# Patient Record
Sex: Male | Born: 1935 | Race: White | Hispanic: No | Marital: Married | Smoking: Former smoker
Health system: Southern US, Community
[De-identification: ages and names within clinical notes are randomized; demographics above are authoritative.]

## PROBLEM LIST (undated history)

## (undated) DIAGNOSIS — R7303 Prediabetes: Secondary | ICD-10-CM

## (undated) DIAGNOSIS — I701 Atherosclerosis of renal artery: Secondary | ICD-10-CM

## (undated) DIAGNOSIS — Z8673 Personal history of transient ischemic attack (TIA), and cerebral infarction without residual deficits: Secondary | ICD-10-CM

## (undated) DIAGNOSIS — I2510 Atherosclerotic heart disease of native coronary artery without angina pectoris: Secondary | ICD-10-CM

## (undated) DIAGNOSIS — Z95810 Presence of automatic (implantable) cardiac defibrillator: Secondary | ICD-10-CM

## (undated) DIAGNOSIS — I428 Other cardiomyopathies: Secondary | ICD-10-CM

## (undated) DIAGNOSIS — J9690 Respiratory failure, unspecified, unspecified whether with hypoxia or hypercapnia: Secondary | ICD-10-CM

## (undated) DIAGNOSIS — N184 Chronic kidney disease, stage 4 (severe): Secondary | ICD-10-CM

## (undated) DIAGNOSIS — G459 Transient cerebral ischemic attack, unspecified: Secondary | ICD-10-CM

## (undated) DIAGNOSIS — G4733 Obstructive sleep apnea (adult) (pediatric): Secondary | ICD-10-CM

## (undated) DIAGNOSIS — I447 Left bundle-branch block, unspecified: Secondary | ICD-10-CM

## (undated) DIAGNOSIS — E785 Hyperlipidemia, unspecified: Secondary | ICD-10-CM

## (undated) DIAGNOSIS — I779 Disorder of arteries and arterioles, unspecified: Secondary | ICD-10-CM

## (undated) DIAGNOSIS — I739 Peripheral vascular disease, unspecified: Secondary | ICD-10-CM

## (undated) DIAGNOSIS — I5042 Chronic combined systolic (congestive) and diastolic (congestive) heart failure: Secondary | ICD-10-CM

## (undated) DIAGNOSIS — I110 Hypertensive heart disease with heart failure: Secondary | ICD-10-CM

## (undated) HISTORY — DX: Left bundle-branch block, unspecified: I44.7

## (undated) HISTORY — DX: Hyperlipidemia, unspecified: E78.5

## (undated) HISTORY — DX: Disorder of arteries and arterioles, unspecified: I77.9

## (undated) HISTORY — PX: HERNIA REPAIR: SHX51

## (undated) HISTORY — DX: Other cardiomyopathies: I42.8

## (undated) HISTORY — DX: Personal history of transient ischemic attack (TIA), and cerebral infarction without residual deficits: Z86.73

## (undated) HISTORY — DX: Atherosclerosis of renal artery: I70.1

## (undated) HISTORY — DX: Presence of automatic (implantable) cardiac defibrillator: Z95.810

## (undated) HISTORY — DX: Atherosclerotic heart disease of native coronary artery without angina pectoris: I25.10

## (undated) HISTORY — DX: Obstructive sleep apnea (adult) (pediatric): G47.33

## (undated) HISTORY — DX: Peripheral vascular disease, unspecified: I73.9

---

## 1995-01-09 HISTORY — PX: HAND SURGERY: SHX662

## 2001-05-10 HISTORY — PX: CARDIAC CATHETERIZATION: SHX172

## 2001-11-19 ENCOUNTER — Inpatient Hospital Stay (HOSPITAL_COMMUNITY): Admission: AD | Admit: 2001-11-19 | Discharge: 2001-11-21 | Payer: Self-pay | Admitting: Cardiovascular Disease

## 2001-12-21 ENCOUNTER — Encounter: Payer: Self-pay | Admitting: Cardiovascular Disease

## 2001-12-21 ENCOUNTER — Ambulatory Visit (HOSPITAL_COMMUNITY): Admission: RE | Admit: 2001-12-21 | Discharge: 2001-12-22 | Payer: Self-pay | Admitting: Cardiovascular Disease

## 2005-10-12 ENCOUNTER — Encounter: Admission: RE | Admit: 2005-10-12 | Discharge: 2005-10-12 | Payer: Self-pay | Admitting: Cardiovascular Disease

## 2005-10-18 ENCOUNTER — Inpatient Hospital Stay (HOSPITAL_COMMUNITY): Admission: RE | Admit: 2005-10-18 | Discharge: 2005-10-19 | Payer: Self-pay | Admitting: Cardiovascular Disease

## 2005-10-18 HISTORY — PX: CARDIAC CATHETERIZATION: SHX172

## 2010-06-10 DIAGNOSIS — G459 Transient cerebral ischemic attack, unspecified: Secondary | ICD-10-CM

## 2010-06-10 HISTORY — DX: Transient cerebral ischemic attack, unspecified: G45.9

## 2011-04-22 DIAGNOSIS — M109 Gout, unspecified: Secondary | ICD-10-CM | POA: Insufficient documentation

## 2012-09-12 DIAGNOSIS — G4700 Insomnia, unspecified: Secondary | ICD-10-CM | POA: Insufficient documentation

## 2012-09-25 DIAGNOSIS — I129 Hypertensive chronic kidney disease with stage 1 through stage 4 chronic kidney disease, or unspecified chronic kidney disease: Secondary | ICD-10-CM | POA: Insufficient documentation

## 2012-11-06 ENCOUNTER — Other Ambulatory Visit (HOSPITAL_COMMUNITY): Payer: Self-pay | Admitting: Cardiovascular Disease

## 2012-11-06 ENCOUNTER — Telehealth (HOSPITAL_COMMUNITY): Payer: Self-pay | Admitting: Cardiovascular Disease

## 2012-11-06 DIAGNOSIS — I701 Atherosclerosis of renal artery: Secondary | ICD-10-CM

## 2012-11-06 NOTE — Telephone Encounter (Signed)
LEFT MESSAGE WITH PATIENTS WIFE TO CALL BACK AND SCHEDULE Q 6 TESTING PER JB

## 2012-11-08 ENCOUNTER — Encounter: Payer: Self-pay | Admitting: Cardiovascular Disease

## 2012-11-27 ENCOUNTER — Encounter (HOSPITAL_COMMUNITY): Payer: Self-pay

## 2012-12-04 ENCOUNTER — Telehealth (HOSPITAL_COMMUNITY): Payer: Self-pay | Admitting: Cardiovascular Disease

## 2012-12-05 ENCOUNTER — Encounter: Payer: Self-pay | Admitting: Cardiovascular Disease

## 2012-12-14 ENCOUNTER — Ambulatory Visit: Payer: Self-pay | Admitting: Cardiovascular Disease

## 2012-12-15 ENCOUNTER — Ambulatory Visit (HOSPITAL_COMMUNITY)
Admission: RE | Admit: 2012-12-15 | Discharge: 2012-12-15 | Disposition: A | Discharge status: Home / Self Care | Payer: Medicare Other | Source: Ambulatory Visit | Attending: Cardiovascular Disease | Admitting: Cardiovascular Disease

## 2012-12-15 DIAGNOSIS — I701 Atherosclerosis of renal artery: Secondary | ICD-10-CM

## 2012-12-15 NOTE — Progress Notes (Signed)
Renal Artery Duplex Completed. °Scott Parsons ° °

## 2012-12-21 ENCOUNTER — Encounter: Payer: Self-pay | Admitting: *Deleted

## 2012-12-25 ENCOUNTER — Ambulatory Visit: Payer: Self-pay | Admitting: Cardiovascular Disease

## 2013-01-08 DIAGNOSIS — Z8673 Personal history of transient ischemic attack (TIA), and cerebral infarction without residual deficits: Secondary | ICD-10-CM | POA: Insufficient documentation

## 2013-01-08 DIAGNOSIS — E1159 Type 2 diabetes mellitus with other circulatory complications: Secondary | ICD-10-CM | POA: Insufficient documentation

## 2013-01-09 ENCOUNTER — Encounter: Payer: Self-pay | Admitting: Cardiovascular Disease

## 2013-01-10 ENCOUNTER — Ambulatory Visit (INDEPENDENT_AMBULATORY_CARE_PROVIDER_SITE_OTHER): Payer: Medicare Other | Admitting: Cardiovascular Disease

## 2013-01-10 ENCOUNTER — Encounter: Payer: Self-pay | Admitting: Cardiovascular Disease

## 2013-01-10 VITALS — BP 128/52 | HR 62 | Ht 74.0 in | Wt 200.9 lb

## 2013-01-10 DIAGNOSIS — I739 Peripheral vascular disease, unspecified: Secondary | ICD-10-CM

## 2013-01-10 DIAGNOSIS — I779 Disorder of arteries and arterioles, unspecified: Secondary | ICD-10-CM | POA: Insufficient documentation

## 2013-01-10 DIAGNOSIS — I5022 Chronic systolic (congestive) heart failure: Secondary | ICD-10-CM | POA: Insufficient documentation

## 2013-01-10 DIAGNOSIS — E785 Hyperlipidemia, unspecified: Secondary | ICD-10-CM | POA: Insufficient documentation

## 2013-01-10 DIAGNOSIS — I509 Heart failure, unspecified: Secondary | ICD-10-CM

## 2013-01-10 DIAGNOSIS — I701 Atherosclerosis of renal artery: Secondary | ICD-10-CM

## 2013-01-10 DIAGNOSIS — I10 Essential (primary) hypertension: Secondary | ICD-10-CM | POA: Insufficient documentation

## 2013-01-10 NOTE — Assessment & Plan Note (Signed)
Status post bilateral iliac artery stenting. He does have mild claudication. We checked his lower Shumate Dopplers angle basis which were last checked 11/08/11 revealing a right ABI of 0.89 the left of 0.79 with moderately high velocities in both iliac arteries. He does have moderate segmental mid SFA stenosis bilaterally. Will recheck largely Doppler studies

## 2013-01-10 NOTE — Assessment & Plan Note (Signed)
I stented his left renal artery 10/18/05. We've been following his renal Dopplers an annual basis. They were recently checked on 12/15/12 revealing a progression of disease on the left side. We will recheck in 6 months.

## 2013-01-10 NOTE — Progress Notes (Signed)
01/10/2013 Scott Parsons   1929/09/04  [id number]  Primary Physician Scott Qua, MD Primary Cardiologist: Scott Gess MD Scott Parsons   HPI:  The patient is a very pleasant 79 year old, mildly overweight, married Caucasian male, father of 3, grandfather to 7 grandchildren who I saw in the office 1 year ago. He has a history of noncritical CAD by cath which I performed in 2003 with mild LV dysfunction. At that time, he had apical and inferoapical wall motion abnormalities and EF of 45%. He has PVOD with bilateral carotid disease left greater than right which we follow by duplex ultrasound. He is neurologically asymptomatic. I stented his left renal artery October 18, 2005, as well as both iliac arteries. He does have moderate SFA disease bilaterally. He does complain of some hip pain when walking on a treadmill or walking up an incline, but rides a bike without limitation. His other problems include hypertension and hyperlipidemia. His last Myoview performed December 08, 2009, revealed inferior scar. Dr. Mikey Parsons follows his lipid profile. His most recent Dopplers reveal stable internal carotid artery stenosis left greater than right, as well as patent iliac stents with probably mild to moderate in-stent restenosis with ABIs of 0.89 on the right and 0.79 on the left.  Saw him last a/2/13 he has had admissions for congestive heart failure at [facility] in March of this year probably related to dietary indiscretion. He spent today's in the hospital I was direst. He had recent renal Doppler studies that showed progression of disease on left side suggesting "in-stent restenosis. He denies chest pain or shortness of breath now. Does have mild stable claudication.      Current Outpatient Prescriptions  Medication Sig Dispense Refill  . Aclidinium Bromide (TUDORZA PRESSAIR) 400 MCG/ACT AEPB Inhale 1 puff into the lungs daily. Sometimes takes BID      . allopurinol (ZYLOPRIM) 100 MG  tablet Take 100 mg by mouth daily.      [name] aspirin 81 MG tablet Take 81 mg by mouth daily.      . candesartan (ATACAND) 16 MG tablet Take 1 tablet by mouth daily.      . clopidogrel (PLAVIX) 75 MG tablet Take 75 mg by mouth daily.      . furosemide (LASIX) 40 MG tablet Take 60 mg by mouth daily.       . isosorbide dinitrate (ISORDIL) 10 MG tablet Take 20 mg by mouth daily.       [name] lisinopril (PRINIVIL,ZESTRIL) 40 MG tablet Take 40 mg by mouth daily.      . Magnesium 500 MG CAPS Take 1 capsule by mouth daily.      . metoprolol (TOPROL-XL) 200 MG 24 hr tablet Take 1 tablet by mouth daily.      . niacin 500 MG tablet Take 500 mg by mouth daily with breakfast.      . pravastatin (PRAVACHOL) 40 MG tablet Take 40 mg by mouth daily.      [name] zolpidem (AMBIEN) 10 MG tablet Take 5 mg by mouth daily.       No current facility-administered medications for this visit.    Allergies  Allergen Reactions  . Amoxicillin   . Biaxin [Clarithromycin]   . Erythromycin   . Penicillins   . Shellfish Allergy     History   Social History  . Marital Status: Married    Spouse Name: N/A    Number of Children: N/A  . Years of Education: N/A   Occupational  History  . Not on file.   Social History Main Topics  . Smoking status: Former Smoker -- 52 years    Types: Cigarettes    Quit date: 08/11/2004  . Smokeless tobacco: Never Used  . Alcohol Use: No  . Drug Use: No  . Sexual Activity: Not on file   Other Topics Concern  . Not on file   Social History Narrative  . No narrative on file     Review of Systems: General: negative for chills, fever, night sweats or weight changes.  Cardiovascular: negative for chest pain, dyspnea on exertion, edema, orthopnea, palpitations, paroxysmal nocturnal dyspnea or shortness of breath Dermatological: negative for rash Respiratory: negative for cough or wheezing Urologic: negative for hematuria Abdominal: negative for nausea, vomiting, diarrhea, bright red  blood per rectum, melena, or hematemesis Neurologic: negative for visual changes, syncope, or dizziness All other systems reviewed and are otherwise negative except as noted above.    Blood pressure 128/52, pulse 62, height 6\' 2"  (1.88 m), weight 200 lb 14.4 oz (91.128 kg).  General appearance: alert and no distress Neck: no adenopathy, no JVD, supple, symmetrical, trachea midline, thyroid not enlarged, symmetric, no tenderness/mass/nodules and ssoft right carotid bruit Lungs: clear to auscultation bilaterally Heart: regular rate and rhythm, S1, S2 normal, no murmur, click, rub or gallop Extremities: extremities normal, atraumatic, no cyanosis or edema  EKG sinus rhythm at 62 with a nonspecific IVCD and occasional PVCs with repolarization abnormalities unchanged from his prior EKG  ASSESSMENT AND PLAN:   Congestive heart failure The patient was in the [facility] in March with congestive heart failure and was diuresed. He does have a history of moderate left ventricular dysfunction by 2-D echo last checked 9/11/6 with an EF of 35-45%.he does admit to dietary indiscretion regarding salt.  Renal artery stenosis I stented his left renal artery 10/18/05. We've been following his renal Dopplers an annual basis. They were recently checked on 12/15/12 revealing a progression of disease on the left side. We will recheck in 6 months.  Peripheral arterial disease Status post bilateral iliac artery stenting. He does have mild claudication. We checked his lower Shumate Dopplers angle basis which were last checked 11/08/11 revealing a right ABI of 0.89 the left of 0.79 with moderately high velocities in both iliac arteries. He does have moderate segmental mid SFA stenosis bilaterally. Will recheck largely Doppler studies  Carotid artery disease Patient says that he had a TIA February of last year. His last Dopplers performed 11/08/11 revealed mild right and mild to moderate left ICA stenosis. He is on  aspirin and clopidogrel.      Scott Gess MD FACP,FACC,FAHA, [facility] 01/10/2013 5:23 PM

## 2013-01-10 NOTE — Assessment & Plan Note (Signed)
The patient was in the [facility] in March with congestive heart failure and was diuresed. He does have a history of moderate left ventricular dysfunction by 2-D echo last checked 9/11/6 with an EF of 35-45%.he does admit to dietary indiscretion regarding salt.

## 2013-01-10 NOTE — Assessment & Plan Note (Signed)
Patient says that he had a TIA February of last year. His last Dopplers performed 11/08/11 revealed mild right and mild to moderate left ICA stenosis. He is on aspirin and clopidogrel.

## 2013-01-10 NOTE — Patient Instructions (Addendum)
  We will see you back in follow up in 6 months  Dr Allyson Sabal has ordered echocardiogram, carotid doppler, lower extremity arterial doppler in the next 1-2 months  Please have renal dopplers in 6 months.

## 2013-01-11 ENCOUNTER — Telehealth (HOSPITAL_COMMUNITY): Payer: Self-pay | Admitting: Cardiovascular Disease

## 2013-01-15 ENCOUNTER — Encounter (HOSPITAL_COMMUNITY): Payer: Self-pay | Admitting: Cardiovascular Disease

## 2013-01-17 ENCOUNTER — Ambulatory Visit (HOSPITAL_BASED_OUTPATIENT_CLINIC_OR_DEPARTMENT_OTHER)
Admission: RE | Admit: 2013-01-17 | Discharge: 2013-01-17 | Disposition: A | Discharge status: Home / Self Care | Payer: Medicare Other | Source: Ambulatory Visit | Attending: Cardiovascular Disease | Admitting: Cardiovascular Disease

## 2013-01-17 ENCOUNTER — Ambulatory Visit (HOSPITAL_COMMUNITY)
Admission: RE | Admit: 2013-01-17 | Discharge: 2013-01-17 | Disposition: A | Discharge status: Home / Self Care | Payer: Medicare Other | Source: Ambulatory Visit | Attending: Cardiovascular Disease | Admitting: Cardiovascular Disease

## 2013-01-17 DIAGNOSIS — I779 Disorder of arteries and arterioles, unspecified: Secondary | ICD-10-CM

## 2013-01-17 DIAGNOSIS — I739 Peripheral vascular disease, unspecified: Secondary | ICD-10-CM

## 2013-01-17 DIAGNOSIS — I509 Heart failure, unspecified: Secondary | ICD-10-CM | POA: Insufficient documentation

## 2013-01-17 NOTE — Progress Notes (Signed)
Carotid Duplex Completed. °Brianna L Mazza,RVT °

## 2013-01-17 NOTE — Progress Notes (Signed)
2D Echo Performed 01/17/2013    Izzy Courville, RCS  

## 2013-01-25 ENCOUNTER — Ambulatory Visit (HOSPITAL_COMMUNITY)
Admission: RE | Admit: 2013-01-25 | Discharge: 2013-01-25 | Disposition: A | Discharge status: Home / Self Care | Payer: Medicare Other | Source: Ambulatory Visit | Attending: Cardiovascular Disease | Admitting: Cardiovascular Disease

## 2013-01-25 DIAGNOSIS — I70219 Atherosclerosis of native arteries of extremities with intermittent claudication, unspecified extremity: Secondary | ICD-10-CM

## 2013-01-25 DIAGNOSIS — I739 Peripheral vascular disease, unspecified: Secondary | ICD-10-CM

## 2013-01-25 NOTE — Progress Notes (Signed)
Arterial Duplex Lower Ext. Completed. Leathie Weich, BS, RDMS, RVT  

## 2013-02-02 ENCOUNTER — Telehealth: Payer: Self-pay | Admitting: Cardiovascular Disease

## 2013-02-02 DIAGNOSIS — I6529 Occlusion and stenosis of unspecified carotid artery: Secondary | ICD-10-CM

## 2013-02-02 NOTE — Telephone Encounter (Signed)
Order placed for repeat carotid doppler in 1 year 

## 2013-02-02 NOTE — Telephone Encounter (Signed)
Patient is returning your call from yesterday.  Please call new phone # that was given.

## 2013-02-02 NOTE — Telephone Encounter (Signed)
Message copied by Marella Bile on Fri Feb 02, 2013  3:51 PM ------      Message from: [name]      Created: Sun Jan 28, 2013 11:10 AM       No change from prior study. Repeat in 12 months. ------

## 2013-02-02 NOTE — Telephone Encounter (Signed)
Spoke with patient and gave test results

## 2013-02-05 ENCOUNTER — Encounter: Payer: Self-pay | Admitting: Cardiovascular Disease

## 2013-02-05 ENCOUNTER — Ambulatory Visit (INDEPENDENT_AMBULATORY_CARE_PROVIDER_SITE_OTHER): Payer: Medicare Other | Admitting: Cardiovascular Disease

## 2013-02-05 VITALS — BP 130/60 | HR 72 | Ht 74.0 in | Wt 204.0 lb

## 2013-02-05 DIAGNOSIS — I739 Peripheral vascular disease, unspecified: Secondary | ICD-10-CM

## 2013-02-05 DIAGNOSIS — I701 Atherosclerosis of renal artery: Secondary | ICD-10-CM

## 2013-02-05 DIAGNOSIS — I5022 Chronic systolic (congestive) heart failure: Secondary | ICD-10-CM

## 2013-02-05 DIAGNOSIS — I509 Heart failure, unspecified: Secondary | ICD-10-CM

## 2013-02-05 NOTE — Assessment & Plan Note (Signed)
A 2-D echocardiogram performed 01/17/13 revealed an EF of 25-30% with a moderately dilated left ventricle. 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 He may benefit from talking to Dr. Royann Shivers for discussion about ICD therapy for primary prevention versus by the ICD since his QRS complex is widened. I will see him back in 6 months.

## 2013-02-05 NOTE — Patient Instructions (Signed)
Your physician wants you to follow-up in: 6 months with Dr Allyson Sabal.  You will receive a reminder letter in the mail two months in advance. If you don't receive a letter, please call our office to schedule the follow-up appointment.  We would like to make an appointment with Dr Royann Shivers to discuss device placement (possible ICD).   Dr Allyson Sabal has ordered lower extremity dopplers to be done in the near future and renal dopplers to be done in 6 months.

## 2013-02-05 NOTE — Assessment & Plan Note (Signed)
I stented his left renal artery in 2007. He had renal Dopplers performed one year ago that showed a left renal artery ratio of 3.2. Renal Dopplers performed on 12/15/12 revealed a left renal artery ratio 4.72 with systolic velocity of 143 cm/s suggesting "in-stent restenosis. I'm going to repeat this in 6 months and should this show continued progression we'll consider renal intervention for renal renal salvage

## 2013-02-05 NOTE — Progress Notes (Signed)
02/05/2013 Scott Parsons   [birth year]  [id number]  Primary Physician Scott Qua, MD Primary Cardiologist: Scott Gess MD Scott Parsons   HPI:  The patient is a very pleasant 79 year old, mildly overweight, married Caucasian male, father of 3, grandfather to 7 grandchildren who I saw in the office 1 year ago. He has a history of noncritical CAD by cath which I performed in 2003 with mild LV dysfunction. At that time, he had apical and inferoapical wall motion abnormalities and EF of 45%. He has PVOD with bilateral carotid disease left greater than right which we follow by duplex ultrasound. He is neurologically asymptomatic. I stented his left renal artery October 18, 2005, as well as both iliac arteries. He does have moderate SFA disease bilaterally. He does complain of some hip pain when walking on a treadmill or walking up an incline, but rides a bike without limitation. His other problems include hypertension and hyperlipidemia. His last Myoview performed December 08, 2009, revealed inferior scar. Dr. Mikey Parsons follows his lipid profile. His most recent Dopplers reveal stable internal carotid artery stenosis left greater than right, as well as patent iliac stents with probably mild to moderate in-stent restenosis with ABIs of 0.89 on the right and 0.79 on the left.  Saw him last a/2/13 he has had admissions for congestive heart failure at [facility] in March of this year probably related to dietary indiscretion. He spent today's in the hospital I was direst. He had recent renal Doppler studies that showed progression of disease on left side suggesting "in-stent restenosis. He denies chest pain or shortness of breath now. Does have lifestyle limiting claudication. I formed a 2-D echocardiogram on him 09/16/12 revealing an EF of 25-30% with a dilated left ventricle.in addition, renal Doppler suggest progression of left renal artery stenosis suggesting "in-stent restenosis who left renal  aortic ratio of 4.72.    Current Outpatient Prescriptions  Medication Sig Dispense Refill  . Aclidinium Bromide (TUDORZA PRESSAIR) 400 MCG/ACT AEPB Inhale 1 puff into the lungs daily. Sometimes takes BID      . allopurinol (ZYLOPRIM) 100 MG tablet Take 100 mg by mouth daily.      [name] aspirin 81 MG tablet Take 81 mg by mouth daily.      . candesartan (ATACAND) 16 MG tablet Take 1 tablet by mouth daily.      . clopidogrel (PLAVIX) 75 MG tablet Take 75 mg by mouth daily.      . furosemide (LASIX) 40 MG tablet Take 60 mg by mouth daily.       . isosorbide dinitrate (ISORDIL) 10 MG tablet Take 20 mg by mouth daily.       [name] lisinopril (PRINIVIL,ZESTRIL) 40 MG tablet Take 40 mg by mouth daily.      . Magnesium 500 MG CAPS Take 1 capsule by mouth daily.      . metoprolol (TOPROL-XL) 200 MG 24 hr tablet Take 1 tablet by mouth daily.      . niacin 500 MG tablet Take 500 mg by mouth daily with breakfast.      . pravastatin (PRAVACHOL) 40 MG tablet Take 40 mg by mouth daily.      [name] zolpidem (AMBIEN) 10 MG tablet Take 5 mg by mouth daily.       No current facility-administered medications for this visit.    Allergies  Allergen Reactions  . Amoxicillin   . Biaxin [Clarithromycin]   . Erythromycin   . Penicillins   . Shellfish Allergy  History   Social History  . Marital Status: Married    Spouse Name: N/A    Number of Children: N/A  . Years of Education: N/A   Occupational History  . Not on file.   Social History Main Topics  . Smoking status: Former Smoker -- 52 years    Types: Cigarettes    Quit date: 08/11/2004  . Smokeless tobacco: Never Used  . Alcohol Use: No  . Drug Use: No  . Sexual Activity: Not on file   Other Topics Concern  . Not on file   Social History Narrative  . No narrative on file     Review of Systems: General: negative for chills, fever, night sweats or weight changes.  Cardiovascular: negative for chest pain, dyspnea on exertion, edema, orthopnea,  palpitations, paroxysmal nocturnal dyspnea or shortness of breath Dermatological: negative for rash Respiratory: negative for cough or wheezing Urologic: negative for hematuria Abdominal: negative for nausea, vomiting, diarrhea, bright red blood per rectum, melena, or hematemesis Neurologic: negative for visual changes, syncope, or dizziness All other systems reviewed and are otherwise negative except as noted above.    Blood pressure 130/60, pulse 72, height 6\' 2"  (1.88 m), weight 204 lb (92.534 kg).  General appearance: alert and no distress Neck: no adenopathy, no carotid bruit, no JVD, supple, symmetrical, trachea midline and thyroid not enlarged, symmetric, no tenderness/mass/nodules Lungs: clear to auscultation bilaterally Heart: regular rate and rhythm, S1, S2 normal, no murmur, click, rub or gallop Extremities: extremities normal, atraumatic, no cyanosis or edema  EKG not performed today  ASSESSMENT AND PLAN:   Congestive heart failure A 2-D echocardiogram performed 01/17/13 revealed an EF of 25-30% with a moderately dilated left ventricle. 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 He may benefit from talking to Dr. Royann Parsons for discussion about ICD therapy for primary prevention versus by the ICD since his QRS complex is widened. I will see him back in 6 months.  Peripheral arterial disease He has bilateral iliac artery stent for complaints of buttock and hip claudication which is lifestyle limiting. His left lower the Doppler study was over one year ago. Underwent a repeat lower extremity arterial Doppler studies.  Renal artery stenosis I stented his left renal artery in 2007. He had renal Dopplers performed one year ago that showed a left renal artery ratio of 3.2. Renal Dopplers performed on 12/15/12 revealed a left renal artery ratio 4.72 with systolic velocity of 143 cm/s suggesting "in-stent restenosis. I'm going to repeat this in 6 months and  should this show continued progression we'll consider renal intervention for renal renal salvage      Scott Gess MD [facility], [facility] 02/05/2013 3:38 PM

## 2013-02-05 NOTE — Assessment & Plan Note (Signed)
He has bilateral iliac artery stent for complaints of buttock and hip claudication which is lifestyle limiting. His left lower the Doppler study was over one year ago. Underwent a repeat lower extremity arterial Doppler studies.

## 2013-02-15 ENCOUNTER — Encounter: Payer: Self-pay | Admitting: *Deleted

## 2013-02-21 ENCOUNTER — Telehealth: Payer: Self-pay | Admitting: Cardiovascular Disease

## 2013-02-21 NOTE — Telephone Encounter (Signed)
Returned call and pt verified x 2.  Pt stated he was concerned the letter from Burna Mortimer was to cancel appt for test on Friday.  Pt informed letter was giving results from previous test on his legs as Samara Deist was out of the office and unable to send results.  Pt verbalized understanding.  After talking w/ pt, RN reviewed chart.  Pt had lower extremity doppler done on 9.18.14 and is scheduled for repeat on Friday along w/ renal doppler.  After further review, pt is supposed to have renal doppler done in March 2015.  It appears Dr. Allyson Sabal wanted to repeat lower extremity doppler soon after pt seen on 9.29.14.  Will defer to Samara Deist, RN for further review to ensure it is appropriate to repeat testing on Friday.  Message forwarded to K. Petra Kuba, Charity fundraiser.

## 2013-02-21 NOTE — Telephone Encounter (Signed)
Please call-concerning his letter he received in the mail about his test results.The letter had [employer] on it.

## 2013-02-21 NOTE — Telephone Encounter (Signed)
I spoke with patient.  He does not need renal dopplers at this time or lower ext dopplers currently.  Appts were cancelled.

## 2013-02-23 ENCOUNTER — Encounter (HOSPITAL_COMMUNITY): Payer: Medicare Other

## 2013-04-20 ENCOUNTER — Ambulatory Visit (INDEPENDENT_AMBULATORY_CARE_PROVIDER_SITE_OTHER): Payer: Medicare Other | Admitting: Cardiovascular Disease

## 2013-04-20 VITALS — BP 100/60 | HR 82 | Ht 74.0 in | Wt 210.0 lb

## 2013-04-20 DIAGNOSIS — I509 Heart failure, unspecified: Secondary | ICD-10-CM

## 2013-04-20 NOTE — Patient Instructions (Addendum)
Your physician recommends that you schedule a follow-up appointment in: 6 months We are scheduleing an appt.with a Cardiologist who specializes in Bi- ventricular pacemakers

## 2013-05-06 ENCOUNTER — Encounter: Payer: Self-pay | Admitting: Cardiovascular Disease

## 2013-05-06 NOTE — Assessment & Plan Note (Addendum)
He meets criteria for both ICD and CRT . We discussed the purpose of each of these interventions and their effect on quality of life and length of life, respectively. He considers himself to be active, socially engaged and looking forward to many years of an active lifestyle. I told him that CRT may offer definite benefits, whereas ICD in an octogenarian is of less definite benefit. Will refer him to EP, but gave him literature to review. My impression is that he wants the full CRT-D device. Whether or not he should have repeat evaluation for CAD remains an open question - will defer to Dr. Allyson Sabal and the EP consultant.

## 2013-05-06 NOTE — Progress Notes (Signed)
Patient ID: Scott Parsons, male   DOB: 06-29-1929, 79 y.o.   MRN: [id number]     Reason for office visit Discuss ICD  Scott Parsons is an 17 year patent of Dr. Allyson Sabal referred to discuss advanced device therapy for CHF due to severe cardiomyopathy, LVEF 25-30% with combined sytsolic and diastolic heart failure NYHA class II dyspnea.  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 No recent evaluation has been performed for CAD and he has extensive evidence of PAD.  He is non ACEi and ARB therapy as well as high dose beta blockers. He requires only a small dose of loop diuretic. ECG shows LBBB and QRS duration >150 ms.   Allergies  Allergen Reactions  . Amoxicillin   . Biaxin [Clarithromycin]   . Erythromycin   . Penicillins   . Shellfish Allergy     Current Outpatient Prescriptions  Medication Sig Dispense Refill  . Aclidinium Bromide (TUDORZA PRESSAIR) 400 MCG/ACT AEPB Inhale 1 puff into the lungs daily. Sometimes takes BID      . allopurinol (ZYLOPRIM) 100 MG tablet Take 100 mg by mouth daily.      [name] aspirin 81 MG tablet Take 81 mg by mouth daily.      . candesartan (ATACAND) 16 MG tablet Take 1 tablet by mouth daily.      . clopidogrel (PLAVIX) 75 MG tablet Take 75 mg by mouth daily.      . furosemide (LASIX) 40 MG tablet Take 40 mg by mouth. Take 1 1/2 tablets daily      . isosorbide mononitrate (IMDUR) 30 MG 24 hr tablet Take 30 mg by mouth daily.      [name] lisinopril (PRINIVIL,ZESTRIL) 40 MG tablet Take 40 mg by mouth daily.      . Magnesium 500 MG CAPS Take 1 capsule by mouth daily.      . metoprolol (TOPROL-XL) 200 MG 24 hr tablet Take 1 tablet by mouth daily.      . niacin 500 MG tablet Take 500 mg by mouth daily with breakfast.      . pravastatin (PRAVACHOL) 40 MG tablet Take 40 mg by mouth daily.      [name] PROAIR HFA 108 (90 BASE) MCG/ACT inhaler       . zolpidem (AMBIEN) 10 MG tablet Take 5 mg  by mouth daily.       No current facility-administered medications for this visit.    Past Medical History  Diagnosis Date  . CAD (coronary artery disease)   . Hypertension   . Hyperlipidemia   . History of stress test 12/08/2009    revealed inferior scar.  [name] Hx of Doppler ultrasound     reveal stable internal carotid artery stenosis left greater than right, as well as patent iliac stents with probably mild to moderate in-stent restenosis with ABIs of 0.89 on the right and 0.79 on the left.  [name] Hx of echocardiogram 01/18/2005    EF 35-45% with mild MR and TR and his last functional study performed 8/05 revealed inferior apical and lateral scar with LV dilation.  . OSA (obstructive sleep apnea)     On C-pap  . PVD (peripheral vascular disease)     with right external iliac artery angioplasty and left renal artery angioplasty and stenting.  . Renal artery stenosis   . History of renal stent   . History of stroke   .  Carotid artery disease   . Congestive heart failure     Past Surgical History  Procedure Laterality Date  . Cardiac catheterization  2003    Mild LV dysfuntion. he had apical and inferoapical wall motion abnormalities and EF of 45%.  . Cardiac catheterization  10/18/2005    STENTS: left renal artery as well as both iliac arteries. Was stented with 12mm x 4mm Smart Nitinol self-expanding stent replaced with a 9mm x 4mm balloon at nominal pressures, resulting in a reduction od 50% proximal right external iliac artery  stenosis to 0% residual.  . Hand surgery  01/1995    Family History  Problem Relation Age of Onset  . Alzheimer's disease Mother   . Heart disease Father   . Heart disease Paternal Grandfather     History   Social History  . Marital Status: Married    Spouse Name: N/A    Number of Children: N/A  . Years of Education: N/A   Occupational History  . Not on file.   Social History Main Topics  . Smoking status: Former Smoker -- 52 years     Types: Cigarettes    Quit date: 08/11/2004  . Smokeless tobacco: Never Used  . Alcohol Use: No  . Drug Use: No  . Sexual Activity: Not on file   Other Topics Concern  . Not on file   Social History Narrative  . No narrative on file    Review of systems: The patient specifically denies any chest pain at rest or with exertion, dyspnea at rest or with usual exertion, orthopnea, paroxysmal nocturnal dyspnea, syncope, palpitations, focal neurological deficits, intermittent claudication, lower extremity edema, unexplained weight gain, cough, hemoptysis or wheezing.  The patient also denies abdominal pain, nausea, vomiting, dysphagia, diarrhea, constipation, polyuria, polydipsia, dysuria, hematuria, frequency, urgency, abnormal bleeding or bruising, fever, chills, unexpected weight changes, mood swings, change in skin or hair texture, change in voice quality, auditory or visual problems, allergic reactions or rashes, new musculoskeletal complaints other than usual "aches and pains".   PHYSICAL EXAM BP 100/60  Pulse 82  Ht 6\' 2"  (1.88 m)  Wt 210 lb (95.255 kg)  BMI 26.95 kg/m2  General: Alert, oriented x3, no distress Head: no evidence of trauma, PERRL, EOMI, no exophtalmos or lid lag, no myxedema, no xanthelasma; normal ears, nose and oropharynx Neck: normal jugular venous pulsations and no hepatojugular reflux; brisk carotid pulses without delay and no carotid bruits Chest: clear to auscultation, no signs of consolidation by percussion or palpation, normal fremitus, symmetrical and full respiratory excursions Cardiovascular: normal position and quality of the apical impulse, regular rhythm, normal first and paradoxically split second heart sounds, no murmurs, rubs or gallops Abdomen: no tenderness or distention, no masses by palpation, no abnormal pulsatility or arterial bruits, normal bowel sounds, no hepatosplenomegaly Extremities: no clubbing, cyanosis or edema; 2+ radial, ulnar and  brachial pulses bilaterally; 2+ right femoral, posterior tibial and dorsalis pedis pulses; 2+ left femoral, posterior tibial and dorsalis pedis pulses; no subclavian or femoral bruits Neurological: grossly nonfocal   EKG:  LBBB and QRS duration >150 ms.  ASSESSMENT AND PLAN No problem-specific assessment & plan notes found for this encounter.  Patient Instructions  Your physician recommends that you schedule a follow-up appointment in: 6 months We are scheduleing an appt.with a Cardiologist who specializes in Bi- ventricular pacemakers    Orders Placed This Encounter  Procedures  . Ambulatory referral to Cardiac Electrophysiology   Meds ordered this encounter  Medications  .  PROAIR HFA 108 (90 BASE) MCG/ACT inhaler    Sig:   . DISCONTD: isosorbide mononitrate (ISMO,MONOKET) 20 MG tablet    Sig:   . isosorbide mononitrate (IMDUR) 30 MG 24 hr tablet    Sig: Take 30 mg by mouth daily.  . furosemide (LASIX) 40 MG tablet    Sig: Take 40 mg by mouth. Take 1 1/2 tablets daily    Canna Nickelson  Thurmon Fair, MD, [facility] [phone] office [phone] pager

## 2013-07-10 ENCOUNTER — Ambulatory Visit (HOSPITAL_COMMUNITY)
Admission: RE | Admit: 2013-07-10 | Discharge: 2013-07-10 | Disposition: A | Discharge status: Home / Self Care | Payer: Medicare Other | Source: Ambulatory Visit | Attending: Cardiovascular Disease | Admitting: Cardiovascular Disease

## 2013-07-10 DIAGNOSIS — I10 Essential (primary) hypertension: Secondary | ICD-10-CM

## 2013-07-10 DIAGNOSIS — I701 Atherosclerosis of renal artery: Secondary | ICD-10-CM

## 2013-07-10 DIAGNOSIS — I739 Peripheral vascular disease, unspecified: Secondary | ICD-10-CM | POA: Insufficient documentation

## 2013-07-10 NOTE — Progress Notes (Signed)
Renal Duplex Completed. Kayleena Eke, BS, RDMS, RVT  

## 2013-07-11 ENCOUNTER — Encounter: Payer: Self-pay | Admitting: Cardiovascular Disease

## 2013-07-11 ENCOUNTER — Ambulatory Visit (INDEPENDENT_AMBULATORY_CARE_PROVIDER_SITE_OTHER): Payer: Medicare Other | Admitting: Cardiovascular Disease

## 2013-07-11 VITALS — BP 132/76 | HR 66 | Ht 74.0 in | Wt 201.0 lb

## 2013-07-11 DIAGNOSIS — I10 Essential (primary) hypertension: Secondary | ICD-10-CM

## 2013-07-11 DIAGNOSIS — I509 Heart failure, unspecified: Secondary | ICD-10-CM

## 2013-07-11 DIAGNOSIS — I739 Peripheral vascular disease, unspecified: Secondary | ICD-10-CM

## 2013-07-11 DIAGNOSIS — I701 Atherosclerosis of renal artery: Secondary | ICD-10-CM

## 2013-07-11 DIAGNOSIS — I5042 Chronic combined systolic (congestive) and diastolic (congestive) heart failure: Secondary | ICD-10-CM

## 2013-07-11 DIAGNOSIS — E785 Hyperlipidemia, unspecified: Secondary | ICD-10-CM

## 2013-07-11 DIAGNOSIS — I779 Disorder of arteries and arterioles, unspecified: Secondary | ICD-10-CM

## 2013-07-11 NOTE — Assessment & Plan Note (Signed)
Ejection fraction is 25-30%. I'm going to refer him to Dr. Hillis Range for consideration of ICD therapy and/or potential CRT. Currently is class II.

## 2013-07-11 NOTE — Progress Notes (Signed)
07/11/2013 Scott Parsons   1929/10/07  [id number]  Primary Physician Scott Parsons,BYRON, Scott Parsons Primary Cardiologist: Scott GessJonathan J. Jerrilyn Messinger Scott Parsons Scott Parsons,Scott Parsons,Scott Parsons, Scott Parsons   HPI:  The patient is a very pleasant 80 year old, mildly overweight, married Caucasian male, father of 3, grandfather to 7 grandchildren who I saw in the office 1 year ago. He has a history of noncritical CAD by cath which I performed in 2003 with mild LV dysfunction. At that time, he had apical and inferoapical wall motion abnormalities and EF of 45%. He has PVOD with bilateral carotid disease left greater than right which we follow by duplex ultrasound. He is neurologically asymptomatic. I stented his left renal artery October 18, 2005, as well as both iliac arteries. He does have moderate SFA disease bilaterally. He does complain of some hip pain when walking on a treadmill or walking up an incline, but rides a bike without limitation. His other problems include hypertension and hyperlipidemia. His last Myoview performed December 08, 2009, revealed inferior scar. Scott Parsons follows his lipid profile. His most recent Dopplers reveal stable internal carotid artery stenosis left greater than right, as well as patent iliac stents with probably mild to moderate in-stent restenosis with ABIs of 0.89 on the right and 0.79 on the left.  Saw him last a/2/13 he has had admissions for congestive heart failure at [facility] in March of this year probably related to dietary indiscretion. He spent today's in the hospital I was direst. He had recent renal Doppler studies that showed progression of disease on left side suggesting "in-stent restenosis. He denies chest pain or shortness of breath now. Does have lifestyle limiting claudication.  I formed a 2-D echocardiogram on him 09/16/12 revealing an EF of 25-30% with a dilated left ventricle.in addition, renal Doppler suggest progression of left renal artery stenosis suggesting "in-stent restenosis who left  renal aortic ratio of 4.72.which has increased recently to 5.11. He denies chest pain or shortness of breath. I am going to refer him to Scott Parsons for consideration of ICD therapy for primary prevention plus or minus CRT therapy.    Current Outpatient Prescriptions  Medication Sig Dispense Refill  . Aclidinium Bromide (TUDORZA PRESSAIR) 400 MCG/ACT AEPB Inhale 1 puff into the lungs daily. Sometimes takes BID      . allopurinol (ZYLOPRIM) 100 MG tablet Take 100 mg by mouth daily.      Scott Kitchen. aspirin 81 MG tablet Take 81 mg by mouth daily.      Scott Kitchen. atorvastatin (LIPITOR) 20 MG tablet Take 20 mg by mouth daily.      . candesartan (ATACAND) 16 MG tablet Take 1 tablet by mouth daily.      . clopidogrel (PLAVIX) 75 MG tablet Take 75 mg by mouth daily.      . furosemide (LASIX) 40 MG tablet Take 40 mg by mouth. Take 1 1/2 tablets daily      . isosorbide mononitrate (IMDUR) 30 MG 24 hr tablet Take 30 mg by mouth daily.      . Magnesium 500 MG CAPS Take 1 capsule by mouth daily.      . metoprolol (TOPROL-XL) 200 MG 24 hr tablet Take 1 tablet by mouth daily.      . niacin 500 MG tablet Take 500 mg by mouth daily with breakfast.      . PROAIR HFA 108 (90 BASE) MCG/ACT inhaler       . spironolactone (ALDACTONE) 25 MG tablet Take 12.5 mg by mouth daily.      .Scott Kitchen  zolpidem (AMBIEN) 10 MG tablet Take 5 mg by mouth daily.       No current facility-administered medications for this visit.    Allergies  Allergen Reactions  . Amoxicillin   . Biaxin [Clarithromycin]   . Erythromycin   . Penicillins   . Shellfish Allergy     History   Social History  . Marital Status: Married    Spouse Name: N/A    Number of Children: N/A  . Years of Education: N/A   Occupational History  . Not on file.   Social History Main Topics  . Smoking status: Former Smoker -- 52 years    Types: Cigarettes    Quit date: 08/11/2004  . Smokeless tobacco: Never Used  . Alcohol Use: No  . Drug Use: No  . Sexual Activity:  Not on file   Other Topics Concern  . Not on file   Social History Narrative  . No narrative on file     Review of Systems: General: negative for chills, fever, night sweats or weight changes.  Cardiovascular: negative for chest pain, dyspnea on exertion, edema, orthopnea, palpitations, paroxysmal nocturnal dyspnea or shortness of breath Dermatological: negative for rash Respiratory: negative for cough or wheezing Urologic: negative for hematuria Abdominal: negative for nausea, vomiting, diarrhea, bright red blood per rectum, melena, or hematemesis Neurologic: negative for visual changes, syncope, or dizziness All other systems reviewed and are otherwise negative except as noted above.    Blood pressure 132/76, pulse 66, height 6\' 2"  (1.88 m), weight 91.173 kg (201 lb).  General appearance: alert and no distress Neck: no adenopathy, no JVD, supple, symmetrical, trachea midline, thyroid not enlarged, symmetric, no tenderness/mass/nodules and bilateral carotid bruits Lungs: clear to auscultation bilaterally Heart: regular rate and rhythm, S1, S2 normal, no murmur, click, rub or gallop Extremities: extremities normal, atraumatic, no cyanosis or edema  EKG normal sinus rhythm at 66 with left bundle-branch block  ASSESSMENT AND PLAN:   Chronic combined systolic and diastolic congestive heart failure, NYHA class 2 Ejection fraction is 25-30%. I'm going to refer him to Scott Parsons for consideration of ICD therapy and/or potential CRT. Currently is class II.  Essential hypertension Well-controlled on current medications  Hyperlipidemia On statin therapy followed by his PCP  Renal artery stenosis Status post left renal artery stenting by myself 6/11/7. We've been following this by duplex ultrasound was recently performed yesterday revealing a right renal aortic ratio of 3 and a left of 5.1. These results are stable.  Peripheral arterial disease History of bilateral iliac  stenting most recently 10/18/05. He does have moderate segmental mid SFA disease bilaterally with three-vessel runoff. He has mild claudication. His most recent lower extremity arterial Doppler studies performed 01/25/13 revealed ABIs of 1 on the right, 0.91 the left. Does appear to have mild "in-stent restenosis in both iliac stents.  Carotid artery disease Patient has mild to moderate left internal carotid artery stenosis by duplex ultrasound most recently performed on 510/14. Neurologically asymptomatic. This be repeated on an annual basis.      Scott Gess Scott Parsons FACP,Scott Parsons,Scott Parsons, [facility] 07/11/2013 3:31 PM

## 2013-07-11 NOTE — Patient Instructions (Signed)
Dr Allyson Sabal has referred you to Dr Hillis Range to discuss having an ICD placed.  Your physician recommends that you schedule a follow-up appointment in 6 months with an extender. Dr Allyson Sabal wants you to follow-up in 1 year. You will receive a reminder letter in the mail two months in advance. If you don't receive a letter, please call our office to schedule the follow-up appointment.

## 2013-07-11 NOTE — Assessment & Plan Note (Signed)
On statin therapy followed by his PCP 

## 2013-07-11 NOTE — Assessment & Plan Note (Signed)
Status post left renal artery stenting by myself 6/11/7. We've been following this by duplex ultrasound was recently performed yesterday revealing a right renal aortic ratio of 3 and a left of 5.1. These results are stable.

## 2013-07-11 NOTE — Assessment & Plan Note (Signed)
Well-controlled on current medications 

## 2013-07-11 NOTE — Assessment & Plan Note (Signed)
History of bilateral iliac stenting most recently 10/18/05. He does have moderate segmental mid SFA disease bilaterally with three-vessel runoff. He has mild claudication. His most recent lower extremity arterial Doppler studies performed 01/25/13 revealed ABIs of 1 on the right, 0.91 the left. Does appear to have mild "in-stent restenosis in both iliac stents.

## 2013-07-11 NOTE — Assessment & Plan Note (Addendum)
Patient has mild to moderate left internal carotid artery stenosis by duplex ultrasound most recently performed on 510/14. Neurologically asymptomatic. This be repeated on an annual basis.

## 2013-07-15 ENCOUNTER — Encounter: Payer: Self-pay | Admitting: *Deleted

## 2013-07-15 ENCOUNTER — Telehealth: Payer: Self-pay | Admitting: *Deleted

## 2013-07-15 DIAGNOSIS — I701 Atherosclerosis of renal artery: Secondary | ICD-10-CM

## 2013-07-15 NOTE — Telephone Encounter (Signed)
Message copied by Marella Bile on Sun Jul 15, 2013 10:20 PM ------      Message from: [name]      Created: Sun Jul 15, 2013  5:47 PM       No change from prior study. Repeat in 12 months. ------

## 2013-07-15 NOTE — Telephone Encounter (Signed)
Order placed for repeat renal dopplers in 1 year  

## 2013-08-22 ENCOUNTER — Encounter: Payer: Self-pay | Admitting: Internal Medicine

## 2013-08-22 ENCOUNTER — Ambulatory Visit (INDEPENDENT_AMBULATORY_CARE_PROVIDER_SITE_OTHER): Payer: Medicare Other | Admitting: Internal Medicine

## 2013-08-22 ENCOUNTER — Encounter: Payer: Self-pay | Admitting: *Deleted

## 2013-08-22 VITALS — BP 132/52 | HR 53 | Ht 74.0 in | Wt 202.0 lb

## 2013-08-22 DIAGNOSIS — I5042 Chronic combined systolic (congestive) and diastolic (congestive) heart failure: Secondary | ICD-10-CM

## 2013-08-22 DIAGNOSIS — I428 Other cardiomyopathies: Secondary | ICD-10-CM

## 2013-08-22 DIAGNOSIS — Z01812 Encounter for preprocedural laboratory examination: Secondary | ICD-10-CM

## 2013-08-22 DIAGNOSIS — I509 Heart failure, unspecified: Secondary | ICD-10-CM

## 2013-08-22 DIAGNOSIS — I5022 Chronic systolic (congestive) heart failure: Secondary | ICD-10-CM

## 2013-08-22 DIAGNOSIS — I739 Peripheral vascular disease, unspecified: Secondary | ICD-10-CM

## 2013-08-22 DIAGNOSIS — I447 Left bundle-branch block, unspecified: Secondary | ICD-10-CM

## 2013-08-22 NOTE — Patient Instructions (Addendum)
Your physician recommends that you continue on your current medications as directed. Please refer to the Current Medication list given to you today.  Your physician has requested that you have an echocardiogram prior to procedure. Echocardiography is a painless test that uses sound waves to create images of your heart. It provides your doctor with information about the size and shape of your heart and how well your heart's chambers and valves are working. This procedure takes approximately one hour. There are no restrictions for this procedure.  Your physician has recommended that you have a defibrillator inserted. An implantable cardioverter defibrillator (ICD) is a small device that is placed in your chest or, in rare cases, your abdomen. This device uses electrical pulses or shocks to help control life-threatening, irregular heartbeats that could lead the heart to suddenly stop beating (sudden cardiac arrest). Leads are attached to the ICD that goes into your heart. This is done in the hospital and usually requires an overnight stay. Please see the instruction sheet given to you today for more information.  Your physician recommends that you return for lab work on  for: BMET, CBC   STOP PLAVIX 7 DAYS PRIOR TO ICD IMPLANT  Wound check 5/18 at noon

## 2013-08-22 NOTE — Progress Notes (Signed)
Primary Care Physician: [name], MD Referring Physician:  Dr Bettina Gavia is a 80 y.o. male with a h/o longstanding history of a nonischemic CM (EF 25%).  He reports first being told that he had a "weak heart" in the 1990s.  He has been followed by Dr Allyson Sabal since 2003.  He has had several peripheral stents placed but has never had coronary intervention.  He is felt to likely have a hypertensive cardiomyopathy. He reports symptoms of SOB with moderate activity and fatigue.   He was admitted with CHF 2/15.  Today, he denies symptoms of palpitations, chest pain, orthopnea, PND, lower extremity edema, dizziness, presyncope, syncope, or neurologic sequela. The patient is tolerating medications without difficulties and is otherwise without complaint today.   Past Medical History  Diagnosis Date  . CAD (coronary artery disease)   . Hypertension   . Hyperlipidemia   . History of stress test 12/08/2009    revealed inferior scar.  [name] Hx of Doppler ultrasound     reveal stable internal carotid artery stenosis left greater than right, as well as patent iliac stents with probably mild to moderate in-stent restenosis with ABIs of 0.89 on the right and 0.79 on the left.  [name] Hx of echocardiogram 01/18/2005    EF 35-45% with mild MR and TR and his last functional study performed 8/05 revealed inferior apical and lateral scar with LV dilation.  . OSA (obstructive sleep apnea)     On C-pap  . PVD (peripheral vascular disease)     with right external iliac artery angioplasty and left renal artery angioplasty and stenting.  . Renal artery stenosis   . History of renal stent   . History of stroke   . Carotid artery disease   . Nonischemic cardiomyopathy   . Chronic systolic dysfunction of left ventricle     EF 25% by echo 9/14  . LBBB (left bundle branch block)    Past Surgical History  Procedure Laterality Date  . Cardiac catheterization  2003    Mild LV dysfuntion. he had apical and  inferoapical wall motion abnormalities and EF of 45%.  . Cardiac catheterization  10/18/2005    STENTS: left renal artery as well as both iliac arteries. Was stented with 18mm x 63mm Smart Nitinol self-expanding stent replaced with a 68mm x 78mm balloon at nominal pressures, resulting in a reduction od 50% proximal right external iliac artery  stenosis to 0% residual.  . Hand surgery  01/1995    Current Outpatient Prescriptions  Medication Sig Dispense Refill  . Aclidinium Bromide (TUDORZA PRESSAIR) 400 MCG/ACT AEPB Inhale 1 puff into the lungs daily. Sometimes takes BID      . albuterol (PROVENTIL HFA;VENTOLIN HFA) 108 (90 BASE) MCG/ACT inhaler Inhale 2 puffs into the lungs 2 (two) times daily as needed for wheezing or shortness of breath.      . allopurinol (ZYLOPRIM) 100 MG tablet Take 100 mg by mouth daily.      [name] aspirin 81 MG tablet Take 81 mg by mouth daily.      [name] atorvastatin (LIPITOR) 20 MG tablet Take 20 mg by mouth daily.      . candesartan (ATACAND) 16 MG tablet Take 1 tablet by mouth daily.      . clopidogrel (PLAVIX) 75 MG tablet Take 75 mg by mouth daily.      . furosemide (LASIX) 40 MG tablet Take 1 1/2 tablets daily      . isosorbide mononitrate (  IMDUR) 30 MG 24 hr tablet Take 30 mg by mouth daily.      [name] lisinopril (PRINIVIL,ZESTRIL) 40 MG tablet Take 1 tablet by mouth daily.      . Magnesium 500 MG CAPS Take 1 capsule by mouth daily.      . metoprolol (TOPROL-XL) 200 MG 24 hr tablet Take 1 tablet by mouth daily.      . niacin 500 MG tablet Take 500 mg by mouth daily with breakfast.      . NITROSTAT 0.4 MG SL tablet Place 0.4 mg under the tongue every 5 (five) minutes as needed.       . NON FORMULARY Wears O2 at night (2 L)      . PROAIR HFA 108 (90 BASE) MCG/ACT inhaler Inhale 2 puffs into the lungs 2 (two) times daily as needed.       [name] spironolactone (ALDACTONE) 25 MG tablet Take 12.5 mg by mouth daily.      [name] zolpidem (AMBIEN) 10 MG tablet Take 5 mg by mouth daily.        No current facility-administered medications for this visit.    Allergies  Allergen Reactions  . Amoxicillin   . Biaxin [Clarithromycin]   . Erythromycin   . Penicillins   . Shellfish Allergy     History   Social History  . Marital Status: Married    Spouse Name: N/A    Number of Children: N/A  . Years of Education: N/A   Occupational History  . Not on file.   Social History Main Topics  . Smoking status: Former Smoker -- 52 years    Types: Cigarettes    Quit date: 08/11/2004  . Smokeless tobacco: Never Used  . Alcohol Use: No  . Drug Use: No  . Sexual Activity: Not on file   Other Topics Concern  . Not on file   Social History Narrative   Retired    Family History  Problem Relation Age of Onset  . Alzheimer's disease Mother   . Heart disease Father   . Heart disease Paternal Grandfather     ROS- All systems are reviewed and negative except as per the HPI above  Physical Exam: Filed Vitals:   08/22/13 1001  BP: 132/52  Pulse: 53  Height: 6\' 2"  (1.88 m)  Weight: 202 lb (91.627 kg)    GEN- The patient is well appearing, alert and oriented x 3 today.   Head- normocephalic, atraumatic Eyes-  Sclera clear, conjunctiva pink Ears- hearing intact Oropharynx- clear Neck- supple, no JVP Lymph- no cervical lymphadenopathy Lungs- Clear to ausculation bilaterally, normal work of breathing Heart- Regular rate and rhythm, no murmurs, rubs or gallops, PMI not laterally displaced GI- soft, NT, ND, + BS Extremities- no clubbing, cyanosis, or edema MS- no significant deformity or atrophy Skin- no rash or lesion Psych- euthymic mood, full affect Neuro- strength and sensation are intact  EKG today reveals sinus rhythm 52 bpm, PR 202, LBBB with QRS 168 msec Echo 9/14 is reviewed Dr Renelda Mom notes are reviewed  Assessment and Plan:   1. Nonischemic CM/ chronic systolic dysfunction The patient has a nonischemic CM (EF 25%), NYHA Class III CHF, and LBBB (QRS  > 150 msec). He has been treated with an optimal medical regimen with persistence of his cardiomyopathy.  At this time, he meets MADIT II/ SCD-HeFT criteria for BiV ICD implantation for primary prevention of sudden death.  Risks, benefits, alternatives to BiV ICD implantation were discussed in  detail with the patient today. The patient  understands that the risks include but are not limited to bleeding, infection, pneumothorax, perforation, tamponade, vascular damage, renal failure, MI, stroke, death, inappropriate shocks, and lead dislodgement and wishes to proceed.  We will therefore schedule device implantation at the next available time.  As his echo is > 6 months old, we will repeat at this time, though I would anticipate that his EF will not recover as this has been a longstanding issue for him.  2. PVD I have spoken with Dr Allyson SabalBerry who feels that it is ok to hold plavix for 7 days for device implantation.

## 2013-08-31 NOTE — Progress Notes (Signed)
Thanks James.  JJB 

## 2013-09-03 ENCOUNTER — Encounter (HOSPITAL_COMMUNITY): Payer: Self-pay | Admitting: Pharmacy Technician

## 2013-09-03 ENCOUNTER — Other Ambulatory Visit (HOSPITAL_COMMUNITY): Payer: Self-pay | Admitting: Internal Medicine

## 2013-09-03 DIAGNOSIS — R0602 Shortness of breath: Secondary | ICD-10-CM

## 2013-09-03 DIAGNOSIS — I509 Heart failure, unspecified: Secondary | ICD-10-CM

## 2013-09-03 DIAGNOSIS — I428 Other cardiomyopathies: Secondary | ICD-10-CM

## 2013-09-06 ENCOUNTER — Ambulatory Visit (HOSPITAL_COMMUNITY): Payer: Medicare Other | Attending: Internal Medicine | Admitting: Radiology

## 2013-09-06 ENCOUNTER — Other Ambulatory Visit (INDEPENDENT_AMBULATORY_CARE_PROVIDER_SITE_OTHER): Payer: Medicare Other

## 2013-09-06 ENCOUNTER — Encounter: Payer: Self-pay | Admitting: Internal Medicine

## 2013-09-06 DIAGNOSIS — R0602 Shortness of breath: Secondary | ICD-10-CM

## 2013-09-06 DIAGNOSIS — Z01812 Encounter for preprocedural laboratory examination: Secondary | ICD-10-CM

## 2013-09-06 DIAGNOSIS — I509 Heart failure, unspecified: Secondary | ICD-10-CM

## 2013-09-06 DIAGNOSIS — I428 Other cardiomyopathies: Secondary | ICD-10-CM

## 2013-09-06 DIAGNOSIS — I5022 Chronic systolic (congestive) heart failure: Secondary | ICD-10-CM

## 2013-09-06 DIAGNOSIS — I5042 Chronic combined systolic (congestive) and diastolic (congestive) heart failure: Secondary | ICD-10-CM

## 2013-09-06 LAB — BASIC METABOLIC PANEL
BUN: 36 mg/dL — AB (ref 6–23)
CALCIUM: 9.3 mg/dL (ref 8.4–10.5)
CO2: 31 mEq/L (ref 19–32)
CREATININE: 2 mg/dL — AB (ref 0.4–1.5)
Chloride: 102 mEq/L (ref 96–112)
GFR: 34.22 mL/min — AB (ref >60.00)
GLUCOSE: 134 mg/dL — AB (ref 70–99)
Potassium: 4.4 mEq/L (ref 3.5–5.1)
Sodium: 138 mEq/L (ref 135–145)

## 2013-09-06 LAB — CBC WITH DIFFERENTIAL/PLATELET
Basophils Absolute: 0 10*3/uL (ref 0.0–0.1)
Basophils Relative: 0.4 % (ref 0.0–3.0)
EOS ABS: 0.2 10*3/uL (ref 0.0–0.7)
Eosinophils Relative: 3.1 % (ref 0.0–5.0)
HCT: 42.8 % (ref 39.0–52.0)
Hemoglobin: 14.4 g/dL (ref 13.0–17.0)
LYMPHS PCT: 24.2 % (ref 12.0–46.0)
Lymphs Abs: 1.4 10*3/uL (ref 0.7–4.0)
MCHC: 33.5 g/dL (ref 30.0–36.0)
MCV: 100.5 fl — ABNORMAL HIGH (ref 78.0–100.0)
Monocytes Absolute: 0.7 10*3/uL (ref 0.1–1.0)
Monocytes Relative: 11.5 % (ref 3.0–12.0)
NEUTROS ABS: 3.5 10*3/uL (ref 1.4–7.7)
NEUTROS PCT: 60.8 % (ref 43.0–77.0)
PLATELETS: 157 10*3/uL (ref 150.0–400.0)
RBC: 4.26 Mil/uL (ref 4.22–5.81)
RDW: 13.5 % (ref 11.5–14.6)
WBC: 5.7 10*3/uL (ref 4.5–10.5)

## 2013-09-06 NOTE — Progress Notes (Signed)
Echocardiogram performed.  

## 2013-09-11 ENCOUNTER — Telehealth: Payer: Self-pay | Admitting: Cardiovascular Disease

## 2013-09-11 DIAGNOSIS — Z79899 Other long term (current) drug therapy: Secondary | ICD-10-CM

## 2013-09-11 MED ORDER — LISINOPRIL 40 MG PO TABS: 20.0000 mg | ORAL_TABLET | Freq: Every day | ORAL | Status: DC

## 2013-09-11 NOTE — Telephone Encounter (Signed)
Lm to call

## 2013-09-11 NOTE — Telephone Encounter (Signed)
Returning your call ... Please Call

## 2013-09-11 NOTE — Telephone Encounter (Signed)
Lm with instructions from lab-decrease lisinopril to 20mg  daily and recheck blood work in 1 month.  Will mail labslip

## 2013-09-11 NOTE — Telephone Encounter (Signed)
Returning [place] call from yesterday.

## 2013-09-12 ENCOUNTER — Telehealth: Payer: Self-pay | Admitting: Internal Medicine

## 2013-09-12 MED ORDER — CHLORHEXIDINE GLUCONATE 4 % EX LIQD
60.0000 mL | Freq: Once | CUTANEOUS | Status: DC
  Filled 2013-09-12: qty 60

## 2013-09-12 MED ORDER — METHYLPREDNISOLONE SODIUM SUCC 125 MG IJ SOLR
125.0000 mg | INTRAMUSCULAR | Status: AC
  Administered 2013-09-13: 125 mg via INTRAVENOUS
  Filled 2013-09-12: qty 2

## 2013-09-12 MED ORDER — SODIUM CHLORIDE 0.9 % IR SOLN
80.0000 mg | Status: DC
  Filled 2013-09-12: qty 2

## 2013-09-12 MED ORDER — VANCOMYCIN HCL IN DEXTROSE 1-5 GM/200ML-% IV SOLN
1000.0000 mg | INTRAVENOUS | Status: DC
  Filled 2013-09-12 (×2): qty 200

## 2013-09-12 MED ORDER — DIPHENHYDRAMINE HCL 50 MG/ML IJ SOLN
25.0000 mg | INTRAMUSCULAR | Status: AC
  Administered 2013-09-13: 25 mg via INTRAVENOUS
  Filled 2013-09-12: qty 1

## 2013-09-12 MED ORDER — FAMOTIDINE IN NACL 20-0.9 MG/50ML-% IV SOLN
20.0000 mg | INTRAVENOUS | Status: AC
  Administered 2013-09-13: 20 mg via INTRAVENOUS
  Filled 2013-09-12: qty 50

## 2013-09-12 NOTE — Telephone Encounter (Signed)
Discussed with Dr Hassan Buckler patient and asked him not to take and come in as scheduled

## 2013-09-12 NOTE — Telephone Encounter (Signed)
New message    Patient calling over look plavix on his instructions sheet.    Patient stated he has been taken his plavix everyday since then.  Please advise

## 2013-09-12 NOTE — Progress Notes (Signed)
MESSAGE FROM DR. BERRY WAS TO KEEP MONITORING KIDNEY FUNCTION

## 2013-09-13 ENCOUNTER — Ambulatory Visit (HOSPITAL_COMMUNITY)
Admission: RE | Admit: 2013-09-13 | Discharge: 2013-09-14 | Disposition: A | Discharge status: Home / Self Care | Payer: Medicare Other | Source: Ambulatory Visit | Attending: Internal Medicine | Admitting: Internal Medicine

## 2013-09-13 ENCOUNTER — Encounter (HOSPITAL_COMMUNITY)
Admission: RE | Disposition: A | Discharge status: Home / Self Care | Payer: Self-pay | Source: Ambulatory Visit | Attending: Internal Medicine

## 2013-09-13 ENCOUNTER — Encounter (HOSPITAL_COMMUNITY): Payer: Self-pay | Admitting: General Practice

## 2013-09-13 DIAGNOSIS — Z7982 Long term (current) use of aspirin: Secondary | ICD-10-CM | POA: Insufficient documentation

## 2013-09-13 DIAGNOSIS — I428 Other cardiomyopathies: Secondary | ICD-10-CM

## 2013-09-13 DIAGNOSIS — I509 Heart failure, unspecified: Secondary | ICD-10-CM | POA: Insufficient documentation

## 2013-09-13 DIAGNOSIS — I5022 Chronic systolic (congestive) heart failure: Secondary | ICD-10-CM | POA: Insufficient documentation

## 2013-09-13 DIAGNOSIS — I10 Essential (primary) hypertension: Secondary | ICD-10-CM | POA: Insufficient documentation

## 2013-09-13 DIAGNOSIS — Z9889 Other specified postprocedural states: Secondary | ICD-10-CM | POA: Insufficient documentation

## 2013-09-13 DIAGNOSIS — I447 Left bundle-branch block, unspecified: Secondary | ICD-10-CM | POA: Diagnosis present

## 2013-09-13 DIAGNOSIS — Z7902 Long term (current) use of antithrombotics/antiplatelets: Secondary | ICD-10-CM | POA: Insufficient documentation

## 2013-09-13 DIAGNOSIS — E785 Hyperlipidemia, unspecified: Secondary | ICD-10-CM | POA: Insufficient documentation

## 2013-09-13 DIAGNOSIS — Z8673 Personal history of transient ischemic attack (TIA), and cerebral infarction without residual deficits: Secondary | ICD-10-CM | POA: Insufficient documentation

## 2013-09-13 DIAGNOSIS — I2510 Atherosclerotic heart disease of native coronary artery without angina pectoris: Secondary | ICD-10-CM | POA: Insufficient documentation

## 2013-09-13 DIAGNOSIS — I739 Peripheral vascular disease, unspecified: Secondary | ICD-10-CM | POA: Insufficient documentation

## 2013-09-13 DIAGNOSIS — G4733 Obstructive sleep apnea (adult) (pediatric): Secondary | ICD-10-CM | POA: Insufficient documentation

## 2013-09-13 DIAGNOSIS — Z87891 Personal history of nicotine dependence: Secondary | ICD-10-CM | POA: Insufficient documentation

## 2013-09-13 HISTORY — DX: Transient cerebral ischemic attack, unspecified: G45.9

## 2013-09-13 HISTORY — DX: Prediabetes: R73.03

## 2013-09-13 HISTORY — PX: BI-VENTRICULAR IMPLANTABLE CARDIOVERTER DEFIBRILLATOR: SHX5459

## 2013-09-13 HISTORY — PX: BI-VENTRICULAR IMPLANTABLE CARDIOVERTER DEFIBRILLATOR  (CRT-D): SHX5747

## 2013-09-13 LAB — GLUCOSE, CAPILLARY: Glucose-Capillary: 137 mg/dL — ABNORMAL HIGH (ref 70–99)

## 2013-09-13 LAB — SURGICAL PCR SCREEN
MRSA, PCR: NEGATIVE
Staphylococcus aureus: NEGATIVE

## 2013-09-13 SURGERY — BI-VENTRICULAR IMPLANTABLE CARDIOVERTER DEFIBRILLATOR  (CRT-D)
Anesthesia: LOCAL

## 2013-09-13 MED ORDER — FENTANYL CITRATE 0.05 MG/ML IJ SOLN
INTRAMUSCULAR | Status: AC
  Filled 2013-09-13: qty 2

## 2013-09-13 MED ORDER — LIDOCAINE HCL (PF) 1 % IJ SOLN
INTRAMUSCULAR | Status: AC
  Filled 2013-09-13: qty 30

## 2013-09-13 MED ORDER — ZOLPIDEM TARTRATE 5 MG PO TABS
5.0000 mg | ORAL_TABLET | Freq: Every day | ORAL | Status: DC
  Administered 2013-09-13: 5 mg via ORAL
  Filled 2013-09-13: qty 1

## 2013-09-13 MED ORDER — HEPARIN (PORCINE) IN NACL 2-0.9 UNIT/ML-% IJ SOLN
INTRAMUSCULAR | Status: AC
  Filled 2013-09-13: qty 500

## 2013-09-13 MED ORDER — MIDAZOLAM HCL 5 MG/5ML IJ SOLN
INTRAMUSCULAR | Status: AC
  Filled 2013-09-13: qty 5

## 2013-09-13 MED ORDER — HYDROCODONE-ACETAMINOPHEN 5-325 MG PO TABS: 1.0000 | ORAL_TABLET | ORAL | Status: DC | PRN

## 2013-09-13 MED ORDER — VANCOMYCIN HCL IN DEXTROSE 1-5 GM/200ML-% IV SOLN: 1000.0000 mg | Freq: Two times a day (BID) | INTRAVENOUS | Status: DC

## 2013-09-13 MED ORDER — ASPIRIN EC 81 MG PO TBEC
81.0000 mg | DELAYED_RELEASE_TABLET | Freq: Every day | ORAL | Status: DC
  Administered 2013-09-13 – 2013-09-14 (×2): 81 mg via ORAL
  Filled 2013-09-13 (×2): qty 1

## 2013-09-13 MED ORDER — ALBUTEROL SULFATE HFA 108 (90 BASE) MCG/ACT IN AERS: 2.0000 | INHALATION_SPRAY | Freq: Two times a day (BID) | RESPIRATORY_TRACT | Status: DC | PRN

## 2013-09-13 MED ORDER — SODIUM CHLORIDE 0.9 % IV SOLN: 250.0000 mL | INTRAVENOUS | Status: DC | PRN

## 2013-09-13 MED ORDER — ACETAMINOPHEN 325 MG PO TABS: 325.0000 mg | ORAL_TABLET | ORAL | Status: DC | PRN

## 2013-09-13 MED ORDER — ONDANSETRON HCL 4 MG/2ML IJ SOLN: 4.0000 mg | Freq: Four times a day (QID) | INTRAMUSCULAR | Status: DC | PRN

## 2013-09-13 MED ORDER — SODIUM CHLORIDE 0.9 % IJ SOLN: 3.0000 mL | Freq: Two times a day (BID) | INTRAMUSCULAR | Status: DC

## 2013-09-13 MED ORDER — METOPROLOL SUCCINATE ER 100 MG PO TB24
100.0000 mg | ORAL_TABLET | Freq: Every day | ORAL | Status: DC
  Administered 2013-09-14: 100 mg via ORAL
  Filled 2013-09-13: qty 1

## 2013-09-13 MED ORDER — SODIUM CHLORIDE 0.9 % IV SOLN
INTRAVENOUS | Status: DC
Start: 2013-09-13 — End: 2013-09-13
  Administered 2013-09-13: 10:00:00 via INTRAVENOUS

## 2013-09-13 MED ORDER — ALBUTEROL SULFATE (2.5 MG/3ML) 0.083% IN NEBU: 2.5000 mg | INHALATION_SOLUTION | Freq: Two times a day (BID) | RESPIRATORY_TRACT | Status: DC | PRN

## 2013-09-13 MED ORDER — SODIUM CHLORIDE 0.9 % IJ SOLN: 3.0000 mL | INTRAMUSCULAR | Status: DC | PRN

## 2013-09-13 MED ORDER — MUPIROCIN 2 % EX OINT
TOPICAL_OINTMENT | Freq: Two times a day (BID) | CUTANEOUS | Status: DC
  Administered 2013-09-13: 10:00:00 via NASAL
  Filled 2013-09-13 (×2): qty 22

## 2013-09-13 MED ORDER — VANCOMYCIN HCL IN DEXTROSE 1-5 GM/200ML-% IV SOLN
1000.0000 mg | Freq: Two times a day (BID) | INTRAVENOUS | Status: AC
  Administered 2013-09-13: 1000 mg via INTRAVENOUS
  Filled 2013-09-13: qty 200

## 2013-09-13 MED ORDER — ALLOPURINOL 100 MG PO TABS
100.0000 mg | ORAL_TABLET | Freq: Every day | ORAL | Status: DC
  Administered 2013-09-13 – 2013-09-14 (×2): 100 mg via ORAL
  Filled 2013-09-13 (×2): qty 1

## 2013-09-13 NOTE — H&P (View-Only) (Signed)
Primary Care Physician: [name], MD Referring Physician:  Dr Bettina Gavia is a 80 y.o. male with a h/o longstanding history of a nonischemic CM (EF 25%).  He reports first being told that he had a "weak heart" in the 1990s.  He has been followed by Dr Allyson Sabal since 2003.  He has had several peripheral stents placed but has never had coronary intervention.  He is felt to likely have a hypertensive cardiomyopathy. He reports symptoms of SOB with moderate activity and fatigue.   He was admitted with CHF 2/15.  Today, he denies symptoms of palpitations, chest pain, orthopnea, PND, lower extremity edema, dizziness, presyncope, syncope, or neurologic sequela. The patient is tolerating medications without difficulties and is otherwise without complaint today.   Past Medical History  Diagnosis Date  . CAD (coronary artery disease)   . Hypertension   . Hyperlipidemia   . History of stress test 12/08/2009    revealed inferior scar.  [name] Hx of Doppler ultrasound     reveal stable internal carotid artery stenosis left greater than right, as well as patent iliac stents with probably mild to moderate in-stent restenosis with ABIs of 0.89 on the right and 0.79 on the left.  [name] Hx of echocardiogram 01/18/2005    EF 35-45% with mild MR and TR and his last functional study performed 8/05 revealed inferior apical and lateral scar with LV dilation.  . OSA (obstructive sleep apnea)     On C-pap  . PVD (peripheral vascular disease)     with right external iliac artery angioplasty and left renal artery angioplasty and stenting.  . Renal artery stenosis   . History of renal stent   . History of stroke   . Carotid artery disease   . Nonischemic cardiomyopathy   . Chronic systolic dysfunction of left ventricle     EF 25% by echo 9/14  . LBBB (left bundle branch block)    Past Surgical History  Procedure Laterality Date  . Cardiac catheterization  2003    Mild LV dysfuntion. he had apical and  inferoapical wall motion abnormalities and EF of 45%.  . Cardiac catheterization  10/18/2005    STENTS: left renal artery as well as both iliac arteries. Was stented with 18mm x 63mm Smart Nitinol self-expanding stent replaced with a 68mm x 78mm balloon at nominal pressures, resulting in a reduction od 50% proximal right external iliac artery  stenosis to 0% residual.  . Hand surgery  01/1995    Current Outpatient Prescriptions  Medication Sig Dispense Refill  . Aclidinium Bromide (TUDORZA PRESSAIR) 400 MCG/ACT AEPB Inhale 1 puff into the lungs daily. Sometimes takes BID      . albuterol (PROVENTIL HFA;VENTOLIN HFA) 108 (90 BASE) MCG/ACT inhaler Inhale 2 puffs into the lungs 2 (two) times daily as needed for wheezing or shortness of breath.      . allopurinol (ZYLOPRIM) 100 MG tablet Take 100 mg by mouth daily.      [name] aspirin 81 MG tablet Take 81 mg by mouth daily.      [name] atorvastatin (LIPITOR) 20 MG tablet Take 20 mg by mouth daily.      . candesartan (ATACAND) 16 MG tablet Take 1 tablet by mouth daily.      . clopidogrel (PLAVIX) 75 MG tablet Take 75 mg by mouth daily.      . furosemide (LASIX) 40 MG tablet Take 1 1/2 tablets daily      . isosorbide mononitrate (  IMDUR) 30 MG 24 hr tablet Take 30 mg by mouth daily.      [name] lisinopril (PRINIVIL,ZESTRIL) 40 MG tablet Take 1 tablet by mouth daily.      . Magnesium 500 MG CAPS Take 1 capsule by mouth daily.      . metoprolol (TOPROL-XL) 200 MG 24 hr tablet Take 1 tablet by mouth daily.      . niacin 500 MG tablet Take 500 mg by mouth daily with breakfast.      . NITROSTAT 0.4 MG SL tablet Place 0.4 mg under the tongue every 5 (five) minutes as needed.       . NON FORMULARY Wears O2 at night (2 L)      . PROAIR HFA 108 (90 BASE) MCG/ACT inhaler Inhale 2 puffs into the lungs 2 (two) times daily as needed.       [name] spironolactone (ALDACTONE) 25 MG tablet Take 12.5 mg by mouth daily.      [name] zolpidem (AMBIEN) 10 MG tablet Take 5 mg by mouth daily.        No current facility-administered medications for this visit.    Allergies  Allergen Reactions  . Amoxicillin   . Biaxin [Clarithromycin]   . Erythromycin   . Penicillins   . Shellfish Allergy     History   Social History  . Marital Status: Married    Spouse Name: N/A    Number of Children: N/A  . Years of Education: N/A   Occupational History  . Not on file.   Social History Main Topics  . Smoking status: Former Smoker -- 52 years    Types: Cigarettes    Quit date: 08/11/2004  . Smokeless tobacco: Never Used  . Alcohol Use: No  . Drug Use: No  . Sexual Activity: Not on file   Other Topics Concern  . Not on file   Social History Narrative   Retired    Family History  Problem Relation Age of Onset  . Alzheimer's disease Mother   . Heart disease Father   . Heart disease Paternal Grandfather     ROS- All systems are reviewed and negative except as per the HPI above  Physical Exam: Filed Vitals:   08/22/13 1001  BP: 132/52  Pulse: 53  Height: 6\' 2"  (1.88 m)  Weight: 202 lb (91.627 kg)    GEN- The patient is well appearing, alert and oriented x 3 today.   Head- normocephalic, atraumatic Eyes-  Sclera clear, conjunctiva pink Ears- hearing intact Oropharynx- clear Neck- supple, no JVP Lymph- no cervical lymphadenopathy Lungs- Clear to ausculation bilaterally, normal work of breathing Heart- Regular rate and rhythm, no murmurs, rubs or gallops, PMI not laterally displaced GI- soft, NT, ND, + BS Extremities- no clubbing, cyanosis, or edema MS- no significant deformity or atrophy Skin- no rash or lesion Psych- euthymic mood, full affect Neuro- strength and sensation are intact  EKG today reveals sinus rhythm 52 bpm, PR 202, LBBB with QRS 168 msec Echo 9/14 is reviewed Dr Renelda Mom notes are reviewed  Assessment and Plan:   1. Nonischemic CM/ chronic systolic dysfunction The patient has a nonischemic CM (EF 25%), NYHA Class III CHF, and LBBB (QRS  > 150 msec). He has been treated with an optimal medical regimen with persistence of his cardiomyopathy.  At this time, he meets MADIT II/ SCD-HeFT criteria for BiV ICD implantation for primary prevention of sudden death.  Risks, benefits, alternatives to BiV ICD implantation were discussed in  detail with the patient today. The patient  understands that the risks include but are not limited to bleeding, infection, pneumothorax, perforation, tamponade, vascular damage, renal failure, MI, stroke, death, inappropriate shocks, and lead dislodgement and wishes to proceed.  We will therefore schedule device implantation at the next available time.  As his echo is > 6 months old, we will repeat at this time, though I would anticipate that his EF will not recover as this has been a longstanding issue for him.  2. PVD I have spoken with Dr Allyson SabalBerry who feels that it is ok to hold plavix for 7 days for device implantation.

## 2013-09-13 NOTE — Interval H&P Note (Signed)
History and Physical Interval Note:  09/13/2013 10:02 AM  ICD Criteria  Current LVEF:15% ;Obtained > 6 months ago.   NYHA Functional Classification: Class III  Heart Failure History:  Yes, Duration of heart failure since onset is > 9 months  Non-Ischemic Dilated Cardiomyopathy History:  Yes, timeframe is > 9 months  Atrial Fibrillation/Atrial Flutter:  No.  Ventricular Tachycardia History:  No.  Cardiac Arrest History:  No  History of Syndromes with Risk of Sudden Death:  No.  Previous ICD:  No.  Electrophysiology Study: No.  Prior MI: No.  PPM: No.  OSA:  No  Patient Life Expectancy of >=1 year: Yes.  Anticoagulation Therapy:  Patient is NOT on anticoagulation therapy.   Beta Blocker Therapy:  Yes.   Ace Inhibitor/ARB Therapy:  Yes.   Scott Parsons  has presented today for surgery, with the diagnosis of chf  The various methods of treatment have been discussed with the patient and family. After consideration of risks, benefits and other options for treatment, the patient has consented to  Procedure(s): BI-VENTRICULAR IMPLANTABLE CARDIOVERTER DEFIBRILLATOR  (CRT-D) (N/A) as a surgical intervention .  The patient's history has been reviewed, patient examined, no change in status, stable for surgery.  I have reviewed the patient's chart and labs.  Questions were answered to the patient's satisfaction.     Hillis Range

## 2013-09-13 NOTE — Discharge Summary (Signed)
ELECTROPHYSIOLOGY PROCEDURE DISCHARGE SUMMARY    Patient ID: Scott Parsons,  MRN: [id number], DOB/AGE: [birth year] 80 y.o.  Admit date: 09/13/2013 Discharge date: 09/14/2013  Primary Care Physician: [name], MD Primary Cardiologist: Allyson Sabal Electrophysiologist: [place]  Primary Discharge Diagnosis:  Non ischemic cardiomyopathy, CHF, and LBBB s/p CRTD implantation this admission  Secondary Discharge Diagnosis:  1.  Renal artery stenosis s/p stenting 2.  HTN 3.  Hyperlipidemia 4.  OSA - on CPAP 5.  PVD  Allergies  Allergen Reactions  . Amoxicillin Itching    Itching    . Biaxin [Clarithromycin] Itching    Lip swelling     . Erythromycin Itching  . Penicillins Itching  . Shellfish Allergy Itching           Procedures This Admission:  1.  Implantation of a MDT CRTD on 09-13-13 by Dr Johney Frame.  The patient received a Medtronic model number Viva Quad XT CRTD with model number 5076 right atrial lead, 6935 right ventricular lead, and 4598 left ventricular lead.  DFT's were deferred at time of implant.  There were no immediate post procedure complications. 2.  CXR on 09-14-2013 demonstrated no pneumothorax status post device implantation.   Brief HPI: Scott Parsons is a 80 y.o. male was referred to electrophysiology in the outpatient setting for consideration of CRTD implantation.  Past medical history includes non ischemic cardiomyopathy, congestive heart failure, and LBBB.  The patient has persistent LV dysfunction despite guideline directed therapy.  Risks, benefits, and alternatives to ICD implantation were reviewed with the patient who wished to proceed.   Hospital Course:  The patient was admitted and underwent implantation of a MDT CRTD with details as outlined above.   He was monitored on telemetry overnight which demonstrated sinus rhythm with ventricular pacing.  Left chest was without hematoma or ecchymosis.  The device was interrogated and found to be functioning  normally.  CXR was obtained and demonstrated no pneumothorax status post device implantation.  Wound care, arm mobility, and restrictions were reviewed with the patient.  Dr Johney Frame examined the patient and considered them stable for discharge to home.   The patient's discharge medications include an ACE inhibitor (lisinopril) and beta blocker (Metoprolol). Due to renal failure, his candisartan is discontinued.  Lasix is decreased to 40mg  daily.  We will have Sherri Rad start following monthly optivol.  Decrease metoprolol to 100mg  daily due to low blood pressure in the hospital.  Discharge Vitals: Blood pressure 123/47, pulse 62, temperature 97.3 F (36.3 C), temperature source Oral, resp. rate 18, height 6\' 2"  (1.88 m), weight 194 lb (87.998 kg), SpO2 97.00%.    Physical Exam: Filed Vitals:   09/13/13 2008 09/14/13 0000 09/14/13 0520 09/14/13 0626  BP: 123/43 119/46  123/47  Pulse: 70 68  62  Temp: 97.5 F (36.4 C) 97.6 F (36.4 C)  97.3 F (36.3 C)  TempSrc: Oral Oral  Oral  Resp: 16 16  18   Height:      Weight:   194 lb (87.998 kg)   SpO2: 94% 94%  97%    GEN- The patient is well appearing, alert and oriented x 3 today.   Head- normocephalic, atraumatic Eyes-  Sclera clear, conjunctiva pink Ears- hearing intact Oropharynx- clear Neck- supple,flat JVP Device site without hematoma/ bleeding Lungs- Clear to ausculation bilaterally, normal work of breathing Heart- Regular rate and rhythm, no murmurs, rubs or gallops, PMI not laterally displaced GI- soft, NT, ND, + BS Extremities- no clubbing, cyanosis,  or edema Neuro- strength and sensation are intact   Labs:   Lab Results  Component Value Date   WBC 5.7 09/06/2013   HGB 14.4 09/06/2013   HCT 42.8 09/06/2013   MCV 100.5* 09/06/2013   PLT 157.0 09/06/2013     Recent Labs Lab 09/14/13 0537  NA 140  K 4.8  CL 104  CO2 23  BUN 39*  CREATININE 1.69*  CALCIUM 9.1  GLUCOSE 142*     Discharge Medications:      Medication List    STOP taking these medications       candesartan 16 MG tablet  Commonly known as:  ATACAND      TAKE these medications       albuterol 108 (90 BASE) MCG/ACT inhaler  Commonly known as:  PROVENTIL HFA;VENTOLIN HFA  Inhale 2 puffs into the lungs 2 (two) times daily as needed for shortness of breath.     allopurinol 100 MG tablet  Commonly known as:  ZYLOPRIM  Take 100 mg by mouth daily.     ammonium lactate 12 % lotion  Commonly known as:  LAC-HYDRIN  Apply 1 application topically daily. Pt applies to feet after each shower     aspirin EC 81 MG tablet  Take 81 mg by mouth daily.     atorvastatin 20 MG tablet  Commonly known as:  LIPITOR  Take 20 mg by mouth daily.     clopidogrel 75 MG tablet  Commonly known as:  PLAVIX  Take 75 mg by mouth daily.     furosemide 40 MG tablet  Commonly known as:  LASIX  Take 1 tablet (40 mg total) by mouth daily. Take 1 1/2 tablets daily     isosorbide mononitrate 30 MG 24 hr tablet  Commonly known as:  IMDUR  Take 30 mg by mouth daily.     lisinopril 40 MG tablet  Commonly known as:  PRINIVIL,ZESTRIL  Take 0.5 tablets (20 mg total) by mouth daily.     Magnesium 500 MG Caps  Take 500 mg by mouth daily.     metoprolol succinate 100 MG 24 hr tablet  Commonly known as:  TOPROL-XL  Take 1 tablet (100 mg total) by mouth daily.     niacin 500 MG tablet  Take 500 mg by mouth daily with breakfast.     NITROSTAT 0.4 MG SL tablet  Generic drug:  nitroGLYCERIN  Place 0.4 mg under the tongue every 5 (five) minutes as needed.     NON FORMULARY  Wears O2 at night (2 L)     spironolactone 25 MG tablet  Commonly known as:  ALDACTONE  Take 12.5 mg by mouth daily.     TUDORZA PRESSAIR 400 MCG/ACT Aepb  Generic drug:  Aclidinium Bromide  Inhale 1 puff into the lungs 2 (two) times daily. Sometimes takes BID     zolpidem 10 MG tablet  Commonly known as:  AMBIEN  Take 5 mg by mouth at bedtime.         Disposition:   Future Appointments Provider Department Dept Phone   09/24/2013 12:00 PM [facility] [place] Office [phone]   10/25/2013 4:00 PM [name], MD [facility] [phone]   12/19/2013 11:00 AM Hillis Range, MD [facility] [phone]     Follow-up Information   Follow up with [facility] On 09/24/2013. (For wound re-check at 12 noon)       Follow  up with Runell GessBERRY,JONATHAN J, MD On 10/25/2013. (4pm)    Specialty:  Cardiology   Contact information:   [address] [place] [address] [phone]       Follow up with Hillis RangeJames Yarisbel Miranda, MD On 12/19/2013. (11am)    Specialty:  Cardiology   Contact information:   [address] [place] [address] [phone]       Duration of Discharge Encounter: Greater than 30 minutes including physician time.  Signed,   Hillis RangeJames Erica Richwine MD

## 2013-09-13 NOTE — Op Note (Signed)
SURGEON: Hillis Range, MD   PREPROCEDURE DIAGNOSES:  1. Nonischemic cardiomyopathy.  2. New York Heart Association class III, heart failure chronically.  3. Left bundle-branch block.   POSTPROCEDURE DIAGNOSES:  1. Nonischemic cardiomyopathy.  2. New York Heart Association class III heart failure chronically.  3. Left bundle-branch block.   PROCEDURES:  1.   Biventricular ICD implantation.   INTRODUCTION:  Scott Parsons is a 80 y.o. male with a nonischemic CM (EF 15%), NYHA Class III CHF, and LBBB QRS morophology. At this time, he meets MADIT II/ SCD-HeFT criteria for ICD implantation for primary prevention of sudden death. Given LBBB, the patient may also be expected to benefit from resynchronization therapy.   The patient has been treated with an optimal medical regimen but continues to have a depressed ejection fraction and NYHA Class III CHF symptoms. he therefore presents today for a biventricular ICD implantation.   DESCRIPTION OF PROCEDURE: Informed written consent was obtained and the patient was brought to the electrophysiology lab in the fasting state. The patient was adequately sedated with intravenous Versed, and fentanyl as outlined in the nursing report. The patient's left chest was prepped and draped in the usual sterile fashion by the EP lab staff. The skin overlying the left deltopectoral region was infiltrated with lidocaine for local analgesia. A 5-cm incision was made over the left deltopectoral region. A left subcutaneous defibrillator pocket was fashioned using a combination of sharp and blunt dissection. Electrocautery was used to assure hemostasis.   RA/RV Lead Placement:  The left axillary vein was cannulated with fluoroscopic visualization. No contrast was required for this endeavor. Through the left axillary vein, a Medtronic model [id number] (serial # [id number] ) right atrial lead and a Medtronic model [id number] (serial number [id number]) right ventricular defibrillator  lead were advanced with fluoroscopic visualization into the right atrial appendage and right ventricular apex positions respectively. Initial atrial lead P-waves measured 4.2  mV with an impedance of 711 ohms and a threshold of 1.3 volts at 0.5 milliseconds. The right ventricular lead R-wave measured 12.8 mV with impedance of 568 ohms and a threshold of 0.9 volts at 0.5 milliseconds.   LV Lead Placement:  A Medtronic MB-2 guide was advanced through the left axillary vein into the low lateral right atrium. A Bard curved Damato catheter was introduced through the MB-2 guide and used to cannulate the coronary sinus. Coronary sinus cannulation was confirmed with electrogram recording from the hexapolar catheter. A selective coronary sinus venogram was performed by hand injection of nonionic contrast. This demonstrated a large CS body with very small distal branches. There was a moderate sized lateral coronary sinus branch was noted along the mid portion of the CS body. There was also a moderate sized posterior vein.  A Whisper CSJ wire was introduced through the guide and advanced into the lateral branch. A Medtronic model 4598 - 88 (serial number [id number]) lead was advanced through the MB-2 into the lateral branch. This was approximately one-thirds from the base to the apex in a very lateral position. In this location, the left ventricular lead R-waves measured 23 mV with impedance of 982 ohms and a threshold of 0.5 volt at 0.5 milliseconds in the bipolar LV1-LV2 configuration with no diaphragmatic stimulation observed when pacing at 10 volts output. The MB-2 guide was therefore removed.  All three leads were secured to the pectoralis fascia using #2 silk suture over the suture sleeves. The pocket then irrigated with copious gentamicin solution. The leads were then connected  to a Medtronic model [id number] Ovidio KinViva Quad XT CRT-D (serial Number [id number]) device. The defibrillator was placed into the pocket. The pocket  was then closed in 2 layers with 2.0 Vicryl suture for the subcutaneous and subcuticular layers. Steri-Strips and a sterile dressing were then applied.  DFT testing was not performed today. The procedure was therefore considered completed. There were no early apparhent complications.   CONCLUSIONS:  1. Nonischemic cardiomyopathy with Left bundle-branch block and chronic New York Heart Association class III heart failure.  2. Successful biventricular ICD implantation.  3. No early apparent complications.

## 2013-09-14 ENCOUNTER — Other Ambulatory Visit: Payer: Self-pay | Admitting: *Deleted

## 2013-09-14 ENCOUNTER — Encounter (HOSPITAL_COMMUNITY): Payer: Self-pay | Admitting: *Deleted

## 2013-09-14 ENCOUNTER — Ambulatory Visit (HOSPITAL_COMMUNITY): Payer: Medicare Other

## 2013-09-14 DIAGNOSIS — I509 Heart failure, unspecified: Secondary | ICD-10-CM

## 2013-09-14 DIAGNOSIS — I428 Other cardiomyopathies: Secondary | ICD-10-CM

## 2013-09-14 LAB — BASIC METABOLIC PANEL
BUN: 39 mg/dL — AB (ref 6–23)
CHLORIDE: 104 meq/L (ref 96–112)
CO2: 23 mEq/L (ref 19–32)
Calcium: 9.1 mg/dL (ref 8.4–10.5)
Creatinine, Ser: 1.69 mg/dL — ABNORMAL HIGH (ref 0.50–1.35)
GFR, EST AFRICAN AMERICAN: 42 mL/min — AB (ref >90)
GFR, EST NON AFRICAN AMERICAN: 36 mL/min — AB (ref >90)
GLUCOSE: 142 mg/dL — AB (ref 70–99)
POTASSIUM: 4.8 meq/L (ref 3.7–5.3)
SODIUM: 140 meq/L (ref 137–147)

## 2013-09-14 MED ORDER — METOPROLOL SUCCINATE ER 100 MG PO TB24: 100.0000 mg | ORAL_TABLET | Freq: Every day | ORAL | Status: DC

## 2013-09-14 MED ORDER — FUROSEMIDE 40 MG PO TABS: 40.0000 mg | ORAL_TABLET | Freq: Every day | ORAL | Status: DC

## 2013-09-14 NOTE — Discharge Instructions (Signed)
° ° °  Supplemental Discharge Instructions for  Pacemaker/Defibrillator Patients  Activity No heavy lifting or vigorous activity with your left/right arm for 6 to 8 weeks.  Do not raise your left/right arm above your head for one week.  Gradually raise your affected arm as drawn below.                5/10                    5/11                           5/12                          5/13 __  NO DRIVING for 1 week.  WOUND CARE   Keep the wound area clean and dry.  Do not get this area wet for one week. No showers for one week; you may shower on     .   The tape/steri-strips on your wound will fall off; do not pull them off.  No bandage is needed on the site.  DO  NOT apply any creams, oils, or ointments to the wound area.   If you notice any drainage or discharge from the wound, any swelling or bruising at the site, or you develop a fever > 101? F after you are discharged home, call the office at once.  Special Instructions   You are still able to use cellular telephones; use the ear opposite the side where you have your pacemaker/defibrillator.  Avoid carrying your cellular phone near your device.   When traveling through airports, show security personnel your identification card to avoid being screened in the metal detectors.  Ask the security personnel to use the hand wand.   Avoid arc welding equipment, MRI testing (magnetic resonance imaging), TENS units (transcutaneous nerve stimulators).  Call the office for questions about other devices.   Avoid electrical appliances that are in poor condition or are not properly grounded.   Microwave ovens are safe to be near or to operate.  Additional information for defibrillator patients should your device go off:   If your device goes off ONCE and you feel fine afterward, notify the device clinic nurses.   If your device goes off ONCE and you do not feel well afterward, call 911.   If your device goes off TWICE, call 911.   If your device goes  off THREE times in one day, call 911.  DO NOT DRIVE YOURSELF OR A FAMILY MEMBER WITH A DEFIBRILLATOR TO THE HOSPITAL--CALL 911.

## 2013-09-14 NOTE — Progress Notes (Signed)
I spoke with Scott Parsons to get clarification on Lasix. Pt is to take 40mg  daily. Pt informed

## 2013-09-18 ENCOUNTER — Telehealth: Payer: Self-pay | Admitting: Internal Medicine

## 2013-09-18 NOTE — Telephone Encounter (Addendum)
The list he was given at discharge did not show Lisinopril.  It is still on his discharge summary to take.  He was told last week to take 20mg  by Dr Hazle Coca nurse, which he has been doing.  He will continue to take

## 2013-09-18 NOTE — Telephone Encounter (Signed)
New message     Talk to a nurse to go over medication list.  He is not sure he is taking his medications correctly

## 2013-09-20 ENCOUNTER — Telehealth: Payer: Self-pay | Admitting: Internal Medicine

## 2013-09-20 NOTE — Telephone Encounter (Signed)
New message     Want to let someone know that the defibulator site is bruised.  He was reading the discharge instructions and it said call if pt has bruising, swelling or drainage.  Please call

## 2013-09-21 NOTE — Telephone Encounter (Signed)
Patient states that he noticed that he had bruising at his ICD site on Wednesday. He states that he hasn't noticed any pains, drainage, or heat coming from the area. I explained to him that bruising was not uncommon for this procedure, but it will resolve. He is aware that if he notices any changes in his site over the weekend then he is to head to the ED otherwise he is to F/U on Monday as scheduled. Patient voiced understanding.

## 2013-09-24 ENCOUNTER — Other Ambulatory Visit (INDEPENDENT_AMBULATORY_CARE_PROVIDER_SITE_OTHER): Payer: Medicare Other

## 2013-09-24 ENCOUNTER — Ambulatory Visit (INDEPENDENT_AMBULATORY_CARE_PROVIDER_SITE_OTHER): Payer: Medicare Other | Admitting: *Deleted

## 2013-09-24 DIAGNOSIS — I509 Heart failure, unspecified: Secondary | ICD-10-CM

## 2013-09-24 DIAGNOSIS — I428 Other cardiomyopathies: Secondary | ICD-10-CM

## 2013-09-24 DIAGNOSIS — I5042 Chronic combined systolic (congestive) and diastolic (congestive) heart failure: Secondary | ICD-10-CM

## 2013-09-24 LAB — BASIC METABOLIC PANEL
BUN: 24 mg/dL — ABNORMAL HIGH (ref 6–23)
CHLORIDE: 104 meq/L (ref 96–112)
CO2: 27 mEq/L (ref 19–32)
Calcium: 9.5 mg/dL (ref 8.4–10.5)
Creatinine, Ser: 1.7 mg/dL — ABNORMAL HIGH (ref 0.4–1.5)
GFR: 42.13 mL/min — ABNORMAL LOW (ref >60.00)
Glucose, Bld: 120 mg/dL — ABNORMAL HIGH (ref 70–99)
POTASSIUM: 3.9 meq/L (ref 3.5–5.1)
SODIUM: 139 meq/L (ref 135–145)

## 2013-09-24 LAB — MDC_IDC_ENUM_SESS_TYPE_INCLINIC
Battery Remaining Longevity: 110 mo
Battery Voltage: 3.11 V
Brady Statistic AP VP Percent: 41.59 %
Brady Statistic AS VS Percent: 1.68 %
Brady Statistic RA Percent Paced: 42.43 %
HIGH POWER IMPEDANCE MEASURED VALUE: 209 Ohm
HighPow Impedance: 63 Ohm
Lead Channel Impedance Value: 437 Ohm
Lead Channel Pacing Threshold Amplitude: 0.75 V
Lead Channel Pacing Threshold Amplitude: 1.625 V
Lead Channel Pacing Threshold Pulse Width: 0.4 ms
Lead Channel Pacing Threshold Pulse Width: 0.4 ms
Lead Channel Pacing Threshold Pulse Width: 0.4 ms
Lead Channel Sensing Intrinsic Amplitude: 26.75 mV
Lead Channel Sensing Intrinsic Amplitude: 6.875 mV
Lead Channel Setting Pacing Amplitude: 2.25 V
Lead Channel Setting Pacing Amplitude: 3.5 V
Lead Channel Setting Pacing Amplitude: 3.5 V
Lead Channel Setting Pacing Pulse Width: 0.4 ms
Lead Channel Setting Pacing Pulse Width: 0.4 ms
Lead Channel Setting Sensing Sensitivity: 0.3 mV
MDC IDC MSMT LEADCHNL RA IMPEDANCE VALUE: 494 Ohm
MDC IDC MSMT LEADCHNL RA PACING THRESHOLD AMPLITUDE: 0.375 V
MDC IDC MSMT LEADCHNL RA SENSING INTR AMPL: 3.25 mV
MDC IDC MSMT LEADCHNL RV SENSING INTR AMPL: 20.875 mV
MDC IDC SESS DTM: 20150518134327
MDC IDC SET ZONE DETECTION INTERVAL: 350 ms
MDC IDC SET ZONE DETECTION INTERVAL: 350 ms
MDC IDC STAT BRADY AP VS PERCENT: 0.84 %
MDC IDC STAT BRADY AS VP PERCENT: 55.89 %
MDC IDC STAT BRADY RV PERCENT PACED: 16.81 %
Zone Setting Detection Interval: 300 ms
Zone Setting Detection Interval: 360 ms

## 2013-09-24 NOTE — Progress Notes (Signed)
Wound check appointment. Steri-strips removed. Wound without redness or edema. Incision edges approximated, wound well healed. Normal device function. Thresholds, sensing, and impedances consistent with implant measurements. Device programmed at 3.5V for extra safety margin until 3 month visit. Histogram distribution appropriate for patient and level of activity. No mode switches or ventricular arrhythmias noted. Patient educated about wound care, arm mobility, lifting restrictions, shock plan. ROV Dr. Johney Frame 12/19/13. Enrolled in ICM clinic.

## 2013-09-28 ENCOUNTER — Encounter: Payer: Self-pay | Admitting: Internal Medicine

## 2013-10-02 ENCOUNTER — Telehealth: Payer: Self-pay | Admitting: Cardiovascular Disease

## 2013-10-12 NOTE — Telephone Encounter (Signed)
Closed enocunter °

## 2013-10-15 ENCOUNTER — Ambulatory Visit (INDEPENDENT_AMBULATORY_CARE_PROVIDER_SITE_OTHER): Payer: Medicare Other | Admitting: *Deleted

## 2013-10-15 DIAGNOSIS — I509 Heart failure, unspecified: Secondary | ICD-10-CM

## 2013-10-15 NOTE — Progress Notes (Signed)
Left message on patient's machine.  Today was a 1 month follow up call post CRTD implantation.  Asked patient to call back at his convenience.

## 2013-10-18 DIAGNOSIS — Z1331 Encounter for screening for depression: Secondary | ICD-10-CM | POA: Insufficient documentation

## 2013-10-25 ENCOUNTER — Ambulatory Visit: Payer: Medicare Other | Admitting: Cardiovascular Disease

## 2013-10-26 ENCOUNTER — Encounter: Payer: Self-pay | Admitting: Cardiovascular Disease

## 2013-10-26 ENCOUNTER — Ambulatory Visit (INDEPENDENT_AMBULATORY_CARE_PROVIDER_SITE_OTHER): Payer: Medicare Other | Admitting: Cardiovascular Disease

## 2013-10-26 VITALS — BP 132/61 | HR 66 | Ht 74.0 in | Wt 199.7 lb

## 2013-10-26 DIAGNOSIS — I779 Disorder of arteries and arterioles, unspecified: Secondary | ICD-10-CM

## 2013-10-26 DIAGNOSIS — E785 Hyperlipidemia, unspecified: Secondary | ICD-10-CM

## 2013-10-26 DIAGNOSIS — I10 Essential (primary) hypertension: Secondary | ICD-10-CM

## 2013-10-26 DIAGNOSIS — I739 Peripheral vascular disease, unspecified: Secondary | ICD-10-CM

## 2013-10-26 DIAGNOSIS — I428 Other cardiomyopathies: Secondary | ICD-10-CM

## 2013-10-26 NOTE — Patient Instructions (Signed)
  We will see you back in follow up in 6 months with Dr Allyson Sabal.   Dr Allyson Sabal has ordered an  Echocardiogram to be done in September. Echocardiography is a painless test that uses sound waves to create images of your heart. It provides your doctor with information about the size and shape of your heart and how well your heart's chambers and valves are working. This procedure takes approximately one hour. There are no restrictions for this procedure.

## 2013-10-26 NOTE — Assessment & Plan Note (Signed)
History of bilateral iliac stenting as well as left renal artery stenting back in 2007. He does have moderate SFA disease bilaterally. We will continue to follow his renal and lower extremity arterial Doppler studies.

## 2013-10-26 NOTE — Assessment & Plan Note (Signed)
Moderate by duplex ultrasound, neurologically asymptomatic. We will continue to follow him noninvasively.

## 2013-10-26 NOTE — Assessment & Plan Note (Signed)
On statin therapy followed by his PCP 

## 2013-10-26 NOTE — Assessment & Plan Note (Signed)
Nonischemic cardiopathy with a recent 2-D echo revealing an EF of 15-20% status post ICD ICD implantation by Dr. Hillis Range. He does have a left bundle branch block and hopefully he will be a CRT responder.

## 2013-10-26 NOTE — Assessment & Plan Note (Signed)
Well-controlled on current medications 

## 2013-10-26 NOTE — Progress Notes (Signed)
10/26/2013 Scott Parsons   December 26, 1929  [id number]  Primary Physician [name], MD Primary Cardiologist: [name] MD Scott Parsons   HPI:  The patient is a very pleasant 80 year old, mildly overweight, married Caucasian male, father of 3, grandfather to 7 grandchildren who I saw in the office 1 year ago. He has a history of noncritical CAD by cath which I performed in 2003 with mild LV dysfunction. At that time, he had apical and inferoapical wall motion abnormalities and EF of 45%. He has PVOD with bilateral carotid disease left greater than right which we follow by duplex ultrasound. He is neurologically asymptomatic. I stented his left renal artery October 18, 2005, as well as both iliac arteries. He does have moderate SFA disease bilaterally. He does complain of some hip pain when walking on a treadmill or walking up an incline, but rides a bike without limitation. His other problems include hypertension and hyperlipidemia. His last Myoview performed December 08, 2009, revealed inferior scar. Dr. [name] follows his lipid profile. His most recent Dopplers reveal stable internal carotid artery stenosis left greater than right, as well as patent iliac stents with probably mild to moderate in-stent restenosis with ABIs of 0.89 on the right and 0.79 on the left.  Saw him last a/2/13 he has had admissions for congestive heart failure at [facility] in March of this year probably related to dietary indiscretion. He spent today's in the hospital I was direst. He had recent renal Doppler studies that showed progression of disease on left side suggesting "in-stent restenosis. He denies chest pain or shortness of breath now. Does have lifestyle limiting claudication.  I formed a 2-D echocardiogram on him 09/16/12 revealing an EF of 25-30% with a dilated left ventricle.in addition, renal Doppler suggest progression of left renal artery stenosis suggesting "in-stent restenosis who left  renal aortic ratio of 4.72.which has increased recently to 5.11. He denies chest pain or shortness of breath. I referred him to Dr. Bosie Helper for consideration of ICD therapy for primary prevention plus or minus CRT therapy in light of to prevent pocket. This was performed last month without complication.    Current Outpatient Prescriptions  Medication Sig Dispense Refill  . allopurinol (ZYLOPRIM) 100 MG tablet Take 100 mg by mouth daily.      [name] ammonium lactate (LAC-HYDRIN) 12 % lotion Apply 1 application topically daily. Pt applies to feet after each shower      . aspirin EC 81 MG tablet Take 81 mg by mouth daily.      [name] atorvastatin (LIPITOR) 20 MG tablet Take 20 mg by mouth daily.      . clopidogrel (PLAVIX) 75 MG tablet Take 75 mg by mouth daily.      . furosemide (LASIX) 40 MG tablet Take 1 tablet (40 mg total) by mouth daily. Take 1 1/2 tablets daily  30 tablet  2  . isosorbide mononitrate (IMDUR) 30 MG 24 hr tablet Take 30 mg by mouth daily.      [name] lisinopril (PRINIVIL,ZESTRIL) 40 MG tablet Take 0.5 tablets (20 mg total) by mouth daily.  30 tablet  11  . Magnesium 500 MG CAPS Take 500 mg by mouth daily.       . metoprolol (TOPROL-XL) 100 MG 24 hr tablet Take 1 tablet (100 mg total) by mouth daily.  30 tablet  2  . niacin 500 MG tablet Take 500 mg by mouth daily with breakfast.      .  NITROSTAT 0.4 MG SL tablet Place 0.4 mg under the tongue every 5 (five) minutes as needed.       . NON FORMULARY Wears O2 at night (2 L)      . spironolactone (ALDACTONE) 25 MG tablet Take 12.5 mg by mouth daily.      [name]. zolpidem (AMBIEN) 10 MG tablet Take 5 mg by mouth at bedtime.        No current facility-administered medications for this visit.    Allergies  Allergen Reactions  . Amoxicillin Itching    Itching    . Biaxin [Clarithromycin] Itching    Lip swelling     . Erythromycin Itching  . Penicillins Itching  . Shellfish Allergy Itching          History   Social History  .  Marital Status: Married    Spouse Name: N/A    Number of Children: N/A  . Years of Education: N/A   Occupational History  . Not on file.   Social History Main Topics  . Smoking status: Former Smoker -- 52 years    Types: Cigarettes    Quit date: 08/11/2004  . Smokeless tobacco: Never Used  . Alcohol Use: No  . Drug Use: No  . Sexual Activity: Not on file   Other Topics Concern  . Not on file   Social History Narrative   Retired     Review of Systems: General: negative for chills, fever, night sweats or weight changes.  Cardiovascular: negative for chest pain, dyspnea on exertion, edema, orthopnea, palpitations, paroxysmal nocturnal dyspnea or shortness of breath Dermatological: negative for rash Respiratory: negative for cough or wheezing Urologic: negative for hematuria Abdominal: negative for nausea, vomiting, diarrhea, bright red blood per rectum, melena, or hematemesis Neurologic: negative for visual changes, syncope, or dizziness All other systems reviewed and are otherwise negative except as noted above.    Blood pressure 132/61, pulse 66, height 6\' 2"  (1.88 m), weight 199 lb 11.2 oz (90.583 kg).  General appearance: alert and no distress Neck: no adenopathy, no JVD, supple, symmetrical, trachea midline, thyroid not enlarged, symmetric, no tenderness/mass/nodules and soft bilateral carotid bruits Lungs: clear to auscultation bilaterally Heart: regular rate and rhythm, S1, S2 normal, no murmur, click, rub or gallop Extremities: extremities normal, atraumatic, no cyanosis or edema  EKG not performed today  ASSESSMENT AND PLAN:   Chronic combined systolic and diastolic congestive heart failure, NYHA class 2 Nonischemic cardiopathy with a recent 2-D echo revealing an EF of 15-20% status post ICD ICD implantation by Dr. [name]. He does have a left bundle branch block and hopefully he will be a CRT responder.  Carotid artery disease Moderate by duplex  ultrasound, neurologically asymptomatic. We will continue to follow him noninvasively.  Peripheral arterial disease History of bilateral iliac stenting as well as left renal artery stenting back in 2007. He does have moderate SFA disease bilaterally. We will continue to follow his renal and lower extremity arterial Doppler studies.  Hyperlipidemia On statin therapy followed by his PCP  Essential hypertension Well-controlled on current medications      Runell GessJonathan J. Berry MD [facility] Medical CenterFACP,FACC,FAHA, [facility] Community HospitalFSCAI 10/26/2013 5:02 PM

## 2013-12-07 ENCOUNTER — Other Ambulatory Visit (HOSPITAL_COMMUNITY): Payer: Self-pay | Admitting: Internal Medicine

## 2013-12-11 ENCOUNTER — Telehealth: Payer: Self-pay | Admitting: Cardiovascular Disease

## 2013-12-11 ENCOUNTER — Other Ambulatory Visit: Payer: Self-pay | Admitting: Internal Medicine

## 2013-12-11 NOTE — Telephone Encounter (Signed)
Should this patient be taking 1 or 1.5 qd? Please advise. Thanks, MI

## 2013-12-12 NOTE — Telephone Encounter (Signed)
Dr Hazle Coca note says 1 1 /2

## 2013-12-12 NOTE — Telephone Encounter (Signed)
Rx refill sent to patient pharmacy   

## 2013-12-12 NOTE — Telephone Encounter (Signed)
Dr Allyson Sabal changed the dose on this.

## 2013-12-13 NOTE — Telephone Encounter (Signed)
Closed encounter °

## 2013-12-19 ENCOUNTER — Encounter: Payer: Self-pay | Admitting: Internal Medicine

## 2013-12-19 ENCOUNTER — Ambulatory Visit (INDEPENDENT_AMBULATORY_CARE_PROVIDER_SITE_OTHER): Payer: Medicare Other | Admitting: Internal Medicine

## 2013-12-19 VITALS — BP 128/64 | HR 78 | Ht 74.0 in | Wt 195.0 lb

## 2013-12-19 DIAGNOSIS — I5042 Chronic combined systolic (congestive) and diastolic (congestive) heart failure: Secondary | ICD-10-CM

## 2013-12-19 DIAGNOSIS — I428 Other cardiomyopathies: Secondary | ICD-10-CM

## 2013-12-19 DIAGNOSIS — I5022 Chronic systolic (congestive) heart failure: Secondary | ICD-10-CM

## 2013-12-19 DIAGNOSIS — I509 Heart failure, unspecified: Secondary | ICD-10-CM

## 2013-12-19 DIAGNOSIS — I447 Left bundle-branch block, unspecified: Secondary | ICD-10-CM

## 2013-12-19 DIAGNOSIS — I10 Essential (primary) hypertension: Secondary | ICD-10-CM

## 2013-12-19 LAB — MDC_IDC_ENUM_SESS_TYPE_INCLINIC
Battery Remaining Longevity: 111 mo
Brady Statistic AP VS Percent: 0.96 %
Brady Statistic AS VP Percent: 45.57 %
Brady Statistic RA Percent Paced: 53.27 %
Date Time Interrogation Session: 20150812134406
HighPow Impedance: 209 Ohm
HighPow Impedance: 65 Ohm
Lead Channel Impedance Value: 513 Ohm
Lead Channel Pacing Threshold Amplitude: 0.75 V
Lead Channel Pacing Threshold Pulse Width: 0.4 ms
Lead Channel Pacing Threshold Pulse Width: 0.4 ms
Lead Channel Sensing Intrinsic Amplitude: 3.375 mV
Lead Channel Sensing Intrinsic Amplitude: 5.375 mV
Lead Channel Setting Pacing Amplitude: 1.5 V
Lead Channel Setting Pacing Amplitude: 1.75 V
MDC IDC MSMT BATTERY VOLTAGE: 3.07 V
MDC IDC MSMT LEADCHNL RA PACING THRESHOLD AMPLITUDE: 0.625 V
MDC IDC MSMT LEADCHNL RA PACING THRESHOLD PULSEWIDTH: 0.4 ms
MDC IDC MSMT LEADCHNL RV IMPEDANCE VALUE: 475 Ohm
MDC IDC MSMT LEADCHNL RV PACING THRESHOLD AMPLITUDE: 0.75 V
MDC IDC MSMT LEADCHNL RV SENSING INTR AMPL: 27.75 mV
MDC IDC MSMT LEADCHNL RV SENSING INTR AMPL: 29.5 mV
MDC IDC SET LEADCHNL LV PACING PULSEWIDTH: 0.4 ms
MDC IDC SET LEADCHNL RV PACING AMPLITUDE: 2 V
MDC IDC SET LEADCHNL RV PACING PULSEWIDTH: 0.4 ms
MDC IDC SET LEADCHNL RV SENSING SENSITIVITY: 0.3 mV
MDC IDC SET ZONE DETECTION INTERVAL: 350 ms
MDC IDC SET ZONE DETECTION INTERVAL: 360 ms
MDC IDC STAT BRADY AP VP PERCENT: 52.3 %
MDC IDC STAT BRADY AS VS PERCENT: 1.16 %
MDC IDC STAT BRADY RV PERCENT PACED: 22.97 %
Zone Setting Detection Interval: 300 ms
Zone Setting Detection Interval: 350 ms

## 2013-12-19 NOTE — Patient Instructions (Addendum)
Remote monitoring is used to monitor your  ICD from home. This monitoring reduces the number of office visits required to check your device to one time per year. It allows [place] to keep an eye on the functioning of your device to ensure it is working properly. You are scheduled for a device check from home on 03-25-2014. You may send your transmission at any time that day. If you have a wireless device, the transmission will be sent automatically. After your physician reviews your transmission, you will receive a postcard with your next transmission date.  Your physician recommends that you schedule a follow-up appointment in: May 2016 with Dr.Allred  Your physician recommends that you continue on your current medications as directed. Please refer to the Current Medication list given to you today.  Sherri Rad will be in touch regarding continuing monitoring of your device.  Please refer to the letter given to you today.

## 2013-12-19 NOTE — Progress Notes (Signed)
PCP: Lindwood QuaHOFFMAN,BYRON, MD Primary Cardiologist:  Dr Bettina GaviaBerry  Scott Parsons is a 80 y.o. male who presents today for routine electrophysiology followup.  Since his recent CRT-D implant, the patient reports doing very well.  He thinks that his chronic SOB is slightly improved. Today, he denies symptoms of palpitations, chest pain,  lower extremity edema, dizziness, presyncope, syncope, or ICD shocks.  The patient is otherwise without complaint today.   Past Medical History  Diagnosis Date  . CAD (coronary artery disease)   . Hypertension   . Hyperlipidemia   . History of stress test 12/08/2009    revealed inferior scar.  Scott Parsons. Hx of Doppler ultrasound     reveal stable internal carotid artery stenosis left greater than right, as well as patent iliac stents with probably mild to moderate in-stent restenosis with ABIs of 0.89 on the right and 0.79 on the left.  Scott Parsons. Hx of echocardiogram 01/18/2005    EF 35-45% with mild MR and TR and his last functional study performed 8/05 revealed inferior apical and lateral scar with LV dilation.  . OSA (obstructive sleep apnea)     On C-pap  . PVD (peripheral vascular disease)     with right external iliac artery angioplasty and left renal artery angioplasty and stenting.  . Renal artery stenosis   . History of renal stent   . History of stroke   . Carotid artery disease   . Nonischemic cardiomyopathy   . Chronic systolic dysfunction of left ventricle     EF 25% by echo 9/14  . LBBB (left bundle branch block)   . CHF (congestive heart failure)   . TIA (transient ischemic attack) 06/2010  . Pre-diabetes   . ICD (implantable cardioverter-defibrillator), dual, in situ    Past Surgical History  Procedure Laterality Date  . Cardiac catheterization  2003    Mild LV dysfuntion. he had apical and inferoapical wall motion abnormalities and EF of 45%.  . Cardiac catheterization  10/18/2005    STENTS: left renal artery as well as both iliac arteries. Was stented  with 12mm x 4mm Smart Nitinol self-expanding stent replaced with a 9mm x 4mm balloon at nominal pressures, resulting in a reduction od 50% proximal right external iliac artery  stenosis to 0% residual.  . Hand surgery  01/1995  . Hernia repair    . Bi-ventricular implantable cardioverter defibrillator  (crt-d)  09-13-2013    MDT CRTD implanted by Dr Johney FrameAllred    ROS- all systems are reviewed and negative except as per HPI above  Current Outpatient Prescriptions  Medication Sig Dispense Refill  . allopurinol (ZYLOPRIM) 100 MG tablet Take 100 mg by mouth daily.      Scott Parsons. aspirin EC 81 MG tablet Take 81 mg by mouth daily.      Scott Parsons. atorvastatin (LIPITOR) 20 MG tablet Take 20 mg by mouth daily.      . candesartan (ATACAND) 16 MG tablet Take 0.25 tablets by mouth daily.      . clopidogrel (PLAVIX) 75 MG tablet Take 75 mg by mouth daily.      . furosemide (LASIX) 40 MG tablet TAKE 1 TABLET BY MOUTH DAILY.  30 tablet  5  . isosorbide mononitrate (IMDUR) 30 MG 24 hr tablet Take 30 mg by mouth daily.      Scott Parsons. lisinopril (PRINIVIL,ZESTRIL) 40 MG tablet Take 0.5 tablets (20 mg total) by mouth daily.  30 tablet  11  . Magnesium 500 MG CAPS Take 500 mg by  mouth daily.       . metoprolol succinate (TOPROL-XL) 100 MG 24 hr tablet TAKE 1 TABLET BY MOUTH DAILY.  30 tablet  2  . niacin 500 MG tablet Take 500 mg by mouth daily with breakfast.      . NITROSTAT 0.4 MG SL tablet Place 0.4 mg under the tongue every 5 (five) minutes as needed.       . NON FORMULARY Wears O2 at night (2 L)      . NON FORMULARY Use as directed - [employer] diabetic [employer]inc[employer]      . spironolactone (ALDACTONE) 25 MG tablet Take 12.5 mg by mouth daily.      Scott Parsons zolpidem (AMBIEN) 10 MG tablet Take 5 mg by mouth at bedtime.        No current facility-administered medications for this visit.    Physical Exam: Filed Vitals:   12/19/13 1101  BP: 128/64  Pulse: 78  Height: 6\' 2"  (1.88 m)  Weight: 195 lb (88.451 kg)    GEN- The patient is well  appearing, alert and oriented x 3 today.   Head- normocephalic, atraumatic Eyes-  Sclera clear, conjunctiva pink Ears- hearing intact Oropharynx- clear Lungs- Clear to ausculation bilaterally, normal work of breathing Chest- ICD pocket is well healed Heart- Regular rate and rhythm, no murmurs, rubs or gallops, PMI not laterally displaced GI- soft, NT, ND, + BS Extremities- no clubbing, cyanosis, or edema  ICD interrogation- reviewed in detail today,  See PACEART report  Assessment and Plan:  1.  Chronic systolic dysfunction euvolemic today Stable on an appropriate medical regimen Normal ICD function See Pace Art report No changes today Would obtain echo in 2-3 months assess response to CRT (already ordered by Dr Allyson Sabal)  Sherri Rad to enroll in [facility] (pt is interested) carelink Follow-up with Dr Allyson Sabal as scheduled I will see again in a year

## 2013-12-24 ENCOUNTER — Encounter: Payer: Self-pay | Admitting: *Deleted

## 2014-01-02 ENCOUNTER — Encounter: Payer: Self-pay | Admitting: Internal Medicine

## 2014-01-21 ENCOUNTER — Encounter: Payer: Self-pay | Admitting: *Deleted

## 2014-01-21 ENCOUNTER — Ambulatory Visit (INDEPENDENT_AMBULATORY_CARE_PROVIDER_SITE_OTHER): Payer: Medicare Other | Admitting: *Deleted

## 2014-01-21 DIAGNOSIS — I5022 Chronic systolic (congestive) heart failure: Secondary | ICD-10-CM

## 2014-01-21 DIAGNOSIS — I509 Heart failure, unspecified: Secondary | ICD-10-CM

## 2014-01-21 DIAGNOSIS — Z95810 Presence of automatic (implantable) cardiac defibrillator: Secondary | ICD-10-CM

## 2014-01-21 NOTE — Progress Notes (Signed)
EPIC Encounter for ICM Monitoring  Patient Name: Scott Parsons is a 80 y.o. male Date: 01/21/2014 Primary Care Physican: [name], MD Primary Cardiologist: Allyson Sabal Electrophysiologist: Allred Dry Weight: 195 lbs       In the past month, have you:  1. Gained more than 2 pounds in a day or more than 5 pounds in a week? no  2. Had changes in your medications (with verification of current medications)? no  3. Had more shortness of breath than is usual for you? No. Occasional SOB with walking up an incline.  4. Limited your activity because of shortness of breath? no  5. Not been able to sleep because of shortness of breath? no  6. Had increased swelling in your feet or ankles? no  7. Had symptoms of dehydration (dizziness, dry mouth, increased thirst, decreased urine output) no. Occasional dizziness with standing.  8. Had changes in sodium restriction? no  9. Been compliant with medication? Yes   ICM trend:   Follow-up plan: ICM clinic phone appointment: 02/21/14  Copy of note sent to patient's primary care physician, primary cardiologist, and device following physician.  Sherri Rad, RN, BSN 01/21/2014 12:02 PM

## 2014-02-21 ENCOUNTER — Ambulatory Visit (INDEPENDENT_AMBULATORY_CARE_PROVIDER_SITE_OTHER): Payer: Medicare Other | Admitting: *Deleted

## 2014-02-21 ENCOUNTER — Encounter: Payer: Self-pay | Admitting: *Deleted

## 2014-02-21 ENCOUNTER — Telehealth: Payer: Self-pay | Admitting: *Deleted

## 2014-02-21 DIAGNOSIS — I5022 Chronic systolic (congestive) heart failure: Secondary | ICD-10-CM

## 2014-02-21 DIAGNOSIS — Z95810 Presence of automatic (implantable) cardiac defibrillator: Secondary | ICD-10-CM

## 2014-02-21 NOTE — Telephone Encounter (Signed)
I spoke with the patient. 

## 2014-02-21 NOTE — Progress Notes (Signed)
EPIC Encounter for ICM Monitoring  Patient Name: Scott Parsons is a 80 y.o. male Date: 02/21/2014 Primary Care Physican: [name], MD Primary Cardiologist: Allyson Sabal Electrophysiologist: Allred Dry Weight: 191 lbs       In the past month, have you:  1. Gained more than 2 pounds in a day or more than 5 pounds in a week? no  2. Had changes in your medications (with verification of current medications)? no  3. Had more shortness of breath than is usual for you? no  4. Limited your activity because of shortness of breath? no  5. Not been able to sleep because of shortness of breath? no  6. Had increased swelling in your feet or ankles? no  7. Had symptoms of dehydration (dizziness, dry mouth, increased thirst, decreased urine output) no. The patient has some dizziness on occasion when standing. This has been going on for a year. BP readings at Cardiac Rehab are usually 130's/60-70's.  8. Had changes in sodium restriction? no  9. Been compliant with medication? Yes   ICM trend:   Follow-up plan: ICM clinic phone appointment: 03/25/14  Copy of note sent to patient's primary care physician, primary cardiologist, and device following physician.  Sherri Rad, RN, BSN 02/21/2014 2:58 PM

## 2014-02-21 NOTE — Telephone Encounter (Signed)
ICM transmission received. I left a message for the patient to call. 

## 2014-03-25 ENCOUNTER — Ambulatory Visit (INDEPENDENT_AMBULATORY_CARE_PROVIDER_SITE_OTHER): Payer: Medicare Other | Admitting: *Deleted

## 2014-03-25 ENCOUNTER — Telehealth: Payer: Self-pay | Admitting: Cardiology

## 2014-03-25 DIAGNOSIS — Z95810 Presence of automatic (implantable) cardiac defibrillator: Secondary | ICD-10-CM

## 2014-03-25 DIAGNOSIS — I5042 Chronic combined systolic (congestive) and diastolic (congestive) heart failure: Secondary | ICD-10-CM

## 2014-03-25 DIAGNOSIS — I429 Cardiomyopathy, unspecified: Secondary | ICD-10-CM

## 2014-03-25 DIAGNOSIS — I428 Other cardiomyopathies: Secondary | ICD-10-CM

## 2014-03-25 LAB — MDC_IDC_ENUM_SESS_TYPE_REMOTE
Battery Voltage: 3.03 V
Brady Statistic AP VP Percent: 71.96 %
Brady Statistic AP VS Percent: 1.35 %
Brady Statistic RA Percent Paced: 73.3 %
HIGH POWER IMPEDANCE MEASURED VALUE: 79 Ohm
Lead Channel Impedance Value: 342 Ohm
Lead Channel Impedance Value: 418 Ohm
Lead Channel Impedance Value: 494 Ohm
Lead Channel Impedance Value: 513 Ohm
Lead Channel Impedance Value: 589 Ohm
Lead Channel Impedance Value: 703 Ohm
Lead Channel Impedance Value: 722 Ohm
Lead Channel Pacing Threshold Amplitude: 1.125 V
Lead Channel Pacing Threshold Pulse Width: 0.4 ms
Lead Channel Pacing Threshold Pulse Width: 0.4 ms
Lead Channel Pacing Threshold Pulse Width: 0.4 ms
Lead Channel Sensing Intrinsic Amplitude: 2.375 mV
Lead Channel Sensing Intrinsic Amplitude: 2.375 mV
Lead Channel Setting Pacing Amplitude: 2 V
Lead Channel Setting Pacing Amplitude: 2.25 V
Lead Channel Setting Pacing Amplitude: 2.5 V
Lead Channel Setting Pacing Pulse Width: 0.4 ms
Lead Channel Setting Pacing Pulse Width: 0.4 ms
Lead Channel Setting Sensing Sensitivity: 0.3 mV
MDC IDC MSMT BATTERY REMAINING LONGEVITY: 103 mo
MDC IDC MSMT LEADCHNL LV IMPEDANCE VALUE: 399 Ohm
MDC IDC MSMT LEADCHNL LV IMPEDANCE VALUE: 475 Ohm
MDC IDC MSMT LEADCHNL LV IMPEDANCE VALUE: 589 Ohm
MDC IDC MSMT LEADCHNL LV IMPEDANCE VALUE: 703 Ohm
MDC IDC MSMT LEADCHNL RA IMPEDANCE VALUE: 570 Ohm
MDC IDC MSMT LEADCHNL RA PACING THRESHOLD AMPLITUDE: 0.75 V
MDC IDC MSMT LEADCHNL RV IMPEDANCE VALUE: 418 Ohm
MDC IDC MSMT LEADCHNL RV PACING THRESHOLD AMPLITUDE: 0.625 V
MDC IDC MSMT LEADCHNL RV SENSING INTR AMPL: 23.125 mV
MDC IDC MSMT LEADCHNL RV SENSING INTR AMPL: 23.125 mV
MDC IDC SESS DTM: 20151116201302
MDC IDC SET ZONE DETECTION INTERVAL: 350 ms
MDC IDC STAT BRADY AS VP PERCENT: 25.84 %
MDC IDC STAT BRADY AS VS PERCENT: 0.86 %
MDC IDC STAT BRADY RV PERCENT PACED: 45.39 %
Zone Setting Detection Interval: 300 ms
Zone Setting Detection Interval: 350 ms
Zone Setting Detection Interval: 360 ms

## 2014-03-25 NOTE — Telephone Encounter (Signed)
Spoke with pt and reminded pt of remote transmission that is due today. Pt verbalized understanding.   

## 2014-03-26 ENCOUNTER — Telehealth: Payer: Self-pay | Admitting: Internal Medicine

## 2014-03-26 NOTE — Telephone Encounter (Signed)
Informed pt that we did receive transmission.  

## 2014-03-26 NOTE — Telephone Encounter (Signed)
PT CALLING FOR BARBARA, PT CALLING TO SEE IF WE GOT TRANSMISSION, DOES PT HAVE ANY FURTHER TEST THAT NEES  TO BE DONE

## 2014-03-26 NOTE — Progress Notes (Signed)
Remote ICD transmission.   

## 2014-03-27 ENCOUNTER — Encounter: Payer: Self-pay | Admitting: *Deleted

## 2014-03-27 NOTE — Progress Notes (Signed)
EPIC Encounter for ICM Monitoring  Patient Name: Scott Parsons is a 80 y.o. male Date: 03/27/2014 Primary Care Physican: [name], MD Primary Cardiologist: Allyson Sabal Electrophysiologist: Allred Dry Weight: 191 lbs       In the past month, have you:  1. Gained more than 2 pounds in a day or more than 5 pounds in a week? No. The patient states his weight is up to 194 lbs today. This has been a steady increase for him. He states that his appetite has picked up some.  2. Had changes in your medications (with verification of current medications)? no  3. Had more shortness of breath than is usual for you? no  4. Limited your activity because of shortness of breath? no  5. Not been able to sleep because of shortness of breath? no  6. Had increased swelling in your feet or ankles? no  7. Had symptoms of dehydration (dizziness, dry mouth, increased thirst, decreased urine output) no  8. Had changes in sodium restriction? no  9. Been compliant with medication? Yes   ICM trend:   Follow-up plan: ICM clinic phone appointment: 04/29/14  Copy of note sent to patient's primary care physician, primary cardiologist, and device following physician.  Sherri Rad, RN, BSN 03/27/2014 1:00 PM

## 2014-03-27 NOTE — Addendum Note (Signed)
Addended by: Sherri Rad C on: 03/27/2014 01:07 PM   Modules accepted: Orders, Medications, Level of Service

## 2014-04-01 ENCOUNTER — Encounter: Payer: Self-pay | Admitting: Cardiology

## 2014-04-03 ENCOUNTER — Encounter: Payer: Self-pay | Admitting: Internal Medicine

## 2014-04-12 ENCOUNTER — Other Ambulatory Visit: Payer: Self-pay | Admitting: Cardiovascular Disease

## 2014-04-12 NOTE — Telephone Encounter (Signed)
Rx was sent to pharmacy electronically. 

## 2014-04-18 ENCOUNTER — Encounter (HOSPITAL_COMMUNITY): Payer: Self-pay | Admitting: Internal Medicine

## 2014-04-23 ENCOUNTER — Encounter: Payer: Self-pay | Admitting: Cardiovascular Disease

## 2014-04-23 ENCOUNTER — Ambulatory Visit (INDEPENDENT_AMBULATORY_CARE_PROVIDER_SITE_OTHER): Payer: Medicare Other | Admitting: Cardiovascular Disease

## 2014-04-23 VITALS — BP 90/58 | HR 73 | Ht 74.0 in | Wt 201.1 lb

## 2014-04-23 DIAGNOSIS — I5042 Chronic combined systolic (congestive) and diastolic (congestive) heart failure: Secondary | ICD-10-CM

## 2014-04-23 DIAGNOSIS — I10 Essential (primary) hypertension: Secondary | ICD-10-CM

## 2014-04-23 DIAGNOSIS — I739 Peripheral vascular disease, unspecified: Secondary | ICD-10-CM

## 2014-04-23 DIAGNOSIS — I779 Disorder of arteries and arterioles, unspecified: Secondary | ICD-10-CM

## 2014-04-23 DIAGNOSIS — E785 Hyperlipidemia, unspecified: Secondary | ICD-10-CM

## 2014-04-23 DIAGNOSIS — I429 Cardiomyopathy, unspecified: Secondary | ICD-10-CM

## 2014-04-23 DIAGNOSIS — I701 Atherosclerosis of renal artery: Secondary | ICD-10-CM

## 2014-04-23 DIAGNOSIS — I428 Other cardiomyopathies: Secondary | ICD-10-CM

## 2014-04-23 NOTE — Assessment & Plan Note (Signed)
History of bilateral iliac stenting with moderate SFA disease as well. His left lower extremity Doppler studies were performed 01/25/13 revealing moderate iliac disease. He does have hip claudication at 500 feet. We will recheck lower extremity arterial Doppler studies

## 2014-04-23 NOTE — Assessment & Plan Note (Signed)
History of hypertension with blood pressure measurements at 90/58. He is on adequate candidate, isosorbide, lisinopril 40,metoprolol. Continue current meds at current dosing

## 2014-04-23 NOTE — Assessment & Plan Note (Signed)
History of renal artery stenosis followed by duplex ultrasound

## 2014-04-23 NOTE — Assessment & Plan Note (Signed)
History of hyperlipidemia on atorvastatin 20 mg a day followed by his PCP 

## 2014-04-23 NOTE — Patient Instructions (Signed)
Echocardiogram. Echocardiography is a painless test that uses sound waves to create images of your heart. It provides your doctor with information about the size and shape of your heart and how well your heart's chambers and valves are working. This procedure takes approximately one hour. There are no restrictions for this procedure.  Lower extremity arterial doppler- During this test, ultrasound is used to evaluate arterial blood flow in the legs. Allow approximately one hour for this exam.   Carotid Doppler- This test is an ultrasound of the carotid arteries in your neck. It looks at blood flow through these arteries that supply the brain with blood. Allow one hour for this exam. There are no restrictions or special instructions.  Renal Artery Doppler- During this test, an ultrasound is used to evaluate blood flow to the kidneys. Allow one hour for this exam. Do not eat after midnight the day before and avoid carbonated beverages. Take your medications as you usually do.  Your physician wants you to follow-up in 6 months with Dr. Allyson Sabal. You will receive a reminder letter in the mail 2 months in advance. If you do not receive a letter, please call our office to schedule the follow-up appointment.

## 2014-04-23 NOTE — Assessment & Plan Note (Signed)
History of nonischemic cardiomyopathy with echocardiogram performed 09/06/13 revealing an EF of 15-20%. He ultimately underwent bi-V ICD implantation by Dr. Johney Frame 09/13/13 with a CRT device. He does feel clinically improved over the last several months. He is on appropriate therapy. I'm going to recheck a 2-D echocardiogram to see if he is a responder and has had improvement in LV function.

## 2014-04-23 NOTE — Progress Notes (Signed)
04/23/2014 Scott Parsons   [birth year]  [id number]  Primary Physician Lindwood QuaHOFFMAN,BYRON, MD Primary Cardiologist: Runell GessJonathan J. Janvi Ammar MD Roseanne RenoFACP,FACC,FAHA, FSCAI   HPI:  The patient is a very pleasant 80 year old, mildly overweight, married Caucasian male, father of 3, grandfather to 7 grandchildren who I saw in the office 6 months  ago. He has a history of noncritical CAD by cath which I performed in 2003 with mild LV dysfunction. At that time, he had apical and inferoapical wall motion abnormalities and EF of 45%. He has PVOD with bilateral carotid disease left greater than right which we follow by duplex ultrasound. He is neurologically asymptomatic. I stented his left renal artery October 18, 2005, as well as both iliac arteries. He does have moderate SFA disease bilaterally. He does complain of some hip pain when walking on a treadmill or walking up an incline, but rides a bike without limitation. His other problems include hypertension and hyperlipidemia. His last Myoview performed December 08, 2009, revealed inferior scar. Dr. Mikey BussingHoffman follows his lipid profile. His most recent Dopplers reveal stable internal carotid artery stenosis left greater than right, as well as patent iliac stents with probably mild to moderate in-stent restenosis with ABIs of 0.89 on the right and 0.79 on the left.  Saw him last a/2/13 he has had admissions for congestive heart failure at [facility] in March of this year probably related to dietary indiscretion. He spent today's in the hospital I was direst. He had recent renal Doppler studies that showed progression of disease on left side suggesting "in-stent restenosis. He denies chest pain or shortness of breath now. Does have lifestyle limiting claudication. His last lower extremity arterial Doppler studies were performed 01/25/13. He also has moderate asymmetric left internal carotid artery stenosis by 2+ ultrasound performed 11/08/11. Because her echocardiogram performed  09/06/13 revealing an ejection fraction of 15 and 20% he underwent biventricular ICD implantation by Dr. Hillis RangeJames Parsons 09/13/13 with a CRT device. He fell quickly improved over the last several months.    Current Outpatient Prescriptions  Medication Sig Dispense Refill  . allopurinol (ZYLOPRIM) 100 MG tablet Take 100 mg by mouth daily.    Scott Kitchen. aspirin EC 81 MG tablet Take 81 mg by mouth daily.    Scott Kitchen. atorvastatin (LIPITOR) 20 MG tablet Take 20 mg by mouth daily.    . candesartan (ATACAND) 8 MG tablet Take 1 tablet by mouth daily.  2  . clopidogrel (PLAVIX) 75 MG tablet Take 75 mg by mouth daily.    . furosemide (LASIX) 40 MG tablet Take 60 mg by mouth daily.     . isosorbide mononitrate (IMDUR) 30 MG 24 hr tablet Take 30 mg by mouth daily.    Scott Kitchen. lisinopril (PRINIVIL,ZESTRIL) 40 MG tablet Take 0.5 tablets (20 mg total) by mouth daily. 30 tablet 11  . Magnesium 500 MG CAPS Take 500 mg by mouth daily.     . metoprolol (TOPROL-XL) 200 MG 24 hr tablet Take 0.5 tablets by mouth daily. Takes along with Toprol 100 mg tablet  11  . metoprolol succinate (TOPROL-XL) 100 MG 24 hr tablet TAKE 1 TABLET BY MOUTH DAILY. (Patient taking differently: Take 0.5 tablets (50 mg total) by mouth daily.) 30 tablet 5  . niacin 500 MG tablet Take 500 mg by mouth daily with breakfast.    . NITROSTAT 0.4 MG SL tablet Place 0.4 mg under the tongue every 5 (five) minutes as needed.     . NON FORMULARY Wears O2  at night (2 L)    . NON FORMULARY Use as directed - Neoteric diabetic skincare    . spironolactone (ALDACTONE) 25 MG tablet Take 12.5 mg by mouth daily.    Scott Kitchen zolpidem (AMBIEN) 10 MG tablet Take 5 mg by mouth at bedtime.      No current facility-administered medications for this visit.    Allergies  Allergen Reactions  . Amoxicillin Itching    Itching    . Biaxin [Clarithromycin] Itching    Lip swelling     . Erythromycin Itching  . Penicillins Itching  . Shellfish Allergy Itching          History   Social  History  . Marital Status: Married    Spouse Name: N/A    Number of Children: N/A  . Years of Education: N/A   Occupational History  . Not on file.   Social History Main Topics  . Smoking status: Former Smoker -- 52 years    Types: Cigarettes    Quit date: 08/11/2004  . Smokeless tobacco: Never Used  . Alcohol Use: No  . Drug Use: No  . Sexual Activity: Not on file   Other Topics Concern  . Not on file   Social History Narrative   Retired     Review of Systems: General: negative for chills, fever, night sweats or weight changes.  Cardiovascular: negative for chest pain, dyspnea on exertion, edema, orthopnea, palpitations, paroxysmal nocturnal dyspnea or shortness of breath Dermatological: negative for rash Respiratory: negative for cough or wheezing Urologic: negative for hematuria Abdominal: negative for nausea, vomiting, diarrhea, bright red blood per rectum, melena, or hematemesis Neurologic: negative for visual changes, syncope, or dizziness All other systems reviewed and are otherwise negative except as noted above.    Blood pressure 90/58, pulse 73, height 6\' 2"  (1.88 m), weight 201 lb 1.6 oz (91.218 kg).  General appearance: alert and no distress Neck: no adenopathy, no JVD, supple, symmetrical, trachea midline, thyroid not enlarged, symmetric, no tenderness/mass/nodules and soft bilateral carotid bruits Lungs: clear to auscultation bilaterally Heart: regular rate and rhythm, S1, S2 normal, no murmur, click, rub or gallop Extremities: extremities normal, atraumatic, no cyanosis or edema  EKG atrially sensed, ventricularly paced rhythm at a rate of 73. I personally reviewed this EKG  ASSESSMENT AND PLAN:   Chronic combined systolic and diastolic congestive heart failure, NYHA class 2 History of nonischemic cardiomyopathy with echocardiogram performed 09/06/13 revealing an EF of 15-20%. He ultimately underwent bi-V ICD implantation by Dr. Johney Frame 09/13/13 with a CRT  device. He does feel clinically improved over the last several months. He is on appropriate therapy. I'm going to recheck a 2-D echocardiogram to see if he is a responder and has had improvement in LV function.  Essential hypertension History of hypertension with blood pressure measurements at 90/58. He is on adequate candidate, isosorbide, lisinopril 40,metoprolol. Continue current meds at current dosing  Carotid artery disease History of asymptomatic carotid disease with Dopplers last checked 11/08/11 revealing moderate left ICA stenosis. We will repeat carotid Doppler studies.  Peripheral arterial disease History of bilateral iliac stenting with moderate SFA disease as well. His left lower extremity Doppler studies were performed 01/25/13 revealing moderate iliac disease. He does have hip claudication at 500 feet. We will recheck lower extremity arterial Doppler studies  Renal artery stenosis History of renal artery stenosis followed by duplex ultrasound  Hyperlipidemia History of hyperlipidemia on atorvastatin 20 mg a day followed by his PCP  [name] MD FACP,FACC,FAHA, [facility] 04/23/2014 5:32 PM

## 2014-04-23 NOTE — Assessment & Plan Note (Signed)
History of asymptomatic carotid disease with Dopplers last checked 11/08/11 revealing moderate left ICA stenosis. We will repeat carotid Doppler studies.

## 2014-04-29 ENCOUNTER — Encounter: Payer: Self-pay | Admitting: *Deleted

## 2014-04-29 ENCOUNTER — Ambulatory Visit (INDEPENDENT_AMBULATORY_CARE_PROVIDER_SITE_OTHER): Payer: Medicare Other | Admitting: *Deleted

## 2014-04-29 DIAGNOSIS — I5022 Chronic systolic (congestive) heart failure: Secondary | ICD-10-CM

## 2014-04-29 DIAGNOSIS — Z95810 Presence of automatic (implantable) cardiac defibrillator: Secondary | ICD-10-CM

## 2014-04-29 NOTE — Progress Notes (Signed)
EPIC Encounter for ICM Monitoring  Patient Name: Scott Parsons is a 80 y.o. male Date: 04/29/2014 Primary Care Physican: [name], MD Primary Cardiologist: Allyson Sabal Electrophysiologist: Allred Dry Weight: 197 lbs       In the past month, have you:  1. Gained more than 2 pounds in a day or more than 5 pounds in a week? No. The patient's weight has gone up from ~ 194 lbs. He states he has slowly been gaining about a pound a week since Thanksgiving. We discussed dietary compliance and to exercise as much as possible. He is going to the hospital 3 days a week for cardiac rehab.  2. Had changes in your medications (with verification of current medications)? no  3. Had more shortness of breath than is usual for you? no  4. Limited your activity because of shortness of breath? no  5. Not been able to sleep because of shortness of breath? no  6. Had increased swelling in your feet or ankles? no  7. Had symptoms of dehydration (dizziness, dry mouth, increased thirst, decreased urine output) no  8. Had changes in sodium restriction? no  9. Been compliant with medication? Yes   ICM trend:   Follow-up plan: ICM clinic phone appointment: 05/30/14  Copy of note sent to patient's primary care physician, primary cardiologist, and device following physician.  Sherri Rad, RN, BSN 04/29/2014 3:27 PM

## 2014-05-01 ENCOUNTER — Ambulatory Visit (HOSPITAL_COMMUNITY): Payer: Medicare Other

## 2014-05-01 ENCOUNTER — Encounter (HOSPITAL_COMMUNITY): Payer: Medicare Other

## 2014-05-06 ENCOUNTER — Ambulatory Visit (HOSPITAL_COMMUNITY): Payer: Medicare Other

## 2014-05-07 ENCOUNTER — Ambulatory Visit (HOSPITAL_COMMUNITY)
Admission: RE | Admit: 2014-05-07 | Discharge: 2014-05-07 | Disposition: A | Discharge status: Home / Self Care | Payer: Medicare Other | Source: Ambulatory Visit | Attending: Cardiology | Admitting: Cardiology

## 2014-05-07 DIAGNOSIS — I509 Heart failure, unspecified: Secondary | ICD-10-CM | POA: Diagnosis not present

## 2014-05-07 DIAGNOSIS — I428 Other cardiomyopathies: Secondary | ICD-10-CM

## 2014-05-07 DIAGNOSIS — I429 Cardiomyopathy, unspecified: Secondary | ICD-10-CM

## 2014-05-07 DIAGNOSIS — I10 Essential (primary) hypertension: Secondary | ICD-10-CM | POA: Insufficient documentation

## 2014-05-07 DIAGNOSIS — I739 Peripheral vascular disease, unspecified: Secondary | ICD-10-CM | POA: Insufficient documentation

## 2014-05-07 DIAGNOSIS — I059 Rheumatic mitral valve disease, unspecified: Secondary | ICD-10-CM

## 2014-05-07 NOTE — Progress Notes (Signed)
Carotid Duplex Completed. °Brianna L Mazza,RVT °

## 2014-05-07 NOTE — Progress Notes (Signed)
2D Echo Performed 05/07/2014    Clearence Ped, RCS

## 2014-05-10 DIAGNOSIS — G629 Polyneuropathy, unspecified: Secondary | ICD-10-CM | POA: Insufficient documentation

## 2014-05-16 ENCOUNTER — Encounter (HOSPITAL_COMMUNITY): Payer: Medicare Other

## 2014-05-17 ENCOUNTER — Encounter (HOSPITAL_COMMUNITY): Payer: Medicare Other

## 2014-05-21 ENCOUNTER — Ambulatory Visit (HOSPITAL_COMMUNITY)
Admission: RE | Admit: 2014-05-21 | Discharge: 2014-05-21 | Disposition: A | Discharge status: Home / Self Care | Payer: Medicare Other | Source: Ambulatory Visit | Attending: Cardiovascular Disease | Admitting: Cardiovascular Disease

## 2014-05-21 ENCOUNTER — Ambulatory Visit (HOSPITAL_BASED_OUTPATIENT_CLINIC_OR_DEPARTMENT_OTHER)
Admission: RE | Admit: 2014-05-21 | Discharge: 2014-05-21 | Disposition: A | Discharge status: Home / Self Care | Payer: Medicare Other | Source: Ambulatory Visit | Attending: Cardiovascular Disease | Admitting: Cardiovascular Disease

## 2014-05-21 DIAGNOSIS — I739 Peripheral vascular disease, unspecified: Secondary | ICD-10-CM

## 2014-05-21 NOTE — Progress Notes (Signed)
Lower Extremity Arterial Duplex Completed. °Brianna L Mazza,RVT °

## 2014-05-21 NOTE — Progress Notes (Signed)
Renal Artery Duplex Completed. °Brianna L Mazza,RVT °

## 2014-05-30 ENCOUNTER — Ambulatory Visit (INDEPENDENT_AMBULATORY_CARE_PROVIDER_SITE_OTHER): Payer: Medicare Other | Admitting: *Deleted

## 2014-05-30 ENCOUNTER — Telehealth: Payer: Self-pay | Admitting: Cardiology

## 2014-05-30 ENCOUNTER — Encounter: Payer: Self-pay | Admitting: *Deleted

## 2014-05-30 DIAGNOSIS — Z95810 Presence of automatic (implantable) cardiac defibrillator: Secondary | ICD-10-CM

## 2014-05-30 DIAGNOSIS — I5042 Chronic combined systolic (congestive) and diastolic (congestive) heart failure: Secondary | ICD-10-CM

## 2014-05-30 NOTE — Telephone Encounter (Signed)
Spoke with pt and reminded pt of remote transmission that is due today. Pt verbalized understanding.   

## 2014-05-30 NOTE — Progress Notes (Signed)
EPIC Encounter for ICM Monitoring  Patient Name: Scott Parsons is a 81 y.o. male Date: 05/30/2014 Primary Care Physican: [name], MD Primary Cardiologist: Allyson Sabal Electrophysiologist: Allred Dry Weight: 194 lbs       In the past month, have you:  1. Gained more than 2 pounds in a day or more than 5 pounds in a week? No. His weight averages 194-196 lbs.   2. Had changes in your medications (with verification of current medications)? no  3. Had more shortness of breath than is usual for you? no  4. Limited your activity because of shortness of breath? no  5. Not been able to sleep because of shortness of breath? no  6. Had increased swelling in your feet or ankles? no  7. Had symptoms of dehydration (dizziness, dry mouth, increased thirst, decreased urine output) no  8. Had changes in sodium restriction? no  9. Been compliant with medication? Yes   ICM trend:   Follow-up plan: ICM clinic phone appointment: 07/01/14. The patient reports that he was initially taking lasix 40 mg daily and would take an extra 1/2 tablet as needed. He reports that for some time, he has been taking lasix 60 mg daily as he feels that his weight goes up on the 40 mg daily dose. I advised him to continue his current dose and that he should probably have a repeat BMP when he sees Dr. Allyson Sabal on 06/21/14. Last BMP was 09/24/13- BUN/ Creatinine- 24/1.7.   Copy of note sent to patient's primary care physician, primary cardiologist, and device following physician.  Sherri Rad, RN, BSN 05/30/2014 2:45 PM

## 2014-06-02 ENCOUNTER — Other Ambulatory Visit: Payer: Self-pay | Admitting: Cardiovascular Disease

## 2014-06-05 NOTE — Telephone Encounter (Signed)
Rx(s) sent to pharmacy electronically.  

## 2014-06-18 ENCOUNTER — Ambulatory Visit (INDEPENDENT_AMBULATORY_CARE_PROVIDER_SITE_OTHER): Payer: Medicare Other | Admitting: Cardiovascular Disease

## 2014-06-18 ENCOUNTER — Encounter: Payer: Self-pay | Admitting: Cardiovascular Disease

## 2014-06-18 VITALS — BP 110/58 | HR 68 | Ht 74.0 in | Wt 203.0 lb

## 2014-06-18 DIAGNOSIS — I428 Other cardiomyopathies: Secondary | ICD-10-CM

## 2014-06-18 DIAGNOSIS — I779 Disorder of arteries and arterioles, unspecified: Secondary | ICD-10-CM

## 2014-06-18 DIAGNOSIS — I429 Cardiomyopathy, unspecified: Secondary | ICD-10-CM

## 2014-06-18 DIAGNOSIS — I10 Essential (primary) hypertension: Secondary | ICD-10-CM

## 2014-06-18 DIAGNOSIS — I701 Atherosclerosis of renal artery: Secondary | ICD-10-CM

## 2014-06-18 DIAGNOSIS — E785 Hyperlipidemia, unspecified: Secondary | ICD-10-CM

## 2014-06-18 DIAGNOSIS — I739 Peripheral vascular disease, unspecified: Secondary | ICD-10-CM

## 2014-06-18 NOTE — Assessment & Plan Note (Signed)
Lower extremity arterial Doppler studies performed 05/21/14 revealed a right ABI 0.86 and a left 0.67. He has stable Fontan class 1-2 claudication. There are no identifiable high-grade lesions. We will continue to survey him on an annual basis.

## 2014-06-18 NOTE — Assessment & Plan Note (Signed)
History of hyperlipidemia on atorvastatin 20 mg a day followed by his PCP 

## 2014-06-18 NOTE — Progress Notes (Signed)
Mr. Zuba returns today for follow-up of his outpatient noninvasive studies. He had Dopplers of his carotids, renals and lower extremities as well as a 2-D echocardiogram. His vascular Doppler studies remained stable and his 2-D echo revealed an EF of 20-25%. He does have Fontan class 1-2 claudication which has remained stable as well. He denies chest pain or shortness of breath. I will have him see mid-level provider back in 6 months and me back in one year.  [name], M.D., FACP, [facility], [facility] [address] [place], [place]  [address]  [phone] 06/18/2014 9:43 AM

## 2014-06-18 NOTE — Assessment & Plan Note (Signed)
Status post CRT device implantation May of last year by Dr. Johney Frame. His recent 2-D echo performed 05/07/14 revealed an ejection fraction of 20-25%.

## 2014-06-18 NOTE — Patient Instructions (Signed)
We request that you follow-up in: 6 months with an extender and in 12 months with Dr Berry  You will receive a reminder letter in the mail two months in advance. If you don't receive a letter, please call our office to schedule the follow-up appointment.   

## 2014-06-18 NOTE — Assessment & Plan Note (Addendum)
History of hypertension blood pressure measured 110/58. He is on Atacand 8 milligrams a day, lisinopril 40 mg a day,metoprolol XL 200 mg a day, spironolactone. Continue current meds at current dosing

## 2014-06-18 NOTE — Assessment & Plan Note (Signed)
History of moderate carotid artery disease with recent duplex suggesting a diffuse 60% left internal carotid artery stenosis unchanged from his prior study. We will continue to follow this on an annual basis.

## 2014-06-18 NOTE — Assessment & Plan Note (Signed)
Stable by duplex ultrasound

## 2014-06-21 ENCOUNTER — Ambulatory Visit: Payer: Medicare Other | Admitting: Cardiovascular Disease

## 2014-07-01 ENCOUNTER — Telehealth: Payer: Self-pay | Admitting: Cardiology

## 2014-07-01 ENCOUNTER — Encounter: Payer: Medicare Other | Admitting: *Deleted

## 2014-07-01 NOTE — Telephone Encounter (Signed)
LMOVM reminding pt to send remote transmission.   

## 2014-07-02 ENCOUNTER — Encounter: Payer: Self-pay | Admitting: Cardiology

## 2014-07-04 ENCOUNTER — Ambulatory Visit (INDEPENDENT_AMBULATORY_CARE_PROVIDER_SITE_OTHER): Payer: Medicare Other | Admitting: *Deleted

## 2014-07-04 ENCOUNTER — Encounter: Payer: Self-pay | Admitting: *Deleted

## 2014-07-04 ENCOUNTER — Telehealth: Payer: Self-pay | Admitting: Internal Medicine

## 2014-07-04 DIAGNOSIS — I5042 Chronic combined systolic (congestive) and diastolic (congestive) heart failure: Secondary | ICD-10-CM

## 2014-07-04 DIAGNOSIS — Z95810 Presence of automatic (implantable) cardiac defibrillator: Secondary | ICD-10-CM

## 2014-07-04 DIAGNOSIS — I428 Other cardiomyopathies: Secondary | ICD-10-CM

## 2014-07-04 LAB — MDC_IDC_ENUM_SESS_TYPE_REMOTE
Battery Remaining Longevity: 100 mo
Battery Voltage: 3.01 V
Brady Statistic AP VS Percent: 0.94 %
Brady Statistic RA Percent Paced: 61.82 %
Brady Statistic RV Percent Paced: 26.23 %
HIGH POWER IMPEDANCE MEASURED VALUE: 69 Ohm
Lead Channel Impedance Value: 361 Ohm
Lead Channel Impedance Value: 399 Ohm
Lead Channel Impedance Value: 437 Ohm
Lead Channel Impedance Value: 494 Ohm
Lead Channel Impedance Value: 551 Ohm
Lead Channel Impedance Value: 589 Ohm
Lead Channel Impedance Value: 589 Ohm
Lead Channel Impedance Value: 684 Ohm
Lead Channel Impedance Value: 684 Ohm
Lead Channel Pacing Threshold Amplitude: 0.625 V
Lead Channel Pacing Threshold Amplitude: 0.875 V
Lead Channel Pacing Threshold Pulse Width: 0.4 ms
Lead Channel Pacing Threshold Pulse Width: 0.4 ms
Lead Channel Sensing Intrinsic Amplitude: 2.125 mV
Lead Channel Sensing Intrinsic Amplitude: 24.625 mV
Lead Channel Sensing Intrinsic Amplitude: 24.625 mV
Lead Channel Setting Pacing Amplitude: 2 V
Lead Channel Setting Pacing Pulse Width: 0.4 ms
MDC IDC MSMT LEADCHNL LV IMPEDANCE VALUE: 342 Ohm
MDC IDC MSMT LEADCHNL LV IMPEDANCE VALUE: 399 Ohm
MDC IDC MSMT LEADCHNL LV IMPEDANCE VALUE: 437 Ohm
MDC IDC MSMT LEADCHNL LV IMPEDANCE VALUE: 703 Ohm
MDC IDC MSMT LEADCHNL RA SENSING INTR AMPL: 2.125 mV
MDC IDC MSMT LEADCHNL RV PACING THRESHOLD AMPLITUDE: 0.625 V
MDC IDC MSMT LEADCHNL RV PACING THRESHOLD PULSEWIDTH: 0.4 ms
MDC IDC SESS DTM: 20160225181924
MDC IDC SET LEADCHNL LV PACING AMPLITUDE: 2 V
MDC IDC SET LEADCHNL LV PACING PULSEWIDTH: 0.4 ms
MDC IDC SET LEADCHNL RV PACING AMPLITUDE: 2.5 V
MDC IDC SET LEADCHNL RV SENSING SENSITIVITY: 0.3 mV
MDC IDC SET ZONE DETECTION INTERVAL: 300 ms
MDC IDC SET ZONE DETECTION INTERVAL: 350 ms
MDC IDC SET ZONE DETECTION INTERVAL: 350 ms
MDC IDC STAT BRADY AP VP PERCENT: 60.88 %
MDC IDC STAT BRADY AS VP PERCENT: 37.42 %
MDC IDC STAT BRADY AS VS PERCENT: 0.76 %
Zone Setting Detection Interval: 360 ms

## 2014-07-04 NOTE — Progress Notes (Signed)
Remote ICD transmission.   

## 2014-07-04 NOTE — Telephone Encounter (Signed)
Informed pt that transmission was not received. Asked him to send another manual transmission. Pt verbalized understanding.

## 2014-07-04 NOTE — Progress Notes (Signed)
EPIC Encounter for ICM Monitoring  Patient Name: Scott Parsons is a 81 y.o. male Date: 07/04/2014 Primary Care Physican: [name], MD Primary Cardiologist: Allyson Sabal Electrophysiologist: Allred Dry Weight: 197 lbs       In the past month, have you:  1. Gained more than 2 pounds in a day or more than 5 pounds in a week? No. He has had a gradual increase in his weight over the last few months from 195 lbs to 197 lbs.   2. Had changes in your medications (with verification of current medications)? no  3. Had more shortness of breath than is usual for you? no  4. Limited your activity because of shortness of breath? no  5. Not been able to sleep because of shortness of breath? no  6. Had increased swelling in your feet or ankles? no  7. Had symptoms of dehydration (dizziness, dry mouth, increased thirst, decreased urine output) no  8. Had changes in sodium restriction? no  9. Been compliant with medication? Yes   ICM trend:   Follow-up plan: ICM clinic phone appointment: 08/05/14  Copy of note sent to patient's primary care physician, primary cardiologist, and device following physician.  Sherri Rad, RN, BSN 07/04/2014 12:56 PM

## 2014-07-04 NOTE — Telephone Encounter (Signed)
New message      Did we get his remote check from Monday?  Pt want the results

## 2014-07-08 ENCOUNTER — Encounter: Payer: Self-pay | Admitting: Cardiology

## 2014-07-15 ENCOUNTER — Encounter: Payer: Self-pay | Admitting: Internal Medicine

## 2014-08-05 ENCOUNTER — Telehealth: Payer: Self-pay | Admitting: *Deleted

## 2014-08-05 ENCOUNTER — Ambulatory Visit (INDEPENDENT_AMBULATORY_CARE_PROVIDER_SITE_OTHER): Payer: Medicare Other | Admitting: *Deleted

## 2014-08-05 DIAGNOSIS — Z95810 Presence of automatic (implantable) cardiac defibrillator: Secondary | ICD-10-CM | POA: Diagnosis not present

## 2014-08-05 DIAGNOSIS — I5042 Chronic combined systolic (congestive) and diastolic (congestive) heart failure: Secondary | ICD-10-CM

## 2014-08-05 NOTE — Telephone Encounter (Signed)
New Message    Patient is calling to see if his remote check was sent.  Please give patient a call.

## 2014-08-05 NOTE — Telephone Encounter (Signed)
Informed pt that transmission was received. Pt verbalized understanding.  

## 2014-08-07 ENCOUNTER — Encounter: Payer: Self-pay | Admitting: *Deleted

## 2014-08-07 NOTE — Progress Notes (Signed)
EPIC Encounter for ICM Monitoring  Patient Name: Scott Parsons is a 81 y.o. male Date: 08/07/2014 Primary Care Physican: [name], MD Primary Cardiologist: Allyson Sabal Electrophysiologist: Allred Dry Weight: 197 lbs       In the past month, have you:  1. Gained more than 2 pounds in a day or more than 5 pounds in a week? no  2. Had changes in your medications (with verification of current medications)? No. The patient reports his insurance has sent him a letter that they will no longer be covering his candesartan. He has spoken with his PCP office and they are going to be in contact with his insurance company.   3. Had more shortness of breath than is usual for you? no  4. Limited your activity because of shortness of breath? no  5. Not been able to sleep because of shortness of breath? no  6. Had increased swelling in your feet or ankles? no  7. Had symptoms of dehydration (dizziness, dry mouth, increased thirst, decreased urine output) no  8. Had changes in sodium restriction? no  9. Been compliant with medication? Yes   ICM trend:   Follow-up plan: ICM clinic phone appointment: 10/28/14. The patient is doing well today per his report. He is due to follow up on 09/26/14 with Dr. Johney Frame. I will follow up with him after that.   Copy of note sent to patient's primary care physician, primary cardiologist, and device following physician.  Sherri Rad, RN, BSN 08/07/2014 1:15 PM

## 2014-09-16 ENCOUNTER — Other Ambulatory Visit: Payer: Self-pay | Admitting: Cardiovascular Disease

## 2014-09-25 ENCOUNTER — Other Ambulatory Visit: Payer: Self-pay

## 2014-09-26 ENCOUNTER — Encounter: Payer: Self-pay | Admitting: Internal Medicine

## 2014-09-26 ENCOUNTER — Ambulatory Visit (INDEPENDENT_AMBULATORY_CARE_PROVIDER_SITE_OTHER): Payer: Medicare Other | Admitting: Internal Medicine

## 2014-09-26 VITALS — BP 112/60 | HR 62 | Ht 74.0 in | Wt 199.4 lb

## 2014-09-26 DIAGNOSIS — I5022 Chronic systolic (congestive) heart failure: Secondary | ICD-10-CM

## 2014-09-26 DIAGNOSIS — I429 Cardiomyopathy, unspecified: Secondary | ICD-10-CM | POA: Diagnosis not present

## 2014-09-26 DIAGNOSIS — I428 Other cardiomyopathies: Secondary | ICD-10-CM

## 2014-09-26 LAB — CUP PACEART INCLINIC DEVICE CHECK
Battery Voltage: 3.01 V
Brady Statistic AP VS Percent: 1.15 %
Brady Statistic AS VP Percent: 31.63 %
Brady Statistic AS VS Percent: 0.9 %
Brady Statistic RV Percent Paced: 33.53 %
Date Time Interrogation Session: 20160519113450
HIGH POWER IMPEDANCE MEASURED VALUE: 209 Ohm
HighPow Impedance: 74 Ohm
Lead Channel Pacing Threshold Amplitude: 0.75 V
Lead Channel Pacing Threshold Amplitude: 0.75 V
Lead Channel Pacing Threshold Amplitude: 1 V
Lead Channel Pacing Threshold Pulse Width: 0.4 ms
Lead Channel Pacing Threshold Pulse Width: 0.4 ms
Lead Channel Sensing Intrinsic Amplitude: 26.375 mV
Lead Channel Setting Pacing Amplitude: 2 V
Lead Channel Setting Pacing Amplitude: 2 V
Lead Channel Setting Pacing Amplitude: 2.5 V
Lead Channel Setting Pacing Pulse Width: 0.4 ms
Lead Channel Setting Sensing Sensitivity: 0.3 mV
MDC IDC MSMT BATTERY REMAINING LONGEVITY: 97 mo
MDC IDC MSMT LEADCHNL LV PACING THRESHOLD PULSEWIDTH: 0.4 ms
MDC IDC MSMT LEADCHNL RA IMPEDANCE VALUE: 551 Ohm
MDC IDC MSMT LEADCHNL RA SENSING INTR AMPL: 4.125 mV
MDC IDC MSMT LEADCHNL RV IMPEDANCE VALUE: 494 Ohm
MDC IDC SET LEADCHNL RV PACING PULSEWIDTH: 0.4 ms
MDC IDC SET ZONE DETECTION INTERVAL: 300 ms
MDC IDC STAT BRADY AP VP PERCENT: 66.32 %
MDC IDC STAT BRADY RA PERCENT PACED: 67.47 %
Zone Setting Detection Interval: 350 ms
Zone Setting Detection Interval: 350 ms
Zone Setting Detection Interval: 360 ms

## 2014-09-26 NOTE — Progress Notes (Signed)
PCP: [name], MD Primary Cardiologist:  Dr Bettina Gavia is a 81 y.o. male who presents today for routine electrophysiology followup.  Since his last visit, the patient reports doing very well.  He has stable SOB.  He also has claudication. Today, he denies symptoms of palpitations, chest pain,  lower extremity edema, dizziness, presyncope, syncope, or ICD shocks.  The patient is otherwise without complaint today.   Past Medical History  Diagnosis Date  . CAD (coronary artery disease)   . Hypertension   . Hyperlipidemia   . History of stress test 12/08/2009    revealed inferior scar.  [name] Hx of Doppler ultrasound     reveal stable internal carotid artery stenosis left greater than right, as well as patent iliac stents with probably mild to moderate in-stent restenosis with ABIs of 0.89 on the right and 0.79 on the left.  [name] Hx of echocardiogram 01/18/2005    EF 35-45% with mild MR and TR and his last functional study performed 8/05 revealed inferior apical and lateral scar with LV dilation.  . OSA (obstructive sleep apnea)     On C-pap  . PVD (peripheral vascular disease)     with right external iliac artery angioplasty and left renal artery angioplasty and stenting.  . Renal artery stenosis   . History of renal stent   . History of stroke   . Carotid artery disease   . Nonischemic cardiomyopathy   . Chronic systolic dysfunction of left ventricle     EF 25% by echo 9/14  . LBBB (left bundle branch block)   . CHF (congestive heart failure)   . TIA (transient ischemic attack) 06/2010  . Pre-diabetes   . ICD (implantable cardioverter-defibrillator), dual, in situ    Past Surgical History  Procedure Laterality Date  . Cardiac catheterization  2003    Mild LV dysfuntion. he had apical and inferoapical wall motion abnormalities and EF of 45%.  . Cardiac catheterization  10/18/2005    STENTS: left renal artery as well as both iliac arteries. Was stented with 12mm x 4mm  Smart Nitinol self-expanding stent replaced with a 9mm x 4mm balloon at nominal pressures, resulting in a reduction od 50% proximal right external iliac artery  stenosis to 0% residual.  . Hand surgery  01/1995  . Hernia repair    . Bi-ventricular implantable cardioverter defibrillator  (crt-d)  09-13-2013    MDT CRTD implanted by Dr Johney Frame  . Bi-ventricular implantable cardioverter defibrillator N/A 09/13/2013    Procedure: BI-VENTRICULAR IMPLANTABLE CARDIOVERTER DEFIBRILLATOR  (CRT-D);  Surgeon: Gardiner Rhyme, MD;  Location: [facility] CATH LAB;  Service: Cardiovascular;  Laterality: N/A;    ROS- all systems are reviewed and negative except as per HPI above  Current Outpatient Prescriptions  Medication Sig Dispense Refill  . allopurinol (ZYLOPRIM) 100 MG tablet Take 100 mg by mouth daily.    [name] aspirin EC 81 MG tablet Take 81 mg by mouth daily.    [name] atorvastatin (LIPITOR) 20 MG tablet Take 20 mg by mouth daily.    . clopidogrel (PLAVIX) 75 MG tablet Take 75 mg by mouth daily.    . furosemide (LASIX) 40 MG tablet Take 1.5 tablets (60 mg total) by mouth daily. 45 tablet 11  . furosemide (LASIX) 40 MG tablet TAKE 1 TABLET BY MOUTH DAILY. 30 tablet 10  . isosorbide mononitrate (IMDUR) 30 MG 24 hr tablet Take 30 mg by mouth daily.    [name] lisinopril (PRINIVIL,ZESTRIL) 40 MG tablet  Take 0.5 tablets (20 mg total) by mouth daily. 30 tablet 11  . Magnesium 500 MG CAPS Take 500 mg by mouth daily.     . metoprolol (TOPROL-XL) 200 MG 24 hr tablet Take 0.5 tablets by mouth daily. Takes along with  Toprol XL 100 mg tablet (1/2)  11  . metoprolol succinate (TOPROL-XL) 100 MG 24 hr tablet Take 50 mg by mouth daily. Takes with 100mg  to equal 150mg  daily    . niacin 500 MG tablet Take 500 mg by mouth daily with breakfast.    . NITROSTAT 0.4 MG SL tablet Place 0.4 mg under the tongue every 5 (five) minutes as needed for chest pain (MAX 3 TABLETS).     . NON FORMULARY Wears O2 at night (2 L)    . NON FORMULARY Use as  directed - Neoteric diabetic skincare    . spironolactone (ALDACTONE) 25 MG tablet Take 12.5 mg by mouth daily.    . valsartan (DIOVAN) 80 MG tablet Take 80 mg by mouth daily.    [name] zolpidem (AMBIEN) 10 MG tablet Take 5 mg by mouth at bedtime.      No current facility-administered medications for this visit.    Physical Exam: Filed Vitals:   09/26/14 1102  BP: 112/60  Pulse: 62  Height: 6\' 2"  (1.88 m)  Weight: 199 lb 6.4 oz (90.447 kg)    GEN- The patient is well appearing, alert and oriented x 3 today.   Head- normocephalic, atraumatic Eyes-  Sclera clear, conjunctiva pink Ears- hearing intact Oropharynx- clear Lungs- Clear to ausculation bilaterally, normal work of breathing Chest- ICD pocket is well healed Heart- Regular rate and rhythm, no murmurs, rubs or gallops, PMI not laterally displaced GI- soft, NT, ND, + BS Extremities- no clubbing, cyanosis, or edema  ICD interrogation- reviewed in detail today,  See PACEART report  Assessment and Plan:  1.  Chronic systolic dysfunction euvolemic today Stable on an appropriate medical regimen Normal ICD function See Pace Art report No changes today  Sherri Rad is following in [facility] clinic carelink Follow-up with Dr Allyson Sabal as scheduled EP NP to see in a year

## 2014-09-26 NOTE — Patient Instructions (Signed)
Medication Instructions:  Your physician recommends that you continue on your current medications as directed. Please refer to the Current Medication list given to you today.   Labwork: None ordered  Testing/Procedures: None ordered  Follow-Up: Your physician wants you to follow-up in: 12 months with Scott Balsam, NP You will receive a reminder letter in the mail two months in advance. If you don't receive a letter, please call our office to schedule the follow-up appointment.  Remote monitoring is used to monitor your Pacemaker or ICD from home. This monitoring reduces the number of office visits required to check your device to one time per year. It allows [place] to keep an eye on the functioning of your device to ensure it is working properly. You are scheduled for a device check from home on 12/26/14. You may send your transmission at any time that day. If you have a wireless device, the transmission will be sent automatically. After your physician reviews your transmission, you will receive a postcard with your next transmission date.     Any Other Special Instructions Will Be Listed Below (If Applicable).

## 2014-10-28 ENCOUNTER — Encounter: Payer: Self-pay | Admitting: *Deleted

## 2014-10-28 ENCOUNTER — Ambulatory Visit (INDEPENDENT_AMBULATORY_CARE_PROVIDER_SITE_OTHER): Payer: Medicare Other | Admitting: *Deleted

## 2014-10-28 DIAGNOSIS — Z95810 Presence of automatic (implantable) cardiac defibrillator: Secondary | ICD-10-CM

## 2014-10-28 DIAGNOSIS — I429 Cardiomyopathy, unspecified: Secondary | ICD-10-CM

## 2014-10-28 DIAGNOSIS — I5042 Chronic combined systolic (congestive) and diastolic (congestive) heart failure: Secondary | ICD-10-CM

## 2014-10-28 DIAGNOSIS — I428 Other cardiomyopathies: Secondary | ICD-10-CM

## 2014-10-28 NOTE — Progress Notes (Signed)
.  EPIC Encounter for ICM Monitoring  Patient Name: Scott Parsons is a 81 y.o. male Date: 10/28/2014 Primary Care Physican: [name], MD Primary Cardiologist: Allyson Sabal Electrophysiologist: Allred Dry Weight: 194 lbs       In the past month, have you:  1. Gained more than 2 pounds in a day or more than 5 pounds in a week? no  2. Had changes in your medications (with verification of current medications)? no  3. Had more shortness of breath than is usual for you? no  4. Limited your activity because of shortness of breath? no  5. Not been able to sleep because of shortness of breath? no  6. Had increased swelling in your feet or ankles? no  7. Had symptoms of dehydration (dizziness, dry mouth, increased thirst, decreased urine output) no  8. Had changes in sodium restriction? no  9. Been compliant with medication? Yes   ICM trend:   Follow-up plan: ICM clinic phone appointment: 11/28/14. No changes today. The patient's only complaint today is some worsening bilateral hip pain (know PAD) that he has with walking. He will take tylenol for pain- some relief. No follow up due with Dr. Allyson Sabal- no current lower extremity vascular studies ordered. Will forward to Dr. Allyson Sabal for review. I have advised the patient if this becomes more painful for him, to call Dr. Hazle Coca office for follow up. He is agreeable.  Copy of note sent to patient's primary care physician, primary cardiologist, and device following physician.  Sherri Rad, RN, BSN 10/28/2014 12:10 PM

## 2014-11-02 ENCOUNTER — Other Ambulatory Visit: Payer: Self-pay | Admitting: Cardiovascular Disease

## 2014-11-04 NOTE — Telephone Encounter (Signed)
Rx(s) sent to pharmacy electronically.  

## 2014-11-21 DIAGNOSIS — I700 Atherosclerosis of aorta: Secondary | ICD-10-CM | POA: Insufficient documentation

## 2014-11-22 ENCOUNTER — Other Ambulatory Visit: Payer: Self-pay | Admitting: Cardiovascular Disease

## 2014-11-22 ENCOUNTER — Telehealth (HOSPITAL_COMMUNITY): Payer: Self-pay | Admitting: *Deleted

## 2014-11-22 DIAGNOSIS — I739 Peripheral vascular disease, unspecified: Secondary | ICD-10-CM

## 2014-11-26 ENCOUNTER — Other Ambulatory Visit: Payer: Self-pay | Admitting: Cardiovascular Disease

## 2014-11-26 ENCOUNTER — Encounter: Payer: Self-pay | Admitting: Cardiovascular Disease

## 2014-11-26 ENCOUNTER — Ambulatory Visit (INDEPENDENT_AMBULATORY_CARE_PROVIDER_SITE_OTHER): Payer: Medicare Other | Admitting: Cardiovascular Disease

## 2014-11-26 VITALS — BP 122/56 | Ht 74.0 in | Wt 194.0 lb

## 2014-11-26 DIAGNOSIS — I739 Peripheral vascular disease, unspecified: Secondary | ICD-10-CM

## 2014-11-26 DIAGNOSIS — I10 Essential (primary) hypertension: Secondary | ICD-10-CM | POA: Diagnosis not present

## 2014-11-26 DIAGNOSIS — E785 Hyperlipidemia, unspecified: Secondary | ICD-10-CM | POA: Diagnosis not present

## 2014-11-26 DIAGNOSIS — I5042 Chronic combined systolic (congestive) and diastolic (congestive) heart failure: Secondary | ICD-10-CM

## 2014-11-26 DIAGNOSIS — I779 Disorder of arteries and arterioles, unspecified: Secondary | ICD-10-CM | POA: Diagnosis not present

## 2014-11-26 NOTE — Patient Instructions (Addendum)
Your physician wants you to follow-up in: 1 year with Dr Allyson Sabal. You will receive a reminder letter in the mail two months in advance. If you don't receive a letter, please call our office to schedule the follow-up appointment.   We have made arrangements for your dopplers (carotid, renal, and lower extremity) to be done in January 2017.

## 2014-11-26 NOTE — Assessment & Plan Note (Signed)
History of carotid artery disease with last Dopplers performed 05/07/14 revealing moderate left ICA stenosis. We will continue to follow this by duplex ultrasound

## 2014-11-26 NOTE — Assessment & Plan Note (Signed)
History of hyperlipidemia on atorvastatin 20 mg a day followed by his PCP 

## 2014-11-26 NOTE — Progress Notes (Signed)
11/26/2014 Scott Parsons   03-08-1930  [id number]  Primary Physician [name], MD Primary Cardiologist: [name] MD Scott Parsons   HPI:  The patient is a very pleasant 81 year old, mildly overweight, married Caucasian male, father of 3, grandfather to 7 grandchildren who I saw in the office 6 months ago. He has a history of noncritical CAD by cath which I performed in 2003 with mild LV dysfunction. At that time, he had apical and inferoapical wall motion abnormalities and EF of 45%. He has PVOD with bilateral carotid disease left greater than right which we follow by duplex ultrasound. He is neurologically asymptomatic. I stented his left renal artery October 18, 2005, as well as both iliac arteries. He does have moderate SFA disease bilaterally. He does complain of some hip pain when walking on a treadmill or walking up an incline, but rides a bike without limitation. His other problems include hypertension and hyperlipidemia. His last Myoview performed December 08, 2009, revealed inferior scar. Dr. [name] follows his lipid profile. His most recent Dopplers reveal stable internal carotid artery stenosis left greater than right, as well as patent iliac stents with probably mild to moderate in-stent restenosis with ABIs of 0.89 on the right and 0.79 on the left.  Saw him last a/2/13 he has had admissions for congestive heart failure at [facility] in March of this year probably related to dietary indiscretion. He spent today's in the hospital I was direst. He had recent renal Doppler studies that showed progression of disease on left side suggesting "in-stent restenosis. He denies chest pain or shortness of breath now. Does have lifestyle limiting claudication. His last lower extremity arterial Doppler studies were performed 01/25/13. He also has moderate asymmetric left internal carotid artery stenosis by 2+ ultrasound performed 11/08/11. Because his echocardiogram performed  09/06/13 revealed  an ejection fraction of 15 and 20% he underwent biventricular ICD implantation by Dr. Hillis Range 09/13/13 with a CRT device.he clinically improved after that although his ejection fraction by 2-D echo in December was 20-25%.   Current Outpatient Prescriptions  Medication Sig Dispense Refill  . allopurinol (ZYLOPRIM) 100 MG tablet Take 100 mg by mouth daily.    [name] aspirin EC 81 MG tablet Take 81 mg by mouth daily.    [name] atorvastatin (LIPITOR) 20 MG tablet Take 20 mg by mouth daily.    . candesartan (ATACAND) 8 MG tablet Take 8 mg by mouth daily.  3  . clopidogrel (PLAVIX) 75 MG tablet Take 75 mg by mouth daily.    . furosemide (LASIX) 40 MG tablet Take 1 tablet (40 mg total) by mouth daily. 30 tablet 9  . isosorbide mononitrate (IMDUR) 30 MG 24 hr tablet Take 30 mg by mouth daily.    [name] lisinopril (PRINIVIL,ZESTRIL) 40 MG tablet Take 0.5 tablets (20 mg total) by mouth daily. 30 tablet 11  . Magnesium 500 MG CAPS Take 500 mg by mouth daily.     . metoprolol (TOPROL-XL) 200 MG 24 hr tablet Take 0.5 tablets by mouth daily. Takes along with  Toprol XL 100 mg tablet (1/2)  11  . metoprolol succinate (TOPROL-XL) 100 MG 24 hr tablet Take 50 mg by mouth daily. Takes with 100mg  to equal 150mg  daily    . metoprolol succinate (TOPROL-XL) 100 MG 24 hr tablet Take 1 tablet (100 mg total) by mouth daily. Take with 50mg  tablet to equal 150mg . 30 tablet 7  . niacin 500 MG tablet Take 500 mg by  mouth daily with breakfast.    . NITROSTAT 0.4 MG SL tablet Place 0.4 mg under the tongue every 5 (five) minutes as needed for chest pain (MAX 3 TABLETS).     . NON FORMULARY Wears O2 at night (2 L)    . NON FORMULARY Use as directed - [employer] diabetic skincare    . PROAIR HFA 108 (90 BASE) MCG/ACT inhaler Inhale 2 puffs into the lungs every 12 (twelve) hours as needed for wheezing.   3  . spironolactone (ALDACTONE) 25 MG tablet Take 12.5 mg by mouth daily.    [name] zolpidem (AMBIEN) 10 MG tablet Take 5 mg by  mouth at bedtime.      No current facility-administered medications for this visit.    Allergies  Allergen Reactions  . Amoxicillin Itching    Itching    . Biaxin [Clarithromycin] Itching    Lip swelling     . Erythromycin Itching  . Penicillins Itching  . Shellfish Allergy Itching          History   Social History  . Marital Status: Married    Spouse Name: N/A  . Number of Children: N/A  . Years of Education: N/A   Occupational History  . Not on file.   Social History Main Topics  . Smoking status: Former Smoker -- 52 years    Types: Cigarettes    Quit date: 08/11/2004  . Smokeless tobacco: Never Used  . Alcohol Use: No  . Drug Use: No  . Sexual Activity: Not on file   Other Topics Concern  . Not on file   Social History Narrative   Retired     Review of Systems: General: negative for chills, fever, night sweats or weight changes.  Cardiovascular: negative for chest pain, dyspnea on exertion, edema, orthopnea, palpitations, paroxysmal nocturnal dyspnea or shortness of breath Dermatological: negative for rash Respiratory: negative for cough or wheezing Urologic: negative for hematuria Abdominal: negative for nausea, vomiting, diarrhea, bright red blood per rectum, melena, or hematemesis Neurologic: negative for visual changes, syncope, or dizziness All other systems reviewed and are otherwise negative except as noted above.    Blood pressure 122/56, height 6\' 2"  (1.88 m), weight 194 lb (87.998 kg).  General appearance: alert and no distress Neck: no adenopathy, no JVD, supple, symmetrical, trachea midline, thyroid not enlarged, symmetric, no tenderness/mass/nodules and soft bilateral carotid bruits Lungs: clear to auscultation bilaterally Heart: regular rate and rhythm, S1, S2 normal, no murmur, click, rub or gallop Extremities: extremities normal, atraumatic, no cyanosis or edema  EKG AV dual paced rhythm. I personally reviewed this  EKG  ASSESSMENT AND PLAN:   Renal artery stenosis History of renal artery stenosis which we followed by duplex ultrasound. I stented his left renal artery 10/18/05. His last renal Doppler study performed 05/21/14. Suggest moderate in-stent restenosis within the left renal artery stent.  Peripheral arterial disease History of peripheral arterial disease status post bilateral iliac stenting by myself remotely which we have been following a complex ultrasound. He does complain of bilateral hip pain with ambulation although his lower extremity Doppler studies suggest that his stents have remained patent.  Hyperlipidemia History of hyper-lipidemia on atorvastatin 20 mg a day followed by his PCP  Essential hypertension History of hypertension with blood pressure measured today 122/56. He is on metoprolol and Atacand. Continue current meds at current dosing  Carotid artery disease History of carotid artery disease with last Dopplers performed 05/07/14 revealing moderate left ICA stenosis. We will  continue to follow this by duplex ultrasound  Chronic combined systolic and diastolic congestive heart failure, NYHA class 2 History of nonischemic cardiopathy and EF in the 15-20% range status post Bi-V  ICD implantation by Dr. Hillis Range 09/13/13 with a CRT device. His follow-up 2-D echocardiogram performed in December revealed an EF of 20-25%. He does get mild dyspnea on exertion when walking up an incline.      [name] MD FACP,FACC,FAHA, [facility] 11/26/2014 4:23 PM

## 2014-11-26 NOTE — Assessment & Plan Note (Signed)
History of nonischemic cardiopathy and EF in the 15-20% range status post Bi-V  ICD implantation by Dr. Hillis Range 09/13/13 with a CRT device. His follow-up 2-D echocardiogram performed in December revealed an EF of 20-25%. He does get mild dyspnea on exertion when walking up an incline.

## 2014-11-26 NOTE — Assessment & Plan Note (Signed)
History of renal artery stenosis which we followed by duplex ultrasound. I stented his left renal artery 10/18/05. His last renal Doppler study performed 05/21/14. Suggest moderate in-stent restenosis within the left renal artery stent.

## 2014-11-26 NOTE — Telephone Encounter (Signed)
Rx(s) sent to pharmacy electronically.  

## 2014-11-26 NOTE — Assessment & Plan Note (Signed)
History of peripheral arterial disease status post bilateral iliac stenting by myself remotely which we have been following a complex ultrasound. He does complain of bilateral hip pain with ambulation although his lower extremity Doppler studies suggest that his stents have remained patent.

## 2014-11-26 NOTE — Assessment & Plan Note (Addendum)
History of hypertension with blood pressure measured today 122/56. He is on metoprolol and Atacand. Continue current meds at current dosing

## 2014-11-27 ENCOUNTER — Other Ambulatory Visit: Payer: Self-pay | Admitting: Cardiovascular Disease

## 2014-11-27 ENCOUNTER — Encounter: Payer: Self-pay | Admitting: Cardiovascular Disease

## 2014-11-28 ENCOUNTER — Ambulatory Visit (INDEPENDENT_AMBULATORY_CARE_PROVIDER_SITE_OTHER): Payer: Medicare Other | Admitting: *Deleted

## 2014-11-28 DIAGNOSIS — Z95810 Presence of automatic (implantable) cardiac defibrillator: Secondary | ICD-10-CM | POA: Diagnosis not present

## 2014-11-28 DIAGNOSIS — I5022 Chronic systolic (congestive) heart failure: Secondary | ICD-10-CM

## 2014-11-29 ENCOUNTER — Encounter: Payer: Self-pay | Admitting: *Deleted

## 2014-11-29 NOTE — Progress Notes (Signed)
EPIC Encounter for ICM Monitoring  Patient Name: Scott Parsons is a 81 y.o. male Date: 11/29/2014 Primary Care Physican: [name], MD Primary Cardiologist: Allyson Sabal Electrophysiologist: Allred Dry Weight: 190 lbs       In the past month, have you:  1. Gained more than 2 pounds in a day or more than 5 pounds in a week? No. He reports that his baseline weight is down from 194 lbs to 190 lbs.   2. Had changes in your medications (with verification of current medications)? no  3. Had more shortness of breath than is usual for you? no  4. Limited your activity because of shortness of breath? no  5. Not been able to sleep because of shortness of breath? no  6. Had increased swelling in your feet or ankles? no  7. Had symptoms of dehydration (dizziness, dry mouth, increased thirst, decreased urine output) no  8. Had changes in sodium restriction? no  9. Been compliant with medication? Yes   ICM trend:   Follow-up plan: ICM clinic phone appointment: 01/09/15. The patient is doing well today. No changes made.  Copy of note sent to patient's primary care physician, primary cardiologist, and device following physician.  Sherri Rad, RN, BSN 11/29/2014 1:45 PM

## 2014-12-11 ENCOUNTER — Encounter (HOSPITAL_COMMUNITY): Payer: Medicare Other

## 2014-12-17 ENCOUNTER — Ambulatory Visit: Payer: Medicare Other | Admitting: Physician Assistant

## 2014-12-30 ENCOUNTER — Telehealth: Payer: Self-pay

## 2014-12-30 NOTE — Telephone Encounter (Signed)
New message    Pt sent transmission on 8-18 @ 6:10pm   Pt wanting to know if we received transmission Please call to discuss

## 2014-12-31 ENCOUNTER — Ambulatory Visit (INDEPENDENT_AMBULATORY_CARE_PROVIDER_SITE_OTHER): Payer: Medicare Other

## 2014-12-31 DIAGNOSIS — Z0389 Encounter for observation for other suspected diseases and conditions ruled out: Secondary | ICD-10-CM

## 2014-12-31 DIAGNOSIS — I5022 Chronic systolic (congestive) heart failure: Secondary | ICD-10-CM | POA: Diagnosis not present

## 2014-12-31 DIAGNOSIS — Z4502 Encounter for adjustment and management of automatic implantable cardiac defibrillator: Secondary | ICD-10-CM

## 2014-12-31 NOTE — Telephone Encounter (Signed)
Spoke with patient.

## 2014-12-31 NOTE — Progress Notes (Signed)
EPIC Encounter for ICM Monitoring  Patient Name: Scott Parsons is a 81 y.o. male Date: 12/31/2014 Primary Care Physican: [name], MD Primary Cardiologist: Allyson Sabal Electrophysiologist: Allred Dry Weight: 185.5 to 187lbs range        In the past month, have you:  1. Gained more than 2 pounds in a day or more than 5 pounds in a week? no  2. Had changes in your medications (with verification of current medications)? Yes, Gabapentin 300 tablets - 1 to 3 tablets at hs for sleep  3. Had more shortness of breath than is usual for you? no  4. Limited your activity because of shortness of breath? no  5. Not been able to sleep because of shortness of breath? no  6. Had increased swelling in your feet or ankles? no  7. Had symptoms of dehydration (dizziness, dry mouth, increased thirst, decreased urine output) no  8. Had changes in sodium restriction? no  9. Been compliant with medication? Yes   ICM trend:   Follow-up plan: ICM clinic phone appointment 01/30/2015.  Patient stated he has had shoulder pain due to arthritis and was prescribed Gabapentin for sleep which has been effective.  No changes today.  Copy of note sent to patient's primary care physician, primary cardiologist, and device following physician.  Karie Soda, RN, CCM 12/31/2014 9:55 AM

## 2015-01-15 ENCOUNTER — Ambulatory Visit (HOSPITAL_COMMUNITY)
Admission: RE | Admit: 2015-01-15 | Discharge: 2015-01-15 | Disposition: A | Discharge status: Home / Self Care | Payer: Medicare Other | Source: Ambulatory Visit | Attending: Cardiovascular Disease | Admitting: Cardiovascular Disease

## 2015-01-15 DIAGNOSIS — Z87891 Personal history of nicotine dependence: Secondary | ICD-10-CM | POA: Diagnosis not present

## 2015-01-15 DIAGNOSIS — E785 Hyperlipidemia, unspecified: Secondary | ICD-10-CM | POA: Insufficient documentation

## 2015-01-15 DIAGNOSIS — I701 Atherosclerosis of renal artery: Secondary | ICD-10-CM | POA: Insufficient documentation

## 2015-01-15 DIAGNOSIS — I10 Essential (primary) hypertension: Secondary | ICD-10-CM | POA: Insufficient documentation

## 2015-01-15 DIAGNOSIS — I739 Peripheral vascular disease, unspecified: Secondary | ICD-10-CM

## 2015-01-15 DIAGNOSIS — I2510 Atherosclerotic heart disease of native coronary artery without angina pectoris: Secondary | ICD-10-CM | POA: Insufficient documentation

## 2015-01-20 ENCOUNTER — Telehealth: Payer: Self-pay

## 2015-01-20 DIAGNOSIS — I701 Atherosclerosis of renal artery: Secondary | ICD-10-CM

## 2015-01-20 NOTE — Telephone Encounter (Signed)
-----   Message from [name], MD sent at 01/18/2015  2:34 PM EDT ----- Stable renal duplex. Patent Left renal artery stent. Repeat 12 months

## 2015-01-20 NOTE — Telephone Encounter (Signed)
Letter sent notifying patient that stable renal ultra sound.  Pt orders placed for annual f/u in 2017

## 2015-01-30 ENCOUNTER — Ambulatory Visit (INDEPENDENT_AMBULATORY_CARE_PROVIDER_SITE_OTHER): Payer: Medicare Other | Admitting: *Deleted

## 2015-01-30 DIAGNOSIS — I5022 Chronic systolic (congestive) heart failure: Secondary | ICD-10-CM | POA: Diagnosis not present

## 2015-01-30 DIAGNOSIS — I428 Other cardiomyopathies: Secondary | ICD-10-CM

## 2015-01-30 NOTE — Progress Notes (Signed)
EPIC Encounter for ICM Monitoring  Patient Name: Scott Parsons is a 81 y.o. male Date: 01/30/2015 Primary Care Physican: [name], MD Primary Cardiologist: Allyson Sabal Electrophysiologist: Allred Dry Weight: 183 lbs       In the past month, have you:  1. Gained more than 2 pounds in a day or more than 5 pounds in a week? no  2. Had changes in your medications (with verification of current medications)? Yes, tapering dose of Prednisone  3. Had more shortness of breath than is usual for you? no  4. Limited your activity because of shortness of breath? no  5. Not been able to sleep because of shortness of breath? no  6. Had increased swelling in your feet or ankles? no  7. Had symptoms of dehydration (dizziness, dry mouth, increased thirst, decreased urine output) no  8. Had changes in sodium restriction? no  9. Been compliant with medication? Yes   ICM trend:   Follow-up plan: ICM clinic phone appointment 03/03/2015.  Optivol impedance slightly below baseline 12/28/2014 to 01/11/2015 and patient did not have any symptoms.  He is stable from a Optivol standpoint and his main problem is joint pain which PCP thought it may be related to a tick bite.  PCP is checking labs at [facility] and he has a follow up appointment.  Joint pain has decreased with tapering dose of Prednisone.  Education given to continue to monitor for fluid retention and Prednisone has possible side effect of weight gain and fluid retention.   Copy of note sent to patient's primary care physician, primary cardiologist, and device following physician.  Karie Soda, RN, CCM 01/30/2015 10:45 AM

## 2015-02-03 LAB — CUP PACEART REMOTE DEVICE CHECK
Battery Remaining Longevity: 90 mo
Battery Voltage: 3.01 V
Brady Statistic AP VS Percent: 0.86 %
Brady Statistic RA Percent Paced: 54.41 %
Date Time Interrogation Session: 20160922062823
HIGH POWER IMPEDANCE MEASURED VALUE: 75 Ohm
Lead Channel Impedance Value: 342 Ohm
Lead Channel Impedance Value: 361 Ohm
Lead Channel Impedance Value: 399 Ohm
Lead Channel Impedance Value: 437 Ohm
Lead Channel Impedance Value: 437 Ohm
Lead Channel Impedance Value: 437 Ohm
Lead Channel Impedance Value: 551 Ohm
Lead Channel Impedance Value: 570 Ohm
Lead Channel Impedance Value: 684 Ohm
Lead Channel Impedance Value: 703 Ohm
Lead Channel Pacing Threshold Pulse Width: 0.4 ms
Lead Channel Pacing Threshold Pulse Width: 0.4 ms
Lead Channel Sensing Intrinsic Amplitude: 28.625 mV
Lead Channel Setting Pacing Amplitude: 2.25 V
Lead Channel Setting Pacing Pulse Width: 0.4 ms
Lead Channel Setting Sensing Sensitivity: 0.3 mV
MDC IDC MSMT LEADCHNL LV IMPEDANCE VALUE: 342 Ohm
MDC IDC MSMT LEADCHNL LV IMPEDANCE VALUE: 437 Ohm
MDC IDC MSMT LEADCHNL LV IMPEDANCE VALUE: 684 Ohm
MDC IDC MSMT LEADCHNL LV PACING THRESHOLD AMPLITUDE: 1 V
MDC IDC MSMT LEADCHNL RA PACING THRESHOLD AMPLITUDE: 0.625 V
MDC IDC MSMT LEADCHNL RA SENSING INTR AMPL: 2.5 mV
MDC IDC MSMT LEADCHNL RA SENSING INTR AMPL: 2.5 mV
MDC IDC MSMT LEADCHNL RV PACING THRESHOLD AMPLITUDE: 0.625 V
MDC IDC MSMT LEADCHNL RV PACING THRESHOLD PULSEWIDTH: 0.4 ms
MDC IDC MSMT LEADCHNL RV SENSING INTR AMPL: 28.625 mV
MDC IDC SET LEADCHNL RA PACING AMPLITUDE: 2 V
MDC IDC SET LEADCHNL RV PACING AMPLITUDE: 2.5 V
MDC IDC SET LEADCHNL RV PACING PULSEWIDTH: 0.4 ms
MDC IDC STAT BRADY AP VP PERCENT: 53.55 %
MDC IDC STAT BRADY AS VP PERCENT: 44.52 %
MDC IDC STAT BRADY AS VS PERCENT: 1.07 %
MDC IDC STAT BRADY RV PERCENT PACED: 17.65 %
Zone Setting Detection Interval: 300 ms
Zone Setting Detection Interval: 350 ms
Zone Setting Detection Interval: 350 ms
Zone Setting Detection Interval: 360 ms

## 2015-02-07 ENCOUNTER — Encounter: Payer: Self-pay | Admitting: Cardiology

## 2015-02-19 ENCOUNTER — Encounter: Payer: Self-pay | Admitting: Internal Medicine

## 2015-03-03 ENCOUNTER — Ambulatory Visit (INDEPENDENT_AMBULATORY_CARE_PROVIDER_SITE_OTHER): Payer: Medicare Other

## 2015-03-03 DIAGNOSIS — I5042 Chronic combined systolic (congestive) and diastolic (congestive) heart failure: Secondary | ICD-10-CM

## 2015-03-03 DIAGNOSIS — I5022 Chronic systolic (congestive) heart failure: Secondary | ICD-10-CM

## 2015-03-03 DIAGNOSIS — I10 Essential (primary) hypertension: Secondary | ICD-10-CM

## 2015-03-03 NOTE — Progress Notes (Addendum)
EPIC Encounter for ICM Monitoring  Patient Name: Scott Parsons is a 81 y.o. male Date: 03/03/2015 Primary Care Physican: [name], MD Primary Cardiologist: Allyson Sabal Electrophysiologist: Allred Dry Weight: 185 lbs       In the past month, have you:  1. Gained more than 2 pounds in a day or more than 5 pounds in a week? Yes, Has gained 4 pounds in last week.  Weighed 182.8 on 02/27/2015 and increased to 186 on 03/01/2015.  On 03/02/2015 was 185.6.  2. Had changes in your medications (with verification of current medications)? Yes, Medrol 4mg  - 1 tablet daily and was prescribed by PCP due to severe back arthritis.  3. Had more shortness of breath than is usual for you? no  4. Limited your activity because of shortness of breath? no  5. Not been able to sleep because of shortness of breath? no  6. Had increased swelling in your feet or ankles? no  7. Had symptoms of dehydration (dizziness, dry mouth, increased thirst, decreased urine output) no  8. Had changes in sodium restriction? no  9. Been compliant with medication? Yes   ICM trend: 03/03/2015   Follow-up plan: ICM clinic phone appointment on 04/07/2015.  Optivol fluid index showed above baseline ~02/21/2015 and impedance below baseline ~02/22/2015 suggesting Scott Parsons may be retaining fluid. Impedance trending back toward baseline on 90 day zoom view on 03/03/2015.   Scott Parsons reported Scott Parsons gained weight on 03/01/2015 and took extra 1/2 dose of Lasix (20mg ) for a total of 60mg  that day.  Scott Parsons stated the pill increased urine output and weight dropped 1 lbs since that time.  His dry weight is ~ 183 lbs.  Advised to take 1/2 tablet (20mg ) of Lasix 40 mg today only and to call tomorrow with his weight.  Last Creatinine on 11/18/2014 was 1.83; 10/2014 1.73 and 07/2014 1.77; 04/2015 1.94 which were all drawn at [facility].    Will follow up with patient on 03/04/2015 for weight amount and possibility of labs may be needed.   Consulted with Gypsy Balsam, NP  in office regarding extra Lasix today and labs.  Information sent for review and will call back with any further recommendations.     Copy of note sent to patient's primary care physician, primary cardiologist, and device following physician.  Karie Soda, RN, CCM 03/03/2015 11:49 AM   Received call back from patient on 03/04/2015 to give weight update.  Scott Parsons reported his weight increased overnight to 185.6 but Scott Parsons stated Scott Parsons ate more food yesterday than normal.  Advised will check with Dr Allyson Sabal for recommendations regarding symptoms of weight gain and Optivol transmission below baseline suggesting the retention of fluid.   Call to Dr Neita Garnet office, PCP, and has no new BMP since July 2016.          Call to patient 03/05/2015 to obtain today's weight which was 185 lbs.  Denied any other heart failure symptoms except for the weight gain.                Recommendation from Dr Allyson Sabal per his nurse, Selena Batten. Scott Parsons recommends patient to take Furosemide 40mg  every other day, alternating with 60mg  every other day until Sunday 03/09/2015.  Scott Parsons should return to prescribed        dosage of Furosemide 40mg  daily 03/10/2015.  BMET on 03/10/2015.          Attempted call to patient to notify of recommendations and patient not at home and left a message  to call me back.          Patient notified of recommendation to take Furosemide  every other day alternating with  every other day until Sunday 03/09/2015, labs on Monday 03/10/2015 and return to prescribed dosage of Furosemide 40 mg daily.        Advised Dr Allyson Sabal will review labs when the results are back.

## 2015-03-04 ENCOUNTER — Telehealth: Payer: Self-pay

## 2015-03-04 NOTE — Telephone Encounter (Signed)
Call to PCP office, Dr [name], and asked if patient has had a BMP completed since July 2016 and she stated no.  Only recent lab work was a CBC and A1C.

## 2015-03-05 NOTE — Addendum Note (Signed)
Addended by: Karie Soda on: 03/05/2015 03:43 PM   Modules accepted: Orders

## 2015-03-10 ENCOUNTER — Other Ambulatory Visit (INDEPENDENT_AMBULATORY_CARE_PROVIDER_SITE_OTHER): Payer: Medicare Other | Admitting: *Deleted

## 2015-03-10 DIAGNOSIS — I5022 Chronic systolic (congestive) heart failure: Secondary | ICD-10-CM | POA: Diagnosis not present

## 2015-03-10 DIAGNOSIS — I10 Essential (primary) hypertension: Secondary | ICD-10-CM

## 2015-03-10 DIAGNOSIS — I5042 Chronic combined systolic (congestive) and diastolic (congestive) heart failure: Secondary | ICD-10-CM

## 2015-03-10 LAB — BASIC METABOLIC PANEL
BUN: 38 mg/dL — ABNORMAL HIGH (ref 7–25)
CALCIUM: 8.8 mg/dL (ref 8.6–10.3)
CHLORIDE: 103 mmol/L (ref 98–110)
CO2: 25 mmol/L (ref 20–31)
CREATININE: 1.9 mg/dL — AB (ref 0.70–1.11)
Glucose, Bld: 173 mg/dL — ABNORMAL HIGH (ref 65–99)
Potassium: 4.6 mmol/L (ref 3.5–5.3)
SODIUM: 137 mmol/L (ref 135–146)

## 2015-03-12 ENCOUNTER — Telehealth: Payer: Self-pay

## 2015-03-12 NOTE — Telephone Encounter (Signed)
Opened in error

## 2015-04-07 ENCOUNTER — Telehealth: Payer: Self-pay | Admitting: Cardiology

## 2015-04-07 ENCOUNTER — Ambulatory Visit (INDEPENDENT_AMBULATORY_CARE_PROVIDER_SITE_OTHER): Payer: Medicare Other

## 2015-04-07 DIAGNOSIS — I5042 Chronic combined systolic (congestive) and diastolic (congestive) heart failure: Secondary | ICD-10-CM

## 2015-04-07 DIAGNOSIS — Z95810 Presence of automatic (implantable) cardiac defibrillator: Secondary | ICD-10-CM | POA: Diagnosis not present

## 2015-04-07 NOTE — Telephone Encounter (Signed)
LMOVM reminding pt to send remote transmission.   

## 2015-04-08 NOTE — Progress Notes (Signed)
EPIC Encounter for ICM Monitoring  Patient Name: Scott Parsons is a 81 y.o. male Date: 04/08/2015 Primary Care Physican: [name], MD Primary Cardiologist: Allyson Sabal Electrophysiologist: Allred Dry Weight: 188 lbs       In the past month, have you:  1. Gained more than 2 pounds in a day or more than 5 pounds in a week? Yes, 4 lb weight gain in 5 days but has dropped 1 lb since yesterday.  Was 184 lbs last week, 189 yesterday and 188 lbs today.    2. Had changes in your medications (with verification of current medications)? no  3. Had more shortness of breath than is usual for you? no  4. Limited your activity because of shortness of breath? no  5. Not been able to sleep because of shortness of breath? no  6. Had increased swelling in your feet or ankles? no  7. Had symptoms of dehydration (dizziness, dry mouth, increased thirst, decreased urine output) no  8. Had changes in sodium restriction? no  9. Been compliant with medication? Yes   ICM trend:  1 year view    ICM trend 90 day view  Follow-up plan: ICM clinic phone appointment on 04/14/2015 for repeat transmission.  Optivol thoacic impedance below base 04/01/2015 to 04/07/2015 suggesting fluid retention.  He had 4 lb weight gain in last 5 days but lost 1 lb last night.  Denied any other fluid retention symptoms.  He reported eating differently over the holiday.  Advised to return to low sodium diet and limit fluids to 64 oz daily.  He reported he will be changing pharmacies at the first of the year due to [facility] will not contract with [facility].  He will be using [facility] and added to pharmacy list.    Advised would forward to Dr Allyson Sabal and Dr Johney Frame for review and if any recommendations will call him back.    Last Creatinine on 03/10/2015 was 1.90.  Copy of note sent to patient's primary care physician, primary cardiologist, and device following physician.  Karie Soda, RN, CCM 04/08/2015 12:02 PM

## 2015-04-09 NOTE — Progress Notes (Signed)
Reviewed with MD, Orders given: Pt to take 40 mg of Lasix BID for five days. Then get BMET and BNP and pt return to 40 mg Lasix daily.

## 2015-04-09 NOTE — Progress Notes (Signed)
Patient called and notified of Dr Hazle Coca recommendations to take Furosemide (Lasix) 40mg  bid x 5 days starting today, labs for Tuesday 04/15/2015 and return to Furosemide (Lasix) 40mg  daily on 04/15/2015.  He verbalized understanding.  Advised if he has any muscle cramping, nausea, vomiting, dizziness or weakness to call back.

## 2015-04-14 ENCOUNTER — Ambulatory Visit (INDEPENDENT_AMBULATORY_CARE_PROVIDER_SITE_OTHER): Payer: Medicare Other

## 2015-04-14 DIAGNOSIS — I5042 Chronic combined systolic (congestive) and diastolic (congestive) heart failure: Secondary | ICD-10-CM

## 2015-04-14 DIAGNOSIS — Z95810 Presence of automatic (implantable) cardiac defibrillator: Secondary | ICD-10-CM

## 2015-04-14 NOTE — Progress Notes (Signed)
EPIC Encounter for ICM Monitoring  Patient Name: Scott Parsons is a 81 y.o. male Date: 04/14/2015 Primary Care Physican: [name], MD Primary Cardiologist: Allyson Sabal Electrophysiologist: Allred Dry Weight: 184.6 lb       In the past month, have you:  1. Gained more than 2 pounds in a day or more than 5 pounds in a week? no  2. Had changes in your medications (with verification of current medications)? no  3. Had more shortness of breath than is usual for you? no  4. Limited your activity because of shortness of breath? no  5. Not been able to sleep because of shortness of breath? no  6. Had increased swelling in your feet or ankles? no  7. Had symptoms of dehydration (dizziness, dry mouth, increased thirst, decreased urine output) no  8. Had changes in sodium restriction? no  9. Been compliant with medication? Yes   ICM trend:   Follow-up plan: ICM clinic phone appointment on 05/06/2015.  Optivol thoracic impedance returned to baseline after taking extra Furosemide dosages x 5 days.  Patient's weight back to baseline.  He denied any HF symptoms today and feels fine.  He confirmed he will have labs drawn tomorrow as ordered last week.  No changes today.    Copy of note sent to patient's primary care physician, primary cardiologist, and device following physician.  Karie Soda, RN, CCM 04/14/2015 3:21 PM

## 2015-04-15 ENCOUNTER — Other Ambulatory Visit: Payer: Medicare Other | Admitting: *Deleted

## 2015-04-15 DIAGNOSIS — I5042 Chronic combined systolic (congestive) and diastolic (congestive) heart failure: Secondary | ICD-10-CM

## 2015-04-15 LAB — BASIC METABOLIC PANEL
BUN: 43 mg/dL — AB (ref 7–25)
CHLORIDE: 101 mmol/L (ref 98–110)
CO2: 29 mmol/L (ref 20–31)
Calcium: 9.5 mg/dL (ref 8.6–10.3)
Creat: 1.83 mg/dL — ABNORMAL HIGH (ref 0.70–1.11)
Glucose, Bld: 155 mg/dL — ABNORMAL HIGH (ref 65–99)
Potassium: 4.5 mmol/L (ref 3.5–5.3)
SODIUM: 141 mmol/L (ref 135–146)

## 2015-04-16 LAB — BRAIN NATRIURETIC PEPTIDE: Brain Natriuretic Peptide: 173.3 pg/mL — ABNORMAL HIGH (ref 0.0–100.0)

## 2015-04-18 ENCOUNTER — Telehealth: Payer: Self-pay

## 2015-04-18 NOTE — Telephone Encounter (Signed)
Telephone call to patient and provided lab results. He verbalized understanding.  Next ICM transmission 05/06/2015.

## 2015-04-21 NOTE — Telephone Encounter (Signed)
Spoke with patient 04/14/2015 for ICM transmission review

## 2015-05-06 ENCOUNTER — Ambulatory Visit (INDEPENDENT_AMBULATORY_CARE_PROVIDER_SITE_OTHER): Payer: Medicare Other | Admitting: *Deleted

## 2015-05-06 DIAGNOSIS — I429 Cardiomyopathy, unspecified: Secondary | ICD-10-CM | POA: Diagnosis not present

## 2015-05-06 DIAGNOSIS — I5022 Chronic systolic (congestive) heart failure: Secondary | ICD-10-CM | POA: Diagnosis not present

## 2015-05-06 DIAGNOSIS — Z95810 Presence of automatic (implantable) cardiac defibrillator: Secondary | ICD-10-CM

## 2015-05-06 DIAGNOSIS — I428 Other cardiomyopathies: Secondary | ICD-10-CM

## 2015-05-06 NOTE — Progress Notes (Signed)
Remote ICD transmission.   

## 2015-05-07 ENCOUNTER — Telehealth: Payer: Self-pay

## 2015-05-07 NOTE — Telephone Encounter (Signed)
ICM transmission received.  Attempted call.  Left message for return call.

## 2015-05-08 ENCOUNTER — Encounter: Payer: Self-pay | Admitting: Cardiology

## 2015-05-08 ENCOUNTER — Ambulatory Visit (HOSPITAL_COMMUNITY)
Admission: RE | Admit: 2015-05-08 | Discharge: 2015-05-08 | Disposition: A | Discharge status: Home / Self Care | Payer: Medicare Other | Source: Ambulatory Visit | Attending: Cardiovascular Disease | Admitting: Cardiovascular Disease

## 2015-05-08 DIAGNOSIS — R7303 Prediabetes: Secondary | ICD-10-CM | POA: Insufficient documentation

## 2015-05-08 DIAGNOSIS — I739 Peripheral vascular disease, unspecified: Secondary | ICD-10-CM | POA: Insufficient documentation

## 2015-05-08 DIAGNOSIS — E785 Hyperlipidemia, unspecified: Secondary | ICD-10-CM | POA: Diagnosis not present

## 2015-05-08 DIAGNOSIS — I10 Essential (primary) hypertension: Secondary | ICD-10-CM | POA: Insufficient documentation

## 2015-05-08 DIAGNOSIS — I6523 Occlusion and stenosis of bilateral carotid arteries: Secondary | ICD-10-CM | POA: Diagnosis not present

## 2015-05-08 LAB — CUP PACEART REMOTE DEVICE CHECK
Battery Voltage: 2.99 V
Brady Statistic AP VP Percent: 66.41 %
Brady Statistic AS VP Percent: 31.27 %
Brady Statistic AS VS Percent: 1.2 %
Brady Statistic RV Percent Paced: 34.42 %
HighPow Impedance: 61 Ohm
Implantable Lead Implant Date: 20150507
Implantable Lead Implant Date: 20150507
Implantable Lead Implant Date: 20150507
Implantable Lead Location: 753858
Implantable Lead Location: 753860
Implantable Lead Model: 4598
Implantable Lead Model: 5076
Implantable Lead Model: 6935
Lead Channel Impedance Value: 342 Ohm
Lead Channel Impedance Value: 342 Ohm
Lead Channel Impedance Value: 361 Ohm
Lead Channel Impedance Value: 494 Ohm
Lead Channel Impedance Value: 551 Ohm
Lead Channel Impedance Value: 570 Ohm
Lead Channel Impedance Value: 570 Ohm
Lead Channel Pacing Threshold Amplitude: 0.625 V
Lead Channel Pacing Threshold Amplitude: 1.25 V
Lead Channel Pacing Threshold Pulse Width: 0.4 ms
Lead Channel Pacing Threshold Pulse Width: 0.4 ms
Lead Channel Sensing Intrinsic Amplitude: 2.25 mV
Lead Channel Sensing Intrinsic Amplitude: 2.25 mV
Lead Channel Setting Pacing Amplitude: 2 V
Lead Channel Setting Pacing Amplitude: 2.5 V
Lead Channel Setting Pacing Pulse Width: 0.4 ms
MDC IDC LEAD LOCATION: 753859
MDC IDC MSMT BATTERY REMAINING LONGEVITY: 86 mo
MDC IDC MSMT LEADCHNL LV IMPEDANCE VALUE: 361 Ohm
MDC IDC MSMT LEADCHNL LV IMPEDANCE VALUE: 399 Ohm
MDC IDC MSMT LEADCHNL LV IMPEDANCE VALUE: 551 Ohm
MDC IDC MSMT LEADCHNL LV IMPEDANCE VALUE: 570 Ohm
MDC IDC MSMT LEADCHNL RA PACING THRESHOLD AMPLITUDE: 0.5 V
MDC IDC MSMT LEADCHNL RA PACING THRESHOLD PULSEWIDTH: 0.4 ms
MDC IDC MSMT LEADCHNL RV IMPEDANCE VALUE: 361 Ohm
MDC IDC MSMT LEADCHNL RV IMPEDANCE VALUE: 418 Ohm
MDC IDC MSMT LEADCHNL RV SENSING INTR AMPL: 23.375 mV
MDC IDC MSMT LEADCHNL RV SENSING INTR AMPL: 23.375 mV
MDC IDC SESS DTM: 20161227132824
MDC IDC SET LEADCHNL LV PACING AMPLITUDE: 2.25 V
MDC IDC SET LEADCHNL LV PACING PULSEWIDTH: 0.4 ms
MDC IDC SET LEADCHNL RV SENSING SENSITIVITY: 0.3 mV
MDC IDC STAT BRADY AP VS PERCENT: 1.12 %
MDC IDC STAT BRADY RA PERCENT PACED: 67.53 %

## 2015-05-08 NOTE — Telephone Encounter (Signed)
Attempted call to patient and person answering phone stated he was not home.  

## 2015-05-08 NOTE — Telephone Encounter (Signed)
Received voice mail message from patient returning call on 05/07/2015.

## 2015-05-09 ENCOUNTER — Telehealth: Payer: Self-pay

## 2015-05-09 DIAGNOSIS — I779 Disorder of arteries and arterioles, unspecified: Secondary | ICD-10-CM

## 2015-05-09 DIAGNOSIS — I739 Peripheral vascular disease, unspecified: Principal | ICD-10-CM

## 2015-05-09 NOTE — Progress Notes (Signed)
EPIC Encounter for ICM Monitoring  Patient Name: Scott Parsons is a 81 y.o. male Date: 05/09/2015 Primary Care Physican: [name], MD Primary Cardiologist: Allyson Sabal Electrophysiologist: Allred Dry Weight: 191 lb   Bi-V Pacing 94%      In the past month, have you:  Gained more than 2 pounds in a day or more than 5 pounds in a week? No, has gained 6 pounds since 04/14/2015 at last ICM call.  He reported he has been eating a lot and thinks the gain is related to food instead of fluid.   1. Had changes in your medications (with verification of current medications)? no  2. Had more shortness of breath than is usual for you? no  3. Limited your activity because of shortness of breath? no  4. Not been able to sleep because of shortness of breath? no  5. Had increased swelling in your feet or ankles? no  6. Had symptoms of dehydration (dizziness, dry mouth, increased thirst, decreased urine output) no  7. Had changes in sodium restriction? no  8. Been compliant with medication? Yes   ICM trend: 05/09/2015 - 1 year view  ICM Trend:  3 month view    Follow-up plan: ICM clinic phone appointment on 06/10/2015.  Optivol impedance on 05/06/2015 transmission was below baseline 05/01/2015 suggesting fluid retention and he reported weight gain over the last 3 weeks.  Requested he send updated transmission today and it showed improvement in impedance.  He stated he has been eating a lot of holiday foods.  Encouraged him to return to low sodium diet to decrease the fluid retention.  Advised to call should he experience any HF symptoms.  No changes today.    Copy of note sent to patient's primary care physician, primary cardiologist, and device following physician.  Karie Soda, RN, CCM 05/09/2015 2:13 PM

## 2015-05-09 NOTE — Telephone Encounter (Signed)
Spoke with patient.

## 2015-05-09 NOTE — Telephone Encounter (Signed)
-----   Message from [name], MD sent at 05/09/2015  8:14 AM EST ----- Mod Bilataeral ICA stenosis. Repeat 12 months

## 2015-05-16 ENCOUNTER — Ambulatory Visit (HOSPITAL_COMMUNITY)
Admission: RE | Admit: 2015-05-16 | Discharge: 2015-05-16 | Disposition: A | Discharge status: Home / Self Care | Payer: Medicare Other | Source: Ambulatory Visit | Attending: Cardiovascular Disease | Admitting: Cardiovascular Disease

## 2015-05-16 ENCOUNTER — Other Ambulatory Visit: Payer: Self-pay | Admitting: Cardiovascular Disease

## 2015-05-16 DIAGNOSIS — I708 Atherosclerosis of other arteries: Secondary | ICD-10-CM | POA: Diagnosis not present

## 2015-05-16 DIAGNOSIS — I739 Peripheral vascular disease, unspecified: Secondary | ICD-10-CM | POA: Diagnosis not present

## 2015-05-16 DIAGNOSIS — R7303 Prediabetes: Secondary | ICD-10-CM | POA: Diagnosis not present

## 2015-05-16 DIAGNOSIS — E785 Hyperlipidemia, unspecified: Secondary | ICD-10-CM | POA: Diagnosis not present

## 2015-05-16 DIAGNOSIS — I10 Essential (primary) hypertension: Secondary | ICD-10-CM | POA: Insufficient documentation

## 2015-05-26 DIAGNOSIS — M25512 Pain in left shoulder: Secondary | ICD-10-CM

## 2015-05-26 DIAGNOSIS — M25511 Pain in right shoulder: Secondary | ICD-10-CM | POA: Insufficient documentation

## 2015-05-29 ENCOUNTER — Other Ambulatory Visit: Payer: Self-pay

## 2015-05-29 DIAGNOSIS — I739 Peripheral vascular disease, unspecified: Secondary | ICD-10-CM

## 2015-06-10 ENCOUNTER — Ambulatory Visit (INDEPENDENT_AMBULATORY_CARE_PROVIDER_SITE_OTHER): Payer: Medicare Other

## 2015-06-10 DIAGNOSIS — Z95810 Presence of automatic (implantable) cardiac defibrillator: Secondary | ICD-10-CM

## 2015-06-10 DIAGNOSIS — I5022 Chronic systolic (congestive) heart failure: Secondary | ICD-10-CM | POA: Diagnosis not present

## 2015-06-11 ENCOUNTER — Telehealth: Payer: Self-pay

## 2015-06-11 NOTE — Telephone Encounter (Signed)
Remote ICM transmission received.  Attempted patient call and left message for return call.   

## 2015-06-11 NOTE — Telephone Encounter (Signed)
Spoke with patient.

## 2015-06-11 NOTE — Telephone Encounter (Signed)
Returning your call. °

## 2015-06-11 NOTE — Progress Notes (Signed)
EPIC Encounter for ICM Monitoring  Patient Name: Scott Parsons is a 82 y.o. male Date: 06/11/2015 Primary Care Physican: [name], MD Primary Cardiologist: Allyson Sabal Electrophysiologist: Allred Dry Weight: 191 lbs  Bi-V Pacing 97.4%       In the past month, have you:  1. Gained more than 2 pounds in a day or more than 5 pounds in a week? no  2. Had changes in your medications (with verification of current medications)? no  3. Had more shortness of breath than is usual for you? no  4. Limited your activity because of shortness of breath? no  5. Not been able to sleep because of shortness of breath? no  6. Had increased swelling in your feet or ankles? no  7. Had symptoms of dehydration (dizziness, dry mouth, increased thirst, decreased urine output) no  8. Had changes in sodium restriction? no  9. Been compliant with medication? Yes   ICM trend: 3 month view for 06/10/2015   ICM trend: 1 year view for 06/10/2015   Follow-up plan: ICM clinic phone appointment on 07/21/2015.  Optivol thoracic impedance trending along reference line.  Activity level 1.6 hr/day Patient reported he is feeling well and denied any fluid retention symptoms and encouraged him to call if he should have any symptoms.   No changes today.   Copy of note sent to patient's primary care physician, primary cardiologist, and device following physician.  Karie Soda, RN, CCM 06/11/2015 11:25 AM

## 2015-07-08 ENCOUNTER — Other Ambulatory Visit: Payer: Self-pay | Admitting: *Deleted

## 2015-07-08 MED ORDER — FUROSEMIDE 40 MG PO TABS: 40.0000 mg | ORAL_TABLET | Freq: Every day | ORAL | Status: DC

## 2015-07-10 ENCOUNTER — Telehealth: Payer: Self-pay

## 2015-07-10 ENCOUNTER — Ambulatory Visit (INDEPENDENT_AMBULATORY_CARE_PROVIDER_SITE_OTHER): Payer: Medicare Other

## 2015-07-10 DIAGNOSIS — Z95810 Presence of automatic (implantable) cardiac defibrillator: Secondary | ICD-10-CM

## 2015-07-10 DIAGNOSIS — I5022 Chronic systolic (congestive) heart failure: Secondary | ICD-10-CM

## 2015-07-10 MED ORDER — FUROSEMIDE 40 MG PO TABS: 40.0000 mg | ORAL_TABLET | Freq: Every day | ORAL | Status: DC

## 2015-07-10 NOTE — Progress Notes (Signed)
EPIC Encounter for ICM Monitoring  Patient Name: Scott Parsons is a 82 y.o. male Date: 07/10/2015 Primary Care Physican: [name], MD Primary Cardiologist: Allyson Sabal Electrophysiologist: Allred Dry Weight: 195 lbs  Bi-V Pacing 89.5% (06/10/2015 was 97.4%)      In the past month, have you:  1. Gained more than 2 pounds in a day or more than 5 pounds in a week? Yes, 4 lbs in last week  2. Had changes in your medications (with verification of current medications)? no  3. Had more shortness of breath than is usual for you? no  4. Limited your activity because of shortness of breath? no  5. Not been able to sleep because of shortness of breath? no  6. Had increased swelling in your feet or ankles? no  7. Had symptoms of dehydration (dizziness, dry mouth, increased thirst, decreased urine output) no  8. Had changes in sodium restriction? no  9. Been compliant with medication? Yes   ICM trend: 3 month view for 07/10/2015   ICM trend: 1 year view for 07/10/2015   Follow-up plan: ICM clinic phone appointment 07/21/2015.  Received call from patient stating he was gaining weight.   Optivol thoracic impedance below reference line 06/18/2015 suggesting fluid accumulation.  He reported weight gain but denied other fluid symptoms.  He is unsure cause of the fluid gain.  He reported his appetite has been really good and eating more food.  Advised the increase in food may be an increase in salt intake as well depending on what foods he is choosing to eat.  Advised to maintain low salt diet.    Patient reported he needs refill of Furosemide due to current script has no refills.    03/10/2015   Potassium 4.6, BUN 38, Creatinine 1.90       04/15/2015   Potassium 4.5, BUN 43, Creatinine 1.83               Advised would send to Dr Johney Frame and Dr Allyson Sabal for review and recommendations regarding weight gain of 4 lbs and optivol suggesting fluid accumulation.   Copy of note sent to patient's primary  care physician, primary cardiologist, and device following physician.  Karie Soda, RN, CCM 07/10/2015 8:49 AM

## 2015-07-10 NOTE — Telephone Encounter (Signed)
Call to patient.  Asked him to send a remote ICM transmission to check for fluid weight gain.  Advised will call back as soon as I receive it.

## 2015-07-10 NOTE — Telephone Encounter (Signed)
Received voice mail message from patient stating he was having weight gain but no other symptoms.  He is unsure if it is fluid weight gain and requested call back.

## 2015-07-11 ENCOUNTER — Other Ambulatory Visit (INDEPENDENT_AMBULATORY_CARE_PROVIDER_SITE_OTHER): Payer: Medicare Other | Admitting: *Deleted

## 2015-07-11 DIAGNOSIS — I5022 Chronic systolic (congestive) heart failure: Secondary | ICD-10-CM | POA: Diagnosis not present

## 2015-07-11 LAB — BRAIN NATRIURETIC PEPTIDE: B Natriuretic Peptide: 240.2 pg/mL — ABNORMAL HIGH (ref 0.0–100.0)

## 2015-07-11 LAB — BASIC METABOLIC PANEL
Anion gap: 9 (ref 5–15)
BUN: 39 mg/dL — AB (ref 6–20)
CHLORIDE: 104 mmol/L (ref 101–111)
CO2: 27 mmol/L (ref 22–32)
CREATININE: 2.11 mg/dL — AB (ref 0.61–1.24)
Calcium: 9.5 mg/dL (ref 8.9–10.3)
GFR calc Af Amer: 32 mL/min — ABNORMAL LOW (ref >60)
GFR calc non Af Amer: 27 mL/min — ABNORMAL LOW (ref >60)
GLUCOSE: 134 mg/dL — AB (ref 65–99)
Potassium: 4.2 mmol/L (ref 3.5–5.1)
Sodium: 140 mmol/L (ref 135–145)

## 2015-07-11 NOTE — Progress Notes (Signed)
Spoke with patient and reviewed labs.  Advised would discuss with Dr Tenny Craw if any further med changes are needed.  He stated he has lost 2 lbs since yesterday of the 4 he had gained.    Stat labs reviewed with Dr Tenny Craw, DOD and creatinine was 2.11 after patient took Furosemide 80 mg today.  Explained patient has lost 2 lbs since yesterday.  Will repeat transmission on 07/14/2015 and patient should stay on prescribed Furosemide dose of 40 mg daily.     Patient notified of repeat transmission on 07/14/2014 and to stay on prescribed dose of Furosemide 40 mg one tablet daily.  Encoruaged to follow low salt diet and to continue daily weights.   Advised to use local emergency should he have any urgent fluid symptoms over the weekend.

## 2015-07-11 NOTE — Progress Notes (Signed)
Reviewed transmission and patient symptoms with Dr Dietrich Pates, DOD.  She ordered patient take Furosemide 80 mg just one time dose for today.  She ordered stat BMET and BNP for today and will evaluate results and any further treatment if needed.    Notified patient of orders and he will be in the office as soon as possible today.

## 2015-07-14 ENCOUNTER — Ambulatory Visit (INDEPENDENT_AMBULATORY_CARE_PROVIDER_SITE_OTHER): Payer: Medicare Other

## 2015-07-14 DIAGNOSIS — Z95810 Presence of automatic (implantable) cardiac defibrillator: Secondary | ICD-10-CM

## 2015-07-14 DIAGNOSIS — I5022 Chronic systolic (congestive) heart failure: Secondary | ICD-10-CM

## 2015-07-14 NOTE — Progress Notes (Signed)
EPIC Encounter for ICM Monitoring  Patient Name: Scott Parsons is a 82 y.o. male Date: 07/14/2015 Primary Care Physican: [name], MD Primary Cardiologist: Allyson Sabal Electrophysiologist: Allred Dry Weight: 195 lbs   Bi-V Pacing 82.3% (06/10/2015 was 97.4% and 89.5% on 07/11/2015)      In the past month, have you:  1. Gained more than 2 pounds in a day or more than 5 pounds in a week? Weight gain since 06/16/2015 has been 4-5 lbs  2. Had changes in your medications (with verification of current medications)? no  3. Had more shortness of breath than is usual for you? no  4. Limited your activity because of shortness of breath? no  5. Not been able to sleep because of shortness of breath? no  6. Had increased swelling in your feet or ankles? no  7. Had symptoms of dehydration (dizziness, dry mouth, increased thirst, decreased urine output) no  8. Had changes in sodium restriction? no  9. Been compliant with medication? Yes   ICM trend: 3 month view for 07/14/2015   ICM trend: 1 year view for 07/14/2015   Follow-up plan: ICM clinic phone appointment 07/21/2015.   Optivol thoracic impedance improved after taking extra dose of Furosemide on 07/11/2015 but then returned back to below reference line.  Advised patient to limit salt intake.  His weight remains at 195 lbs, same as last week.  Denied other fluid symptoms.      LABS:   07/11/2015 Creatinine 2.11, BUN 39, Potassium 4.2       04/15/2015 Potassium 4.5, BUN 43, Creatinine 1.83 03/10/2015 Potassium 4.6, BUN 38, Creatinine 1.90   Send to Dr Allyson Sabal and Dr Johney Frame for review and recommendations.     Copy of note sent to patient's primary care physician, primary cardiologist, and device following physician.  Karie Soda, RN, CCM 07/14/2015 8:11 AM  Reviewed in office with Dr Johney Frame and he consulted with Dr Allyson Sabal.  Requested patient be seen by flex PA or NP at [facility] office at next available appointment.   Call to  patient and notified to call [facility] office for appointment with NP or PA for next available appointment.  His weight remains unchanged today, 195 lbs.  He reported he will call.  Advised to call back if he has any problems.

## 2015-07-21 ENCOUNTER — Ambulatory Visit (INDEPENDENT_AMBULATORY_CARE_PROVIDER_SITE_OTHER): Payer: Medicare Other

## 2015-07-21 DIAGNOSIS — I5022 Chronic systolic (congestive) heart failure: Secondary | ICD-10-CM

## 2015-07-21 DIAGNOSIS — Z95810 Presence of automatic (implantable) cardiac defibrillator: Secondary | ICD-10-CM

## 2015-07-21 NOTE — Progress Notes (Signed)
EPIC Encounter for ICM Monitoring  Patient Name: Scott Parsons is a 82 y.o. male Date: 07/21/2015 Primary Care Physican: [name], MD Primary Cardiologist: Allyson Sabal Electrophysiologist: Allred Dry Weight: 193.2 lb   Bi-V Pacing 85.5% (06/10/2015 was 97.4%; 07/11/2015 was 89.5%; 07/14/2015 was 82.3%)      In the past month, have you:  1. Gained more than 2 pounds in a day or more than 5 pounds in a week? Lost 1.5 lbs since 07/14/2015  2. Had changes in your medications (with verification of current medications)? no  3. Had more shortness of breath than is usual for you? no  4. Limited your activity because of shortness of breath? no  5. Not been able to sleep because of shortness of breath? no  6. Had increased swelling in your feet or ankles? no  7. Had symptoms of dehydration (dizziness, dry mouth, increased thirst, decreased urine output) no  8. Had changes in sodium restriction? no  9. Been compliant with medication? Yes   ICM trend: 3 month view for 07/21/2015   ICM trend: 1 year view for 07/21/2015   Follow-up plan: ICM clinic phone appointment 08/25/2015.  Thoracic impedance continues below reference line since 06/15/2015 suggesting fluid accumulation and correlates with weight gain.  Thoracic impedance did return to reference line on 07/10/2015 when he took extra 40 mg of Furosemide that day.  Weight continues to be elevated from his baseline which is around 191 lbs.   Patient reported he thought he followed low sodium diet but after discussing examples of foods he eats daily, the sodium intake may be higher than 2000 mg.   He reported he stopped eating potato chips about 10 days ago. Looked up some sodium levels of foods he eats including cereal, sandwich cold cut meat with bread for lunch and he has ice cream at night which is about 300 mg per serving.   Advised it is better to change diet than having to increase his medication if possible.  Education given to limit sodium intake  to < 2000 mg and fluid intake to 64 oz daily.  Encouraged to call for any fluid symptoms.  No changes today due to patient has appointment with Boyce Medici PA on 07/29/2015.  Encouraged to call should he experience more weight gain or other fluid symptoms.   Copy of note sent to patient's primary care physician, primary cardiologist, and device following physician.  Karie Soda, RN, CCM 07/21/2015 9:32 AM

## 2015-07-29 ENCOUNTER — Ambulatory Visit (INDEPENDENT_AMBULATORY_CARE_PROVIDER_SITE_OTHER): Payer: Medicare Other | Admitting: Cardiology

## 2015-07-29 ENCOUNTER — Encounter: Payer: Self-pay | Admitting: Cardiology

## 2015-07-29 ENCOUNTER — Telehealth: Payer: Self-pay

## 2015-07-29 VITALS — BP 110/50 | HR 74 | Ht 74.0 in | Wt 198.4 lb

## 2015-07-29 DIAGNOSIS — I5042 Chronic combined systolic (congestive) and diastolic (congestive) heart failure: Secondary | ICD-10-CM

## 2015-07-29 NOTE — Patient Instructions (Signed)
Medication Instructions:  Your physician has recommended you make the following change in your medication:  STOP Losartan. Take all other medications as prescribed.   Labwork: Your physician recommends that you return for lab work in: 1 week (Bmet)   Testing/Procedures: None ordered  Follow-Up: Your physician recommends that you schedule a follow-up appointment in: 1 week with the Pharmacist for a BP check  Your physician wants you to follow-up in: 6 months with Dr.Berry You will receive a reminder letter in the mail two months in advance. If you don't receive a letter, please call our office to schedule the follow-up appointment.   Any Other Special Instructions Will Be Listed Below (If Applicable). Please limit sodium in your diet  Your physician recommends that you weigh, daily, at the same time every day, and in the same amount of clothing. Please record your daily weights. It is ok to take an additional Lasix if your weight is up 3lbs in a day.       If you need a refill on your cardiac medications before your next appointment, please call your pharmacy.

## 2015-07-29 NOTE — Progress Notes (Signed)
07/29/2015 Scott Parsons   06/04/1929  [id number]  Primary Physician [name], MD Primary Cardiologist: Dr. Allyson Sabal Electrophysiologist: Dr. Johney Frame  Reason for Visit/CC: Fluid Retention  HPI:  82 y/o male, followed by Dr. Allyson Sabal. He has a h/o noncritical CAD by cath performed in 2003. Also with a h/o nonischemic cardiomyopathy with EF as low a 15-20%, s/p AICD followed by Dr. Johney Frame. He has PVOD with bilateral carotid disease left greater than right which we follow by duplex ultrasound. He has also undergone stenting of his left renal artery October 18, 2005, as well as both iliac arteries. He does have moderate SFA disease bilaterally. All of this is followed routinely by Dr. Allyson Sabal. His other problems include hypertension and hyperlipidemia. His last Myoview performed December 08, 2009, revealed inferior scar. Dr. [name], PCP, follows his lipid profile.   He presents to clinic today with complaint of fluid retention. This first occurred several weeks ago. His weight increased 4 lb overnight and he took an extra Lasix tablet, which improved his weight back to baseline. Optival suggested increased fluid levels. A BNP and BMP were ordered 3 weeks ago. BNP at that time was elevated at 240. BMP showed SCr of 2.11 (up from 1.7-1.9). K was WNL at 4.2.   Today in clinic, he reports that his weight has been stable over the past week. He is still taking 40 mg of Lasix daily and knows to increase by an extra tablet if >3lb weight gain in 24 hrs. He denies dyspnea. No symptoms with exertion. No LEE, orthopnea or PND. He also denies CP. He has been fully compliant with low sodium diet.   Also of importance, the patient has been on an ACE + ARB for reasons unknown.     Current Outpatient Prescriptions  Medication Sig Dispense Refill  . allopurinol (ZYLOPRIM) 100 MG tablet Take 100 mg by mouth daily.    [name] aspirin EC 81 MG tablet Take 81 mg by mouth daily.    [name] atorvastatin (LIPITOR) 20 MG tablet Take 20  mg by mouth daily.    . candesartan (ATACAND) 8 MG tablet Take 8 mg by mouth daily.  3  . clopidogrel (PLAVIX) 75 MG tablet Take 75 mg by mouth daily.    . furosemide (LASIX) 40 MG tablet Take 1 tablet (40 mg total) by mouth daily. 30 tablet 6  . isosorbide mononitrate (IMDUR) 30 MG 24 hr tablet Take 30 mg by mouth daily.    [name] lisinopril (PRINIVIL,ZESTRIL) 40 MG tablet Take 0.5 tablets (20 mg total) by mouth daily. 30 tablet 11  . Magnesium 500 MG CAPS Take 500 mg by mouth daily.     . methylPREDNISolone (MEDROL) 4 MG tablet 1-2 daily    . metoprolol (TOPROL-XL) 200 MG 24 hr tablet Take 0.5 tablets by mouth daily. Takes along with  Toprol XL 100 mg tablet (1/2)  11  . metoprolol succinate (TOPROL-XL) 100 MG 24 hr tablet Take 50 mg by mouth daily. Takes with 100mg  to equal 150mg  daily    . metoprolol succinate (TOPROL-XL) 100 MG 24 hr tablet Take 1 tablet (100 mg total) by mouth daily. Take with 50mg  tablet to equal 150mg . 30 tablet 7  . niacin 500 MG tablet Take 500 mg by mouth daily with breakfast.    . NITROSTAT 0.4 MG SL tablet Place 0.4 mg under the tongue every 5 (five) minutes as needed for chest pain (MAX 3 TABLETS).     . NON FORMULARY Wears O2  at night (2 L)    . NON FORMULARY Use as directed - [employer] diabetic skincare    . PROAIR HFA 108 (90 BASE) MCG/ACT inhaler Inhale 2 puffs into the lungs every 12 (twelve) hours as needed for wheezing.   3  . spironolactone (ALDACTONE) 25 MG tablet Take 12.5 mg by mouth daily.    [name] zolpidem (AMBIEN) 10 MG tablet Take 5 mg by mouth at bedtime.      No current facility-administered medications for this visit.    Allergies  Allergen Reactions  . Amoxicillin Itching    Itching    . Biaxin [Clarithromycin] Itching    Lip swelling     . Erythromycin Itching  . Penicillins Itching  . Shellfish Allergy Itching          Social History   Social History  . Marital Status: Married    Spouse Name: N/A  . Number of Children: N/A  . Years  of Education: N/A   Occupational History  . Not on file.   Social History Main Topics  . Smoking status: Former Smoker -- 52 years    Types: Cigarettes    Quit date: 08/11/2004  . Smokeless tobacco: Never Used  . Alcohol Use: No  . Drug Use: No  . Sexual Activity: Not on file   Other Topics Concern  . Not on file   Social History Narrative   Retired     Review of Systems: General: negative for chills, fever, night sweats or weight changes.  Cardiovascular: negative for chest pain, dyspnea on exertion, edema, orthopnea, palpitations, paroxysmal nocturnal dyspnea or shortness of breath Dermatological: negative for rash Respiratory: negative for cough or wheezing Urologic: negative for hematuria Abdominal: negative for nausea, vomiting, diarrhea, bright red blood per rectum, melena, or hematemesis Neurologic: negative for visual changes, syncope, or dizziness All other systems reviewed and are otherwise negative except as noted above.    Blood pressure 110/50, pulse 74, height 6\' 2"  (1.88 m), weight 198 lb 6.4 oz (89.994 kg).  General appearance: alert, cooperative and no distress Neck: no carotid bruit and no JVD Lungs: clear to auscultation bilaterally Heart: regular rate and rhythm, S1, S2 normal, no murmur, click, rub or gallop Extremities: no LEE Pulses: 2+ and symmetric Skin: warm and dry Neurologic: Grossly normal  EKG AV paced. 74 bpm  ASSESSMENT AND PLAN:    1. Chronic Systolic HF: volume currently appears stable. Patient notes his weight at home has been stable at 193 lb over the last week. No LEE. He denies resting, exertional dyspnea, orthopnea and PND. Continue daily lasix + daily weights. He is to increase lasix to BID when more than 3 lb weight gain in 24 hrs. Avoid sodium. Continue lisinopril, spironolactone, metoprolol and Imdur.   2. CAD: h/o noncritical CAD by cath performed in 2003. His last Myoview performed December 08, 2009, revealed inferior scar. He  denies any recent anginal symptoms.   3. POVD: bilateral carotid artery disease, renal artery stenosis and LE PVD, followed routinely by Dr. Allyson Sabal.   4. Nonischemic Cardiomyopathy: EF as low as 15-20%. He has an ICD followed by Dr. Johney Frame. Volume stable. He appears to be on both an ACE and ARB. Dr. Allyson Sabal prescribed his lisinopril. Unknown who started him on valsartan. We will discontinue valsartan. Continue BB, ACE-I and nitrate. Can add hydralazine or increase lisinopril if need for additional BP control.  5. HTN: BP is currently well controlled on current regimen. However patient was on both  lisinopril and valsartan (recent BMP showed increase in SCr to 2.11. We will stop his valsartan and will have patient f/u in 1 week for repeat BP check. Can further increase lisinopril if needed or add hydralazine for BP given systolic HF.   6. HLD: treated with Lipitor.   PLAN  F/u in 1 week for repeat BP check and f/u BMP.   Robbie Lis PA-C 07/29/2015 10:34 AM

## 2015-07-29 NOTE — Telephone Encounter (Signed)
Clearance placed in MR nurse fax to be faxed to [facility]

## 2015-08-07 ENCOUNTER — Other Ambulatory Visit (INDEPENDENT_AMBULATORY_CARE_PROVIDER_SITE_OTHER): Payer: Medicare Other | Admitting: *Deleted

## 2015-08-07 ENCOUNTER — Ambulatory Visit (INDEPENDENT_AMBULATORY_CARE_PROVIDER_SITE_OTHER): Payer: Medicare Other | Admitting: Pharmacist

## 2015-08-07 VITALS — BP 122/64 | HR 66

## 2015-08-07 DIAGNOSIS — I10 Essential (primary) hypertension: Secondary | ICD-10-CM

## 2015-08-07 DIAGNOSIS — I5042 Chronic combined systolic (congestive) and diastolic (congestive) heart failure: Secondary | ICD-10-CM

## 2015-08-07 LAB — BASIC METABOLIC PANEL
BUN: 36 mg/dL — AB (ref 7–25)
CHLORIDE: 104 mmol/L (ref 98–110)
CO2: 26 mmol/L (ref 20–31)
CREATININE: 1.86 mg/dL — AB (ref 0.70–1.11)
Calcium: 9.4 mg/dL (ref 8.6–10.3)
GLUCOSE: 94 mg/dL (ref 65–99)
Potassium: 4.4 mmol/L (ref 3.5–5.3)
Sodium: 140 mmol/L (ref 135–146)

## 2015-08-07 NOTE — Progress Notes (Signed)
Patient ID: Scott Parsons                 DOB: Sep 01, 1929, 82 yo                         MRN: [id number]     HPI: Scott Parsons is a 82 y.o. male referred by Scott Jester, PA to HTN clinic. PMH is significant for CAD, niCMP with EF as low as 15-20%, s/p AICD, PVD, renal stenting, HTN, and HLD. He was seen by Scott Parsons last week and was taking an ACEi and ARB at that time. BP has historically been well controlled. She discontinued his valsartan and pt presents today for BP follow up and BMET check.  Pt reports that he feels fine and is tolerating his medications well. He does have occasional dizziness when he stands up, but makes sure that he takes his time and has something to hold on to. This has been unchanged since stopping valsartan.  Vitals in clinic: BP 122/64, HR 66  Current HTN meds: lisinopril 21m daily, Toprol 157mdaily, spironolactone 12.65m54maily, Lasix 4m90mily BP goal: <150/90mm58mFamily History: Mother had Alzheimer's disease, father had heart disease, and paternal grandfather had heart disease,  Social History: Former smoker for 52 years. Quit in 2006. Reports that he does not drink alcohol or use illicit drugs.  Wt Readings from Last 3 Encounters:  07/29/15 198 lb 6.4 oz (89.994 kg)  11/26/14 194 lb (87.998 kg)  09/26/14 199 lb 6.4 oz (90.447 kg)   BP Readings from Last 3 Encounters:  07/29/15 110/50  11/26/14 122/56  09/26/14 112/60   Pulse Readings from Last 3 Encounters:  07/29/15 74  09/26/14 62  06/18/14 68    Renal function: CrCl cannot be calculated (Patient has no serum creatinine result on file.).  Past Medical History  Diagnosis Date  . CAD (coronary artery disease)   . Hypertension   . Hyperlipidemia   . History of stress test 12/08/2009    revealed inferior scar.  . Hx Marland Kitchenf Doppler ultrasound     reveal stable internal carotid artery stenosis left greater than right, as well as patent iliac stents with probably mild to moderate  in-stent restenosis with ABIs of 0.89 on the right and 0.79 on the left.  . Hx Marland Kitchenf echocardiogram 01/18/2005    EF 35-45% with mild MR and TR and his last functional study performed 8/05 revealed inferior apical and lateral scar with LV dilation.  . OSA (obstructive sleep apnea)     On C-pap  . PVD (peripheral vascular disease) (HCC) [place] with right external iliac artery angioplasty and left renal artery angioplasty and stenting.  . Renal artery stenosis (HCC) [place] History of renal stent   . History of stroke   . Carotid artery disease (HCC) [place] Nonischemic cardiomyopathy (HCC) [place] Chronic systolic dysfunction of left ventricle     EF 25% by echo 9/14  . LBBB (left bundle branch block)   . CHF (congestive heart failure) (HCC) [place] TIA (transient ischemic attack) 06/2010  . Pre-diabetes   . ICD (implantable cardioverter-defibrillator), dual, in situ     Current Outpatient Prescriptions on File Prior to Visit  Medication Sig Dispense Refill  . allopurinol (ZYLOPRIM) 100 MG tablet Take 100 mg by mouth daily.    . aspMarland Kitchenrin EC 81 MG tablet Take 81 mg by mouth daily.    .[name]  atorvastatin (LIPITOR) 20 MG tablet Take 20 mg by mouth daily.    . Blood Glucose Monitoring Suppl (FIFTY50 GLUCOSE METER 2.0) w/Device KIT     . clopidogrel (PLAVIX) 75 MG tablet Take 75 mg by mouth daily.    . Coenzyme Q10 (CO Q 10) 100 MG CAPS Take 100 mg by mouth daily.    . furosemide (LASIX) 40 MG tablet Take 1 tablet (40 mg total) by mouth daily. 30 tablet 6  . gabapentin (NEURONTIN) 300 MG capsule TAKE 1-3 CAPSULES BY MOUTH AT BEDTIME  10  . isosorbide mononitrate (IMDUR) 30 MG 24 hr tablet Take 30 mg by mouth daily.    [name] lisinopril (PRINIVIL,ZESTRIL) 40 MG tablet Take 0.5 tablets (20 mg total) by mouth daily. 30 tablet 11  . Magnesium 500 MG CAPS Take 500 mg by mouth daily.     . metoprolol (TOPROL-XL) 200 MG 24 hr tablet Take 0.5 tablets by mouth daily. Takes along with  Toprol XL 100 mg tablet (1/2)  11  .  metoprolol succinate (TOPROL-XL) 100 MG 24 hr tablet Take 50 mg by mouth daily. Takes with 134m to equal 1561mdaily    . metoprolol succinate (TOPROL-XL) 100 MG 24 hr tablet Take 1 tablet (100 mg total) by mouth daily. Take with 5047mablet to equal 150m41m0 tablet 7  . niacin 500 MG tablet Take 500 mg by mouth daily with breakfast.    . NITROSTAT 0.4 MG SL tablet Place 0.4 mg under the tongue every 5 (five) minutes as needed for chest pain (MAX 3 TABLETS).     . NON FORMULARY Wears O2 at night (2 L)    . NON FORMULARY Use as directed - Neoteric diabetic skincare    . PROAIR HFA 108 (90 BASE) MCG/ACT inhaler Inhale 2 puffs into the lungs every 12 (twelve) hours as needed for wheezing.   3  . spironolactone (ALDACTONE) 25 MG tablet Take 12.5 mg by mouth daily.    . zoMarland Kitchenpidem (AMBIEN) 10 MG tablet Take 5 mg by mouth at bedtime.      No current facility-administered medications on file prior to visit.    Allergies  Allergen Reactions  . Amoxicillin Itching    Itching    . Biaxin [Clarithromycin] Itching    Lip swelling     . Erythromycin Itching  . Penicillins Itching  . Shellfish Allergy Itching           Assessment/Plan:  1. Hypertension -  BP today of 122/64 at goal <150/90mm34mBP have historically been controlled with systolic readings ranging 110s-120s. Pt to continue on ACEi, BB, and spironolactone for HF and BP control. ARB was discontinued last week since pt already taking ACEi - checking BMET today since SCr had been rising. No further medication changes made today. Pt has follow up with Scott Parsons in 2 months.   Scott Parsons, PharmD, CPP [place] [id number]: [phone]: [phone] 9:53 AM

## 2015-08-08 ENCOUNTER — Telehealth: Payer: Self-pay

## 2015-08-08 NOTE — Telephone Encounter (Signed)
Received message to call patient regarding ICM transmission.    Spoke with patient regarding ICM sent 08/05/2015.  Advised since he was checked on 07/21/2015, the next appointment would be 08/25/2015.  Advised the fluid levels are trending along reference line on 08/05/2015.  He stated he is doing fine.  Letter sent with next transmission date.    ICM Trend: 08/05/2015

## 2015-08-25 ENCOUNTER — Ambulatory Visit (INDEPENDENT_AMBULATORY_CARE_PROVIDER_SITE_OTHER): Payer: Medicare Other | Admitting: *Deleted

## 2015-08-25 DIAGNOSIS — I429 Cardiomyopathy, unspecified: Secondary | ICD-10-CM

## 2015-08-25 DIAGNOSIS — Z95810 Presence of automatic (implantable) cardiac defibrillator: Secondary | ICD-10-CM | POA: Diagnosis not present

## 2015-08-25 DIAGNOSIS — I428 Other cardiomyopathies: Secondary | ICD-10-CM

## 2015-08-25 NOTE — Progress Notes (Signed)
Remote ICD transmission.   

## 2015-08-26 NOTE — Progress Notes (Signed)
EPIC Encounter for ICM Monitoring  Patient Name: Scott Parsons is a 82 y.o. male Date: 08/26/2015 Primary Care Physican: [name], MD Primary Cardiologist: Allyson Sabal Electrophysiologist: Allred Dry Weight: 195 lbs   Bi-V Pacing 97.5%      In the past month, have you:  1. Gained more than 2 pounds in a day or more than 5 pounds in a week? Yes, 3 lbs within a week  2. Had changes in your medications (with verification of current medications)? Losartan discontinued by Boyce Medici PA 07/29/2015  3. Had more shortness of breath than is usual for you? no  4. Limited your activity because of shortness of breath? no  5. Not been able to sleep because of shortness of breath? no  6. Had increased swelling in your feet or ankles? no  7. Had symptoms of dehydration (dizziness, dry mouth, increased thirst, decreased urine output) no  8. Had changes in sodium restriction? no  9. Been compliant with medication? Yes   ICM trend: 3 month view for 08/26/2015   ICM trend: 1 year view for 08/26/2015   Follow-up plan: ICM clinic phone appointment on 10/31/2015 and has office appointment with Gypsy Balsam, NP on 09/30/2015.  Thoracic impedance below reference line from 08/11/2015 to 08/23/2015 suggesting fluid accumulation.  Patient had symptom of weight gain. He reported he took extra Furosemide as directed by Boyce Medici, PA for weight gain.  Thoracic impedance back to reference line on 08/24/2015.  Reminded him to limit sodium intake to < 2000 mg and fluid intake to 64 oz daily.  Encouraged to call for any fluid symptoms.  No changes today.     Karie Soda, RN, CCM 08/26/2015 9:43 AM

## 2015-09-29 NOTE — Progress Notes (Signed)
This encounter was created in error - please disregard.

## 2015-09-30 ENCOUNTER — Encounter: Payer: Medicare Other | Admitting: Nurse Practitioner

## 2015-10-02 ENCOUNTER — Encounter: Payer: Self-pay | Admitting: Cardiology

## 2015-10-02 LAB — CUP PACEART REMOTE DEVICE CHECK
Battery Voltage: 2.98 V
Brady Statistic AS VP Percent: 27.98 %
Brady Statistic RA Percent Paced: 71.05 %
Brady Statistic RV Percent Paced: 44.88 %
HIGH POWER IMPEDANCE MEASURED VALUE: 61 Ohm
Implantable Lead Implant Date: 20150507
Implantable Lead Implant Date: 20150507
Implantable Lead Location: 753859
Implantable Lead Model: 5076
Implantable Lead Model: 6935
Lead Channel Impedance Value: 342 Ohm
Lead Channel Impedance Value: 361 Ohm
Lead Channel Impedance Value: 361 Ohm
Lead Channel Impedance Value: 399 Ohm
Lead Channel Impedance Value: 513 Ohm
Lead Channel Impedance Value: 551 Ohm
Lead Channel Impedance Value: 551 Ohm
Lead Channel Impedance Value: 646 Ohm
Lead Channel Pacing Threshold Amplitude: 0.5 V
Lead Channel Pacing Threshold Pulse Width: 0.4 ms
Lead Channel Pacing Threshold Pulse Width: 0.4 ms
Lead Channel Sensing Intrinsic Amplitude: 19.75 mV
Lead Channel Sensing Intrinsic Amplitude: 19.75 mV
Lead Channel Setting Pacing Amplitude: 2 V
Lead Channel Setting Pacing Amplitude: 2.5 V
Lead Channel Setting Pacing Pulse Width: 0.4 ms
Lead Channel Setting Sensing Sensitivity: 0.3 mV
MDC IDC LEAD IMPLANT DT: 20150507
MDC IDC LEAD LOCATION: 753858
MDC IDC LEAD LOCATION: 753860
MDC IDC LEAD MODEL: 4598
MDC IDC MSMT BATTERY REMAINING LONGEVITY: 84 mo
MDC IDC MSMT LEADCHNL LV IMPEDANCE VALUE: 342 Ohm
MDC IDC MSMT LEADCHNL LV IMPEDANCE VALUE: 437 Ohm
MDC IDC MSMT LEADCHNL LV IMPEDANCE VALUE: 589 Ohm
MDC IDC MSMT LEADCHNL LV IMPEDANCE VALUE: 627 Ohm
MDC IDC MSMT LEADCHNL LV PACING THRESHOLD AMPLITUDE: 1 V
MDC IDC MSMT LEADCHNL RA PACING THRESHOLD AMPLITUDE: 0.625 V
MDC IDC MSMT LEADCHNL RA PACING THRESHOLD PULSEWIDTH: 0.4 ms
MDC IDC MSMT LEADCHNL RA SENSING INTR AMPL: 1.625 mV
MDC IDC MSMT LEADCHNL RA SENSING INTR AMPL: 1.625 mV
MDC IDC MSMT LEADCHNL RV IMPEDANCE VALUE: 437 Ohm
MDC IDC SESS DTM: 20170417152108
MDC IDC SET LEADCHNL RA PACING AMPLITUDE: 2 V
MDC IDC SET LEADCHNL RV PACING PULSEWIDTH: 0.4 ms
MDC IDC STAT BRADY AP VP PERCENT: 69.87 %
MDC IDC STAT BRADY AP VS PERCENT: 1.18 %
MDC IDC STAT BRADY AS VS PERCENT: 0.96 %

## 2015-10-06 NOTE — Progress Notes (Signed)
Cardiology Office Note Date:  10/07/2015  Patient ID:  Orlin, Kann 02-10-1930, MRN [id number] PCP:  [name], MD  Cardiologist:  Dr. Gwenlyn Found Electrophysiologist: Dr. [name]   Chief Complaint: routine device visit, BP check  History of Present Illness: SINA LUCCHESI is a 82 y.o. male with history of PVD carotid disease monitored by Dr. Gwenlyn Found and RAS with stent on left on 2007 and b/l iliac artery stents, NOD coronaries by cath in 2003, HTN, HLD (followed by his PMD), CRI stage IV also managed with his PMD, and NICM with CRT-D.  He comes today seen for Dr. [name] feeling well, denies any CP, palpitations or SOB, no dizziness, near syncope or syncope, no PND/orthopnea.  No shocks from his device  He was recently treated with an antibiotic for a wound on his arm, developed diarrhea and per the patient his PMD noted worsening renal function and his K+ high, the abx was stopped and he had repeat labs yesterday the result not yet called to him.  Past Medical History  Diagnosis Date  . CAD (coronary artery disease)   . Hypertension   . Hyperlipidemia   . History of stress test 12/08/2009    revealed inferior scar.  [name] Hx of Doppler ultrasound     reveal stable internal carotid artery stenosis left greater than right, as well as patent iliac stents with probably mild to moderate in-stent restenosis with ABIs of 0.89 on the right and 0.79 on the left.  [name] Hx of echocardiogram 01/18/2005    EF 35-45% with mild MR and TR and his last functional study performed 8/05 revealed inferior apical and lateral scar with LV dilation.  . OSA (obstructive sleep apnea)     On C-pap  . PVD (peripheral vascular disease) ([place])     with right external iliac artery angioplasty and left renal artery angioplasty and stenting.  . Renal artery stenosis ([place])   . History of renal stent   . History of stroke   . Carotid artery disease ([place])   . Nonischemic cardiomyopathy ([place])   . Chronic systolic  dysfunction of left ventricle     EF 25% by echo 9/14  . LBBB (left bundle branch block)   . CHF (congestive heart failure) ([place])   . TIA (transient ischemic attack) 06/2010  . Pre-diabetes   . ICD (implantable cardioverter-defibrillator), dual, in situ     Past Surgical History  Procedure Laterality Date  . Cardiac catheterization  2003    Mild LV dysfuntion. he had apical and inferoapical wall motion abnormalities and EF of 45%.  . Cardiac catheterization  10/18/2005    STENTS: left renal artery as well as both iliac arteries. Was stented with 65m x 480mSmart Nitinol self-expanding stent replaced with a 23m16m 4mm1mlloon at nominal pressures, resulting in a reduction od 50% proximal right external iliac artery  stenosis to 0% residual.  . Hand surgery  01/1995  . Hernia repair    . Bi-ventricular implantable cardioverter defibrillator  (crt-d)  09-13-2013    MDT CRTD implanted by Dr AllrRayann HemanBi-ventricular implantable cardioverter defibrillator N/A 09/13/2013    Procedure: BI-VENTRICULAR IMPLANTABLE CARDIOVERTER DEFIBRILLATOR  (CRT-D);  Surgeon: JameCoralyn Mark;  [facility];  Service: Cardiovascular;  Laterality: N/A;    Current Outpatient Prescriptions  Medication Sig Dispense Refill  . allopurinol (ZYLOPRIM) 100 MG tablet Take 100 mg by mouth daily.    . asMarland Kitchenirin EC 81 MG tablet Take  81 mg by mouth daily.    [name] atorvastatin (LIPITOR) 20 MG tablet Take 20 mg by mouth daily.    . Blood Glucose Monitoring Suppl (FIFTY50 GLUCOSE METER 2.0) w/Device KIT     . clopidogrel (PLAVIX) 75 MG tablet Take 75 mg by mouth daily.    . Coenzyme Q10 (CO Q 10) 100 MG CAPS Take 100 mg by mouth daily.    . furosemide (LASIX) 40 MG tablet Take 1 tablet (40 mg total) by mouth daily. 30 tablet 6  . gabapentin (NEURONTIN) 300 MG capsule TAKE 1-3 CAPSULES BY MOUTH AT BEDTIME  10  . isosorbide mononitrate (IMDUR) 30 MG 24 hr tablet Take 30 mg by mouth daily.    [name] lisinopril (PRINIVIL,ZESTRIL) 40  MG tablet Take 0.5 tablets (20 mg total) by mouth daily. 30 tablet 11  . Magnesium 500 MG CAPS Take 500 mg by mouth daily.     . niacin 500 MG tablet Take 500 mg by mouth daily with breakfast.    . NITROSTAT 0.4 MG SL tablet Place 0.4 mg under the tongue every 5 (five) minutes as needed for chest pain (MAX 3 TABLETS).     . NON FORMULARY Wears O2 at night (2 L)    . NON FORMULARY Use as directed - Neoteric diabetic skincare    . PROAIR HFA 108 (90 BASE) MCG/ACT inhaler Inhale 2 puffs into the lungs every 12 (twelve) hours as needed for wheezing.   3  . spironolactone (ALDACTONE) 25 MG tablet Take 12.5 mg by mouth daily.    [name] zolpidem (AMBIEN) 10 MG tablet Take 5 mg by mouth at bedtime.     . metoprolol (TOPROL XL) 200 MG 24 hr tablet Take 1 tablet (200 mg total) by mouth daily. 30 tablet 6   No current facility-administered medications for this visit.    Allergies:   Amoxicillin; Biaxin; Erythromycin; Penicillins; and Shellfish allergy   Social History:  The patient  reports that he quit smoking about 11 years ago. His smoking use included Cigarettes. He quit after 52 years of use. He has never used smokeless tobacco. He reports that he does not drink alcohol or use illicit drugs.   Family History:  The patient's family history includes Alzheimer's disease in his mother; Heart attack in his paternal grandfather; Heart disease in his father and paternal grandfather.  ROS:  Please see the history of present illness.    All other systems are reviewed and otherwise negative.   PHYSICAL EXAM:  VS:  BP 132/64 mmHg  Pulse 58  Ht '6\' 2"'$  (1.88 m)  Wt 196 lb (88.905 kg)  BMI 25.15 kg/m2 BMI: Body mass index is 25.15 kg/(m^2). Well nourished, well developed, in no acute distress HEENT: normocephalic, atraumatic Neck: no JVD, carotid bruits or masses Cardiac:  normal S1, S2; RRR; no significant murmurs, no rubs, or gallops Lungs:  clear to auscultation bilaterally, no wheezing, rhonchi or  rales Abd: soft, nontender MS: no deformity or atrophy Ext: trace if any edema noted Skin: warm and dry, no rash Neuro:  No gross deficits appreciated Psych: euthymic mood, full affect  ICD site is stable, no tethering or discomfort   EKG:  07/29/15: SR/pacing DEVICE check today: battery status is good, device with normal function, see pace art, he had brief NSVT x2 noted and very frequent PVC's, increased to 152/hr (up from last check), 91% BiVe pacing.  05/07/14: Echocardiogram Study Conclusions - Left ventricle: The cavity size was mildly dilated. Wall  thickness was increased in a pattern of mild LVH. Systolic function was severely reduced. The estimated ejection fraction was in the range of 20% to 25%. Severe hypokinesis of the entire myocardium. Doppler parameters are consistent with abnormal left ventricular relaxation (grade 1 diastolic dysfunction). - Mitral valve: There was mild regurgitation. - Left atrium: The atrium was moderately dilated  Recent Labs: 07/11/2015: B Natriuretic Peptide 240.2* 08/07/2015: BUN 36*; Creat 1.86*; Potassium 4.4; Sodium 140  No results found for requested labs within last 365 days.    Wt Readings from Last 3 Encounters:  10/07/15 196 lb (88.905 kg)  07/29/15 198 lb 6.4 oz (89.994 kg)  11/26/14 194 lb (87.998 kg)     Other studies reviewed: Additional studies/records reviewed today include: summarized above  DEVICE information: MDT, CRT-D, implanted May 2015, Dr. [name]  ASSESSMENT AND PLAN:  1. NICM, CRT-D     exam is compensated     device functioning as progreammed     no therepies     Follows with ICM      On BB/ACE     He is having frequent PVC's only 91% BiVe pacing   2. PVD     On statin, ASA, plavix     Follows with Dr. Gwenlyn Found  3. HTN     stable  4. HLD     On statin  5. CRI      Last labs look at baseline  Disposition: Increase his Toprol to '200mg'$  daily in effort to reduce his PVC's and allow better  BiVe pacing, we will request his labs from yesterday from his PMD office and check a mag level today,  F/u with ICM, [facility] remotes, Dr. Gwenlyn Found as directed by him, Dr. [name] in 71mo sooner if needed.  Current medicines are reviewed at length with the patient today.  The patient did not have any concerns regarding medicines  Signed, RJennings Books PA-C 10/07/2015 12:20 PM     [place] [phone])  [phone])

## 2015-10-07 ENCOUNTER — Encounter: Payer: Self-pay | Admitting: Physician Assistant

## 2015-10-07 ENCOUNTER — Ambulatory Visit (INDEPENDENT_AMBULATORY_CARE_PROVIDER_SITE_OTHER): Payer: Medicare Other | Admitting: Physician Assistant

## 2015-10-07 VITALS — BP 132/64 | HR 58 | Ht 74.0 in | Wt 196.0 lb

## 2015-10-07 DIAGNOSIS — I429 Cardiomyopathy, unspecified: Secondary | ICD-10-CM | POA: Diagnosis not present

## 2015-10-07 DIAGNOSIS — N184 Chronic kidney disease, stage 4 (severe): Secondary | ICD-10-CM | POA: Diagnosis not present

## 2015-10-07 DIAGNOSIS — I10 Essential (primary) hypertension: Secondary | ICD-10-CM

## 2015-10-07 DIAGNOSIS — I739 Peripheral vascular disease, unspecified: Secondary | ICD-10-CM

## 2015-10-07 DIAGNOSIS — E785 Hyperlipidemia, unspecified: Secondary | ICD-10-CM | POA: Diagnosis not present

## 2015-10-07 LAB — MAGNESIUM: MAGNESIUM: 2 mg/dL (ref 1.5–2.5)

## 2015-10-07 MED ORDER — METOPROLOL SUCCINATE ER 200 MG PO TB24: 200.0000 mg | ORAL_TABLET | Freq: Every day | ORAL | Status: DC

## 2015-10-07 NOTE — Patient Instructions (Signed)
Medication Instructions:   START TAKING METOPROLOL 200 MG ONCE A DAY   If you need a refill on your cardiac medications before your next appointment, please call your pharmacy.  Labwork:  MAG TODAY    Testing/Procedures: NONE ORDER TODAY    Follow-Up: Your physician wants you to follow-up in:  IN  6  MONTHS WITH DR  Johney Frame  You will receive a reminder letter in the mail two months in advance. If you don't receive a letter, please call our office to schedule the follow-up appointment.      Any Other Special Instructions Will Be Listed Below (If Applicable).

## 2015-10-09 ENCOUNTER — Telehealth: Payer: Self-pay | Admitting: *Deleted

## 2015-10-09 NOTE — Telephone Encounter (Signed)
-----   Message from [facility], [place] sent at 10/08/2015  7:34 AM EDT ----- Please let the patient know his magnesium level is good.  (Re-request his labs done at his PMD office recently please).  Thanks [employer]

## 2015-10-10 ENCOUNTER — Telehealth: Payer: Self-pay

## 2015-10-10 ENCOUNTER — Telehealth: Payer: Self-pay | Admitting: Cardiovascular Disease

## 2015-10-10 DIAGNOSIS — I6523 Occlusion and stenosis of bilateral carotid arteries: Secondary | ICD-10-CM

## 2015-10-10 DIAGNOSIS — Z8673 Personal history of transient ischemic attack (TIA), and cerebral infarction without residual deficits: Secondary | ICD-10-CM

## 2015-10-10 NOTE — Telephone Encounter (Signed)
rec'd call from Stanton Kidney, nurse at [facility].  Reported the pt. Went to a hospital in [place] last night, due to a TIA.  Stated that the MD in the ER contacted Dr. Imogene Burn due to > 70 % carotid stenosis.  Questioned what the next step is in this pt's treatment.  Spoke with Dr. Imogene Burn.  Advised to schedule pt. With any MD who has 1st available opening, with a bilateral carotid duplex.  Returned call to Stanton Kidney; informed the pt. Will be contacted by our office for an appt.

## 2015-10-10 NOTE — Telephone Encounter (Signed)
New message   Pt is calling to inform Dr.Berry that he has a TIA and it is getting worse and  he wants the RN or Dr.berry to call him because he has a block that is more then 70% blocked

## 2015-10-10 NOTE — Telephone Encounter (Signed)
Spoke to pt to sch appt 11/12/15; lab at 8:30 and MD at 9:15.

## 2015-10-10 NOTE — Telephone Encounter (Signed)
Spoke with pt, he was seen in [place] yeaterday and the final word was he had a TIA. He reports they told him his carotid arteries were worse and he need surgery. According to the pt the ER doctor talked with dr Imogene Burn at [facility] and they recommend the pt have surgery. The pt was told this need to take place quickly. The pt wanted to know dr berry thoughts, advised dr berry is out until Tuesday next week. I called dr Nicky Pugh office to find out what was discussed to help the pt figure out the next step.

## 2015-10-10 NOTE — Telephone Encounter (Signed)
Spoke with pt, dr Nicky Pugh office plans on giving him a call to schedule a follow up appt. Will make dr berry aware of what is going on.

## 2015-10-12 DIAGNOSIS — G451 Carotid artery syndrome (hemispheric): Secondary | ICD-10-CM | POA: Insufficient documentation

## 2015-10-13 ENCOUNTER — Telehealth: Payer: Self-pay | Admitting: Cardiovascular Disease

## 2015-10-13 NOTE — Telephone Encounter (Signed)
New message   Pt daughter is calling for an appt this week for her father for hospital follow up  No PA

## 2015-10-13 NOTE — Telephone Encounter (Signed)
Pt's carotid dopplers haven't been that bad in the past. I have been following them as an OP. He would be high risk for CEA given degree of LV dysfunction and prob a better CAS candidate  JJB

## 2015-10-13 NOTE — Telephone Encounter (Signed)
Called and spoke to patient. He would like to be seen by Dr. Allyson Sabal for a hosp f/u. Was seen in [facility] hospital in [place] for a TIA. Was recommended to follow up w/ Dr. Allyson Sabal and have a repeat carotid US for eval of stenosis. ER physician who evaluated was concerned about disease progression.  He currently has a carotid US scheduled in 1 month but also wants to see if this can be moved up. Will see if possible to schedule carotid for same day as Dr. Allyson Sabal appt. Have him sched to see Dr. Allyson Sabal on 6/14 at 8:30am.  Msg sent to scheduling to coordinate carotid US here if possible.

## 2015-10-15 NOTE — Telephone Encounter (Signed)
Patient has office visit and carotid dopplers scheduled on 10/22/15.

## 2015-10-15 NOTE — Telephone Encounter (Signed)
Carotid dopplers have been rescheduled for 10/22/15 after office visit appt. No further encounter needed at this time.

## 2015-10-20 ENCOUNTER — Telehealth: Payer: Self-pay | Admitting: *Deleted

## 2015-10-20 ENCOUNTER — Encounter: Payer: Self-pay | Admitting: Pharmacist

## 2015-10-20 NOTE — Telephone Encounter (Signed)
-----   Message from Renee Lynn Ursuy, PA-C sent at 10/08/2015  7:34 AM EDT ----- Please let the patient know his magnesium level is good.  (Re-request his labs done at his PMD office recently please).  Thanks Renee 

## 2015-10-21 ENCOUNTER — Telehealth: Payer: Self-pay | Admitting: Cardiovascular Disease

## 2015-10-21 NOTE — Telephone Encounter (Signed)
Received records from [facility] for appointment with Dr Allyson Sabal 10/22/15.  Records given to [facility] (medical records) for Dr Hazle Coca schedule on 10/22/15. lp

## 2015-10-22 ENCOUNTER — Ambulatory Visit (INDEPENDENT_AMBULATORY_CARE_PROVIDER_SITE_OTHER): Payer: Medicare Other | Admitting: Cardiovascular Disease

## 2015-10-22 ENCOUNTER — Ambulatory Visit (HOSPITAL_COMMUNITY)
Admission: RE | Admit: 2015-10-22 | Discharge: 2015-10-22 | Disposition: A | Discharge status: Home / Self Care | Payer: Medicare Other | Source: Ambulatory Visit | Attending: Cardiology | Admitting: Cardiology

## 2015-10-22 ENCOUNTER — Encounter: Payer: Self-pay | Admitting: Cardiovascular Disease

## 2015-10-22 VITALS — BP 130/64 | HR 61 | Ht 74.0 in | Wt 201.4 lb

## 2015-10-22 DIAGNOSIS — I10 Essential (primary) hypertension: Secondary | ICD-10-CM

## 2015-10-22 DIAGNOSIS — I701 Atherosclerosis of renal artery: Secondary | ICD-10-CM | POA: Diagnosis not present

## 2015-10-22 DIAGNOSIS — I739 Peripheral vascular disease, unspecified: Secondary | ICD-10-CM | POA: Diagnosis not present

## 2015-10-22 DIAGNOSIS — I779 Disorder of arteries and arterioles, unspecified: Secondary | ICD-10-CM | POA: Diagnosis not present

## 2015-10-22 DIAGNOSIS — E785 Hyperlipidemia, unspecified: Secondary | ICD-10-CM

## 2015-10-22 NOTE — Assessment & Plan Note (Signed)
History of hyperlipidemia on statin therapy followed by his PCP 

## 2015-10-22 NOTE — Progress Notes (Signed)
10/22/2015 Scott Parsons   1929-09-04  [id number]  Primary Physician [name], MD Primary Cardiologist: [name] MD [name]  HPI:  The patient is a very pleasant 82 year old, mildly overweight, married Caucasian male, father of 58, grandfather to 7 grandchildren who I saw in the office 11/26/14. He has a history of noncritical CAD by cath which I performed in 2003 with mild LV dysfunction. At that time, he had apical and inferoapical wall motion abnormalities and EF of 45%. He has PVOD with bilateral carotid disease left greater than right which we follow by duplex ultrasound. He is neurologically asymptomatic. I stented his left renal artery October 18, 2005, as well as both iliac arteries. He does have moderate SFA disease bilaterally. He does complain of some hip pain when walking on a treadmill or walking up an incline, but rides a bike without limitation. His other problems include hypertension and hyperlipidemia. His last Myoview performed December 08, 2009, revealed inferior scar. Dr. Heber Richfield follows his lipid profile. His most recent Dopplers reveal stable internal carotid artery stenosis left greater than right, as well as patent iliac stents with probably mild to moderate in-stent restenosis with ABIs of 0.89 on the right and 0.79 on the left.  Saw him last a/2/13 he has had admissions for congestive heart failure at [facility] in March of this year probably related to dietary indiscretion. He spent today's in the hospital I was direst. He had recent renal Doppler studies that showed progression of disease on left side suggesting "in-stent restenosis. He denies chest pain or shortness of breath now. Does have lifestyle limiting claudication. His last lower extremity arterial Doppler studies were performed 01/25/13. He also has moderate asymmetric left internal carotid artery stenosis by 2+ ultrasound performed 11/08/11. Because his echocardiogram performed 09/06/13  revealed an ejection fraction of 15 and 20% he underwent biventricular ICD implantation by Dr. [name] 09/13/13 with a CRT device.he clinically improved after that although his ejection fraction by 2-D echo in December was 20-25%.e apparently also a solid [facility] congestive heart failure clinic and was recently transitioned to [place]. He was seen at [facility] for a TIA type episode on 10/09/2015 with transient left-sided weakness and numbness. Carotid Dopplers performed there did apparently show some progression of his left internal carotid artery stenosis over this did not appear to be in the critical range.   Current Outpatient Prescriptions  Medication Sig Dispense Refill  . allopurinol (ZYLOPRIM) 100 MG tablet Take 100 mg by mouth daily.    [name] aspirin EC 81 MG tablet Take 81 mg by mouth daily.    [name] atorvastatin (LIPITOR) 20 MG tablet Take 20 mg by mouth daily.    . Blood Glucose Monitoring Suppl (FIFTY50 GLUCOSE METER 2.0) w/Device KIT     . clopidogrel (PLAVIX) 75 MG tablet Take 75 mg by mouth daily.    . Coenzyme Q10 (CO Q 10) 100 MG CAPS Take 100 mg by mouth daily.    . furosemide (LASIX) 40 MG tablet Take 1 tablet (40 mg total) by mouth daily. 30 tablet 6  . gabapentin (NEURONTIN) 300 MG capsule TAKE 1-3 CAPSULES BY MOUTH AT BEDTIME  10  . isosorbide mononitrate (IMDUR) 30 MG 24 hr tablet Take 30 mg by mouth daily.    . Magnesium 250 MG TABS Take 2 tablets by mouth daily.    . metoprolol succinate (TOPROL-XL) 100 MG 24 hr tablet Take 200 mg by mouth daily. Take with  or immediately following a meal.    . niacin 500 MG tablet Take 500 mg by mouth daily with breakfast.    . NITROSTAT 0.4 MG SL tablet Place 0.4 mg under the tongue every 5 (five) minutes as needed for chest pain (MAX 3 TABLETS).     . NON FORMULARY Wears O2 at night (2 L)    . NON FORMULARY Use as directed - [employer] diabetic skincare    . sacubitril-valsartan (ENTRESTO) 24-26 MG Take 1 tablet by mouth daily.    [name]  zolpidem (AMBIEN) 10 MG tablet Take 5 mg by mouth at bedtime.      No current facility-administered medications for this visit.    Allergies  Allergen Reactions  . Amoxicillin Itching    Itching    . Biaxin [Clarithromycin] Itching    Lip swelling     . Erythromycin Itching  . Penicillins Itching  . Shellfish Allergy Itching          Social History   Social History  . Marital Status: Married    Spouse Name: N/A  . Number of Children: N/A  . Years of Education: N/A   Occupational History  . Not on file.   Social History Main Topics  . Smoking status: Former Smoker -- 87 years    Types: Cigarettes    Quit date: 08/11/2004  . Smokeless tobacco: Never Used  . Alcohol Use: No  . Drug Use: No  . Sexual Activity: Not on file   Other Topics Concern  . Not on file   Social History Narrative   Retired     Review of Systems: General: negative for chills, fever, night sweats or weight changes.  Cardiovascular: negative for chest pain, dyspnea on exertion, edema, orthopnea, palpitations, paroxysmal nocturnal dyspnea or shortness of breath Dermatological: negative for rash Respiratory: negative for cough or wheezing Urologic: negative for hematuria Abdominal: negative for nausea, vomiting, diarrhea, bright red blood per rectum, melena, or hematemesis Neurologic: negative for visual changes, syncope, or dizziness All other systems reviewed and are otherwise negative except as noted above.    Blood pressure 130/64, pulse 61, height _0  (1.88 m), weight 201 lb 6.4 oz (91.354 kg).  General appearance: alert and no distress Neck: no adenopathy, no JVD, supple, symmetrical, trachea midline, thyroid not enlarged, symmetric, no tenderness/mass/nodules and ight carotid bruit Lungs: clear to auscultation bilaterally Heart: regular rate and rhythm, S1, S2 normal, no murmur, click, rub or gallop Extremities: extremities normal, atraumatic, no cyanosis or edema  EKG not  performed today  ASSESSMENT AND PLAN:   Chronic combined systolic and diastolic congestive heart failure, NYHA class 2 History of noncritical CAD by cath in 2003 with severe LV dysfunction thought to be nonischemic. His EF is in the 20-25% range. He did have a biventricular ICD implanted by Dr. [name] 09/13/13 with CRT. He clinically improved although his ejection fraction did not change much. He is followed by [facility] heart failure clinic as well and was recently transitioned to low-dose Entresto . i'm going to have him see Erasmo Downer back for titration of this medication.  Essential hypertension History of hypertension and blood pressure measured today at 130/64. He is on metoprolol and Entresto . Continue current meds at current dosing  Hyperlipidemia History of hyperlipidemia on statin therapy followed by his PCP  Carotid artery disease History of carotid artery disease with Dopplers performed 05/08/15 showing mild to moderate right and left ICA stenosis. He recently had a TIA type episode with  left-sided weakness and numbness and was seen at [facility] and evaluated by physicians from [facility]. His carotid Dopplers performed on 10/09/15 did show some progression of disease on the left with some acoustic shadowing. We will recheck these in 6 months. Should he require revascularization he would be a carotid stent candidate given his severe LV dysfunction.  Peripheral arterial disease History of peripheral arterial disease status post bilateral iliac stenting by myself 10/18/05. He does have moderate SFA disease bilaterally as well. He's noticed increasing claudication over the last several months. We will recheck lower extremity arterial Dopplers including aortoiliac and LEAs  Renal artery stenosis History of renal artery stenosis status postleft renal artery stenting by myself 10/18/05.      [name] MD FACP,FACC,FAHA, [facility] 10/22/2015 9:03 AM

## 2015-10-22 NOTE — Patient Instructions (Signed)
Medication Instructions:  Your physician recommends that you continue on your current medications as directed. Please refer to the Current Medication list given to you today.   Labwork: NONE  Testing/Procedures: Your physician has requested that you have a lower extremity arterial doppler- During this test, ultrasound is used to evaluate arterial blood flow in the legs. Allow approximately one hour for this exam.   Your physician has requested that you have a AORTA/ILIAC/IVC doppler- During this test, ultrasound is used to evaluate arterial blood flow in your arota and iliac arteries.   Your physician has requested that you have a carotid duplex. This test is an ultrasound of the carotid arteries in your neck. It looks at blood flow through these arteries that supply the brain with blood. Allow one hour for this exam. There are no restrictions or special instructions.    Follow-Up: Your physician wants you to follow-up in: 6 MONTHS WITH DR Allyson Sabal AFTER CAROTID DOPPLERS You will receive a reminder letter in the mail two months in advance. If you don't receive a letter, please call our office to schedule the follow-up appointment.  You have been referred to BLOOD PRESSURE CLINIC WITH PHARMACIST TO TITRATE YOUR ENTRESTO MEDICATION.   Any Other Special Instructions Will Be Listed Below (If Applicable).     If you need a refill on your cardiac medications before your next appointment, please call your pharmacy.

## 2015-10-22 NOTE — Assessment & Plan Note (Signed)
History of hypertension and blood pressure measured today at 130/64. He is on metoprolol and Entresto . Continue current meds at current dosing

## 2015-10-22 NOTE — Assessment & Plan Note (Signed)
History of noncritical CAD by cath in 2003 with severe LV dysfunction thought to be nonischemic. His EF is in the 20-25% range. He did have a biventricular ICD implanted by Dr. Johney Frame 09/13/13 with CRT. He clinically improved although his ejection fraction did not change much. He is followed by [facility] heart failure clinic as well and was recently transitioned to low-dose Entresto . i'm going to have him see Belenda Cruise back for titration of this medication.

## 2015-10-22 NOTE — Assessment & Plan Note (Signed)
History of renal artery stenosis status postleft renal artery stenting by myself 10/18/05.

## 2015-10-22 NOTE — Assessment & Plan Note (Signed)
History of peripheral arterial disease status post bilateral iliac stenting by myself 10/18/05. He does have moderate SFA disease bilaterally as well. He's noticed increasing claudication over the last several months. We will recheck lower extremity arterial Dopplers including aortoiliac and LEAs

## 2015-10-22 NOTE — Assessment & Plan Note (Signed)
History of carotid artery disease with Dopplers performed 05/08/15 showing mild to moderate right and left ICA stenosis. He recently had a TIA type episode with left-sided weakness and numbness and was seen at [facility] and evaluated by physicians from [facility]. His carotid Dopplers performed on 10/09/15 did show some progression of disease on the left with some acoustic shadowing. We will recheck these in 6 months. Should he require revascularization he would be a carotid stent candidate given his severe LV dysfunction.

## 2015-10-30 ENCOUNTER — Inpatient Hospital Stay (HOSPITAL_COMMUNITY): Admission: RE | Admit: 2015-10-30 | Payer: Medicare Other | Source: Ambulatory Visit

## 2015-10-31 ENCOUNTER — Telehealth: Payer: Self-pay

## 2015-10-31 ENCOUNTER — Ambulatory Visit (INDEPENDENT_AMBULATORY_CARE_PROVIDER_SITE_OTHER): Payer: Medicare Other

## 2015-10-31 DIAGNOSIS — Z95810 Presence of automatic (implantable) cardiac defibrillator: Secondary | ICD-10-CM

## 2015-10-31 DIAGNOSIS — I5042 Chronic combined systolic (congestive) and diastolic (congestive) heart failure: Secondary | ICD-10-CM | POA: Diagnosis not present

## 2015-10-31 NOTE — Telephone Encounter (Signed)
Remote ICM transmission received.  Attempted patient call and left message for return call.   

## 2015-10-31 NOTE — Progress Notes (Signed)
EPIC Encounter for ICM Monitoring  Patient Name: Scott Parsons is a 82 y.o. male Date: 10/31/2015 Primary Care Physican: [name], MD Primary Cardiologist: Allyson Sabal Electrophysiologist: Allred Dry Weight: unknown  Bi-V Pacing 81.3     In the past month, have you:  1. Gained more than 2 pounds in a day or more than 5 pounds in a week? N/A  2. Had changes in your medications (with verification of current medications)? N/A  3. Had more shortness of breath than is usual for you? N/A  4. Limited your activity because of shortness of breath? N/A  5. Not been able to sleep because of shortness of breath? N/A  6. Had increased swelling in your feet, ankles, legs or stomach area? N/A  7. Had symptoms of dehydration (dizziness, dry mouth, increased thirst, decreased urine output) N/A  8. Had changes in sodium restriction? N/A  9. Been compliant with medication? N/A  ICM trend: 3 month view for 10/31/2015  ICM trend: 1 year view for 10/31/2015   Follow-up plan: ICM clinic phone appointment 11/03/2015.  Attempted call to patient and unable to reach.  Transmission reviewed.  FLUID LEVELS: Optivol thoracic impedance decreased 10/07/2015 to 10/31/2015 suggesting fluid accumulation.    Repeat fluid level check on 11/03/2015.    Karie Soda, RN, CCM 10/31/2015 3:06 PM

## 2015-11-03 ENCOUNTER — Encounter: Payer: Self-pay | Admitting: Vascular Surgery

## 2015-11-03 ENCOUNTER — Ambulatory Visit (INDEPENDENT_AMBULATORY_CARE_PROVIDER_SITE_OTHER): Payer: Medicare Other

## 2015-11-03 DIAGNOSIS — Z95810 Presence of automatic (implantable) cardiac defibrillator: Secondary | ICD-10-CM

## 2015-11-03 DIAGNOSIS — I5022 Chronic systolic (congestive) heart failure: Secondary | ICD-10-CM

## 2015-11-03 NOTE — Progress Notes (Signed)
EPIC Encounter for ICM Monitoring  Patient Name: Scott Parsons is a 82 y.o. male Date: 11/03/2015 Primary Care Physican: [name], MD Primary Cardiologist: Allyson Sabal Electrophysiologist: Allred Dry Weight: 195.4 lbs   Bi-V Pacing 76.7% (was 97.5% on 08/25/2015)     In the past month, have you:  1. Gained more than 2 pounds in a day or more than 5 pounds in a week?  Yes, gained 6-7 pounds in last 4 weeks which correlates with hospitalization on 09/28/2015.  Prior to hospitalization on 09/08/2015 he weighed 191.8 lbs.  2. Had changes in your medications (with verification of current medications)?  During hospitalization on 09/28/2015, Losartan stopped and started on Entresto 24/26 mg bid.  3. Had more shortness of breath than is usual for you? Yes, more SOB when doing activities such as making the bed in the last 2-3 weeks.    4. Limited your activity because of shortness of breath? No   5. Not been able to sleep because of shortness of breath? No   6. Had increased swelling in your feet, ankles, legs or stomach area? No   7. Had symptoms of dehydration (dizziness, dry mouth, increased thirst, decreased urine output) No   8. Had changes in sodium restriction? No   9. Been compliant with medication? Yes   ICM trend: 3 month view for 11/03/2015   ICM trend: 1 year view for 11/03/2015   Follow-up plan: ICM clinic phone appointment 11/07/2015.    FLUID LEVELS: Optivol thoracic impedance decreased 10/06/2015 to 11/03/2015 suggesting fluid accumulation which correlates with receiving fluid during hospitalization 09/28/2015 - 09/30/2015. Thoracic impedance starting to trend back toward baseline 11/01/2015 after he reported taking 1 extra Furosemide 40 mg on 10/27/2015 and 1/2 tablet Furosemide 40 mg 10/31/2015.     SYMPTOMS:  Weight gain and SOB as noted above.  Patient has reaction to sulfur antibiotic following dermatologist procedure.  He reported his labs were checked at  [facility] on  09/28/2015 which results showed very high Creatinine and Potassium and was told to go to ER and was hospitalized for 2 days and given fluid during those 2 days.   He also had a TIA on 10/09/2015 which was dx at [facility].  He followed up with Dr Allyson Sabal on 10/22/2015.   He has vascular studies scheduled for the next couple of weeks and will check Carotid for blockage.   EDUCATION: Limit sodium intake to < 2000 mg and fluid intake to 64 oz daily.     RECOMMENDATIONS:   Reviewed transmission, Bi-V pacing, symptoms with Dr Johney Frame in the office.  Recommendation to increase Furosemide 40 mg bid x 3 days and then return to normally prescribed Furosemide dosage of 40 mg daily.   .    Attempted call to patient and spoke with wife.  Requested patient call back.        Call back from patient.  Advised of Dr Jenel Lucks recommendation to increase Furosemide 40 mg bid x 3 days and then return to normally prescribed Furosemide dosage of 40 mg daily.   Advised if symptoms        resolve prior to 3rd day then to return to normally prescribed Furosemide dosage of 40 mg daily.       Karie Soda, RN, CCM 11/03/2015 9:49 AM

## 2015-11-05 ENCOUNTER — Other Ambulatory Visit: Payer: Self-pay | Admitting: Cardiovascular Disease

## 2015-11-05 ENCOUNTER — Ambulatory Visit (HOSPITAL_COMMUNITY)
Admission: RE | Admit: 2015-11-05 | Discharge: 2015-11-05 | Disposition: A | Discharge status: Home / Self Care | Payer: Medicare Other | Source: Ambulatory Visit | Attending: Cardiovascular Disease | Admitting: Cardiovascular Disease

## 2015-11-05 DIAGNOSIS — I701 Atherosclerosis of renal artery: Secondary | ICD-10-CM | POA: Diagnosis not present

## 2015-11-05 DIAGNOSIS — I110 Hypertensive heart disease with heart failure: Secondary | ICD-10-CM | POA: Diagnosis not present

## 2015-11-05 DIAGNOSIS — I509 Heart failure, unspecified: Secondary | ICD-10-CM | POA: Diagnosis not present

## 2015-11-05 DIAGNOSIS — I2510 Atherosclerotic heart disease of native coronary artery without angina pectoris: Secondary | ICD-10-CM | POA: Diagnosis not present

## 2015-11-05 DIAGNOSIS — I700 Atherosclerosis of aorta: Secondary | ICD-10-CM | POA: Insufficient documentation

## 2015-11-05 DIAGNOSIS — R938 Abnormal findings on diagnostic imaging of other specified body structures: Secondary | ICD-10-CM | POA: Diagnosis not present

## 2015-11-05 DIAGNOSIS — R7303 Prediabetes: Secondary | ICD-10-CM | POA: Insufficient documentation

## 2015-11-05 DIAGNOSIS — E785 Hyperlipidemia, unspecified: Secondary | ICD-10-CM | POA: Insufficient documentation

## 2015-11-05 DIAGNOSIS — I779 Disorder of arteries and arterioles, unspecified: Secondary | ICD-10-CM

## 2015-11-05 DIAGNOSIS — I70203 Unspecified atherosclerosis of native arteries of extremities, bilateral legs: Secondary | ICD-10-CM | POA: Diagnosis not present

## 2015-11-05 DIAGNOSIS — I739 Peripheral vascular disease, unspecified: Secondary | ICD-10-CM

## 2015-11-05 DIAGNOSIS — I708 Atherosclerosis of other arteries: Secondary | ICD-10-CM | POA: Diagnosis not present

## 2015-11-05 DIAGNOSIS — G4733 Obstructive sleep apnea (adult) (pediatric): Secondary | ICD-10-CM | POA: Diagnosis not present

## 2015-11-05 NOTE — Progress Notes (Signed)
Call to patient's cell number and no answer.  Call to patient's wife (DPR) and advised could not reach patient on cell phone.  She stated he is at [facility] for test today.  Requested she inform him that Dr Allyson Sabal would like for him to make an appointment to see NP or PA when there is an opening due to his fluid symptoms.  She stated she would give him the message so he can schedule it while he is in the office today.

## 2015-11-05 NOTE — Telephone Encounter (Signed)
Spoke with patient.

## 2015-11-05 NOTE — Progress Notes (Signed)
Returned patient call.  He reported he tried to make an appointment at [facility] but the check in person said she thought it might be related to the testing he had done today so he should call me back to find out if an appointment is still needed.  Explained Dr Allyson Sabal wants him to make an appointment with NP or PA due to his current fluid symptoms and fluid readings.  Advised him to call back to [facility] to make an appointment and he can also check [address] office in the event [facility] does not have any appointments in the next week.  He will call today.

## 2015-11-05 NOTE — Progress Notes (Signed)
[name], MD   Sent: Mon November 03, 2015 5:28 PM    To: Karie Soda, RN        Message     Needs OP F/U with [facility].        JJB

## 2015-11-07 ENCOUNTER — Ambulatory Visit (INDEPENDENT_AMBULATORY_CARE_PROVIDER_SITE_OTHER): Payer: Medicare Other

## 2015-11-07 ENCOUNTER — Telehealth: Payer: Self-pay

## 2015-11-07 DIAGNOSIS — I5022 Chronic systolic (congestive) heart failure: Secondary | ICD-10-CM

## 2015-11-07 DIAGNOSIS — Z95810 Presence of automatic (implantable) cardiac defibrillator: Secondary | ICD-10-CM

## 2015-11-07 NOTE — Telephone Encounter (Signed)
Attempted call to patient and spoke with wife.  She stated he would be home in an hour.  Advised I left message on his cell phone to call and requested she ask him to send ICM remote transmission.

## 2015-11-07 NOTE — Progress Notes (Addendum)
EPIC Encounter for ICM Monitoring  Patient Name: Scott Parsons is a 82 y.o. male Date: 11/07/2015 Primary Care Physican: [name], MD Primary Cardiologist: Allyson Sabal Electrophysiologist: Allred  Dry Weight: 193.8 lb  Bi-V Pacing:  77%       Heart Failure questions reviewed.  Loss of 1.6 lbs since doubling Furosemide x 3 days starting 11/03/2014. SOB remains unchanged.  He denied any acute distress.  Has office appointment for follow up on fluid symptoms on 11/14/2015.  Advised he can take only 1 extra Furosemide tablet over the weekend if symptoms worsen but needs to use ER he has acute distress.  He verbalized understanding.  Advised to call Dr Hazle Coca office on 11/10/2015 if symptoms worsen if needed.  Reviewed recommendations with Gypsy Balsam, NP and agreed with plan.    Since ICM remote transmission 11/03/2015, Thoracic impedence continues to below baseline but improvement after doubling Furosemide.  Advised to be sure and follow low sodium diet    ICM trend: 11/07/2015    Follow-up plan: ICM clinic phone appointment on 11/13/2015.  Copy of ICM check sent to primary cardiologist and device physician.   Karie Soda, RN 11/07/2015 1:53 PM   Hillis Range, MD   Sent: Sat November 08, 2015 2:05 PM    To: Karie Soda, RN        Message     Randie Heinz    Thanks

## 2015-11-12 ENCOUNTER — Inpatient Hospital Stay (HOSPITAL_COMMUNITY)
Admission: EM | Admit: 2015-11-12 | Discharge: 2015-11-14 | DRG: 286 | Disposition: A | Discharge status: Home / Self Care | Payer: Medicare Other | Source: Other Acute Inpatient Hospital | Attending: Internal Medicine | Admitting: Internal Medicine

## 2015-11-12 ENCOUNTER — Ambulatory Visit: Payer: Medicare Other | Admitting: Vascular Surgery

## 2015-11-12 ENCOUNTER — Encounter (HOSPITAL_COMMUNITY): Payer: Self-pay | Admitting: *Deleted

## 2015-11-12 ENCOUNTER — Inpatient Hospital Stay (HOSPITAL_COMMUNITY): Payer: Medicare Other

## 2015-11-12 ENCOUNTER — Encounter (HOSPITAL_COMMUNITY): Payer: Medicare Other

## 2015-11-12 DIAGNOSIS — I447 Left bundle-branch block, unspecified: Secondary | ICD-10-CM | POA: Diagnosis present

## 2015-11-12 DIAGNOSIS — E785 Hyperlipidemia, unspecified: Secondary | ICD-10-CM | POA: Diagnosis present

## 2015-11-12 DIAGNOSIS — R0602 Shortness of breath: Secondary | ICD-10-CM | POA: Diagnosis present

## 2015-11-12 DIAGNOSIS — R634 Abnormal weight loss: Secondary | ICD-10-CM | POA: Diagnosis present

## 2015-11-12 DIAGNOSIS — I130 Hypertensive heart and chronic kidney disease with heart failure and stage 1 through stage 4 chronic kidney disease, or unspecified chronic kidney disease: Secondary | ICD-10-CM | POA: Diagnosis present

## 2015-11-12 DIAGNOSIS — Z95810 Presence of automatic (implantable) cardiac defibrillator: Secondary | ICD-10-CM

## 2015-11-12 DIAGNOSIS — Z8673 Personal history of transient ischemic attack (TIA), and cerebral infarction without residual deficits: Secondary | ICD-10-CM | POA: Diagnosis not present

## 2015-11-12 DIAGNOSIS — Z79899 Other long term (current) drug therapy: Secondary | ICD-10-CM

## 2015-11-12 DIAGNOSIS — I428 Other cardiomyopathies: Secondary | ICD-10-CM | POA: Diagnosis present

## 2015-11-12 DIAGNOSIS — N184 Chronic kidney disease, stage 4 (severe): Secondary | ICD-10-CM | POA: Diagnosis present

## 2015-11-12 DIAGNOSIS — I739 Peripheral vascular disease, unspecified: Secondary | ICD-10-CM | POA: Diagnosis present

## 2015-11-12 DIAGNOSIS — I248 Other forms of acute ischemic heart disease: Secondary | ICD-10-CM | POA: Diagnosis present

## 2015-11-12 DIAGNOSIS — G4733 Obstructive sleep apnea (adult) (pediatric): Secondary | ICD-10-CM | POA: Diagnosis present

## 2015-11-12 DIAGNOSIS — J9601 Acute respiratory failure with hypoxia: Secondary | ICD-10-CM | POA: Diagnosis present

## 2015-11-12 DIAGNOSIS — J9621 Acute and chronic respiratory failure with hypoxia: Secondary | ICD-10-CM | POA: Diagnosis present

## 2015-11-12 DIAGNOSIS — I779 Disorder of arteries and arterioles, unspecified: Secondary | ICD-10-CM | POA: Diagnosis not present

## 2015-11-12 DIAGNOSIS — I701 Atherosclerosis of renal artery: Secondary | ICD-10-CM | POA: Diagnosis present

## 2015-11-12 DIAGNOSIS — R7303 Prediabetes: Secondary | ICD-10-CM | POA: Diagnosis present

## 2015-11-12 DIAGNOSIS — I2510 Atherosclerotic heart disease of native coronary artery without angina pectoris: Secondary | ICD-10-CM | POA: Diagnosis present

## 2015-11-12 DIAGNOSIS — R7989 Other specified abnormal findings of blood chemistry: Secondary | ICD-10-CM | POA: Diagnosis not present

## 2015-11-12 DIAGNOSIS — I509 Heart failure, unspecified: Secondary | ICD-10-CM | POA: Diagnosis not present

## 2015-11-12 DIAGNOSIS — I6522 Occlusion and stenosis of left carotid artery: Secondary | ICD-10-CM | POA: Diagnosis present

## 2015-11-12 DIAGNOSIS — I5022 Chronic systolic (congestive) heart failure: Secondary | ICD-10-CM

## 2015-11-12 DIAGNOSIS — Z87891 Personal history of nicotine dependence: Secondary | ICD-10-CM | POA: Diagnosis not present

## 2015-11-12 DIAGNOSIS — J449 Chronic obstructive pulmonary disease, unspecified: Secondary | ICD-10-CM | POA: Diagnosis present

## 2015-11-12 DIAGNOSIS — Z7982 Long term (current) use of aspirin: Secondary | ICD-10-CM | POA: Diagnosis not present

## 2015-11-12 DIAGNOSIS — I5043 Acute on chronic combined systolic (congestive) and diastolic (congestive) heart failure: Secondary | ICD-10-CM | POA: Diagnosis present

## 2015-11-12 DIAGNOSIS — I472 Ventricular tachycardia: Secondary | ICD-10-CM | POA: Diagnosis not present

## 2015-11-12 DIAGNOSIS — Z7902 Long term (current) use of antithrombotics/antiplatelets: Secondary | ICD-10-CM | POA: Diagnosis not present

## 2015-11-12 DIAGNOSIS — E876 Hypokalemia: Secondary | ICD-10-CM | POA: Diagnosis present

## 2015-11-12 DIAGNOSIS — R778 Other specified abnormalities of plasma proteins: Secondary | ICD-10-CM

## 2015-11-12 HISTORY — DX: Chronic combined systolic (congestive) and diastolic (congestive) heart failure: I50.42

## 2015-11-12 HISTORY — DX: Chronic kidney disease, stage 4 (severe): N18.4

## 2015-11-12 HISTORY — DX: Hypertensive heart disease with heart failure: I11.0

## 2015-11-12 LAB — COMPREHENSIVE METABOLIC PANEL
ALK PHOS: 111 U/L (ref 38–126)
ALT: 53 U/L (ref 17–63)
AST: 46 U/L — ABNORMAL HIGH (ref 15–41)
Albumin: 3.2 g/dL — ABNORMAL LOW (ref 3.5–5.0)
Anion gap: 10 (ref 5–15)
BILIRUBIN TOTAL: 1.1 mg/dL (ref 0.3–1.2)
BUN: 38 mg/dL — ABNORMAL HIGH (ref 6–20)
CALCIUM: 9 mg/dL (ref 8.9–10.3)
CO2: 24 mmol/L (ref 22–32)
CREATININE: 2.08 mg/dL — AB (ref 0.61–1.24)
Chloride: 108 mmol/L (ref 101–111)
GFR, EST AFRICAN AMERICAN: 32 mL/min — AB (ref >60)
GFR, EST NON AFRICAN AMERICAN: 28 mL/min — AB (ref >60)
Glucose, Bld: 224 mg/dL — ABNORMAL HIGH (ref 65–99)
Potassium: 4.2 mmol/L (ref 3.5–5.1)
Sodium: 142 mmol/L (ref 135–145)
TOTAL PROTEIN: 5.5 g/dL — AB (ref 6.5–8.1)

## 2015-11-12 LAB — CBC WITH DIFFERENTIAL/PLATELET
BASOS PCT: 0 %
Basophils Absolute: 0 10*3/uL (ref 0.0–0.1)
Eosinophils Absolute: 0 10*3/uL (ref 0.0–0.7)
Eosinophils Relative: 0 %
HEMATOCRIT: 41.5 % (ref 39.0–52.0)
HEMOGLOBIN: 13.3 g/dL (ref 13.0–17.0)
LYMPHS ABS: 0.7 10*3/uL (ref 0.7–4.0)
LYMPHS PCT: 11 %
MCH: 32.4 pg (ref 26.0–34.0)
MCHC: 32 g/dL (ref 30.0–36.0)
MCV: 101 fL — ABNORMAL HIGH (ref 78.0–100.0)
MONOS PCT: 10 %
Monocytes Absolute: 0.6 10*3/uL (ref 0.1–1.0)
NEUTROS ABS: 4.7 10*3/uL (ref 1.7–7.7)
NEUTROS PCT: 79 %
Platelets: 151 10*3/uL (ref 150–400)
RBC: 4.11 MIL/uL — ABNORMAL LOW (ref 4.22–5.81)
RDW: 13.9 % (ref 11.5–15.5)
WBC: 6 10*3/uL (ref 4.0–10.5)

## 2015-11-12 LAB — PHOSPHORUS: PHOSPHORUS: 3.1 mg/dL (ref 2.5–4.6)

## 2015-11-12 LAB — PROCALCITONIN: PROCALCITONIN: 0.15 ng/mL

## 2015-11-12 LAB — MAGNESIUM: MAGNESIUM: 2.1 mg/dL (ref 1.7–2.4)

## 2015-11-12 LAB — MRSA PCR SCREENING: MRSA BY PCR: NEGATIVE

## 2015-11-12 LAB — PROTIME-INR
INR: 1.18 (ref 0.00–1.49)
Prothrombin Time: 15.1 seconds (ref 11.6–15.2)

## 2015-11-12 LAB — BRAIN NATRIURETIC PEPTIDE: B Natriuretic Peptide: 4293.7 pg/mL — ABNORMAL HIGH (ref 0.0–100.0)

## 2015-11-12 LAB — TROPONIN I
Troponin I: 0.34 ng/mL (ref <0.03)
Troponin I: 0.34 ng/mL (ref <0.03)
Troponin I: 0.22 ng/mL (ref <0.03)

## 2015-11-12 MED ORDER — ALBUTEROL SULFATE (2.5 MG/3ML) 0.083% IN NEBU: 2.5000 mg | INHALATION_SOLUTION | RESPIRATORY_TRACT | Status: DC | PRN

## 2015-11-12 MED ORDER — SODIUM CHLORIDE 0.9% FLUSH: 3.0000 mL | INTRAVENOUS | Status: DC | PRN

## 2015-11-12 MED ORDER — SODIUM CHLORIDE 0.9% FLUSH
3.0000 mL | Freq: Two times a day (BID) | INTRAVENOUS | Status: DC
  Administered 2015-11-12 – 2015-11-14 (×5): 3 mL via INTRAVENOUS

## 2015-11-12 MED ORDER — FUROSEMIDE 10 MG/ML IJ SOLN
40.0000 mg | Freq: Two times a day (BID) | INTRAMUSCULAR | Status: DC
  Administered 2015-11-12 (×2): 40 mg via INTRAVENOUS
  Filled 2015-11-12: qty 4

## 2015-11-12 MED ORDER — CETYLPYRIDINIUM CHLORIDE 0.05 % MT LIQD
7.0000 mL | OROMUCOSAL | Status: DC
  Administered 2015-11-12 (×2): 7 mL via OROMUCOSAL

## 2015-11-12 MED ORDER — SODIUM CHLORIDE 0.9 % IV SOLN: 250.0000 mL | INTRAVENOUS | Status: DC | PRN

## 2015-11-12 MED ORDER — SACUBITRIL-VALSARTAN 24-26 MG PO TABS
1.0000 | ORAL_TABLET | Freq: Every day | ORAL | Status: DC
  Administered 2015-11-12 – 2015-11-14 (×3): 1 via ORAL
  Filled 2015-11-12 (×3): qty 1

## 2015-11-12 MED ORDER — METOPROLOL SUCCINATE ER 100 MG PO TB24
200.0000 mg | ORAL_TABLET | Freq: Every day | ORAL | Status: DC
  Administered 2015-11-12 – 2015-11-14 (×3): 200 mg via ORAL
  Filled 2015-11-12 (×3): qty 2

## 2015-11-12 MED ORDER — PANTOPRAZOLE SODIUM 40 MG PO TBEC
40.0000 mg | DELAYED_RELEASE_TABLET | Freq: Every day | ORAL | Status: DC
  Administered 2015-11-12 – 2015-11-14 (×3): 40 mg via ORAL
  Filled 2015-11-12 (×3): qty 1

## 2015-11-12 MED ORDER — CLOPIDOGREL BISULFATE 75 MG PO TABS
75.0000 mg | ORAL_TABLET | Freq: Every day | ORAL | Status: DC
  Administered 2015-11-12 – 2015-11-14 (×2): 75 mg via ORAL
  Filled 2015-11-12 (×2): qty 1

## 2015-11-12 MED ORDER — FUROSEMIDE 10 MG/ML IJ SOLN
40.0000 mg | Freq: Two times a day (BID) | INTRAMUSCULAR | Status: DC
  Filled 2015-11-12: qty 4

## 2015-11-12 MED ORDER — ASPIRIN EC 81 MG PO TBEC
81.0000 mg | DELAYED_RELEASE_TABLET | Freq: Every day | ORAL | Status: DC
  Administered 2015-11-12 – 2015-11-14 (×2): 81 mg via ORAL
  Filled 2015-11-12 (×2): qty 1

## 2015-11-12 MED ORDER — ATORVASTATIN CALCIUM 20 MG PO TABS
20.0000 mg | ORAL_TABLET | Freq: Every day | ORAL | Status: DC
  Administered 2015-11-12 – 2015-11-14 (×3): 20 mg via ORAL
  Filled 2015-11-12 (×4): qty 1

## 2015-11-12 MED ORDER — ISOSORBIDE MONONITRATE ER 30 MG PO TB24
30.0000 mg | ORAL_TABLET | Freq: Every day | ORAL | Status: DC
  Administered 2015-11-12 – 2015-11-14 (×3): 30 mg via ORAL
  Filled 2015-11-12 (×3): qty 1

## 2015-11-12 MED ORDER — CETYLPYRIDINIUM CHLORIDE 0.05 % MT LIQD
7.0000 mL | Freq: Two times a day (BID) | OROMUCOSAL | Status: DC
  Administered 2015-11-12 – 2015-11-13 (×3): 7 mL via OROMUCOSAL

## 2015-11-12 MED ORDER — IPRATROPIUM-ALBUTEROL 0.5-2.5 (3) MG/3ML IN SOLN
3.0000 mL | Freq: Four times a day (QID) | RESPIRATORY_TRACT | Status: DC
  Administered 2015-11-12 (×3): 3 mL via RESPIRATORY_TRACT
  Filled 2015-11-12 (×3): qty 3

## 2015-11-12 MED ORDER — HEPARIN SODIUM (PORCINE) 5000 UNIT/ML IJ SOLN
5000.0000 [IU] | Freq: Three times a day (TID) | INTRAMUSCULAR | Status: DC
  Administered 2015-11-12 – 2015-11-13 (×4): 5000 [IU] via SUBCUTANEOUS
  Filled 2015-11-12 (×4): qty 1

## 2015-11-12 MED ORDER — ALLOPURINOL 100 MG PO TABS
100.0000 mg | ORAL_TABLET | Freq: Every day | ORAL | Status: DC
  Administered 2015-11-13 – 2015-11-14 (×2): 100 mg via ORAL
  Filled 2015-11-12 (×2): qty 1

## 2015-11-12 MED ORDER — GABAPENTIN 300 MG PO CAPS
300.0000 mg | ORAL_CAPSULE | Freq: Every day | ORAL | Status: DC
  Administered 2015-11-12 – 2015-11-13 (×2): 300 mg via ORAL
  Filled 2015-11-12 (×2): qty 1

## 2015-11-12 NOTE — H&P (Signed)
PULMONARY / CRITICAL CARE MEDICINE   Name: Scott Parsons MRN: [id number] DOB: May 13, 1929    ADMISSION DATE:  11/12/2015 CONSULTATION DATE:  11/12/2015  REFERRING MD:  [facility] EDP Dr. Livia Snellen  CHIEF COMPLAINT:  SOB  HISTORY OF PRESENT ILLNESS:   82 year old male with PMH as below, which includes CAD, CKD 3, COPD, Systolic CHF (LVEF 79%), OSA, ICD in place, and HTN. He lives at home with his wife and was in his usual state of health until about one week ago when he was contacted by his Tele-health service to let him know that he had been gaining weight and his lasix dose was doubled for 3 days. He did have documented weight loss with this. He has also had some progressive SOB and cough since that time. Cough productive for minimal yellow sputum. Denies fevers and chills. 7/4 he could really tell something was wrong because he wasn't in the mood to eat. Dyspnea worsened and EMS was called. Upon arrival to ED he was noted to be hypoxic with O2 sats in the uppers 80s. He was placed on NRB with rise to 92%. CXR consistent with pulmonary edema and lasix 80 mg given. BiPAP was started, but he could not tolerate. He was transferred to [facility] for ICU admission.   PAST MEDICAL HISTORY :  He  has a past medical history of CAD (coronary artery disease); Hypertension; Hyperlipidemia; History of stress test (12/08/2009); Doppler ultrasound; echocardiogram (01/18/2005); OSA (obstructive sleep apnea); PVD (peripheral vascular disease) ([place]); Renal artery stenosis ([place]); History of renal stent; History of stroke; Carotid artery disease ([place]); Nonischemic cardiomyopathy ([place]); Chronic systolic dysfunction of left ventricle; LBBB (left bundle branch block); CHF (congestive heart failure) ([place]); TIA (transient ischemic attack) (06/2010); Pre-diabetes; and ICD (implantable cardioverter-defibrillator), dual, in situ.  PAST SURGICAL HISTORY: He  has past surgical history that includes Cardiac  catheterization (2003); Cardiac catheterization (10/18/2005); Hand surgery (01/1995); Hernia repair; Bi-ventricular implantable cardioverter defibrillator  (crt-d) (09-13-2013); and bi-ventricular implantable cardioverter defibrillator (N/A, 09/13/2013).  Allergies  Allergen Reactions  . Amoxicillin Itching    Itching    . Biaxin [Clarithromycin] Itching    Lip swelling     . Erythromycin Itching  . Penicillins Itching  . Shellfish Allergy Itching          No current facility-administered medications on file prior to encounter.   Current Outpatient Prescriptions on File Prior to Encounter  Medication Sig  . allopurinol (ZYLOPRIM) 100 MG tablet Take 100 mg by mouth daily.  [name] aspirin EC 81 MG tablet Take 81 mg by mouth daily.  [name] atorvastatin (LIPITOR) 20 MG tablet Take 20 mg by mouth daily.  . Blood Glucose Monitoring Suppl (FIFTY50 GLUCOSE METER 2.0) w/Device KIT   . clopidogrel (PLAVIX) 75 MG tablet Take 75 mg by mouth daily.  . Coenzyme Q10 (CO Q 10) 100 MG CAPS Take 100 mg by mouth daily.  . furosemide (LASIX) 40 MG tablet Take 1 tablet (40 mg total) by mouth daily.  [name] gabapentin (NEURONTIN) 300 MG capsule TAKE 1-3 CAPSULES BY MOUTH AT BEDTIME  . isosorbide mononitrate (IMDUR) 30 MG 24 hr tablet Take 30 mg by mouth daily.  . Magnesium 250 MG TABS Take 2 tablets by mouth daily.  . metoprolol succinate (TOPROL-XL) 100 MG 24 hr tablet Take 200 mg by mouth daily. Take with or immediately following a meal.  . niacin 500 MG tablet Take 500 mg by mouth daily with breakfast.  . NITROSTAT 0.4  MG SL tablet Place 0.4 mg under the tongue every 5 (five) minutes as needed for chest pain (MAX 3 TABLETS).   . NON FORMULARY Wears O2 at night (2 L)  . NON FORMULARY Use as directed - [employer] diabetic [employer]  . sacubitril-valsartan (ENTRESTO) 24-26 MG Take 1 tablet by mouth daily.  [name] zolpidem (AMBIEN) 10 MG tablet Take 5 mg by mouth at bedtime.     FAMILY HISTORY:  His indicated that his  mother is deceased. He indicated that his father is deceased. He indicated that his maternal grandmother is deceased. He indicated that his maternal grandfather is deceased. He indicated that his paternal grandmother is deceased. He indicated that his paternal grandfather is deceased.   SOCIAL HISTORY: He  reports that he quit smoking about 11 years ago. His smoking use included Cigarettes. He quit after 52 years of use. He has never used smokeless tobacco. He reports that he does not drink alcohol or use illicit drugs.  REVIEW OF SYSTEMS:   Bolds are positive  Constitutional: weight loss, gain, night sweats, Fevers, chills, fatigue .  HEENT: headaches, Sore throat, sneezing, nasal congestion, post nasal drip, Difficulty swallowing, Tooth/dental problems, visual complaints visual changes, ear ache CV:  chest pain, radiates: ,Orthopnea, PND, swelling in lower extremities, dizziness, palpitations, syncope.  GI  heartburn, indigestion, abdominal pain, nausea, vomiting, diarrhea, change in bowel habits, loss of appetite, bloody stools.  Resp: cough, productive: , hemoptysis, dyspnea, chest pain, pleuritic.  Skin: rash or itching or icterus GU: dysuria, change in color of urine, urgency or frequency. flank pain, hematuria  MS: joint pain or swelling. decreased range of motion  Psych: change in mood or affect. depression or anxiety.  Neuro: difficulty with speech, weakness, numbness, ataxia    SUBJECTIVE:   VITAL SIGNS: BP 129/59 mmHg  Pulse 92  Temp(Src) 97.8 F (36.6 C) (Oral)  Resp 14  SpO2 97%  HEMODYNAMICS:    VENTILATOR SETTINGS:    INTAKE / OUTPUT:    PHYSICAL EXAMINATION: General:  Elderly male in NAD on NRB Neuro:  Alert, oriented, non-focal HEENT:  [place]/AT, PERRL, no JVD Cardiovascular:  RRR, no MRG, +1 BLE edema.  Lungs:  Coarse rhonchi primarily on expiration Abdomen:  Soft, non-tender, non-distended Musculoskeletal:  No acute deformity or ROM limitation Skin:   Grossly intact  LABS:  BMET No results for input(s): NA, K, CL, CO2, BUN, CREATININE, GLUCOSE in the last 168 hours.  Electrolytes No results for input(s): CALCIUM, MG, PHOS in the last 168 hours.  CBC No results for input(s): WBC, HGB, HCT, PLT in the last 168 hours.  Coag's No results for input(s): APTT, INR in the last 168 hours.  Sepsis Markers No results for input(s): LATICACIDVEN, PROCALCITON, O2SATVEN in the last 168 hours.  ABG No results for input(s): PHART, PCO2ART, PO2ART in the last 168 hours.  Liver Enzymes No results for input(s): AST, ALT, ALKPHOS, BILITOT, ALBUMIN in the last 168 hours.  Cardiac Enzymes No results for input(s): TROPONINI, PROBNP in the last 168 hours.  Glucose No results for input(s): GLUCAP in the last 168 hours.  Imaging No results found.   STUDIES:    CULTURES: BCx2 7/5 >  ANTIBIOTICS: Aztreonam, Vancomycin in ED  SIGNIFICANT EVENTS: 7/5 admit  LINES/TUBES:   DISCUSSION:   ASSESSMENT / PLAN:  PULMONARY A: Acute on chronic hypoxemic respiratory failure Pulmonary edema secondary to CHF exacerbation COPD without acute exacerbation OSA on CPAP Less likely PNA, NOT imporessed  P:   Supplemental  O2 to keep SpO2 > 90 - 95% Diuresis as below Pulmonary hygiene Scheduled/PRN nebulized bronchodilators Follow CXR  CARDIOVASCULAR A:  Acute on chronic systolic CHF (LVEF known 61-25%) CAD (non-critical in 2003) H/o HTN  P:  Telemetry monitoring EKG Recent Echo on file BNP Lasix 27m BID staring in AM (Received 870min ED) Daily weights, Strict I&O Resume metoprolol and entresto Consult cardiology in AM  RENAL A:   CKD 3  P:   Repeat BMP  GASTROINTESTINAL A:   No acute issues  P:   PO protonix for SUP NPO for tonight except sips w meds  HEMATOLOGIC A:   No acute issues  P:  Check CBC, Coags  INFECTIOUS A:   ? PNA  P:   Will hold off ABX for now Check PCT  ENDOCRINE A:   No acute  issues   P:     NEUROLOGIC A:   H/o TIA P:   RASS goal: 0 monitor   FAMILY  - Updates: [facility]  - Inter-disciplinary family meet or Palliative Care meeting due by:  7/12   PaGeorgann HousekeeperAGACNP-BC [facility]/Critical Care Pager [phone] ([phone]/09/2015 3:00 AM       STAFF NOTE: I,Linwood DibblesMD FACP have personally reviewed patient's available data, including medical history, events of note, physical examination and test results as part of my evaluation. I have discussed with resident/NP and other care providers such as pharmacist, RN and RRT. In addition, I personally evaluated patient and elicited key findings of: came with distress now improved, crackles, edema 1 plus lowers, distant HS, crackles bases, NO fevers, normal PCT and wbc, pcxr and clinical h/o c/w chf rt greater left, is neg 750 cc thus far, will maintain this lasix dose and chem in am, trop neg, no further repeat needed, will call cards to assess chf service, repeat pcxr in am, crt is at baseline and tolerating lasix well thus far, bnp imporesse, no abx required, advance diet, I updated pt and family at bedsuide The patient is critically ill with multiple organ systems failure and requires high complexity decision making for assessment and support, frequent evaluation and titration of therapies, application of advanced monitoring technologies and extensive interpretation of multiple databases.   Critical Care Time devoted to patient care services described in this note is40  Minutes. This time reflects time of care of this signee: DaMerrie RoofMD FACP. This critical care time does not reflect procedure time, or teaching time or supervisory time of PA/NP/Med student/Med Resident etc but could involve care discussion time. Rest per NP/medical resident whose note is outlined above and that I agree with   DaLavon PaganiniFeTitus MouldMD, FAWaverlygr: 37[facility]irtPulmonary & Critical Care 11/12/2015 12:31 PM

## 2015-11-12 NOTE — Progress Notes (Signed)
Patient refuses to wear hospital CPAP HS. Patient states he turned in his home machine as well. He is non compliant due to he stated he can not sleep while wearing a CPAP machine. No distress noted.

## 2015-11-12 NOTE — Care Management Important Message (Signed)
Important Message  Patient Details  Name: BUD ZILINSKI MRN: [id number] Date of Birth: 1929/12/19   Medicare Important Message Given:  Yes    Bernadette Hoit 11/12/2015, 11:29 AM

## 2015-11-12 NOTE — Progress Notes (Signed)
Noted frequent PVCs /asymptomatic-MD aware am WNL.

## 2015-11-12 NOTE — Consult Note (Signed)
Cardiology Consult    Patient ID: Scott Parsons MRN: [id number], DOB/AGE: 80/13/1931   Admit date: 11/12/2015 Date of Consult: 11/12/2015  Primary Physician: [name], MD Primary Cardiologist:  Erlene Quan, MD (PV); Marlyne Beards, MD [facility] Requesting Provider: D. Tyson Alias  Patient Profile    82 y/o ? with a h /o NICM, chronic combined CHF, LBBB s/p biV ICD, CKD III - IV, HTN, PAD, Carotid dzs, and RAS s/p renal stenting, who presents on tx from [facility] 2/2 dyspnea and CHF.  Past Medical History   Past Medical History  Diagnosis Date  . Non-obstructive CAD     a. 11/2001 [facility]): LM nl, LAD 78m, LCX nondom, nl, RI large, nl, RCA dominant, 21m/d.  [name] Hypertensive heart disease with CHF (congestive heart failure) (HCC)   . Hyperlipidemia   . OSA (obstructive sleep apnea)     a. Does not tolerate/use CPAP.  [name] PVD (peripheral vascular disease) (HCC)     a. 10/2005 PTA: right external iliac artery angioplasty and left renal artery angioplasty and stenting;  b. 10/2015 ABI's in setting of recurrent claudication: R 0.97, L 0.77 (reduced by 0.12 from prior study).  . Renal artery stenosis (HCC)     a. 10/2005 s/p L renal artery stenting.  [name] History of stroke   . Carotid artery disease (HCC)     a. 04/2015 mild to mod bilat ICA stenosis;  b. 10/2015 >70% LICA stenosis, no significant dzs on right.  . Nonischemic cardiomyopathy (HCC)     a. 11/2001 nonobs cath; b. 09/2013 s/p MDT Ovidio Kin XT CRT-D (ser #[id number]); c. 04/2014 Echo: EF 20-25%;  d. Followed @ [facility] by K. Pernell Dupre, MD.  . Chronic combined systolic and diastolic CHF, NYHA class 4 (HCC)     a. 01/2013 Echo: EF 25-30%; b. 08/2013 Echo: EF 15-20%; c. 04/2014 Echo: EF 20-25%, mild LVH, Gr1 DD, mild MR, mod dil LA.  . LBBB (left bundle branch block)   . TIA (transient ischemic attack) 06/2010  . Pre-diabetes   . ICD (implantable cardioverter-defibrillator), dual, in situ     a. 09/2013 s/p MDT Ovidio Kin XT CRT-D  (ser #[id number]).  . CKD (chronic kidney disease), stage IV (HCC)     a. baseline creat of ~ 1.8;  b. 09/2015 Admitted to [facility] with AKI and hyperkalemia.    Past Surgical History  Procedure Laterality Date  . Cardiac catheterization  2003    Mild LV dysfuntion. he had apical and inferoapical wall motion abnormalities and EF of 45%.  . Cardiac catheterization  10/18/2005    STENTS: left renal artery as well as both iliac arteries. Was stented with 62mm x 46mm Smart Nitinol self-expanding stent replaced with a 78mm x 61mm balloon at nominal pressures, resulting in a reduction od 50% proximal right external iliac artery  stenosis to 0% residual.  . Hand surgery  01/1995  . Hernia repair    . Bi-ventricular implantable cardioverter defibrillator  (crt-d)  09-13-2013    MDT CRTD implanted by Dr Johney Frame  . Bi-ventricular implantable cardioverter defibrillator N/A 09/13/2013    Procedure: BI-VENTRICULAR IMPLANTABLE CARDIOVERTER DEFIBRILLATOR  (CRT-D);  Surgeon: Gardiner Rhyme, MD;  Location: [facility] CATH LAB;  Service: Cardiovascular;  Laterality: N/A;     Allergies  Allergies  Allergen Reactions  . Amoxicillin Itching    Itching    . Biaxin [Clarithromycin] Itching    Lip swelling     . Erythromycin Itching  . Penicillins  Itching  . Shellfish Allergy Itching        . Sulfur     History of Present Illness    82 y/o ? with a h/o NICM s/p cath in 2003 revealing non-obs dzs.  EF has hovered around 25% since then, and he underwent CRT-D in 09/2013 in the setting of LBBB.  He also has a h/o HTN, HL, pre-diabetes, PVD s/p bilat Iliac stenting in 6/07, RAS s/p L RA stenting in 6/07, and carotid arterial dzs (>70% LICA stenosis 10/2015 u/s).  He is followed closely in the [facility] CHF clinic by Devonne Doughty and in May, was found to have worsening of renal fxn along with hyperkalemia.  He was admitted to [facility] and his lisinopril, valsartan, and spironolactone were discontinued.  Labs were stable upon f/u and he  was subsequently placed on entresto.  He was last seen in CHF clinic on 6/5.  He was felt to have mild volume overload @ that time and was advised to increase his lasix for three days.  At the time, he was noting some DOE above baseline.  This did improve.  He saw Dr. Allyson Sabal on 6/14 and was felt to be doing well with the exception of worsening lower ext claudication.  F/u ABI has shown an ABI of 0.77 on the left and he has f/u with Dr. Allyson Sabal scheduled on 7/7 to discuss.  Beginning about two days ago, he began to note some worsening in ex tolerance and also developed a cough.  Remote monitoring of thoracic impedance showed reduced impedance on 6/26, and he was advised to increase his lasix x 3 days.  His wt, which he says runs between 193 and 196 on his home scale had been up slightly, but he did not think it was significant.  With doubling of lasix, he did lose 1.6 lbs, though DOE hadn't changed much.  Thoracic impedance remained below prior baseline but improved when evaluated on 6/30.    On 7/4, he just didn't feel well.  He worked in the yard some and noted malaise and DOE.  He didn't have much of an appetite and only ate a tomato sandwich for dinner (no salt added).  After dinner, around 9 pm, he noted that his dyspnea was worsening w/o provocation.  His wife called EMS and he was taken to the [facility] ER.  There, O2 sats were in the upper 80's and he required NRB  bipap, which he did not tolerate.  CXR showed CHF and he was treated with lasix 80 IV. He was tx to [facility] for further eval. Here, he was admitted by CCM.  With diuresis he has had significant symptomatic improvement and is minus 1.48L thus far.  Wt recorded @ 192 lbs. Has had freq pvc and brief runs of asymptomatic nsvt on tele.  We've been asked to eval and assume care.  Of note, his grand daughter mentioned to nursing staff that he frequently eats cheetos and cheese garlic [place] toast.  Inpatient Medications    . [START ON 11/13/2015] allopurinol   100 mg Oral Daily  . antiseptic oral rinse  7 mL Mouth Rinse BID  . aspirin EC  81 mg Oral Daily  . atorvastatin  20 mg Oral Daily  . clopidogrel  75 mg Oral Daily  . furosemide  40 mg Intravenous Q12H  . gabapentin  300 mg Oral QHS  . heparin  5,000 Units Subcutaneous Q8H  . ipratropium-albuterol  3 mL Nebulization Q6H  .  isosorbide mononitrate  30 mg Oral Daily  . metoprolol succinate  200 mg Oral Daily  . pantoprazole  40 mg Oral Daily  . sacubitril-valsartan  1 tablet Oral Daily  . sodium chloride flush  3 mL Intravenous Q12H    Family History    Family History  Problem Relation Age of Onset  . Alzheimer's disease Mother   . Heart disease Father   . Heart disease Paternal Grandfather   . Heart attack Paternal Grandfather     Social History    Social History   Social History  . Marital Status: Married    Spouse Name: N/A  . Number of Children: N/A  . Years of Education: N/A   Occupational History  . Not on file.   Social History Main Topics  . Smoking status: Former Smoker -- 52 years    Types: Cigarettes    Quit date: 08/11/2004  . Smokeless tobacco: Never Used  . Alcohol Use: No  . Drug Use: No  . Sexual Activity: Not on file   Other Topics Concern  . Not on file   Social History Narrative   Retired.  Lives in [place] with his wife.  Tries to remain active.     Review of Systems    General:  No chills, fever, night sweats or weight changes.  Cardiovascular:  No chest pain, +++ dyspnea on exertion, +++ trace ankle edema above sockline a few days ago, no orthopnea, palpitations, paroxysmal nocturnal dyspnea. Dermatological: No rash, lesions/masses Respiratory: +++ non-productive cough x 2 wks, +++ dyspnea Urologic: No hematuria, dysuria Abdominal:   No nausea, vomiting, diarrhea, bright red blood per rectum, melena, or hematemesis Neurologic:  No visual changes, wkns, changes in mental status. All other systems reviewed and are otherwise negative  except as noted above.  Physical Exam    Blood pressure 136/65, pulse 87, temperature 97.6 F (36.4 C), temperature source Oral, resp. rate 24, height 6\' 2"  (1.88 m), weight 192 lb 10.9 oz (87.4 kg), SpO2 95 %.  General: Pleasant, NAD Psych: Normal affect. Neuro: Alert and oriented X 3. Moves all extremities spontaneously. HEENT: Normal  Neck: Supple.  Soft bilat carotid bruits. JVP to jaw. Lungs:  Resp regular and unlabored, bibasilar crackles. Heart: Irreg, distant, + s3, no s4, 2/6 Syst murmur @ llsb. Abdomen: Soft, non-tender, non-distended, BS + x 4.  Extremities: No clubbing, cyanosis or edema. DP/PT/Radials 2+ and equal bilaterally.  Labs     Recent Labs  11/12/15 0411 11/12/15 0811  TROPONINI 0.34* 0.34*   Lab Results  Component Value Date   WBC 6.0 11/12/2015   HGB 13.3 11/12/2015   HCT 41.5 11/12/2015   MCV 101.0* 11/12/2015   PLT 151 11/12/2015     Recent Labs Lab 11/12/15 0411  NA 142  K 4.2  CL 108  CO2 24  BUN 38*  CREATININE 2.08*  CALCIUM 9.0  PROT 5.5*  BILITOT 1.1  ALKPHOS 111  ALT 53  AST 46*  GLUCOSE 224*    Radiology Studies    CXR 11/11/15 [facility])  IMPRESSION: Diffuse bilateral airspace opacification, right greater than left. This may reflect pulmonary edema or possibly pneumonia. Underlying ARDS cannot be excluded.  Dg Chest Port 1 View  11/12/2015  CLINICAL DATA:  Follow-up pulmonary edema.  Subsequent encounter. EXAM: PORTABLE CHEST 1 VIEW COMPARISON:  Chest radiograph performed 11/11/2015 FINDINGS: Bilateral central airspace opacification, right greater than left, is improved from the prior study. This remains compatible with pulmonary edema.  Small bilateral pleural effusions are suspected. No pneumothorax is seen. The cardiomediastinal silhouette is normal in size. A pacemaker/AICD is noted overlying the left chest wall, with leads ending overlying the right atrium, right ventricle and coronary sinus. No acute osseous  abnormalities are seen. IMPRESSION: Interval improvement in bilateral central airspace opacification, right greater than left, reflecting improving pulmonary edema. Small bilateral pleural effusions suspected. Electronically Signed   By: Roanna Raider M.D.   On: 11/12/2015 03:21    ECG & Cardiac Imaging    A sensed, V paced, 115, pvc's, LBBB.  Assessment & Plan    1.  Acute on chronic combined systolic and diastolic CHF/NICM:  H/o NICM dating back at least to 2003 (he says first admission was in the 90's though).  Last EF by echo in 04/2014 was 20-25%.  He is s/p CRT-D in 09/2013.  Since early June, he has been having some issues with volume and required a few days of additional lasix in early June and then again in late June in the setting of reduced thoracic impedances noted on remote device transmissions.  He has had a non-productive cough and some reduction in exercise tolerance over the past 2 wks but says that his weight was still trending between 193 and 196.  Family indicates that he isn't always compliant with diet and eats salty snacks.  Dyspnea worsened on 7/4, prompting presentation to [facility] where he was found to be hypoxic with CHF on cxr.  He was tx to [facility] for further eval.  With diuresis, he has had significant symptomatic improvement.  Wt down to 192 lbs.  Still with evidence of volume overload on exam  crackles, JVD.  BUN/Creat 37/1.9 upon presentation, currently 38/2.08.  Continue diuresis,  blocker, entresto, and nitrate therapy.  With resting HR of high 70's to 80, could consider ivabradine.  2.  Troponin elevation:  Trop 0.34 x 2.  Likely represents demand ischemia in setting of #1.  Cont asa, statin,  blocker.  No chest pain.  He has a h/o nonobs CAD by cath in 2003 with moderate LAD and RCA dzs.  ? Potential role of ischemia/progression of CAD in current decompensation.  Consider ischemic eval, understanding that his CKD may make him a poor cath candidate.  3.   Hypertensive Heart Dzs w/CHF:  BP stable on  blocker, nitrate, and entresto.  4.  HL:  Cont statin therapy.  5.  CKD IV:  Creat up slightly above baseline.  Follow with diuresis.  Required admission for AKI and hyperkalemia in May @ [facility].  6.  NSVT:  Noted to have frequent pvc's and runs of nsvt on tele.  K/Mg ok.  Cont  blocker.  Interrogate device to determine PVC burden - ? Role in worsened CHF.  Will need ischemic eval.  7.  PAD:  He has been having some worsening of bilat LE claudication.  Recent ABI showed worsening on left  0.77.  He has outpt f/u scheduled with Dr. Allyson Sabal.  8.  Carotid Arterial Dzs:  Recent u/s in June showed > 70% LICA stenosis.  This was performed after TIA like symptoms.  F/u planned for 6 mos.  Cont asa/plavix/statin.  Signed, Nicolasa Ducking, NP 11/12/2015, 2:10 PM   Patient seen and examined independently. Gilford Raid, NP note reviewed carefully - agree with his assessment and plan. I have edited the note based on my findings.   Scott Parsons has a long h/o CHF due to previous NICM with only  mild to moderate CAD on cath in 2003. Despite his EF 20-25% he has done reasonably well until about 2 months ago when his function class really declined. He now has NYHA IIIb symptoms. Over that same period of time he has also struggled with volume management with numerous ups and downs on his Optivol interrogation. He was admitted with severe volume overload and respiratory failure. On exam his volume status is improving but he has prominent s3 and significant MR and I worry about low output HF. Since his last cath he has also developed severe PAD and significant carotid disease so I do worry about the possibility of progressive CAD adding to his cardiomyopathy and I feel he needs an ischemic evaluation.   At this point would repeat echo and continue diuresis. Will check renal function in am if creatinine better will consider R and L cath. If worse of no change, will plan RHC  alone with possible Lexiscan.   I have discussed with Dr. Allyson Sabal who will see him as well tomorrow and discuss the plan with him and also address his carotid stenosis.   ICD interrogation done personally. No VT/AF. PVCs about 5% of time. 100% biV pacing.   Bensimhon, Daniel,MD 6:02 PM

## 2015-11-13 ENCOUNTER — Encounter (HOSPITAL_COMMUNITY)
Admission: EM | Disposition: A | Discharge status: Home / Self Care | Payer: Self-pay | Source: Other Acute Inpatient Hospital | Attending: Internal Medicine

## 2015-11-13 ENCOUNTER — Inpatient Hospital Stay (HOSPITAL_COMMUNITY): Payer: Medicare Other

## 2015-11-13 DIAGNOSIS — R7989 Other specified abnormal findings of blood chemistry: Secondary | ICD-10-CM

## 2015-11-13 DIAGNOSIS — I779 Disorder of arteries and arterioles, unspecified: Secondary | ICD-10-CM

## 2015-11-13 DIAGNOSIS — I509 Heart failure, unspecified: Secondary | ICD-10-CM

## 2015-11-13 HISTORY — PX: CARDIAC CATHETERIZATION: SHX172

## 2015-11-13 LAB — BASIC METABOLIC PANEL
Anion gap: 9 (ref 5–15)
BUN: 36 mg/dL — AB (ref 6–20)
CHLORIDE: 108 mmol/L (ref 101–111)
CO2: 25 mmol/L (ref 22–32)
CREATININE: 1.93 mg/dL — AB (ref 0.61–1.24)
Calcium: 9.1 mg/dL (ref 8.9–10.3)
GFR, EST AFRICAN AMERICAN: 35 mL/min — AB (ref >60)
GFR, EST NON AFRICAN AMERICAN: 30 mL/min — AB (ref >60)
Glucose, Bld: 154 mg/dL — ABNORMAL HIGH (ref 65–99)
POTASSIUM: 3.3 mmol/L — AB (ref 3.5–5.1)
SODIUM: 142 mmol/L (ref 135–145)

## 2015-11-13 LAB — ECHOCARDIOGRAM COMPLETE
AOVTI: 40.8 cm
AV Area VTI index: 0.49 cm2/m2
AV Area VTI: 0.85 cm2
AV Area mean vel: 0.82 cm2
AV Mean grad: 8 mmHg
AV area mean vel ind: 0.39 cm2/m2
AV pk vel: 196 cm/s
AV vel: 1.04
AVA: 1.04 cm2
AVCELMEANRAT: 0.26
AVLVOTPG: 1 mmHg
AVPG: 15 mmHg
Ao pk vel: 0.27 m/s
CHL CUP AV PEAK INDEX: 0.4
CHL CUP AV VALUE AREA INDEX: 0.49
CHL CUP MV DEC (S): 204
DOP CAL AO MEAN VELOCITY: 127 cm/s
EWDT: 204 ms
FS: 5 % — AB (ref 28–44)
HEIGHTINCHES: 74 in
IV/PV OW: 1
LA diam end sys: 55 mm
LA diam index: 2.58 cm/m2
LA vol: 127 mL
LASIZE: 55 mm
LAVOLA4C: 76 mL
LAVOLIN: 59.6 mL/m2
LVELAT: 6.81 cm/s
LVOT SV: 42 mL
LVOT VTI: 13.5 cm
LVOT area: 3.14 cm2
LVOT diameter: 20 mm
LVOT peak VTI: 0.33 cm
LVOT peak vel: 53.2 cm/s
MVPKEVEL: 1.8 m/s
PISA EROA: 0.12 cm2
PW: 10 mm — AB (ref 0.6–1.1)
TAPSE: 30.4 mm
TDI e' lateral: 6.81
TDI e' medial: 4.31
VTI: 175 cm
WEIGHTICAEL: 3056 [oz_av]

## 2015-11-13 LAB — CBC
HEMATOCRIT: 39.8 % (ref 39.0–52.0)
HEMATOCRIT: 40.8 % (ref 39.0–52.0)
HEMOGLOBIN: 12.8 g/dL — AB (ref 13.0–17.0)
HEMOGLOBIN: 13.5 g/dL (ref 13.0–17.0)
MCH: 32.2 pg (ref 26.0–34.0)
MCH: 32.9 pg (ref 26.0–34.0)
MCHC: 32.2 g/dL (ref 30.0–36.0)
MCHC: 33.1 g/dL (ref 30.0–36.0)
MCV: 100 fL (ref 78.0–100.0)
MCV: 99.5 fL (ref 78.0–100.0)
Platelets: 144 10*3/uL — ABNORMAL LOW (ref 150–400)
Platelets: 135 10*3/uL — ABNORMAL LOW (ref 150–400)
RBC: 3.98 MIL/uL — AB (ref 4.22–5.81)
RBC: 4.1 MIL/uL — AB (ref 4.22–5.81)
RDW: 13.8 % (ref 11.5–15.5)
RDW: 14 % (ref 11.5–15.5)
WBC: 6 10*3/uL (ref 4.0–10.5)
WBC: 6 10*3/uL (ref 4.0–10.5)

## 2015-11-13 LAB — POCT I-STAT 3, VENOUS BLOOD GAS (G3P V)
Acid-Base Excess: 1 mmol/L (ref 0.0–2.0)
BICARBONATE: 23.2 meq/L (ref 20.0–24.0)
BICARBONATE: 24.4 meq/L — AB (ref 20.0–24.0)
O2 Saturation: 55 %
O2 Saturation: 58 %
PCO2 VEN: 35.5 mmHg — AB (ref 45.0–50.0)
PH VEN: 7.445 — AB (ref 7.250–7.300)
PH VEN: 7.445 — AB (ref 7.250–7.300)
PO2 VEN: 27 mmHg — AB (ref 31.0–45.0)
TCO2: 24 mmol/L (ref 0–100)
TCO2: 26 mmol/L (ref 0–100)
pCO2, Ven: 33.8 mmHg — ABNORMAL LOW (ref 45.0–50.0)
pO2, Ven: 29 mmHg — ABNORMAL LOW (ref 31.0–45.0)

## 2015-11-13 LAB — POCT I-STAT 3, ART BLOOD GAS (G3+)
ACID-BASE DEFICIT: 1 mmol/L (ref 0.0–2.0)
BICARBONATE: 22.3 meq/L (ref 20.0–24.0)
O2 SAT: 87 %
PO2 ART: 49 mmHg — AB (ref 80.0–100.0)
TCO2: 23 mmol/L (ref 0–100)
pCO2 arterial: 30.6 mmHg — ABNORMAL LOW (ref 35.0–45.0)
pH, Arterial: 7.47 — ABNORMAL HIGH (ref 7.350–7.450)

## 2015-11-13 LAB — CREATININE, SERUM
CREATININE: 1.78 mg/dL — AB (ref 0.61–1.24)
GFR calc Af Amer: 39 mL/min — ABNORMAL LOW (ref >60)
GFR calc non Af Amer: 34 mL/min — ABNORMAL LOW (ref >60)

## 2015-11-13 LAB — PHOSPHORUS: Phosphorus: 3.4 mg/dL (ref 2.5–4.6)

## 2015-11-13 LAB — MAGNESIUM: MAGNESIUM: 1.9 mg/dL (ref 1.7–2.4)

## 2015-11-13 LAB — POCT ACTIVATED CLOTTING TIME: Activated Clotting Time: 114 seconds

## 2015-11-13 SURGERY — RIGHT/LEFT HEART CATH AND CORONARY ANGIOGRAPHY
Anesthesia: LOCAL

## 2015-11-13 MED ORDER — IOPAMIDOL (ISOVUE-370) INJECTION 76%
INTRAVENOUS | Status: DC | PRN
  Administered 2015-11-13: 30 mL via INTRA_ARTERIAL

## 2015-11-13 MED ORDER — IOPAMIDOL (ISOVUE-370) INJECTION 76%
INTRAVENOUS | Status: AC
  Filled 2015-11-13: qty 100

## 2015-11-13 MED ORDER — IPRATROPIUM-ALBUTEROL 0.5-2.5 (3) MG/3ML IN SOLN: 3.0000 mL | Freq: Four times a day (QID) | RESPIRATORY_TRACT | Status: DC | PRN

## 2015-11-13 MED ORDER — SODIUM CHLORIDE 0.9% FLUSH: 3.0000 mL | INTRAVENOUS | Status: DC | PRN

## 2015-11-13 MED ORDER — SODIUM CHLORIDE 0.9% FLUSH
3.0000 mL | Freq: Two times a day (BID) | INTRAVENOUS | Status: DC
  Administered 2015-11-13 – 2015-11-14 (×2): 3 mL via INTRAVENOUS

## 2015-11-13 MED ORDER — ACETAMINOPHEN 325 MG PO TABS: 650.0000 mg | ORAL_TABLET | ORAL | Status: DC | PRN

## 2015-11-13 MED ORDER — POTASSIUM CHLORIDE CRYS ER 20 MEQ PO TBCR
40.0000 meq | EXTENDED_RELEASE_TABLET | Freq: Once | ORAL | Status: AC
  Administered 2015-11-13: 40 meq via ORAL

## 2015-11-13 MED ORDER — HEPARIN (PORCINE) IN NACL 2-0.9 UNIT/ML-% IJ SOLN
INTRAMUSCULAR | Status: AC
  Filled 2015-11-13: qty 1000

## 2015-11-13 MED ORDER — SODIUM CHLORIDE 0.9 % IV SOLN: INTRAVENOUS | Status: AC

## 2015-11-13 MED ORDER — LIDOCAINE HCL (PF) 1 % IJ SOLN
INTRAMUSCULAR | Status: AC
  Filled 2015-11-13: qty 30

## 2015-11-13 MED ORDER — HEPARIN SODIUM (PORCINE) 5000 UNIT/ML IJ SOLN
5000.0000 [IU] | Freq: Three times a day (TID) | INTRAMUSCULAR | Status: DC
  Filled 2015-11-13: qty 1

## 2015-11-13 MED ORDER — ONDANSETRON HCL 4 MG/2ML IJ SOLN: 4.0000 mg | Freq: Four times a day (QID) | INTRAMUSCULAR | Status: DC | PRN

## 2015-11-13 MED ORDER — SODIUM CHLORIDE 0.9 % IV SOLN: 250.0000 mL | INTRAVENOUS | Status: DC | PRN

## 2015-11-13 MED ORDER — HEPARIN (PORCINE) IN NACL 2-0.9 UNIT/ML-% IJ SOLN
INTRAMUSCULAR | Status: DC | PRN
  Administered 2015-11-13: 1000 mL

## 2015-11-13 MED ORDER — LIDOCAINE HCL (PF) 1 % IJ SOLN
INTRAMUSCULAR | Status: DC | PRN
  Administered 2015-11-13: 2 mL via INTRADERMAL
  Administered 2015-11-13: 15 mL via INTRADERMAL

## 2015-11-13 MED ORDER — ZOLPIDEM TARTRATE 5 MG PO TABS
5.0000 mg | ORAL_TABLET | Freq: Every evening | ORAL | Status: DC | PRN
  Administered 2015-11-13: 5 mg via ORAL
  Filled 2015-11-13: qty 1

## 2015-11-13 SURGICAL SUPPLY — 11 items
CATH INFINITI 5FR MULTPACK ANG (CATHETERS) ×1 IMPLANT
CATH SWAN GANZ 7F STRAIGHT (CATHETERS) ×1 IMPLANT
GLIDESHEATH SLEND SS 6F .021 (SHEATH) ×1 IMPLANT
KIT HEART LEFT (KITS) ×2 IMPLANT
PACK CARDIAC CATHETERIZATION (CUSTOM PROCEDURE TRAY) ×2 IMPLANT
SHEATH FAST CATH BRACH 5F 5CM (SHEATH) ×1 IMPLANT
SHEATH PINNACLE 5F 10CM (SHEATH) ×1 IMPLANT
SHEATH PINNACLE 7F 10CM (SHEATH) ×1 IMPLANT
TRANSDUCER W/STOPCOCK (MISCELLANEOUS) ×3 IMPLANT
TUBING CIL FLEX 10 FLL-RA (TUBING) ×2 IMPLANT
WIRE EMERALD 3MM-J .035X150CM (WIRE) ×2 IMPLANT

## 2015-11-13 NOTE — Progress Notes (Signed)
Advanced Heart Failure Rounding Note   Subjective:    Diuresed 2L. Weight down 1 pound. Breathing better.     Objective:   Weight Range:  Vital Signs:   Temp:  [97.5 F (36.4 C)-97.8 F (36.6 C)] 97.7 F (36.5 C) (07/06 1135) Pulse Rate:  [72-78] 74 (07/06 1135) Resp:  [16-18] 18 (07/06 1135) BP: (115-126)/(46-71) 124/71 mmHg (07/06 1135) SpO2:  [94 %-99 %] 97 % (07/06 1135) Weight:  [191 lb (86.637 kg)] 191 lb (86.637 kg) (07/06 0516) Last BM Date: 11/11/15  Weight change: Filed Weights   11/12/15 0232 11/12/15 1609 11/13/15 0516  Weight: 192 lb 10.9 oz (87.4 kg) 191 lb 9.6 oz (86.909 kg) 191 lb (86.637 kg)    Intake/Output:   Intake/Output Summary (Last 24 hours) at 11/13/15 1634 Last data filed at 11/13/15 1300  Gross per 24 hour  Intake    480 ml  Output   1325 ml  Net   -845 ml     Physical Exam: General:  Elderly. No resp difficulty HEENT: normal Neck: supple. JVP 8. Carotids 2+ bilat; no bruits. No lymphadenopathy or thryomegaly appreciated. Cor: PMI nondisplaced. Irregular rate & rhythm. 2/6 MR, TR Lungs: clear decreased BS throughout Abdomen: soft, nontender, nondistended. No hepatosplenomegaly. No bruits or masses. Good bowel sounds. Extremities: no cyanosis, clubbing, rash, edema Neuro: alert & orientedx3, cranial nerves grossly intact. moves all 4 extremities w/o difficulty. Affect pleasant  ECG: sinus with v-pacing  Labs: Basic Metabolic Panel:  Recent Labs Lab 11/12/15 0411 11/13/15 0400  NA 142 142  K 4.2 3.3*  CL 108 108  CO2 24 25  GLUCOSE 224* 154*  BUN 38* 36*  CREATININE 2.08* 1.93*  CALCIUM 9.0 9.1  MG 2.1 1.9  PHOS 3.1 3.4    Liver Function Tests:  Recent Labs Lab 11/12/15 0411  AST 46*  ALT 53  ALKPHOS 111  BILITOT 1.1  PROT 5.5*  ALBUMIN 3.2*   No results for input(s): LIPASE, AMYLASE in the last 168 hours. No results for input(s): AMMONIA in the last 168 hours.  CBC:  Recent Labs Lab 11/12/15 0411  11/13/15 0400  WBC 6.0 6.0  NEUTROABS 4.7  --   HGB 13.3 12.8*  HCT 41.5 39.8  MCV 101.0* 100.0  PLT 151 144*    Cardiac Enzymes:  Recent Labs Lab 11/12/15 0411 11/12/15 0811 11/12/15 1437  TROPONINI 0.34* 0.34* 0.22*    BNP: BNP (last 3 results)  Recent Labs  04/15/15 1352 07/11/15 1205 11/12/15 0411  BNP 173.3* 240.2* 4293.7*    ProBNP (last 3 results) No results for input(s): PROBNP in the last 8760 hours.    Other results:  Imaging: Dg Chest Port 1 View  11/12/2015  CLINICAL DATA:  Follow-up pulmonary edema.  Subsequent encounter. EXAM: PORTABLE CHEST 1 VIEW COMPARISON:  Chest radiograph performed 11/11/2015 FINDINGS: Bilateral central airspace opacification, right greater than left, is improved from the prior study. This remains compatible with pulmonary edema. Small bilateral pleural effusions are suspected. No pneumothorax is seen. The cardiomediastinal silhouette is normal in size. A pacemaker/AICD is noted overlying the left chest wall, with leads ending overlying the right atrium, right ventricle and coronary sinus. No acute osseous abnormalities are seen. IMPRESSION: Interval improvement in bilateral central airspace opacification, right greater than left, reflecting improving pulmonary edema. Small bilateral pleural effusions suspected. Electronically Signed   By: Roanna Raider M.D.   On: 11/12/2015 03:21      Medications:  Scheduled Medications: . [MAR Hold] allopurinol  100 mg Oral Daily  . [MAR Hold] antiseptic oral rinse  7 mL Mouth Rinse BID  . [MAR Hold] aspirin EC  81 mg Oral Daily  . [MAR Hold] atorvastatin  20 mg Oral Daily  . [MAR Hold] clopidogrel  75 mg Oral Daily  . [MAR Hold] gabapentin  300 mg Oral QHS  . [MAR Hold] heparin  5,000 Units Subcutaneous Q8H  . [MAR Hold] isosorbide mononitrate  30 mg Oral Daily  . [MAR Hold] metoprolol succinate  200 mg Oral Daily  . [MAR Hold] pantoprazole  40 mg Oral Daily  . [MAR Hold]  sacubitril-valsartan  1 tablet Oral Daily  . [MAR Hold] sodium chloride flush  3 mL Intravenous Q12H     Infusions: . heparin       PRN Medications:  [MAR Hold] sodium chloride, [MAR Hold] sodium chloride, [MAR Hold] albuterol, heparin, [MAR Hold] ipratropium-albuterol, [MAR Hold] sodium chloride flush   Assessment:   1. Acute on chronic systolic HF, EF 20-25%  --s/p CRT-D 2. CKD III-IV 3. NSVT 4. Carotid stenosis > 70% on L  5. CAD 6. Hypokalemia  Plan/Discussion:     Volume status better. Renal function slightly improved. Will plan R & LHC today.   Dr. Allyson Sabal will see to help address left carotid stenosis  NSVT improved. Keep K .4.0 Mg > 2.0   Length of Stay: 1   Fleur Audino MD 11/13/2015, 4:34 PM  Advanced Heart Failure Team Pager [phone] (M-F; 7a - 4p)  Please contact [facility] for night-coverage after hours (4p -7a ) and weekends on [email or web address]

## 2015-11-13 NOTE — Interval H&P Note (Signed)
History and Physical Interval Note:  11/13/2015 4:33 PM  Scott Parsons  has presented today for surgery, with the diagnosis of heart failure  The various methods of treatment have been discussed with the patient and family. After consideration of risks, benefits and other options for treatment, the patient has consented to  Procedure(s): Right/Left Heart Cath and Coronary Angiography (N/A) and possible angioplasty as a surgical intervention .  The patient's history has been reviewed, patient examined, no change in status, stable for surgery.  I have reviewed the patient's chart and labs.  Questions were answered to the patient's satisfaction.    Cath Lab Visit (complete for each Cath Lab visit)  Clinical Evaluation Leading to the Procedure:   ACS: No.  Non-ACS:    Anginal Classification: CCS IV  Anti-ischemic medical therapy: Minimal Therapy (1 class of medications)  Non-Invasive Test Results: No non-invasive testing performed  Prior CABG: No previous CABG       Legrande Hao, Reuel Boom

## 2015-11-13 NOTE — Interval H&P Note (Signed)
History and Physical Interval Note:  11/13/2015 4:33 PM  Scott Parsons  has presented today for surgery, with the diagnosis of heart failure  The various methods of treatment have been discussed with the patient and family. After consideration of risks, benefits and other options for treatment, the patient has consented to  Procedure(s): Right/Left Heart Cath and Coronary Angiography (N/A) and possible angioplasty as a surgical intervention .  The patient's history has been reviewed, patient examined, no change in status, stable for surgery.  I have reviewed the patient's chart and labs.  Questions were answered to the patient's satisfaction.     Justo Hengel, Reuel Boom

## 2015-11-13 NOTE — Progress Notes (Signed)
[facility] Progress Note  Patient: Scott Parsons GMW:[id number]   PCP: [name], MD DOB: Dec 07, 1929   DOA: 11/12/2015   DOS: 11/13/2015   Date of Service: the patient was seen and examined on 11/13/2015  Subjective: she mentions his breathing is better. Denies any chest pain or abdominal pain. No nausea no vomiting. Occasional cough is present. No fever no chills. Nutrition: tolerating oral diet  Brief hospital course: Pt. with PMH of CAD, CK-MB, COPD, OSA, ICD, HTN, NICM, EF 20%; admitted on 11/12/2015, with complaint of shortness of breath, was found to have acute on chronic decompensated systolic dysfunction with CHF.she was started on BiPAP and was transferred to [facility]. With improvement in oxygenation the patient was transferred back to floor. Cardiology was consulted and plan is for right and left cardiac catheterization on 11/13/2015. Currently further plan is follow the results of the cardiac catheterization.  Assessment and Plan: 1. Acute on chronic combined systolic and diastolic congestive heart failure, NYHA class 4 (HCC) Improvement in weight as well as symptoms after IV diuresis. Continue IV Lasix. Right and left heart cath today. follow results and recommendation. Appreciate cardiology input.  2.peripheral vascular disease. Continue to closely monitor.  3. Acute on chronic hypoxic respiratory failure. From CHF acute No evidence of pneumonia. We will continue to closely monitor.  4. Dyslipidemia. Next and continue Lipitor.  Principal Problem:   Acute on chronic combined systolic and diastolic congestive heart failure, NYHA class 4 (HCC) Active Problems:   Hyperlipidemia   Carotid artery disease (HCC)   Renal artery stenosis (HCC)   Nonischemic cardiomyopathy (HCC)   Acute hypoxemic respiratory failure (HCC)   ICD (implantable cardioverter-defibrillator), dual, in situ   CKD (chronic kidney disease), stage IV (HCC)   PVD (peripheral vascular  disease) (HCC)   OSA (obstructive sleep apnea)   Hypertensive heart disease with CHF (congestive heart failure) (HCC)   Non-obstructive CAD   Elevated troponin   Pain management: when necessary Tylenol Activity: consulted physical therapy Bowel regimen: last BM 11/11/2015 Diet: cardiac DVT Prophylaxis: subcutaneous Heparin  Advance goals of care discussion: full code  Family Communication: no family was present at bedside, at the time of interview.   Disposition:  Discharge to home. Expected discharge date: 11/15/2015, improvement in oxygenation  Consultants: cardiology Procedures: right and left cardiac catheterization  Antibiotics: Anti-infectives    None        Intake/Output Summary (Last 24 hours) at 11/13/15 1811 Last data filed at 11/13/15 1300  Gross per 24 hour  Intake    480 ml  Output   1325 ml  Net   -845 ml   Filed Weights   11/12/15 0232 11/12/15 1609 11/13/15 0516  Weight: 87.4 kg (192 lb 10.9 oz) 86.909 kg (191 lb 9.6 oz) 86.637 kg (191 lb)    Objective: Physical Exam: Filed Vitals:   11/13/15 1729 11/13/15 1734 11/13/15 1739 11/13/15 1801  BP: 127/61 124/60 118/67 114/49  Pulse: 57 57 57 57  Temp:    98.1 F (36.7 C)  TempSrc:    Oral  Resp: 15 12 13 18   Height:      Weight:      SpO2: 91% 91% 91% 95%    General: Alert, Awake and Oriented to Time, Place and Person. Appear in mild distress Eyes: PERRL, Conjunctiva normal ENT: Oral Mucosa clear moist. Neck: positive JVD, no Abnormal Mass Or lumps Cardiovascular: S1 and S2 Present, aortic systolic Murmur, Respiratory: Bilateral Air entry equal and Decreased,  basal Crackles, no wheezes Abdomen: Bowel Sound present, Soft and no tenderness Skin: no redness, no Rash  Extremities: bilateral Pedal edema, no calf tenderness Neurologic: Grossly no focal neuro deficit. Bilaterally Equal motor strength   Data Reviewed: CBC:  Recent Labs Lab 11/12/15 0411 11/13/15 0400  WBC 6.0 6.0    NEUTROABS 4.7  --   HGB 13.3 12.8*  HCT 41.5 39.8  MCV 101.0* 100.0  PLT 151 144*   Basic Metabolic Panel:  Recent Labs Lab 11/12/15 0411 11/13/15 0400  NA 142 142  K 4.2 3.3*  CL 108 108  CO2 24 25  GLUCOSE 224* 154*  BUN 38* 36*  CREATININE 2.08* 1.93*  CALCIUM 9.0 9.1  MG 2.1 1.9  PHOS 3.1 3.4    Liver Function Tests:  Recent Labs Lab 11/12/15 0411  AST 46*  ALT 53  ALKPHOS 111  BILITOT 1.1  PROT 5.5*  ALBUMIN 3.2*   No results for input(s): LIPASE, AMYLASE in the last 168 hours. No results for input(s): AMMONIA in the last 168 hours. Coagulation Profile:  Recent Labs Lab 11/12/15 0411  INR 1.18   Cardiac Enzymes:  Recent Labs Lab 11/12/15 0411 11/12/15 0811 11/12/15 1437  TROPONINI 0.34* 0.34* 0.22*   BNP (last 3 results) No results for input(s): PROBNP in the last 8760 hours.  CBG: No results for input(s): GLUCAP in the last 168 hours.  Studies: No results found.   Scheduled Meds: . allopurinol  100 mg Oral Daily  . antiseptic oral rinse  7 mL Mouth Rinse BID  . aspirin EC  81 mg Oral Daily  . atorvastatin  20 mg Oral Daily  . clopidogrel  75 mg Oral Daily  . gabapentin  300 mg Oral QHS  . heparin  5,000 Units Subcutaneous Q8H  . isosorbide mononitrate  30 mg Oral Daily  . metoprolol succinate  200 mg Oral Daily  . pantoprazole  40 mg Oral Daily  . sacubitril-valsartan  1 tablet Oral Daily  . sodium chloride flush  3 mL Intravenous Q12H  . sodium chloride flush  3 mL Intravenous Q12H   Continuous Infusions: . sodium chloride     PRN Meds: sodium chloride, sodium chloride, sodium chloride, acetaminophen, albuterol, ipratropium-albuterol, ondansetron (ZOFRAN) IV, sodium chloride flush, sodium chloride flush  Time spent: 30 minutes  Author: Lynden Oxford, MD [facility] Pager: [phone] 11/13/2015 6:11 PM  If 7PM-7AM, please contact night-coverage at [email or web address], password [facility]

## 2015-11-13 NOTE — H&P (View-Only) (Signed)
Cardiology Consult    Patient ID: AIDDEN ARBUCKLE MRN: [id number], DOB/AGE: 80/13/1931   Admit date: 11/12/2015 Date of Consult: 11/12/2015  Primary Physician: [name], MD Primary Cardiologist:  Erlene Quan, MD (PV); Marlyne Beards, MD [facility] Requesting Provider: D. Tyson Alias  Patient Profile    82 y/o ? with a h /o NICM, chronic combined CHF, LBBB s/p biV ICD, CKD III - IV, HTN, PAD, Carotid dzs, and RAS s/p renal stenting, who presents on tx from [facility] 2/2 dyspnea and CHF.  Past Medical History   Past Medical History  Diagnosis Date  . Non-obstructive CAD     a. 11/2001 [facility]): LM nl, LAD 78m, LCX nondom, nl, RI large, nl, RCA dominant, 21m/d.  [name] Hypertensive heart disease with CHF (congestive heart failure) (HCC)   . Hyperlipidemia   . OSA (obstructive sleep apnea)     a. Does not tolerate/use CPAP.  [name] PVD (peripheral vascular disease) (HCC)     a. 10/2005 PTA: right external iliac artery angioplasty and left renal artery angioplasty and stenting;  b. 10/2015 ABI's in setting of recurrent claudication: R 0.97, L 0.77 (reduced by 0.12 from prior study).  . Renal artery stenosis (HCC)     a. 10/2005 s/p L renal artery stenting.  [name] History of stroke   . Carotid artery disease (HCC)     a. 04/2015 mild to mod bilat ICA stenosis;  b. 10/2015 >70% LICA stenosis, no significant dzs on right.  . Nonischemic cardiomyopathy (HCC)     a. 11/2001 nonobs cath; b. 09/2013 s/p MDT Ovidio Kin XT CRT-D (ser #[id number]); c. 04/2014 Echo: EF 20-25%;  d. Followed @ [facility] by K. Pernell Dupre, MD.  . Chronic combined systolic and diastolic CHF, NYHA class 4 (HCC)     a. 01/2013 Echo: EF 25-30%; b. 08/2013 Echo: EF 15-20%; c. 04/2014 Echo: EF 20-25%, mild LVH, Gr1 DD, mild MR, mod dil LA.  . LBBB (left bundle branch block)   . TIA (transient ischemic attack) 06/2010  . Pre-diabetes   . ICD (implantable cardioverter-defibrillator), dual, in situ     a. 09/2013 s/p MDT Ovidio Kin XT CRT-D  (ser #[id number]).  . CKD (chronic kidney disease), stage IV (HCC)     a. baseline creat of ~ 1.8;  b. 09/2015 Admitted to [facility] with AKI and hyperkalemia.    Past Surgical History  Procedure Laterality Date  . Cardiac catheterization  2003    Mild LV dysfuntion. he had apical and inferoapical wall motion abnormalities and EF of 45%.  . Cardiac catheterization  10/18/2005    STENTS: left renal artery as well as both iliac arteries. Was stented with 62mm x 46mm Smart Nitinol self-expanding stent replaced with a 78mm x 61mm balloon at nominal pressures, resulting in a reduction od 50% proximal right external iliac artery  stenosis to 0% residual.  . Hand surgery  01/1995  . Hernia repair    . Bi-ventricular implantable cardioverter defibrillator  (crt-d)  09-13-2013    MDT CRTD implanted by Dr Johney Frame  . Bi-ventricular implantable cardioverter defibrillator N/A 09/13/2013    Procedure: BI-VENTRICULAR IMPLANTABLE CARDIOVERTER DEFIBRILLATOR  (CRT-D);  Surgeon: Gardiner Rhyme, MD;  Location: [facility] CATH LAB;  Service: Cardiovascular;  Laterality: N/A;     Allergies  Allergies  Allergen Reactions  . Amoxicillin Itching    Itching    . Biaxin [Clarithromycin] Itching    Lip swelling     . Erythromycin Itching  . Penicillins  Itching  . Shellfish Allergy Itching        . Sulfur     History of Present Illness    82 y/o ? with a h/o NICM s/p cath in 2003 revealing non-obs dzs.  EF has hovered around 25% since then, and he underwent CRT-D in 09/2013 in the setting of LBBB.  He also has a h/o HTN, HL, pre-diabetes, PVD s/p bilat Iliac stenting in 6/07, RAS s/p L RA stenting in 6/07, and carotid arterial dzs (>70% LICA stenosis 10/2015 u/s).  He is followed closely in the [facility] CHF clinic by Devonne Doughty and in May, was found to have worsening of renal fxn along with hyperkalemia.  He was admitted to [facility] and his lisinopril, valsartan, and spironolactone were discontinued.  Labs were stable upon f/u and he  was subsequently placed on entresto.  He was last seen in CHF clinic on 6/5.  He was felt to have mild volume overload @ that time and was advised to increase his lasix for three days.  At the time, he was noting some DOE above baseline.  This did improve.  He saw Dr. Allyson Sabal on 6/14 and was felt to be doing well with the exception of worsening lower ext claudication.  F/u ABI has shown an ABI of 0.77 on the left and he has f/u with Dr. Allyson Sabal scheduled on 7/7 to discuss.  Beginning about two days ago, he began to note some worsening in ex tolerance and also developed a cough.  Remote monitoring of thoracic impedance showed reduced impedance on 6/26, and he was advised to increase his lasix x 3 days.  His wt, which he says runs between 193 and 196 on his home scale had been up slightly, but he did not think it was significant.  With doubling of lasix, he did lose 1.6 lbs, though DOE hadn't changed much.  Thoracic impedance remained below prior baseline but improved when evaluated on 6/30.    On 7/4, he just didn't feel well.  He worked in the yard some and noted malaise and DOE.  He didn't have much of an appetite and only ate a tomato sandwich for dinner (no salt added).  After dinner, around 9 pm, he noted that his dyspnea was worsening w/o provocation.  His wife called EMS and he was taken to the [facility] ER.  There, O2 sats were in the upper 80's and he required NRB  bipap, which he did not tolerate.  CXR showed CHF and he was treated with lasix 80 IV. He was tx to [facility] for further eval. Here, he was admitted by CCM.  With diuresis he has had significant symptomatic improvement and is minus 1.48L thus far.  Wt recorded @ 192 lbs. Has had freq pvc and brief runs of asymptomatic nsvt on tele.  We've been asked to eval and assume care.  Of note, his grand daughter mentioned to nursing staff that he frequently eats cheetos and cheese garlic [place] toast.  Inpatient Medications    . [START ON 11/13/2015] allopurinol   100 mg Oral Daily  . antiseptic oral rinse  7 mL Mouth Rinse BID  . aspirin EC  81 mg Oral Daily  . atorvastatin  20 mg Oral Daily  . clopidogrel  75 mg Oral Daily  . furosemide  40 mg Intravenous Q12H  . gabapentin  300 mg Oral QHS  . heparin  5,000 Units Subcutaneous Q8H  . ipratropium-albuterol  3 mL Nebulization Q6H  .  isosorbide mononitrate  30 mg Oral Daily  . metoprolol succinate  200 mg Oral Daily  . pantoprazole  40 mg Oral Daily  . sacubitril-valsartan  1 tablet Oral Daily  . sodium chloride flush  3 mL Intravenous Q12H    Family History    Family History  Problem Relation Age of Onset  . Alzheimer's disease Mother   . Heart disease Father   . Heart disease Paternal Grandfather   . Heart attack Paternal Grandfather     Social History    Social History   Social History  . Marital Status: Married    Spouse Name: N/A  . Number of Children: N/A  . Years of Education: N/A   Occupational History  . Not on file.   Social History Main Topics  . Smoking status: Former Smoker -- 52 years    Types: Cigarettes    Quit date: 08/11/2004  . Smokeless tobacco: Never Used  . Alcohol Use: No  . Drug Use: No  . Sexual Activity: Not on file   Other Topics Concern  . Not on file   Social History Narrative   Retired.  Lives in [place] with his wife.  Tries to remain active.     Review of Systems    General:  No chills, fever, night sweats or weight changes.  Cardiovascular:  No chest pain, +++ dyspnea on exertion, +++ trace ankle edema above sockline a few days ago, no orthopnea, palpitations, paroxysmal nocturnal dyspnea. Dermatological: No rash, lesions/masses Respiratory: +++ non-productive cough x 2 wks, +++ dyspnea Urologic: No hematuria, dysuria Abdominal:   No nausea, vomiting, diarrhea, bright red blood per rectum, melena, or hematemesis Neurologic:  No visual changes, wkns, changes in mental status. All other systems reviewed and are otherwise negative  except as noted above.  Physical Exam    Blood pressure 136/65, pulse 87, temperature 97.6 F (36.4 C), temperature source Oral, resp. rate 24, height 6\' 2"  (1.88 m), weight 192 lb 10.9 oz (87.4 kg), SpO2 95 %.  General: Pleasant, NAD Psych: Normal affect. Neuro: Alert and oriented X 3. Moves all extremities spontaneously. HEENT: Normal  Neck: Supple.  Soft bilat carotid bruits. JVP to jaw. Lungs:  Resp regular and unlabored, bibasilar crackles. Heart: Irreg, distant, + s3, no s4, 2/6 Syst murmur @ llsb. Abdomen: Soft, non-tender, non-distended, BS + x 4.  Extremities: No clubbing, cyanosis or edema. DP/PT/Radials 2+ and equal bilaterally.  Labs     Recent Labs  11/12/15 0411 11/12/15 0811  TROPONINI 0.34* 0.34*   Lab Results  Component Value Date   WBC 6.0 11/12/2015   HGB 13.3 11/12/2015   HCT 41.5 11/12/2015   MCV 101.0* 11/12/2015   PLT 151 11/12/2015     Recent Labs Lab 11/12/15 0411  NA 142  K 4.2  CL 108  CO2 24  BUN 38*  CREATININE 2.08*  CALCIUM 9.0  PROT 5.5*  BILITOT 1.1  ALKPHOS 111  ALT 53  AST 46*  GLUCOSE 224*    Radiology Studies    CXR 11/11/15 [facility])  IMPRESSION: Diffuse bilateral airspace opacification, right greater than left. This may reflect pulmonary edema or possibly pneumonia. Underlying ARDS cannot be excluded.  Dg Chest Port 1 View  11/12/2015  CLINICAL DATA:  Follow-up pulmonary edema.  Subsequent encounter. EXAM: PORTABLE CHEST 1 VIEW COMPARISON:  Chest radiograph performed 11/11/2015 FINDINGS: Bilateral central airspace opacification, right greater than left, is improved from the prior study. This remains compatible with pulmonary edema.  Small bilateral pleural effusions are suspected. No pneumothorax is seen. The cardiomediastinal silhouette is normal in size. A pacemaker/AICD is noted overlying the left chest wall, with leads ending overlying the right atrium, right ventricle and coronary sinus. No acute osseous  abnormalities are seen. IMPRESSION: Interval improvement in bilateral central airspace opacification, right greater than left, reflecting improving pulmonary edema. Small bilateral pleural effusions suspected. Electronically Signed   By: Roanna Raider M.D.   On: 11/12/2015 03:21    ECG & Cardiac Imaging    A sensed, V paced, 115, pvc's, LBBB.  Assessment & Plan    1.  Acute on chronic combined systolic and diastolic CHF/NICM:  H/o NICM dating back at least to 2003 (he says first admission was in the 90's though).  Last EF by echo in 04/2014 was 20-25%.  He is s/p CRT-D in 09/2013.  Since early June, he has been having some issues with volume and required a few days of additional lasix in early June and then again in late June in the setting of reduced thoracic impedances noted on remote device transmissions.  He has had a non-productive cough and some reduction in exercise tolerance over the past 2 wks but says that his weight was still trending between 193 and 196.  Family indicates that he isn't always compliant with diet and eats salty snacks.  Dyspnea worsened on 7/4, prompting presentation to [facility] where he was found to be hypoxic with CHF on cxr.  He was tx to [facility] for further eval.  With diuresis, he has had significant symptomatic improvement.  Wt down to 192 lbs.  Still with evidence of volume overload on exam  crackles, JVD.  BUN/Creat 37/1.9 upon presentation, currently 38/2.08.  Continue diuresis,  blocker, entresto, and nitrate therapy.  With resting HR of high 70's to 80, could consider ivabradine.  2.  Troponin elevation:  Trop 0.34 x 2.  Likely represents demand ischemia in setting of #1.  Cont asa, statin,  blocker.  No chest pain.  He has a h/o nonobs CAD by cath in 2003 with moderate LAD and RCA dzs.  ? Potential role of ischemia/progression of CAD in current decompensation.  Consider ischemic eval, understanding that his CKD may make him a poor cath candidate.  3.   Hypertensive Heart Dzs w/CHF:  BP stable on  blocker, nitrate, and entresto.  4.  HL:  Cont statin therapy.  5.  CKD IV:  Creat up slightly above baseline.  Follow with diuresis.  Required admission for AKI and hyperkalemia in May @ [facility].  6.  NSVT:  Noted to have frequent pvc's and runs of nsvt on tele.  K/Mg ok.  Cont  blocker.  Interrogate device to determine PVC burden - ? Role in worsened CHF.  Will need ischemic eval.  7.  PAD:  He has been having some worsening of bilat LE claudication.  Recent ABI showed worsening on left  0.77.  He has outpt f/u scheduled with Dr. Allyson Sabal.  8.  Carotid Arterial Dzs:  Recent u/s in June showed > 70% LICA stenosis.  This was performed after TIA like symptoms.  F/u planned for 6 mos.  Cont asa/plavix/statin.  Signed, Nicolasa Ducking, NP 11/12/2015, 2:10 PM   Patient seen and examined independently. Gilford Raid, NP note reviewed carefully - agree with his assessment and plan. I have edited the note based on my findings.   Mr. Noda has a long h/o CHF due to previous NICM with only  mild to moderate CAD on cath in 2003. Despite his EF 20-25% he has done reasonably well until about 2 months ago when his function class really declined. He now has NYHA IIIb symptoms. Over that same period of time he has also struggled with volume management with numerous ups and downs on his Optivol interrogation. He was admitted with severe volume overload and respiratory failure. On exam his volume status is improving but he has prominent s3 and significant MR and I worry about low output HF. Since his last cath he has also developed severe PAD and significant carotid disease so I do worry about the possibility of progressive CAD adding to his cardiomyopathy and I feel he needs an ischemic evaluation.   At this point would repeat echo and continue diuresis. Will check renal function in am if creatinine better will consider R and L cath. If worse of no change, will plan RHC  alone with possible Lexiscan.   I have discussed with Dr. Allyson Sabal who will see him as well tomorrow and discuss the plan with him and also address his carotid stenosis.   ICD interrogation done personally. No VT/AF. PVCs about 5% of time. 100% biV pacing.   Aasim Restivo,MD 6:02 PM

## 2015-11-13 NOTE — Progress Notes (Signed)
  Echocardiogram 2D Echocardiogram has been performed.  Arvil Chaco 11/13/2015, 10:09 AM

## 2015-11-14 ENCOUNTER — Ambulatory Visit: Payer: Medicare Other | Admitting: Physician Assistant

## 2015-11-14 ENCOUNTER — Encounter (HOSPITAL_COMMUNITY): Payer: Self-pay | Admitting: Internal Medicine

## 2015-11-14 DIAGNOSIS — I429 Cardiomyopathy, unspecified: Secondary | ICD-10-CM

## 2015-11-14 DIAGNOSIS — G4733 Obstructive sleep apnea (adult) (pediatric): Secondary | ICD-10-CM

## 2015-11-14 LAB — CBC WITH DIFFERENTIAL/PLATELET
BASOS ABS: 0 10*3/uL (ref 0.0–0.1)
BASOS PCT: 0 %
EOS ABS: 0.2 10*3/uL (ref 0.0–0.7)
EOS PCT: 3 %
HCT: 39.8 % (ref 39.0–52.0)
Hemoglobin: 13 g/dL (ref 13.0–17.0)
Lymphocytes Relative: 18 %
Lymphs Abs: 1.1 10*3/uL (ref 0.7–4.0)
MCH: 32.8 pg (ref 26.0–34.0)
MCHC: 32.7 g/dL (ref 30.0–36.0)
MCV: 100.5 fL — ABNORMAL HIGH (ref 78.0–100.0)
Monocytes Absolute: 0.7 10*3/uL (ref 0.1–1.0)
Monocytes Relative: 12 %
Neutro Abs: 4 10*3/uL (ref 1.7–7.7)
Neutrophils Relative %: 67 %
PLATELETS: 133 10*3/uL — AB (ref 150–400)
RBC: 3.96 MIL/uL — AB (ref 4.22–5.81)
RDW: 13.9 % (ref 11.5–15.5)
WBC: 6 10*3/uL (ref 4.0–10.5)

## 2015-11-14 LAB — COMPREHENSIVE METABOLIC PANEL
ALBUMIN: 3.3 g/dL — AB (ref 3.5–5.0)
ALT: 38 U/L (ref 17–63)
AST: 28 U/L (ref 15–41)
Alkaline Phosphatase: 96 U/L (ref 38–126)
Anion gap: 6 (ref 5–15)
BUN: 34 mg/dL — AB (ref 6–20)
CHLORIDE: 110 mmol/L (ref 101–111)
CO2: 25 mmol/L (ref 22–32)
CREATININE: 1.76 mg/dL — AB (ref 0.61–1.24)
Calcium: 8.9 mg/dL (ref 8.9–10.3)
GFR calc non Af Amer: 34 mL/min — ABNORMAL LOW (ref >60)
GFR, EST AFRICAN AMERICAN: 39 mL/min — AB (ref >60)
Glucose, Bld: 171 mg/dL — ABNORMAL HIGH (ref 65–99)
Potassium: 3.8 mmol/L (ref 3.5–5.1)
SODIUM: 141 mmol/L (ref 135–145)
Total Bilirubin: 1.3 mg/dL — ABNORMAL HIGH (ref 0.3–1.2)
Total Protein: 5.4 g/dL — ABNORMAL LOW (ref 6.5–8.1)

## 2015-11-14 LAB — MAGNESIUM: Magnesium: 2.1 mg/dL (ref 1.7–2.4)

## 2015-11-14 MED ORDER — POTASSIUM CHLORIDE CRYS ER 20 MEQ PO TBCR: 20.0000 meq | EXTENDED_RELEASE_TABLET | Freq: Every day | ORAL | Status: DC

## 2015-11-14 MED ORDER — POLYETHYLENE GLYCOL 3350 17 G PO PACK: 17.0000 g | PACK | Freq: Every day | ORAL | Status: DC

## 2015-11-14 MED ORDER — POLYETHYLENE GLYCOL 3350 17 G PO PACK
17.0000 g | PACK | Freq: Every day | ORAL | Status: DC
  Filled 2015-11-14: qty 1

## 2015-11-14 MED ORDER — PANTOPRAZOLE SODIUM 40 MG PO TBEC
40.0000 mg | DELAYED_RELEASE_TABLET | Freq: Every day | ORAL | Status: DC
Start: 2015-11-14 — End: 2016-01-01

## 2015-11-14 MED ORDER — FUROSEMIDE 40 MG PO TABS: 40.0000 mg | ORAL_TABLET | Freq: Two times a day (BID) | ORAL | Status: DC

## 2015-11-14 MED ORDER — POTASSIUM CHLORIDE CRYS ER 20 MEQ PO TBCR
40.0000 meq | EXTENDED_RELEASE_TABLET | Freq: Once | ORAL | Status: AC
  Administered 2015-11-14: 40 meq via ORAL
  Filled 2015-11-14: qty 2

## 2015-11-14 NOTE — Evaluation (Signed)
Physical Therapy Evaluation Patient Details Name: Scott Parsons MRN: [id number] DOB: 06/22/1929 Today's Date: 11/14/2015   History of Present Illness  Pt. with PMH of CAD, CK-MB, COPD, OSA, ICD, HTN, NICM, EF 20%, on oxygen at night.; admitted on 11/12/2015, with complaint of shortness of breath, was found to have acute on chronic decompensated systolic dysfunction with CHF. required on BiPAP and was transferred to [facility]. S/P cardiac cath 11/13/15.  Clinical Impression  The patient ambulated x 340' on RA with sats > 97% and HR max 97. Balance on level surfaces WFL. No further PT needs.  Refer to Oxygen saturation Qualification note.    Follow Up Recommendations No PT follow up    Equipment Recommendations  None recommended by PT    Recommendations for Other Services       Precautions / Restrictions Precautions Precautions: Fall Precaution Comments: monitor sats, HR Restrictions Weight Bearing Restrictions: No      Mobility  Bed Mobility Overal bed mobility: Independent                Transfers Overall transfer level: Independent                  Ambulation/Gait Ambulation/Gait assistance: Independent Ambulation Distance (Feet): 340 Feet Assistive device: None Gait Pattern/deviations: Step-through pattern;WFL(Within Functional Limits) Gait velocity: quired for ambulation.   General Gait Details: patient had gotten up ad lib to bathroom when PT went to get equipment. sat and rested a minute, then ambulated the distance on RA. Sats >97%. HR max 97%  Stairs            Wheelchair Mobility    Modified Rankin (Stroke Patients Only)       Balance Overall balance assessment: No apparent balance deficits (not formally assessed)                                           Pertinent Vitals/Pain Pain Assessment: No/denies pain    Home Living Family/patient expects to be discharged to:: Private residence Living  Arrangements: Spouse/significant other Available Help at Discharge: Family Type of Home: House Home Access: Stairs to enter     Home Layout: One level Home Equipment: Cane - single point      Prior Function Level of Independence: Needs assistance   Gait / Transfers Assistance Needed: independent, limited due to PVD per patient to couple hundred yards.  ADL's / Homemaking Assistance Needed: gets assist with dressing sometimes        Hand Dominance        Extremity/Trunk Assessment   Upper Extremity Assessment: Overall WFL for tasks assessed           Lower Extremity Assessment: Overall WFL for tasks assessed      Cervical / Trunk Assessment: Kyphotic  Communication      Cognition Arousal/Alertness: Awake/alert Behavior During Therapy: WFL for tasks assessed/performed Overall Cognitive Status: Within Functional Limits for tasks assessed                      General Comments      Exercises        Assessment/Plan    PT Assessment Patent does not need any further PT services  PT Diagnosis Difficulty walking   PT Problem List    PT Treatment Interventions     PT Goals (Current goals can be  found in the Care Plan section) Acute Rehab PT Goals Patient Stated Goal: to go home PT Goal Formulation: All assessment and education complete, DC therapy    Frequency     Barriers to discharge        Co-evaluation               End of Session Equipment Utilized During Treatment: Gait belt Activity Tolerance: Patient tolerated treatment well Patient left: in bed;with call bell/phone within reach;with family/visitor present Nurse Communication: Mobility status         Time: 1610-9604 PT Time Calculation (min) (ACUTE ONLY): 22 min   Charges:   PT Evaluation $PT Eval Low Complexity: 1 Procedure     PT G CodesRada Hay 11/14/2015, 3:34 PM  Blanchard Kelch PT [phone]

## 2015-11-14 NOTE — Progress Notes (Signed)
Advanced Heart Failure Rounding Note   Subjective:    Out 500 cc and down 1 lb. Feels good this morning.  Would like to go home. Denies SOB, CP, lightheadedness, or dizziness   R/LHC 11/13/15   Mild non-obstructive CAD, and well compensated hemodynamics after diuresis.  RA = 3 RV = 36/3 PA = 34/12 (20) PCW = 12 Ao = 118/48 (72) LV = 129/18 Fick cardiac output/index = 5.6/2.6 PVR = 1.4 WU SVR = 984 FA sat = 87% PA sat = 58%, 55%  Echo 11/13/15  LVEF 20-25% with diffuse hypokinesis. Mild AS, Moderate MR, Severe LAE  Objective:   Weight Range:  Vital Signs:   Temp:  [97.5 F (36.4 C)-98.1 F (36.7 C)] 97.5 F (36.4 C) (07/07 0616) Pulse Rate:  [0-93] 67 (07/07 0616) Resp:  [12-54] 18 (07/07 0616) BP: (86-137)/(34-71) 115/56 mmHg (07/07 0616) SpO2:  [0 %-98 %] 95 % (07/07 0616) Weight:  [190 lb 6.4 oz (86.365 kg)] 190 lb 6.4 oz (86.365 kg) (07/07 0616) Last BM Date: 11/11/15  Weight change: Filed Weights   11/12/15 1609 11/13/15 0516 11/14/15 0616  Weight: 191 lb 9.6 oz (86.909 kg) 191 lb (86.637 kg) 190 lb 6.4 oz (86.365 kg)    Intake/Output:   Intake/Output Summary (Last 24 hours) at 11/14/15 0957 Last data filed at 11/14/15 0846  Gross per 24 hour  Intake    240 ml  Output      0 ml  Net    240 ml     Physical Exam: General:  Elderly. No resp difficulty HEENT: normal Neck: supple. JVP flat. Carotids 2+ bilat; no bruits. No lymphadenopathy or thryomegaly appreciated. Cor: PMI nondisplaced. Irregular rate & rhythm. 2/6 MR, TR Lungs: Mildly diminished throughout, although clear.  Abdomen: soft, NT, ND, no HSM. No bruits or masses. +BS  Extremities: no cyanosis, clubbing, rash, edema Neuro: alert & orientedx3, cranial nerves grossly intact. moves all 4 extremities w/o difficulty. Affect pleasant  Labs: Basic Metabolic Panel:  Recent Labs Lab 11/12/15 0411 11/13/15 0400 11/13/15 1828 11/14/15 0446  NA 142 142  --  141  K 4.2 3.3*  --  3.8  CL  108 108  --  110  CO2 24 25  --  25  GLUCOSE 224* 154*  --  171*  BUN 38* 36*  --  34*  CREATININE 2.08* 1.93* 1.78* 1.76*  CALCIUM 9.0 9.1  --  8.9  MG 2.1 1.9  --  2.1  PHOS 3.1 3.4  --   --     Liver Function Tests:  Recent Labs Lab 11/12/15 0411 11/14/15 0446  AST 46* 28  ALT 53 38  ALKPHOS 111 96  BILITOT 1.1 1.3*  PROT 5.5* 5.4*  ALBUMIN 3.2* 3.3*   No results for input(s): LIPASE, AMYLASE in the last 168 hours. No results for input(s): AMMONIA in the last 168 hours.  CBC:  Recent Labs Lab 11/12/15 0411 11/13/15 0400 11/13/15 1828 11/14/15 0446  WBC 6.0 6.0 6.0 6.0  NEUTROABS 4.7  --   --  4.0  HGB 13.3 12.8* 13.5 13.0  HCT 41.5 39.8 40.8 39.8  MCV 101.0* 100.0 99.5 100.5*  PLT 151 144* 135* 133*    Cardiac Enzymes:  Recent Labs Lab 11/12/15 0411 11/12/15 0811 11/12/15 1437  TROPONINI 0.34* 0.34* 0.22*    BNP: BNP (last 3 results)  Recent Labs  04/15/15 1352 07/11/15 1205 11/12/15 0411  BNP 173.3* 240.2* 4293.7*    ProBNP (  last 3 results) No results for input(s): PROBNP in the last 8760 hours.    Other results:  Imaging: No results found.   Medications:     Scheduled Medications: . allopurinol  100 mg Oral Daily  . antiseptic oral rinse  7 mL Mouth Rinse BID  . aspirin EC  81 mg Oral Daily  . atorvastatin  20 mg Oral Daily  . clopidogrel  75 mg Oral Daily  . gabapentin  300 mg Oral QHS  . heparin  5,000 Units Subcutaneous Q8H  . isosorbide mononitrate  30 mg Oral Daily  . metoprolol succinate  200 mg Oral Daily  . pantoprazole  40 mg Oral Daily  . potassium chloride  40 mEq Oral Once  . sacubitril-valsartan  1 tablet Oral Daily  . sodium chloride flush  3 mL Intravenous Q12H  . sodium chloride flush  3 mL Intravenous Q12H    Infusions:    PRN Medications: sodium chloride, sodium chloride, sodium chloride, acetaminophen, albuterol, ipratropium-albuterol, ondansetron (ZOFRAN) IV, sodium chloride flush, sodium  chloride flush, zolpidem   Assessment:   1. Acute on chronic systolic HF, EF 20-25%  --s/p CRT-D 2. CKD III-IV 3. NSVT 4. Carotid stenosis > 70% on L  5. CAD 6. Hypokalemia  Plan/Discussion:    Volume status stable. Renal function stable to improved.   Dr. Allyson Sabal will see as outpatient to help address left carotid stenosis  NSVT improved. Goal K > 4.0 Mg > 2.0. K 3.8 and Mg 2.1 this am.   HF meds for home Lasix 40 mg BID Potassium 20 meq daily ASA 81 mg daily Clopidigrel 75 mg daily Imdur 30 mg daily Toprol-XL 200 mg daily Entresto 24/26 mg BID  Will schedule for follow up with labs next week in HF clinic.   Length of Stay: 2   Graciella Freer PA-C 11/14/2015, 9:57 AM  Advanced Heart Failure Team Pager [phone] (M-F; 7a - 4p)  Please contact [facility] for night-coverage after hours (4p -7a ) and weekends on [email or web address]  Patient seen and examined with Otilio Saber, PA-C. We discussed all aspects of the encounter. I agree with the assessment and plan as stated above.   Cath results reviewed with him and his family. Stressed need to watch fluid and salt intake. Increase lasix to 40 bid. F/u in HF Clinic. D/w Dr. Allena Katz. F/u with Dr. Allyson Sabal for carotid stenosis. Ok to go home today.   Bensimhon, Daniel,MD 4:04 PM

## 2015-11-14 NOTE — Progress Notes (Signed)
11/14/15 SATURATION QUALIFICATIONS: (This note is used to comply with regulatory documentation for home oxygen)  Patient Saturations on Room Air at Rest = 100%  Patient Saturations on Room Air while Ambulating = 97-100%  Patient Saturations on  Liters of oxygen while Ambulating = %--not tested due to RA was sufficient.  Please briefly explain why patient needs home oxygen:The patient  Ambulated on RA with saturation > 97%. Patient reports using oxygen at night PTA. Blanchard Kelch PT [phone]

## 2015-11-14 NOTE — Progress Notes (Signed)
Pt had 11 beats of VT. Pt asymptomatic, VSS. Paged CHF PA. Will continue to monitor.

## 2015-11-14 NOTE — Care Management Note (Signed)
Case Management Note  Patient Details  Name: Scott Parsons MRN: [id number] Date of Birth: [birth year]  Subjective/Objective:       Admitted with CHF             Action/Plan: Lives at home with spouse, remains independent of his ADL's; has home oxygen that he use at night for the past 15 yrs; PCP is Dr [name] [facility]; private insurance with BCBS with prescription drug coverage; pharmacy of choice is [facility]; patient reports no problem getting his medication. He has scales at home and weighs himself daily; Patient stated that he is trying to eat a low sodium diet at home. No needs identified at this time.  Expected Discharge Date:    11/14/2015              Expected Discharge Plan:  Home/Self Care  Discharge planning Services  CM Consult Choice offered to:  Patient  Status of Service:  Completed, signed off  Reola Mosher [phone] 11/14/2015, 4:14 PM

## 2015-11-14 NOTE — Progress Notes (Signed)
Pt has orders to be discharged. Discharge instructions given and pt has no additional questions at this time. Medication regimen reviewed and pt educated. Pt verbalized understanding and has no additional questions. Telemetry box removed. IV removed and site in good condition. Pt stable and leaving by wheechair with wife and daugter.   Wynona Neat RN

## 2015-11-14 NOTE — Progress Notes (Signed)
[facility] Progress Note  Patient: Scott Parsons YSA:[id number]   PCP: [name], MD DOB: January 09, 1930   DOA: 11/12/2015   DOS: 11/14/2015   Date of Service: the patient was seen and examined on 11/14/2015  Subjective: Patient mentions his breathing is the best in the last 1 month. Denies any chest pain or abdominal pain. Nausea no vomiting. Nutrition: tolerating oral diet  Brief hospital course: Pt. with PMH of CAD, CK-MB, COPD, OSA, ICD, HTN, NICM, EF 20%; admitted on 11/12/2015, with complaint of shortness of breath, was found to have acute on chronic decompensated systolic dysfunction with CHF.she was started on BiPAP and was transferred to [facility]. With improvement in oxygenation the patient was transferred back to floor. Cardiology was consulted and plan is for right and left cardiac catheterization on 11/13/2015. Currently further plan is to get diuresis and monitor physical therapy recommendation.  Assessment and Plan: 1. Acute on chronic combined systolic and diastolic congestive heart failure, NYHA class 4 (HCC) Improvement in weight as well as symptoms after IV diuresis. Continue IV Lasix. Right and left heart cath shows no significant coronary artery disease. Appreciate cardiology input.  2.peripheral vascular disease. Continue to closely monitor. Patient will follow up with Dr. Allyson Sabal for carotid artery stenosis  3. Acute on chronic hypoxic respiratory failure. From acute on chronic CHF No evidence of pneumonia. We will continue to closely monitor. Continues to require 3 L of oxygen at present Check oxygen requirement prior to discharge.  4. Dyslipidemia.  continue Lipitor.  5. NSVT. ICD implantation. Maintain potassium more than 4 magnesium more than 2. Continue Toprol-XL  Pain management: when necessary Tylenol Activity: consulted physical therapy Bowel regimen: last BM 11/11/2015, MiraLAX added Diet: cardiac DVT Prophylaxis: subcutaneous  Heparin  Advance goals of care discussion: full code  Family Communication: no family was present at bedside, at the time of interview.   Disposition:  Discharge to home. Expected discharge date: 11/15/2015, improvement in oxygenation  Consultants: cardiology Procedures: right and left cardiac catheterization  Antibiotics: Anti-infectives    None        Intake/Output Summary (Last 24 hours) at 11/14/15 1411 Last data filed at 11/14/15 1103  Gross per 24 hour  Intake    240 ml  Output    200 ml  Net     40 ml   Filed Weights   11/12/15 1609 11/13/15 0516 11/14/15 0616  Weight: 86.909 kg (191 lb 9.6 oz) 86.637 kg (191 lb) 86.365 kg (190 lb 6.4 oz)    Objective: Physical Exam: Filed Vitals:   11/13/15 2215 11/14/15 0031 11/14/15 0616 11/14/15 1102  BP: 100/50 96/59 115/56 115/55  Pulse:  72 67 72  Temp:  97.5 F (36.4 C) 97.5 F (36.4 C) 97.4 F (36.3 C)  TempSrc:  Oral Oral Oral  Resp:  18 18 18   Height:      Weight:   86.365 kg (190 lb 6.4 oz)   SpO2:  97% 95% 98%   General: Alert, Awake and Oriented to Time, Place and Person. Appear in mild distress Eyes: PERRL, Conjunctiva normal ENT: Oral Mucosa clear moist. Neck: positive JVD, no Abnormal Mass Or lumps Cardiovascular: S1 and S2 Present, aortic systolic Murmur, Respiratory: Bilateral Air entry equal and Decreased, basal Crackles, no wheezes Abdomen: Bowel Sound present, Soft and no tenderness Skin: no redness, no Rash  Extremities: bilateral Pedal edema, no calf tenderness Neurologic: Grossly no focal neuro deficit. Bilaterally Equal motor strength   Data Reviewed: CBC:  Recent Labs Lab 11/12/15 0411 11/13/15 0400 11/13/15 1828 11/14/15 0446  WBC 6.0 6.0 6.0 6.0  NEUTROABS 4.7  --   --  4.0  HGB 13.3 12.8* 13.5 13.0  HCT 41.5 39.8 40.8 39.8  MCV 101.0* 100.0 99.5 100.5*  PLT 151 144* 135* 133*   Basic Metabolic Panel:  Recent Labs Lab 11/12/15 0411 11/13/15 0400 11/13/15 1828  11/14/15 0446  NA 142 142  --  141  K 4.2 3.3*  --  3.8  CL 108 108  --  110  CO2 24 25  --  25  GLUCOSE 224* 154*  --  171*  BUN 38* 36*  --  34*  CREATININE 2.08* 1.93* 1.78* 1.76*  CALCIUM 9.0 9.1  --  8.9  MG 2.1 1.9  --  2.1  PHOS 3.1 3.4  --   --     Liver Function Tests:  Recent Labs Lab 11/12/15 0411 11/14/15 0446  AST 46* 28  ALT 53 38  ALKPHOS 111 96  BILITOT 1.1 1.3*  PROT 5.5* 5.4*  ALBUMIN 3.2* 3.3*   No results for input(s): LIPASE, AMYLASE in the last 168 hours. No results for input(s): AMMONIA in the last 168 hours. Coagulation Profile:  Recent Labs Lab 11/12/15 0411  INR 1.18   Cardiac Enzymes:  Recent Labs Lab 11/12/15 0411 11/12/15 0811 11/12/15 1437  TROPONINI 0.34* 0.34* 0.22*   BNP (last 3 results) No results for input(s): PROBNP in the last 8760 hours.  CBG: No results for input(s): GLUCAP in the last 168 hours.  Studies: No results found.   Scheduled Meds: . allopurinol  100 mg Oral Daily  . antiseptic oral rinse  7 mL Mouth Rinse BID  . aspirin EC  81 mg Oral Daily  . atorvastatin  20 mg Oral Daily  . clopidogrel  75 mg Oral Daily  . gabapentin  300 mg Oral QHS  . heparin  5,000 Units Subcutaneous Q8H  . isosorbide mononitrate  30 mg Oral Daily  . metoprolol succinate  200 mg Oral Daily  . pantoprazole  40 mg Oral Daily  . sacubitril-valsartan  1 tablet Oral Daily  . sodium chloride flush  3 mL Intravenous Q12H  . sodium chloride flush  3 mL Intravenous Q12H   Continuous Infusions:   PRN Meds: sodium chloride, sodium chloride, sodium chloride, acetaminophen, albuterol, ipratropium-albuterol, ondansetron (ZOFRAN) IV, sodium chloride flush, sodium chloride flush, zolpidem  Time spent: 30 minutes  Author: Lynden Oxford, MD [facility] Pager: [phone] 11/14/2015 2:11 PM  If 7PM-7AM, please contact night-coverage at [email or web address], password [facility]

## 2015-11-14 NOTE — Care Management Important Message (Signed)
Important Message  Patient Details  Name: Scott Parsons MRN: [id number] Date of Birth: 1929/08/27   Medicare Important Message Given:  Yes    Bernadette Hoit 11/14/2015, 10:30 AM

## 2015-11-16 NOTE — Discharge Summary (Signed)
[facility] Discharge Summary   Patient: Scott Parsons JKK:[id number]   PCP: [name], MD DOB: 1930/03/04   Date of admission: 11/12/2015   Date of discharge: 11/14/2015     Discharge Diagnoses:  Principal Problem:   Acute on chronic combined systolic and diastolic congestive heart failure, NYHA class 4 (HCC) Active Problems:   Hyperlipidemia   Carotid artery disease (HCC)   Renal artery stenosis (HCC)   Nonischemic cardiomyopathy ([place])   Acute hypoxemic respiratory failure (HCC)   ICD (implantable cardioverter-defibrillator), dual, in situ   CKD (chronic kidney disease), stage IV (HCC)   PVD (peripheral vascular disease) (HCC)   OSA (obstructive sleep apnea)   Hypertensive heart disease with CHF (congestive heart failure) (HCC)   Non-obstructive CAD   Elevated troponin  Admitted From: Home Disposition:  Home health  Recommendations for Outpatient Follow-up:  1. Follow-up with PCP with BMP. 2. Follow-up with cardiology as recommended.   Follow-up Information    Follow up with Glori Bickers, MD On 11/20/2015.   Specialty:  Cardiology   Why:  at 1000 am for post hospital follow up. Please bring all of your medications to your visit. The code for parking is 3000.   Contact information:   [place] [address] [phone]       Follow up with [facility], MD.   Specialty:  Internal Medicine   Why:  with BMP :::: Left message for office to call pt with follow up information   Contact information:   [place] [id number] [phone]      Diet recommendation: Cardiac and carb modified diet  Activity: The patient is advised to gradually reintroduce usual activities.  Discharge Condition: good  Code Status: Full code  History of present illness: As per the H and P dictated on admission, "82 year old male with PMH as below, which includes CAD, CKD 3, COPD, Systolic CHF (LVEF 36%), OSA, ICD in  place, and HTN. He lives at home with his wife and was in his usual state of health until about one week ago when he was contacted by his Tele-health service to let him know that he had been gaining weight and his lasix dose was doubled for 3 days. He did have documented weight loss with this. He has also had some progressive SOB and cough since that time. Cough productive for minimal yellow sputum. Denies fevers and chills. 7/4 he could really tell something was wrong because he wasn't in the mood to eat. Dyspnea worsened and EMS was called. Upon arrival to ED he was noted to be hypoxic with O2 sats in the uppers 80s. He was placed on NRB with rise to 92%. CXR consistent with pulmonary edema and lasix 80 mg given. BiPAP was started, but he could not tolerate. He was transferred to [facility] for ICU admission. "  Hospital Course:  Summary of his active problems in the hospital is as following. 1. Acute on chronic combined systolic and diastolic congestive heart failure, NYHA class 4 (HCC) Improvement in weight as well as symptoms after IV diuresis. Right and left heart cath shows no significant coronary artery disease. Also shows stable hemodynamics. Appreciate cardiology input. Patient was felt safe to be discharged home per cardiology. Patient will be going home on oral Lasix 40 mg twice a day.  2.peripheral vascular disease. Continue to closely monitor. Patient will follow up with Dr. Gwenlyn Found for carotid artery stenosis  3.  Acute on chronic hypoxic respiratory failure. From acute on chronic CHF No evidence of pneumonia. We will continue to closely monitor. Patient's oxygenation remained stable on room air prior to discharge  4. Dyslipidemia.  continue Lipitor.  5. NSVT. H/O ICD implantation. Maintain potassium more than 4 magnesium more than 2. Continue Toprol-XL  All other chronic medical condition were stable during the hospitalization.  Patient was seen by physical therapy, who  recommended home health, which was arranged by Education officer, museum and case Freight forwarder. On the day of the discharge the patient's vitals were stable, and no other acute medical condition were reported by patient. the patient was felt safe to be discharge at home with home health.  Procedures and Results:   Right and left cardiac catheterization  1. Mild non-obstructive CAD 2. Well compensated hemodynamics after diuresis   Echocardiogram Study Conclusions  - Left ventricle: ? some ventricular non compaction. The cavity  size was severely dilated. Wall thickness was normal. Systolic  function was severely reduced. The estimated ejection fraction  was in the range of 20% to 25%. Diffuse hypokinesis. Doppler  parameters are consistent with both elevated ventricular  end-diastolic filling pressure and elevated left atrial filling  pressure. - Aortic valve: There was mild stenosis. Valve area (VTI): 1.04  cm^2. Valve area (Vmax): 0.85 cm^2. Valve area (Vmean): 0.82  cm^2. - Mitral valve: There was moderate regurgitation. - Left atrium: The atrium was severely dilated. - Atrial septum: No defect or patent foramen ovale was identified. - Impressions: AS severity hard to judge due to poor LV function  but not severe.  Impressions:  - AS severity hard to judge due to poor LV function but not severe  Consultations:  Cardiology  Primary admission with CCM  DISCHARGE MEDICATION: Discharge Medication List as of 11/14/2015  5:12 PM    START taking these medications   Details  pantoprazole (PROTONIX) 40 MG tablet Take 1 tablet (40 mg total) by mouth daily., Starting 11/14/2015, Until Discontinued, Normal    polyethylene glycol (MIRALAX / GLYCOLAX) packet Take 17 g by mouth daily., Starting 11/14/2015, Until Discontinued, Normal    potassium chloride SA (K-DUR,KLOR-CON) 20 MEQ tablet Take 1 tablet (20 mEq total) by mouth daily., Starting 11/15/2015, Until Discontinued, Normal        CONTINUE these medications which have CHANGED   Details  furosemide (LASIX) 40 MG tablet Take 1 tablet (40 mg total) by mouth 2 (two) times daily., Starting 11/14/2015, Until Discontinued, Normal      CONTINUE these medications which have NOT CHANGED   Details  allopurinol (ZYLOPRIM) 100 MG tablet Take 100 mg by mouth daily., Until Discontinued, Historical Med    aspirin EC 81 MG tablet Take 81 mg by mouth daily., Until Discontinued, Historical Med    atorvastatin (LIPITOR) 20 MG tablet Take 20 mg by mouth daily., Until Discontinued, Historical Med    Blood Glucose Monitoring Suppl (FIFTY50 GLUCOSE METER 2.0) w/Device KIT Starting 03/24/2015, Until Discontinued, Historical Med    clopidogrel (PLAVIX) 75 MG tablet Take 75 mg by mouth daily., Until Discontinued, Historical Med    Coenzyme Q10 (CO Q 10) 100 MG CAPS Take 100 mg by mouth daily., Until Discontinued, Historical Med    gabapentin (NEURONTIN) 300 MG capsule Take 300 mg by mouth at bedtime., Until Discontinued, Historical Med    isosorbide mononitrate (IMDUR) 30 MG 24 hr tablet Take 30 mg by mouth daily., Until Discontinued, Historical Med    Magnesium 250 MG TABS Take 2 tablets  by mouth daily., Until Discontinued, Historical Med    metoprolol succinate (TOPROL-XL) 100 MG 24 hr tablet Take 200 mg by mouth daily. Take with or immediately following a meal., Until Discontinued, Historical Med    niacin 500 MG tablet Take 500 mg by mouth daily with breakfast., Until Discontinued, Historical Med    !! NON FORMULARY Wears O2 at night (2 L), Until Discontinued, Historical Med    !! NON FORMULARY Use as directed - Neoteric diabetic skincare, Until Discontinued, Historical Med    sacubitril-valsartan (ENTRESTO) 24-26 MG Take 1 tablet by mouth daily., Until Discontinued, Historical Med    zolpidem (AMBIEN) 10 MG tablet Take 5 mg by mouth at bedtime. , Starting 10/12/2012, Until Discontinued, Historical Med    NITROSTAT 0.4 MG SL  tablet Place 0.4 mg under the tongue every 5 (five) minutes as needed for chest pain (MAX 3 TABLETS). , Starting 07/11/2013, Until Discontinued, Historical Med     !! - Potential duplicate medications found. Please discuss with provider.     Allergies  Allergen Reactions  . Amoxicillin Itching    Itching    . Biaxin [Clarithromycin] Itching    Lip swelling     . Erythromycin Itching  . Penicillins Itching  . Shellfish Allergy Itching        . Sulfur    Discharge Instructions    Avoid straining    Complete by:  As directed      Diet - low sodium heart healthy    Complete by:  As directed      Discharge instructions    Complete by:  As directed   It is important that you read following instructions as well as go over your medication list with RN to help you understand your care after this hospitalization.  Discharge Instructions: Please follow-up with PCP in one week  Please request your primary care physician to go over all Hospital Tests and Procedure/Radiological results at the follow up,  Please get all Hospital records sent to your PCP by signing hospital release before you go home.   Do not take more than prescribed Pain, Sleep and Anxiety Medications. You were cared for by a hospitalist during your hospital stay. If you have any questions about your discharge medications or the care you received while you were in the hospital after you are discharged, you can call the unit and ask to speak with the hospitalist on call if the hospitalist that took care of you is not available.  Once you are discharged, your primary care physician will handle any further medical issues. Please note that NO REFILLS for any discharge medications will be authorized once you are discharged, as it is imperative that you return to your primary care physician (or establish a relationship with a primary care physician if you do not have one) for your aftercare needs so that they can reassess your need  for medications and monitor your lab values. You Must read complete instructions/literature along with all the possible adverse reactions/side effects for all the Medicines you take and that have been prescribed to you. Take any new Medicines after you have completely understood and accept all the possible adverse reactions/side effects. Wear Seat belts while driving. If you have smoked or chewed Tobacco in the last 2 yrs please stop smoking and/or stop any Recreational drug use.     Heart Failure patients record your daily weight using the same scale at the same time of day    Complete  by:  As directed      Increase activity slowly    Complete by:  As directed           Discharge Exam: Filed Weights   11/12/15 1609 11/13/15 0516 11/14/15 0616  Weight: 86.909 kg (191 lb 9.6 oz) 86.637 kg (191 lb) 86.365 kg (190 lb 6.4 oz)   Filed Vitals:   11/14/15 0616 11/14/15 1102  BP: 115/56 115/55  Pulse: 67 72  Temp: 97.5 F (36.4 C) 97.4 F (36.3 C)  Resp: 18 18   General: Appear in mild distress, no Rash; Oral Mucosa moist. Cardiovascular: S1 and S2 Present, aortic systolic Murmur, no JVD Respiratory: Bilateral Air entry present and Clear to Auscultation, no Crackles, no wheezes Abdomen: Bowel Sound present, Soft and no tenderness Extremities: no Pedal edema, no calf tenderness Neurology: Grossly no focal neuro deficit.  The results of significant diagnostics from this hospitalization (including imaging, microbiology, ancillary and laboratory) are listed below for reference.    Significant Diagnostic Studies: Dg Chest Port 1 View  11/12/2015  CLINICAL DATA:  Follow-up pulmonary edema.  Subsequent encounter. EXAM: PORTABLE CHEST 1 VIEW COMPARISON:  Chest radiograph performed 11/11/2015 FINDINGS: Bilateral central airspace opacification, right greater than left, is improved from the prior study. This remains compatible with pulmonary edema. Small bilateral pleural effusions are suspected. No  pneumothorax is seen. The cardiomediastinal silhouette is normal in size. A pacemaker/AICD is noted overlying the left chest wall, with leads ending overlying the right atrium, right ventricle and coronary sinus. No acute osseous abnormalities are seen. IMPRESSION: Interval improvement in bilateral central airspace opacification, right greater than left, reflecting improving pulmonary edema. Small bilateral pleural effusions suspected. Electronically Signed   By: Garald Balding M.D.   On: 11/12/2015 03:21    Microbiology: Recent Results (from the past 240 hour(s))  MRSA PCR Screening     Status: None   Collection Time: 11/12/15  2:22 AM  Result Value Ref Range Status   MRSA by PCR NEGATIVE NEGATIVE Final    Comment:        The GeneXpert MRSA Assay (FDA approved for NASAL specimens only), is one component of a comprehensive MRSA colonization surveillance program. It is not intended to diagnose MRSA infection nor to guide or monitor treatment for MRSA infections.      Labs: CBC:  Recent Labs Lab 11/12/15 0411 11/13/15 0400 11/13/15 1828 11/14/15 0446  WBC 6.0 6.0 6.0 6.0  NEUTROABS 4.7  --   --  4.0  HGB 13.3 12.8* 13.5 13.0  HCT 41.5 39.8 40.8 39.8  MCV 101.0* 100.0 99.5 100.5*  PLT 151 144* 135* 431*   Basic Metabolic Panel:  Recent Labs Lab 11/12/15 0411 11/13/15 0400 11/13/15 1828 11/14/15 0446  NA 142 142  --  141  K 4.2 3.3*  --  3.8  CL 108 108  --  110  CO2 24 25  --  25  GLUCOSE 224* 154*  --  171*  BUN 38* 36*  --  34*  CREATININE 2.08* 1.93* 1.78* 1.76*  CALCIUM 9.0 9.1  --  8.9  MG 2.1 1.9  --  2.1  PHOS 3.1 3.4  --   --    Liver Function Tests:  Recent Labs Lab 11/12/15 0411 11/14/15 0446  AST 46* 28  ALT 53 38  ALKPHOS 111 96  BILITOT 1.1 1.3*  PROT 5.5* 5.4*  ALBUMIN 3.2* 3.3*   No results for input(s): LIPASE, AMYLASE in the last 168 hours.  No results for input(s): AMMONIA in the last 168 hours. Cardiac Enzymes:  Recent Labs Lab  11/12/15 0411 11/12/15 0811 11/12/15 1437  TROPONINI 0.34* 0.34* 0.22*   BNP (last 3 results)  Recent Labs  04/15/15 1352 07/11/15 1205 11/12/15 0411  BNP 173.3* 240.2* 4293.7*   CBG: No results for input(s): GLUCAP in the last 168 hours. Time spent: 30 minutes  Signed:  PATEL, PRANAV  [facility] 11/14/2015 , 11:21 PM

## 2015-11-17 ENCOUNTER — Telehealth: Payer: Self-pay

## 2015-11-17 ENCOUNTER — Ambulatory Visit (INDEPENDENT_AMBULATORY_CARE_PROVIDER_SITE_OTHER): Payer: Medicare Other

## 2015-11-17 ENCOUNTER — Ambulatory Visit: Payer: Medicare Other

## 2015-11-17 DIAGNOSIS — I5022 Chronic systolic (congestive) heart failure: Secondary | ICD-10-CM

## 2015-11-17 DIAGNOSIS — Z95810 Presence of automatic (implantable) cardiac defibrillator: Secondary | ICD-10-CM

## 2015-11-17 NOTE — Progress Notes (Signed)
EPIC Encounter for ICM Monitoring  Patient Name: Scott Parsons is a 82 y.o. male Date: 11/17/2015 Primary Care Physican: [name], MD Primary Cardiologist: Berry/Bensimhon Electrophysiologist: Allred Dry Weight: 191.4 lb  Bi-V Pacing:  76.7% (was 97.5% on 08/25/2015)    Heart Failure questions reviewed.  Patient hospitalized 11/12/2015 to 11/14/2015 for CHF exacerbation.  He is feeling better since discharge.  He has not been compliant with low sodium diet and is trying follow one now.  Wife is writing down all sodium content for foods.  Advised to limit to 2000 mg daily.  He stated he now understands the importance of low salt diet.  Discussed in depth regarding diet.  He reported he and his wife eat out several times a week.  One of the places they frequent is [facility] and normally eats chicken breast.  Advised [employer] chicken breast is 1190 mg salt per breast and his limit is 2000 mg daily.  Reviewed other salt amounts in other foods such as cheese, peanuts, and ice cream.    Thoracic impedence continues be abnormal showing fluid accumulation since hospital discharge but does show improvement.    Recommendations: None, patient has CHF clinic appointment on 11/19/2015.  He has appointment with PCP, Dr [name] today.  Repeat transmission 12/01/2015.   ICM trend: 11/17/2015       Follow-up plan: ICM clinic phone appointment on 12/01/2015.  Copy of ICM check sent to primary cardiologist and device physician.   Karie Soda, RN 11/17/2015 11:22 AM

## 2015-11-17 NOTE — Telephone Encounter (Signed)
Spoke with patient and requested remote transmission.

## 2015-11-19 NOTE — Progress Notes (Addendum)
Patient ID: Scott Parsons, male   DOB: 03/18/1930, 82 y.o.   MRN: [id number]    Advanced Heart Failure Clinic Note    Primary Care: [name], MD Primary Cardiologist: Dr Gwenlyn Found Primary HF: Dr. [name]   HPI:  Scott [facility]ons is a 82 y.o. male with history of NICM, Chronic systolic CHF (Echo 07/2990 EF 20-25%), LBBB s/p biV ICD, CKD III-IV, HTN, PAD, Carotid stenosis, and RAS s/p renal stenting.   He is followed closely in the [facility] CHF clinic by Carolynn Serve. In 09/2015 was found to have worsening of renal fxn along with hyperkalemia. He was admitted to [facility] and his lisinopril, valsartan, and spironolactone were discontinued. Labs were stable upon f/u and he was subsequently placed on entresto.  Was taken to [place] ED on 11/11/15 with acute worsening dyspnea. Required Bipap. Tx to [facility] for further eval. Was admitted by CCM for resp failure. Had significant symptom improvement with diuresis. Catheterization as below with mild non-obstructive CAD and well compensated hemodynamics after diuresis. Diuresed 2.2 L. Weight 190 on discharge.   He presents today for post hospital follow up.  During admission pt granddaughter stated he eats a lot of Cheetos and [place] toast. Has been feeling good since leaving the hospital.  Feels like he is not quite back to normal as far as strength, but improving.  This is the first day he's been out really. Breathing has been fine, denies any problems.  Had slight SOB with walking in the heat.  No SOB walking around house or ADLs. Can walk about 250-300 ft at a time. Has been watching his salt.  Did have a vegetable plate from [employer] cafeteria yesterday with a glass of water. Has cut back fluid a lot since PTA.  He does get lightheaded with rapid standing, but quickly passes. States blood pressure was 90/50 in PCP office yesterday.   Optivol interrogation: [facility] has been well above threshold since the middle of June. Thoracic impedence down. 1 AT/AF alert towards  end of June, likely SVT. Pt activity decreased to ~ 1 hr daily.   R/LHC 11/13/15  Mild non-obstructive CAD, and well compensated hemodynamics after diuresis.  RA = 3 RV = 36/3 PA = 34/12 (20) PCW = 12 Ao = 118/48 (72) LV = 129/18 Fick cardiac output/index = 5.6/2.6 PVR = 1.4 WU SVR = 984 FA sat = 87% PA sat = 58%, 55%  Echo 11/13/15  LVEF 20-25% with diffuse hypokinesis. Mild AS, Moderate MR, Severe LAE   Past Medical History  Diagnosis Date  . Non-obstructive CAD     a. 11/2001 Cath [facility]): LM nl, LAD 67m LCX nondom, nl, RI large, nl, RCA dominant, 49m.  . Marland Kitchenypertensive heart disease with CHF (congestive heart failure) ([facility] [place]  . Hyperlipidemia   . OSA (obstructive sleep apnea)     a. Does not tolerate/use CPAP.  . Marland KitchenVD (peripheral vascular disease) ([facility]    a. 10/2005 PTA: right external iliac artery angioplasty and left renal artery angioplasty and stenting;  b. 10/2015 ABI's in setting of recurrent claudication: R 0.97, L 0.77 (reduced by 0.12 from prior study).  . Renal artery stenosis ([facility]    a. 10/2005 s/p L renal artery stenting.  . Marland Kitchenistory of stroke   . Carotid artery disease ([facility]    a. 04/2015 mild to mod bilat ICA stenosis;  b. 6/4/26837>41%ICA stenosis, no significant dzs on right.  . Nonischemic cardiomyopathy ([facility]  a. 11/2001 nonobs cath; b. 09/2013 s/p MDT Hillery Aldo XT CRT-D (ser #[id number]); c. 04/2014 Echo: EF 20-25%;  d. Followed @ [facility] by K. Andree Elk, MD.  . Chronic combined systolic and diastolic CHF, NYHA class 4 ([place])     a. 01/2013 Echo: EF 25-30%; b. 08/2013 Echo: EF 15-20%; c. 04/2014 Echo: EF 20-25%, mild LVH, Gr1 DD, mild MR, mod dil LA.  . LBBB (left bundle branch block)   . TIA (transient ischemic attack) 06/2010  . Pre-diabetes   . ICD (implantable cardioverter-defibrillator), dual, in situ     a. 09/2013 s/p MDT Hillery Aldo XT CRT-D (ser #[id number]).  . CKD (chronic kidney disease), stage IV ([place])     a. baseline creat of ~ 1.8;  b. 09/2015  Admitted to [facility] with AKI and hyperkalemia.    Current Outpatient Prescriptions  Medication Sig Dispense Refill  . allopurinol (ZYLOPRIM) 100 MG tablet Take 100 mg by mouth daily.    [name] aspirin EC 81 MG tablet Take 81 mg by mouth daily.    [name] atorvastatin (LIPITOR) 20 MG tablet Take 20 mg by mouth daily.    . Blood Glucose Monitoring Suppl (FIFTY50 GLUCOSE METER 2.0) w/Device KIT     . clopidogrel (PLAVIX) 75 MG tablet Take 75 mg by mouth daily.    . Coenzyme Q10 (CO Q 10) 100 MG CAPS Take 100 mg by mouth daily.    . furosemide (LASIX) 40 MG tablet Take 1 tablet (40 mg total) by mouth 2 (two) times daily. 60 tablet 0  . gabapentin (NEURONTIN) 300 MG capsule Take 300 mg by mouth at bedtime.    . isosorbide mononitrate (IMDUR) 30 MG 24 hr tablet Take 30 mg by mouth daily.    . Magnesium 250 MG TABS Take 2 tablets by mouth daily.    . metoprolol succinate (TOPROL-XL) 100 MG 24 hr tablet Take 200 mg by mouth daily. Take with or immediately following a meal.    . niacin 500 MG tablet Take 500 mg by mouth daily with breakfast.    . NON FORMULARY Wears O2 at night (2 L)    . NON FORMULARY Use as directed - Neoteric diabetic skincare    . pantoprazole (PROTONIX) 40 MG tablet Take 1 tablet (40 mg total) by mouth daily. 10 tablet 0  . potassium chloride SA (K-DUR,KLOR-CON) 20 MEQ tablet Take 1 tablet (20 mEq total) by mouth daily. 15 tablet 0  . sacubitril-valsartan (ENTRESTO) 24-26 MG Take 1 tablet by mouth daily.    [name] zolpidem (AMBIEN) 10 MG tablet Take 5 mg by mouth at bedtime.     [name] NITROSTAT 0.4 MG SL tablet Place 0.4 mg under the tongue every 5 (five) minutes as needed for chest pain (MAX 3 TABLETS). Reported on 11/20/2015     No current facility-administered medications for this encounter.    Allergies  Allergen Reactions  . Amoxicillin Itching    Itching    . Biaxin [Clarithromycin] Itching    Lip swelling     . Erythromycin Itching  . Penicillins Itching  . Shellfish Allergy Itching         . Sulfa Antibiotics Other (See Comments)    AKI and hyperkalemia      Social History   Social History  . Marital Status: Married    Spouse Name: N/A  . Number of Children: N/A  . Years of Education: N/A   Occupational History  . Not on file.   Social History Main  Topics  . Smoking status: Former Smoker -- 11 years    Types: Cigarettes    Quit date: 08/11/2004  . Smokeless tobacco: Never Used  . Alcohol Use: No  . Drug Use: No  . Sexual Activity: Not on file   Other Topics Concern  . Not on file   Social History Narrative   Retired.  Lives in [place] with his wife.  Tries to remain active.      Family History  Problem Relation Age of Onset  . Alzheimer's disease Mother   . Heart disease Father   . Heart disease Paternal Grandfather   . Heart attack Paternal Grandfather     Filed Vitals:   11/20/15 1022  BP: 110/58  Pulse: 90  Resp: 18  Weight: 194 lb (87.998 kg)  SpO2: 98%    PHYSICAL EXAM: General: Elderly. No resp difficulty HEENT: normal Neck: supple. JVP 6-7. Carotids 2+ bilat; no bruits. No thyromegaly or nodule noted.  Cor: PMI nondisplaced. Regular rate & rhythm with frequent ectopy. 2/6 MR, TR Lungs: CTAB, normal effort Abdomen: soft, NT, ND, no HSM. No bruits or masses. +BS  Extremities: no cyanosis, clubbing, rash, edema Neuro: alert & orientedx3, cranial nerves grossly intact. moves all 4 extremities w/o difficulty. Affect pleasant  EKG: V paced 70 bpm with occasional AV dual paced complex with PVCs.  ASSESSMENT & PLAN:  1. Chronic systolic HF,  Echo 0/0/17 EF 20-25% - NICM --s/p CRT-D - Volume status stable on exam. ReDS Vest reading 25. Recent RHC showed RA of 3 (weight stable from that day) Though optivol shows decreased thoracic impedence and fluid index markedly elevated. - Discussed case with Dr. Aundra Dubin. Volume status appears stable despite optivol reading, will leave diuretics at current doses. All other information  consistent with a stable volume status.  - Continue lasix 40 mg BID with potassium 20 meq daily.  Will check BMET today.  - Continue toprol-xl 200 mg daily - Continue Entresto 24/26 mg BID. Will not up-titrate with soft pressure (down into 90s yesterday) - Continue Imdur 30 mg daily.  - Pt encouraged to weight daily and to let us know about any weight gain of 3 lbs overnight or 5 lbs in one week.  2. CKD III-IV - Will obtain BMET from PCP drawn yesterday.  3. NSVT - Still having frequent PVCs on EKG 4. Carotid stenosis > 70% on L  5. CAD - Mild non obstructive on cath 11/2015 - Continue ASA 81 mg daily, Clopidigrel 75 mg daily,   Stable on current meds. Will not up-titrate with soft pressures here and at PCP. Follow up 6 weeks  Has appt with Dr Gwenlyn Found in 3 weeks to help address left carotid stenosis.   Legrand Como [address]" [place], PA-C 11/20/2015 10:23 AM   Total time spent > 25 minutes. Over half that spent discussing the above.

## 2015-11-20 ENCOUNTER — Encounter (HOSPITAL_COMMUNITY): Payer: Self-pay

## 2015-11-20 ENCOUNTER — Ambulatory Visit (HOSPITAL_COMMUNITY)
Admit: 2015-11-20 | Discharge: 2015-11-20 | Disposition: A | Discharge status: Home / Self Care | Payer: Medicare Other | Source: Ambulatory Visit | Attending: Cardiology | Admitting: Cardiology

## 2015-11-20 VITALS — BP 110/58 | HR 90 | Resp 18 | Wt 194.0 lb

## 2015-11-20 DIAGNOSIS — R7303 Prediabetes: Secondary | ICD-10-CM | POA: Diagnosis not present

## 2015-11-20 DIAGNOSIS — Z95820 Peripheral vascular angioplasty status with implants and grafts: Secondary | ICD-10-CM | POA: Diagnosis not present

## 2015-11-20 DIAGNOSIS — Z7902 Long term (current) use of antithrombotics/antiplatelets: Secondary | ICD-10-CM | POA: Diagnosis not present

## 2015-11-20 DIAGNOSIS — I130 Hypertensive heart and chronic kidney disease with heart failure and stage 1 through stage 4 chronic kidney disease, or unspecified chronic kidney disease: Secondary | ICD-10-CM | POA: Diagnosis not present

## 2015-11-20 DIAGNOSIS — I779 Disorder of arteries and arterioles, unspecified: Secondary | ICD-10-CM

## 2015-11-20 DIAGNOSIS — I428 Other cardiomyopathies: Secondary | ICD-10-CM | POA: Diagnosis not present

## 2015-11-20 DIAGNOSIS — I6522 Occlusion and stenosis of left carotid artery: Secondary | ICD-10-CM | POA: Insufficient documentation

## 2015-11-20 DIAGNOSIS — I5022 Chronic systolic (congestive) heart failure: Secondary | ICD-10-CM | POA: Insufficient documentation

## 2015-11-20 DIAGNOSIS — Z91013 Allergy to seafood: Secondary | ICD-10-CM | POA: Insufficient documentation

## 2015-11-20 DIAGNOSIS — Z95810 Presence of automatic (implantable) cardiac defibrillator: Secondary | ICD-10-CM | POA: Diagnosis not present

## 2015-11-20 DIAGNOSIS — E785 Hyperlipidemia, unspecified: Secondary | ICD-10-CM | POA: Diagnosis not present

## 2015-11-20 DIAGNOSIS — Z87891 Personal history of nicotine dependence: Secondary | ICD-10-CM | POA: Insufficient documentation

## 2015-11-20 DIAGNOSIS — Z7982 Long term (current) use of aspirin: Secondary | ICD-10-CM | POA: Diagnosis not present

## 2015-11-20 DIAGNOSIS — Z79899 Other long term (current) drug therapy: Secondary | ICD-10-CM | POA: Diagnosis not present

## 2015-11-20 DIAGNOSIS — I471 Supraventricular tachycardia: Secondary | ICD-10-CM | POA: Insufficient documentation

## 2015-11-20 DIAGNOSIS — I739 Peripheral vascular disease, unspecified: Secondary | ICD-10-CM | POA: Insufficient documentation

## 2015-11-20 DIAGNOSIS — I429 Cardiomyopathy, unspecified: Secondary | ICD-10-CM | POA: Diagnosis not present

## 2015-11-20 DIAGNOSIS — Z8249 Family history of ischemic heart disease and other diseases of the circulatory system: Secondary | ICD-10-CM | POA: Diagnosis not present

## 2015-11-20 DIAGNOSIS — Z880 Allergy status to penicillin: Secondary | ICD-10-CM | POA: Diagnosis not present

## 2015-11-20 DIAGNOSIS — I2583 Coronary atherosclerosis due to lipid rich plaque: Secondary | ICD-10-CM

## 2015-11-20 DIAGNOSIS — I2510 Atherosclerotic heart disease of native coronary artery without angina pectoris: Secondary | ICD-10-CM | POA: Diagnosis not present

## 2015-11-20 DIAGNOSIS — Z882 Allergy status to sulfonamides status: Secondary | ICD-10-CM | POA: Insufficient documentation

## 2015-11-20 DIAGNOSIS — Z881 Allergy status to other antibiotic agents status: Secondary | ICD-10-CM | POA: Diagnosis not present

## 2015-11-20 DIAGNOSIS — N183 Chronic kidney disease, stage 3 unspecified: Secondary | ICD-10-CM

## 2015-11-20 DIAGNOSIS — Z8673 Personal history of transient ischemic attack (TIA), and cerebral infarction without residual deficits: Secondary | ICD-10-CM | POA: Insufficient documentation

## 2015-11-20 DIAGNOSIS — G4733 Obstructive sleep apnea (adult) (pediatric): Secondary | ICD-10-CM | POA: Diagnosis not present

## 2015-11-20 DIAGNOSIS — I10 Essential (primary) hypertension: Secondary | ICD-10-CM

## 2015-11-20 MED ORDER — POTASSIUM CHLORIDE CRYS ER 20 MEQ PO TBCR: 20.0000 meq | EXTENDED_RELEASE_TABLET | Freq: Every day | ORAL | Status: DC

## 2015-11-20 MED ORDER — METOPROLOL SUCCINATE ER 100 MG PO TB24: 200.0000 mg | ORAL_TABLET | Freq: Every day | ORAL | Status: DC

## 2015-11-20 MED ORDER — FUROSEMIDE 40 MG PO TABS: 40.0000 mg | ORAL_TABLET | Freq: Two times a day (BID) | ORAL | Status: DC

## 2015-11-20 NOTE — Patient Instructions (Signed)
Refilled Metoprolol, lasix, and potassium to pharmacy.  Follow up 6 weeks with Otilio Saber PA.  Do the following things EVERYDAY: 1) Weigh yourself in the morning before breakfast. Write it down and keep it in a log. 2) Take your medicines as prescribed 3) Eat low salt foods-Limit salt (sodium) to 2000 mg per day.  4) Stay as active as you can everyday 5) Limit all fluids for the day to less than 2 liters

## 2015-11-20 NOTE — Progress Notes (Addendum)
Advanced Heart Failure Medication Review by a Pharmacist  Does the patient  feel that his/her medications are working for him/her?  yes  Has the patient been experiencing any side effects to the medications prescribed?  no  Does the patient measure his/her own blood pressure or blood glucose at home?  yes   Does the patient have any problems obtaining medications due to transportation or finances?   Yes - Entresto expensive so will enroll in PAN foundation  Understanding of regimen: good Understanding of indications: good Potential of compliance: good Patient understands to avoid NSAIDs. Patient understands to avoid decongestants.  Issues to address at subsequent visits: None   Pharmacist comments:  Mr. Markovic is a pleasant 82 yo M presenting with his medication bottles. Patient was recently discharged from hospital and all medications have been reviewed. He reports good compliance with his regimen and did not have any specific medication-related questions or concerns for me at this time. I have enrolled him in PAN foundation to assist with Entresto copays ($800 through 11/18/2016). [facility] pharmacy aware.   Billing ID: [id number] Person Code: [id number] RX Group: [id number] RX BIN: [id number] PCN for Part D: [id number]   [name] Bonnye Fava, PharmD, BCPS, CPP Clinical Pharmacist Pager: [phone] Phone: [phone] 11/20/2015 10:14 AM      Time with patient: 10 minutes Preparation and documentation time: 2 minutes Total time: 12 minutes

## 2015-11-21 ENCOUNTER — Telehealth: Payer: Self-pay | Admitting: Cardiovascular Disease

## 2015-11-26 NOTE — Telephone Encounter (Signed)
Closed encounter °

## 2015-12-01 ENCOUNTER — Telehealth: Payer: Self-pay

## 2015-12-01 ENCOUNTER — Ambulatory Visit (INDEPENDENT_AMBULATORY_CARE_PROVIDER_SITE_OTHER): Payer: Medicare Other

## 2015-12-01 DIAGNOSIS — I5022 Chronic systolic (congestive) heart failure: Secondary | ICD-10-CM | POA: Diagnosis not present

## 2015-12-01 DIAGNOSIS — Z95810 Presence of automatic (implantable) cardiac defibrillator: Secondary | ICD-10-CM

## 2015-12-01 NOTE — Progress Notes (Signed)
EPIC Encounter for ICM Monitoring  Patient Name: [place] is a 82 y.o. male Date: 12/01/2015 Primary Care Physican: HOFFMAN,BYRON, MD Primary Cardiologist: Berry/McLean Electrophysiologist: Allred Dry Weight: 187 lb  Bi-V Pacing:  83.8%       Heart Failure questions reviewed, pt asymptomatic.  Since hospital discharge, Thoracic impedance continues to be decreased suggesting fluid accumulation.  LABS: 11/14/2015 Creatinine 1.76, BUN 34, Potassium 3.8, Sodium 141, eGFR 34 11/13/2015 Creatinine 1.93, BUN 36, Potassium 3.3, Sodium 142, eGFR 30 11/12/2015 Creatinine 2.08, BUN 38, Potassium 4.2, Sodium 142, eGFR 28 08/07/2015 Creatinine 1.86, BUN 36, Potassium 4.4, Sodium 140  Recommendations:  Advised will send to Dr Berry for recommendations.  Patient has appointment with Dr Berry 12/10/2015.  Plan to repeat transmission 12/09/2015.  Advised patient to call if he has any fluid symptoms.     ICM trend:     Follow-up plan: ICM clinic phone appointment on 12/09/2015.  Office visit with Dr Berry on 12/10/2015.   Copy of ICM check sent to primary cardiologist and device physician.   Laurie S Short, RN 12/01/2015 12:11 PM   Addendum:   Recently seen in CHF clinic (note reviewed).  Has close follow-up with Dr Berry. I am worried about his markedly abnormal optivol which corresponds with recent hospitalization and has not improved.  I will schedule an appointment with Amber Seiler NP for CRT optimization.  [name] MD, FACC 12/03/2015 8:03 PM   

## 2015-12-01 NOTE — Telephone Encounter (Signed)
Remote ICM transmission received.  Attempted patient call and left message for return call.   

## 2015-12-05 ENCOUNTER — Other Ambulatory Visit (HOSPITAL_COMMUNITY): Payer: Self-pay | Admitting: Cardiology

## 2015-12-05 NOTE — Progress Notes (Signed)
[name], MD  Karie Soda, RN        I think he should be fine until I see him next week to make up further evaluation.    Call to patient and left detailed message there are no recommendations at this time from Dr Allyson Sabal and he will evaluate him at the office visit on 12/10/2015.  Advised would recheck fluid levels on 12/09/2015.

## 2015-12-06 NOTE — Progress Notes (Signed)
Needs followup asap in CHF clinic, DB patient.

## 2015-12-09 ENCOUNTER — Ambulatory Visit: Payer: Medicare Other | Admitting: Cardiovascular Disease

## 2015-12-09 ENCOUNTER — Telehealth: Payer: Self-pay

## 2015-12-09 ENCOUNTER — Ambulatory Visit (INDEPENDENT_AMBULATORY_CARE_PROVIDER_SITE_OTHER): Payer: Medicare Other

## 2015-12-09 DIAGNOSIS — Z95810 Presence of automatic (implantable) cardiac defibrillator: Secondary | ICD-10-CM

## 2015-12-09 DIAGNOSIS — I5022 Chronic systolic (congestive) heart failure: Secondary | ICD-10-CM

## 2015-12-09 NOTE — Progress Notes (Signed)
EPIC Encounter for ICM Monitoring  Patient Name: [place] Parsons is a 82 y.o. male Date: 12/09/2015 Primary Care Physican: [name], MD Primary Cardiologist: Berry/McLean Electrophysiologist: Allred Dry Weight: 186 lb  Bi-V Pacing:  86.5%       Heart Failure questions reviewed, pt asymptomatic.  Since last ICM transmission on 12/01/2015, thoracic impedance has improved but is still decreased suggesting fluid accumulation.  Recommendations: No changes.  He reported he has been working on low sodium diet but has been very hard.  He does not like a lot of the foods that are low or no sodium. Advised to use herbs to help flavor his foods.   ICM trend: 12/09/2015     Follow-up plan: ICM clinic phone appointment on 01/20/2016.  Office appointment with Dr Allyson Sabal on 12/10/2015. Office appointment with Gypsy Balsam, NP on 12/19/2015.  Copy of ICM check sent to primary cardiologist and device physician to review improvement of thoracic impedance.   Karie Soda, RN 12/09/2015 9:46 AM

## 2015-12-09 NOTE — Progress Notes (Signed)
Should get CHF clinic followup, sees Dr Gala Romney.

## 2015-12-09 NOTE — Telephone Encounter (Signed)
Remote ICM transmission received.  Attempted patient call and wife stated he was not home. 

## 2015-12-10 ENCOUNTER — Ambulatory Visit (INDEPENDENT_AMBULATORY_CARE_PROVIDER_SITE_OTHER): Payer: Medicare Other | Admitting: Cardiovascular Disease

## 2015-12-10 ENCOUNTER — Encounter: Payer: Self-pay | Admitting: Cardiovascular Disease

## 2015-12-10 DIAGNOSIS — I10 Essential (primary) hypertension: Secondary | ICD-10-CM | POA: Diagnosis not present

## 2015-12-10 DIAGNOSIS — I5042 Chronic combined systolic (congestive) and diastolic (congestive) heart failure: Secondary | ICD-10-CM | POA: Diagnosis not present

## 2015-12-10 DIAGNOSIS — I739 Peripheral vascular disease, unspecified: Secondary | ICD-10-CM

## 2015-12-10 DIAGNOSIS — I779 Disorder of arteries and arterioles, unspecified: Secondary | ICD-10-CM

## 2015-12-10 NOTE — Assessment & Plan Note (Signed)
[facility]ons has combined systolic and diastolic heart failure. He is receiving minimal ecchymosis to hospital 7/5 through 7/7 with decompensated CHF. He was diuresed. He underwent right and left heart cath by Dr. Gala Romney revealing noncritical CAD with normal filling pressures. His EF was 20-25% by 2-D echo. He was also found to have aortic stenosis felt not to be severe. His breathing is slowly improving. He was transitioned to [place].

## 2015-12-10 NOTE — Assessment & Plan Note (Signed)
History of carotid artery disease with recent Doppler performed 05/08/15 revealing moderate bilateral ICA stenosis. This will be followed on an annual basis.

## 2015-12-10 NOTE — Assessment & Plan Note (Signed)
History of hypertensive blood pressure measures 88/50. He is on Toprol and Entresto.

## 2015-12-10 NOTE — Patient Instructions (Signed)
Medication Instructions:  Your physician recommends that you continue on your current medications as directed. Please refer to the Current Medication list given to you today.   Follow-Up: We request that you follow-up in: 6 WEEKS with an extender and in 6 MONTHS with Dr San Morelle will receive a reminder letter in the mail two months in advance. If you don't receive a letter, please call our office to schedule the follow-up appointment.  If you need a refill on your cardiac medications before your next appointment, please call your pharmacy.

## 2015-12-10 NOTE — Progress Notes (Signed)
12/10/2015 [place] [facility]ons   1930/01/29  [id number]  Primary Physician Scott Bott, MD Primary Cardiologist: Scott Harp MD [place]  HPI:  The patient is a very pleasant 82 year old, mildly overweight, married Caucasian male, father of 38, grandfather to 7 grandchildren who I saw in the office 10/22/15. He has a history of noncritical CAD by cath which I performed in 2003 with mild LV dysfunction. At that time, he had apical and inferoapical wall motion abnormalities and EF of 45%. He has PVOD with bilateral carotid disease left greater than right which we follow by duplex ultrasound. He is neurologically asymptomatic. I stented his left renal artery October 18, 2005, as well as both iliac arteries. He does have moderate SFA disease bilaterally. He does complain of some hip pain when walking on a treadmill or walking up an incline, but rides a bike without limitation. His other problems include hypertension and hyperlipidemia. His last Myoview performed December 08, 2009, revealed inferior scar. Dr. Heber Parsons follows his lipid profile. His most recent Dopplers reveal stable internal carotid artery stenosis left greater than right, as well as patent iliac stents with probably mild to moderate in-stent restenosis with ABIs of 0.89 on the right and 0.79 on the left.  Saw him last a/2/13 he has had admissions for congestive heart failure at [facility] in March of this year probably related to dietary indiscretion. He spent today's in the hospital I was direst. He had recent renal Doppler studies that showed progression of disease on left side suggesting "in-stent restenosis. He denies chest pain or shortness of breath now. Does have lifestyle limiting claudication. His last lower extremity arterial Doppler studies were performed 01/25/13. He also has moderate asymmetric left internal carotid artery stenosis by 2+ ultrasound performed 11/08/11. Because his echocardiogram performed 09/06/13  revealed an ejection fraction of 15 and 20% he underwent biventricular ICD implantation by [place] 09/13/13 with a CRT device.he clinically improved after that although his ejection fraction by 2-D echo in December was 20-25%.e apparently also a solid [place] [facility] congestive heart failure clinic and was recently transitioned to [place]. He was seen at [facility] for a TIA type episode on 10/09/2015 with transient left-sided weakness and numbness. Carotid Dopplers performed there did apparently show some progression of his left internal carotid artery stenosis over this did not appear to be in the critical range. He was admitted to [facility] 11/12/15 for 2 days with respiratory failure requiring BiPAP. He is found to be in pulmonary edema. He was diuresed. 2-D echo revealed an EF in the 20-25% range. He does have a Bi VI CD in place.  This was implanted by Dr. Rayann Parsons 09/13/13. Right left heart cath performed by Dr. Haroldine Parsons  showed minimal CAD with appropriate filling pressure suggesting adequate diuresis.    Current Outpatient Prescriptions  Medication Sig Dispense Refill  . allopurinol (ZYLOPRIM) 100 MG tablet Take 100 mg by mouth daily.    [name] aspirin EC 81 MG tablet Take 81 mg by mouth daily.    [name] atorvastatin (LIPITOR) 20 MG tablet Take 20 mg by mouth daily.    . Blood Glucose Monitoring Suppl (FIFTY50 GLUCOSE METER 2.0) w/Device KIT     . clopidogrel (PLAVIX) 75 MG tablet Take 75 mg by mouth daily.    . Coenzyme Q10 (CO Q 10) 100 MG CAPS Take 100 mg by mouth daily.    . furosemide (LASIX) 40 MG tablet Take 1 tablet (40 mg  total) by mouth 2 (two) times daily. 60 tablet 6  . gabapentin (NEURONTIN) 300 MG capsule Take 300 mg by mouth at bedtime.    . isosorbide mononitrate (IMDUR) 30 MG 24 hr tablet Take 30 mg by mouth daily.    . Magnesium 250 MG TABS Take 2 tablets by mouth daily.    . metoprolol succinate (TOPROL-XL) 100 MG 24 hr tablet Take 2 tablets (200 mg total) by mouth daily.  Take with or immediately following a meal. 30 tablet 6  . niacin 500 MG tablet Take 500 mg by mouth daily with breakfast.    . NITROSTAT 0.4 MG SL tablet Place 0.4 mg under the tongue every 5 (five) minutes as needed for chest pain (MAX 3 TABLETS). Reported on 11/20/2015    . NON FORMULARY Wears O2 at night (2 L)    . NON FORMULARY Use as directed - Neoteric diabetic skincare    . pantoprazole (PROTONIX) 40 MG tablet Take 1 tablet (40 mg total) by mouth daily. 10 tablet 0  . potassium chloride SA (K-DUR,KLOR-CON) 20 MEQ tablet Take 1 tablet (20 mEq total) by mouth daily. 30 tablet 6  . sacubitril-valsartan (ENTRESTO) 24-26 MG Take 1 tablet by mouth daily.    [name] zolpidem (AMBIEN) 10 MG tablet Take 5 mg by mouth at bedtime.      No current facility-administered medications for this visit.     Allergies  Allergen Reactions  . Amoxicillin Itching    Itching    . Biaxin [Clarithromycin] Itching    Lip swelling     . Erythromycin Itching  . Penicillins Itching  . Shellfish Allergy Itching        . Sulfa Antibiotics Other (See Comments)    AKI and hyperkalemia    Social History   Social History  . Marital status: Married    Spouse name: N/A  . Number of children: N/A  . Years of education: N/A   Occupational History  . Not on file.   Social History Main Topics  . Smoking status: Former Smoker    Years: 52.00    Types: Cigarettes    Quit date: 08/11/2004  . Smokeless tobacco: Never Used  . Alcohol use No  . Drug use: No  . Sexual activity: Not on file   Other Topics Concern  . Not on file   Social History Narrative   Retired.  Lives in [place] with his wife.  Tries to remain active.     Review of Systems: General: negative for chills, fever, night sweats or weight changes.  Cardiovascular: negative for chest pain, dyspnea on exertion, edema, orthopnea, palpitations, paroxysmal nocturnal dyspnea or shortness of breath Dermatological: negative for rash Respiratory:  negative for cough or wheezing Urologic: negative for hematuria Abdominal: negative for nausea, vomiting, diarrhea, bright red blood per rectum, melena, or hematemesis Neurologic: negative for visual changes, syncope, or dizziness All other systems reviewed and are otherwise negative except as noted above.    Blood pressure (!) 88/50, pulse 88, height '6\' 2"'$  (1.88 m), weight 193 lb (87.5 kg).  General appearance: alert and no distress Neck: no adenopathy, no JVD, supple, symmetrical, trachea midline, thyroid not enlarged, symmetric, no tenderness/mass/nodules and Soft bilateral carotid bruits Lungs: clear to auscultation bilaterally Heart: regular rate and rhythm, S1, S2 normal, no murmur, click, rub or gallop Extremities: extremities normal, atraumatic, no cyanosis or edema  EKG not performed today  ASSESSMENT AND PLAN:   Chronic combined systolic and diastolic congestive  heart failure, NYHA class 2 Mr. Zuver has combined systolic and diastolic heart failure. He is receiving minimal ecchymosis to hospital 7/5 through 7/7 with decompensated CHF. He was diuresed. He underwent right and left heart cath by Dr. Haroldine Parsons revealing noncritical CAD with normal filling pressures. His EF was 20-25% by 2-D echo. He was also found to have aortic stenosis felt not to be severe. His breathing is slowly improving. He was transitioned to [place].  Essential hypertension History of hypertensive blood pressure measures 88/50. He is on Toprol and Entresto.  Carotid artery disease ([place]) History of carotid artery disease with recent Doppler performed 05/08/15 revealing moderate bilateral ICA stenosis. This will be followed on an annual basis.      [place] Harp MD FACP,FACC,FAHA, [facility] 12/10/2015 3:11 PM

## 2015-12-15 NOTE — Progress Notes (Signed)
Pt sch to see Gypsy Balsam, NP 8/11 and CHF clinic 8/24, will keep these appts as level is improving and pt is asymptomatic

## 2015-12-18 ENCOUNTER — Encounter: Payer: Self-pay | Admitting: Nurse Practitioner

## 2015-12-18 NOTE — Progress Notes (Signed)
    VV Optimization Note Date: 12/19/2015  ID:  Scott Parsons, DOB 1929/07/07, MRN [id number]  PCP: [name], MD Primary Cardiologist: Bensimhon Electrophysiologist: Ramond Marrow is a 82 y.o. male is seen today for Dr Johney Frame.  He presents today for AV optimization of CRT.  Since last being seen in our clinic, the patient reports that he remains fatigued and short of breath with exertion.   The patient is predominately atrially paced.  Device was programmed with adaptive CRT on presentation. Device interrogation was notable for CRT pacing of 84% 2/2 PVC's.  VV delays were evaluated from simultaneous to LV first by 50 msec.  Optimal QRS morphology and duration was noted at LV first by .  PVC's were able to be overdrive suppressed at a base rate of 70bpm.   Device was reprogrammed today to a VV delay of LV first by . Adaptive CRT was turned off. AV delays were fixed at 170/152msec.    The patient will return to see me in clinic in 4 weeks for follow up.  Will ask HF team to re-evaluate symptoms when he sees them in a couple of weeks.  If no significant improvement, would recommend adding AV optimization echo to visit with me in 4 weeks.  Will get repeat PA/Lat CXR at upcoming AHF clinic appointment.  If PVC's still persistent despite increased base rate, may need to consider AAD therapy as he is already on max dose of BB.     Signed, Gypsy Balsam, NP  12/19/2015 12:00 PM     [facility] [address] [place] [address] [phone] (office) [phone] (fax)

## 2015-12-19 ENCOUNTER — Encounter: Payer: Self-pay | Admitting: Internal Medicine

## 2015-12-19 ENCOUNTER — Ambulatory Visit (INDEPENDENT_AMBULATORY_CARE_PROVIDER_SITE_OTHER): Payer: Medicare Other | Admitting: Nurse Practitioner

## 2015-12-19 ENCOUNTER — Encounter: Payer: Self-pay | Admitting: Nurse Practitioner

## 2015-12-19 DIAGNOSIS — I779 Disorder of arteries and arterioles, unspecified: Secondary | ICD-10-CM

## 2015-12-19 DIAGNOSIS — I2583 Coronary atherosclerosis due to lipid rich plaque: Secondary | ICD-10-CM

## 2015-12-19 DIAGNOSIS — I5042 Chronic combined systolic (congestive) and diastolic (congestive) heart failure: Secondary | ICD-10-CM

## 2015-12-19 DIAGNOSIS — I5022 Chronic systolic (congestive) heart failure: Secondary | ICD-10-CM

## 2015-12-19 DIAGNOSIS — I739 Peripheral vascular disease, unspecified: Secondary | ICD-10-CM

## 2015-12-19 DIAGNOSIS — I429 Cardiomyopathy, unspecified: Secondary | ICD-10-CM | POA: Diagnosis not present

## 2015-12-19 DIAGNOSIS — I428 Other cardiomyopathies: Secondary | ICD-10-CM

## 2015-12-19 DIAGNOSIS — I2510 Atherosclerotic heart disease of native coronary artery without angina pectoris: Secondary | ICD-10-CM

## 2015-12-19 LAB — CUP PACEART INCLINIC DEVICE CHECK
Implantable Lead Implant Date: 20150507
Implantable Lead Implant Date: 20150507
Implantable Lead Implant Date: 20150507
Implantable Lead Location: 753858
Implantable Lead Location: 753860
MDC IDC LEAD LOCATION: 753859
MDC IDC LEAD MODEL: 4598
MDC IDC LEAD MODEL: 6935
MDC IDC SESS DTM: 20170811111751

## 2015-12-19 NOTE — Patient Instructions (Addendum)
Medication Instructions: Your physician recommends that you continue on your current medications as directed. Please refer to the Current Medication list given to you today.   Labwork: NONE  Procedures/Testing: A chest x-ray takes a picture of the organs and structures inside the chest, including the heart, lungs, and blood vessels. This test can show several things, including, whether the heart is enlarges; whether fluid is building up in the lungs; and whether pacemaker / defibrillator leads are still in place. - Will be until your appointment with CHF Clinic (01/01/16 @ 10:30 am.)  Follow-Up: Your physician recommends that you schedule a follow-up appointment in: 4 weeks with Gypsy Balsam NP   Any Additional Special Instructions Will Be Listed Below (If Applicable).  Leveda Anna AV optimization Echo to be scheduled. Will be determined upon CHF appointment^^      If you need a refill on your cardiac medications before your next appointment, please call your pharmacy.

## 2016-01-01 ENCOUNTER — Encounter (HOSPITAL_COMMUNITY): Payer: Self-pay

## 2016-01-01 ENCOUNTER — Ambulatory Visit (HOSPITAL_BASED_OUTPATIENT_CLINIC_OR_DEPARTMENT_OTHER)
Admission: RE | Admit: 2016-01-01 | Discharge: 2016-01-01 | Disposition: A | Discharge status: Home / Self Care | Payer: Medicare Other | Source: Ambulatory Visit | Attending: Internal Medicine | Admitting: Internal Medicine

## 2016-01-01 VITALS — BP 124/62 | HR 87 | Ht 74.0 in | Wt 198.4 lb

## 2016-01-01 DIAGNOSIS — I5042 Chronic combined systolic (congestive) and diastolic (congestive) heart failure: Secondary | ICD-10-CM

## 2016-01-01 DIAGNOSIS — I779 Disorder of arteries and arterioles, unspecified: Secondary | ICD-10-CM | POA: Diagnosis not present

## 2016-01-01 DIAGNOSIS — N183 Chronic kidney disease, stage 3 (moderate): Secondary | ICD-10-CM | POA: Insufficient documentation

## 2016-01-01 DIAGNOSIS — I5043 Acute on chronic combined systolic (congestive) and diastolic (congestive) heart failure: Secondary | ICD-10-CM | POA: Diagnosis not present

## 2016-01-01 DIAGNOSIS — Z7902 Long term (current) use of antithrombotics/antiplatelets: Secondary | ICD-10-CM

## 2016-01-01 DIAGNOSIS — Z91013 Allergy to seafood: Secondary | ICD-10-CM

## 2016-01-01 DIAGNOSIS — I739 Peripheral vascular disease, unspecified: Secondary | ICD-10-CM

## 2016-01-01 DIAGNOSIS — Z95810 Presence of automatic (implantable) cardiac defibrillator: Secondary | ICD-10-CM

## 2016-01-01 DIAGNOSIS — Z8249 Family history of ischemic heart disease and other diseases of the circulatory system: Secondary | ICD-10-CM | POA: Insufficient documentation

## 2016-01-01 DIAGNOSIS — Z79899 Other long term (current) drug therapy: Secondary | ICD-10-CM | POA: Insufficient documentation

## 2016-01-01 DIAGNOSIS — Z880 Allergy status to penicillin: Secondary | ICD-10-CM | POA: Insufficient documentation

## 2016-01-01 DIAGNOSIS — Z881 Allergy status to other antibiotic agents status: Secondary | ICD-10-CM

## 2016-01-01 DIAGNOSIS — I428 Other cardiomyopathies: Secondary | ICD-10-CM | POA: Insufficient documentation

## 2016-01-01 DIAGNOSIS — G4733 Obstructive sleep apnea (adult) (pediatric): Secondary | ICD-10-CM | POA: Insufficient documentation

## 2016-01-01 DIAGNOSIS — Z8673 Personal history of transient ischemic attack (TIA), and cerebral infarction without residual deficits: Secondary | ICD-10-CM

## 2016-01-01 DIAGNOSIS — I5022 Chronic systolic (congestive) heart failure: Secondary | ICD-10-CM | POA: Insufficient documentation

## 2016-01-01 DIAGNOSIS — Z87891 Personal history of nicotine dependence: Secondary | ICD-10-CM | POA: Insufficient documentation

## 2016-01-01 DIAGNOSIS — I2510 Atherosclerotic heart disease of native coronary artery without angina pectoris: Secondary | ICD-10-CM | POA: Insufficient documentation

## 2016-01-01 DIAGNOSIS — I6522 Occlusion and stenosis of left carotid artery: Secondary | ICD-10-CM

## 2016-01-01 DIAGNOSIS — I10 Essential (primary) hypertension: Secondary | ICD-10-CM

## 2016-01-01 DIAGNOSIS — E785 Hyperlipidemia, unspecified: Secondary | ICD-10-CM | POA: Insufficient documentation

## 2016-01-01 DIAGNOSIS — I130 Hypertensive heart and chronic kidney disease with heart failure and stage 1 through stage 4 chronic kidney disease, or unspecified chronic kidney disease: Secondary | ICD-10-CM

## 2016-01-01 DIAGNOSIS — Z882 Allergy status to sulfonamides status: Secondary | ICD-10-CM | POA: Insufficient documentation

## 2016-01-01 DIAGNOSIS — Z7982 Long term (current) use of aspirin: Secondary | ICD-10-CM

## 2016-01-01 DIAGNOSIS — Z95820 Peripheral vascular angioplasty status with implants and grafts: Secondary | ICD-10-CM

## 2016-01-01 DIAGNOSIS — N184 Chronic kidney disease, stage 4 (severe): Secondary | ICD-10-CM | POA: Diagnosis not present

## 2016-01-01 DIAGNOSIS — Z9981 Dependence on supplemental oxygen: Secondary | ICD-10-CM | POA: Diagnosis not present

## 2016-01-01 LAB — BASIC METABOLIC PANEL
Anion gap: 9 (ref 5–15)
BUN: 39 mg/dL — AB (ref 6–20)
CHLORIDE: 106 mmol/L (ref 101–111)
CO2: 24 mmol/L (ref 22–32)
Calcium: 9 mg/dL (ref 8.9–10.3)
Creatinine, Ser: 2.13 mg/dL — ABNORMAL HIGH (ref 0.61–1.24)
GFR calc Af Amer: 31 mL/min — ABNORMAL LOW (ref >60)
GFR calc non Af Amer: 27 mL/min — ABNORMAL LOW (ref >60)
Glucose, Bld: 134 mg/dL — ABNORMAL HIGH (ref 65–99)
POTASSIUM: 3.9 mmol/L (ref 3.5–5.1)
SODIUM: 139 mmol/L (ref 135–145)

## 2016-01-01 NOTE — Patient Instructions (Signed)
Routine lab work today. Will notify you of abnormal results, otherwise no news is good news!  Follow up 6-8 weeks with Dr. Gala Romney. We will call you closer to this time, or you may call our office to schedule 1 month before you are due to be seen.  Do the following things EVERYDAY: 1) Weigh yourself in the morning before breakfast. Write it down and keep it in a log. 2) Take your medicines as prescribed 3) Eat low salt foods-Limit salt (sodium) to 2000 mg per day.  4) Stay as active as you can everyday 5) Limit all fluids for the day to less than 2 liters

## 2016-01-01 NOTE — Progress Notes (Signed)
Advanced Heart Failure Medication Review by a Pharmacist  Does the patient  feel that his/her medications are working for him/her?  yes  Has the patient been experiencing any side effects to the medications prescribed?  no  Does the patient measure his/her own blood pressure or blood glucose at home?  no   Does the patient have any problems obtaining medications due to transportation or finances?   No - PAN foundation for [employer] of regimen: good Understanding of indications: good Potential of compliance: good Patient understands to avoid NSAIDs. Patient understands to avoid decongestants.  Issues to address at subsequent visits: None   Pharmacist comments:  Scott Parsons is a pleasant 82 yo M presenting with a current medication list. He reports good compliance with his regimen but I did notice that he is not taking KCl 20 meq daily. He stated that he got a 10 day supply after his last hospital discharge but it did not have refills. He did not have any other medication-related questions or concerns for me at this time.   [name] Bonnye Fava, PharmD, BCPS, CPP Clinical Pharmacist Pager: [phone] Phone: [phone] 01/01/2016 10:35 AM      Time with patient: 10 minutes Preparation and documentation time: 2 minutes Total time: 12 minutes

## 2016-01-01 NOTE — Progress Notes (Signed)
Patient ID: Scott Parsons, male   DOB: 12-20-1929, 82 y.o.   MRN: [id number]    Advanced Heart Failure Clinic Note    Primary Care: [name], MD Primary Cardiologist: Dr Allyson Sabal Primary HF: Dr. Gala Romney   HPI:  Scott Parsons is a 82 y.o. male with history of NICM, Chronic systolic CHF (Echo 11/2015 EF 20-25%), LBBB s/p biV ICD, CKD III-IV, HTN, PAD, Carotid stenosis, and RAS s/p renal stenting.   He is followed closely in the [facility] CHF clinic by Devonne Doughty. In 09/2015 was found to have worsening of renal fxn along with hyperkalemia. He was admitted to [facility] and his lisinopril, valsartan, and spironolactone were discontinued. Labs were stable upon f/u and he was subsequently placed on entresto.  Was taken to [place] ED on 11/11/15 with acute worsening dyspnea. Required Bipap. Tx to [facility] for further eval. Was admitted by CCM for resp failure. Had significant symptom improvement with diuresis. Catheterization as below with mild non-obstructive CAD and well compensated hemodynamics after diuresis. Diuresed 2.2 L. Weight 190 on discharge.   He presents today for regular follow up. Weight up 4 lbs since last visit. Has really been watching diet and fluid.  Weight has come up 2-3 lbs this past week.  Taking prednisone currently for back pain. Breathing has been OK.  Gets DOE uphill. No SOB on flat ground. He denies SOB with showering.  Denies lightheadedness or dizziness. Checks weight daily. Takes all medications, though of note he states he had a limited supply of potassium with no refills on discharge, and has been off for several weeks.   Optivol interrogation: Scott Parsons has been well above threshold since the middle of June. Thoracic impedence down. No further AT/AF alerts. Pt activity > 1 hr daily.    R/LHC 11/13/15  Mild non-obstructive CAD, and well compensated hemodynamics after diuresis.  RA = 3 RV = 36/3 PA = 34/12 (20) PCW = 12 Ao = 118/48 (72) LV = 129/18 Fick cardiac  output/index = 5.6/2.6 PVR = 1.4 WU SVR = 984 FA sat = 87% PA sat = 58%, 55%  Echo 11/13/15  LVEF 20-25% with diffuse hypokinesis. Mild AS, Moderate MR, Severe LAE   Past Medical History:  Diagnosis Date  . Carotid artery disease (HCC)    a. 04/2015 mild to mod bilat ICA stenosis;  b. 10/2015 >70% LICA stenosis, no significant dzs on right.  . Chronic combined systolic and diastolic CHF, NYHA class 4 (HCC)    a. 01/2013 Echo: EF 25-30%; b. 08/2013 Echo: EF 15-20%; c. 04/2014 Echo: EF 20-25%, mild LVH, Gr1 DD, mild MR, mod dil LA.  . CKD (chronic kidney disease), stage IV (HCC)    a. baseline creat of ~ 1.8;  b. 09/2015 Admitted to [facility] with AKI and hyperkalemia.  [name] History of stroke   . Hyperlipidemia   . Hypertensive heart disease with CHF (congestive heart failure) (HCC)   . ICD (implantable cardioverter-defibrillator), dual, in situ    a. 09/2013 s/p MDT Ovidio Kin XT CRT-D (ser #[id number]).  . LBBB (left bundle branch block)   . Non-obstructive CAD    a. 11/2001 Cath [facility]): LM nl, LAD 60m, LCX nondom, nl, RI large, nl, RCA dominant, 32m/d.  [name] Nonischemic cardiomyopathy (HCC)    a. 11/2001 nonobs cath; b. 09/2013 s/p MDT Ovidio Kin XT CRT-D (ser #[id number]); c. 04/2014 Echo: EF 20-25%;  d. Followed @ [facility] by K. Pernell Dupre, MD.  . OSA (obstructive sleep apnea)  a. Does not tolerate/use CPAP.  . Pre-diabetes   . PVD (peripheral vascular disease) ([place])    a. 10/2005 PTA: right external iliac artery angioplasty and left renal artery angioplasty and stenting;  b. 10/2015 ABI's in setting of recurrent claudication: R 0.97, L 0.77 (reduced by 0.12 from prior study).  . Renal artery stenosis ([place])    a. 10/2005 s/p L renal artery stenting.  [name] TIA (transient ischemic attack) 06/2010    Current Outpatient Prescriptions  Medication Sig Dispense Refill  . allopurinol (ZYLOPRIM) 100 MG tablet Take 100 mg by mouth daily.    [name] aspirin EC 81 MG tablet Take 81 mg by mouth daily.    [name] atorvastatin  (LIPITOR) 20 MG tablet Take 20 mg by mouth daily.    . Blood Glucose Monitoring Suppl (FIFTY50 GLUCOSE METER 2.0) w/Device KIT     . clopidogrel (PLAVIX) 75 MG tablet Take 75 mg by mouth daily.    . Coenzyme Q10 (CO Q 10) 100 MG CAPS Take 100 mg by mouth daily.    . furosemide (LASIX) 40 MG tablet Take 1 tablet (40 mg total) by mouth 2 (two) times daily. 60 tablet 6  . gabapentin (NEURONTIN) 300 MG capsule Take 300 mg by mouth at bedtime.    . isosorbide mononitrate (IMDUR) 30 MG 24 hr tablet Take 30 mg by mouth daily.    . Magnesium 250 MG TABS Take 2 tablets by mouth daily.    . metoprolol (TOPROL-XL) 200 MG 24 hr tablet Take 200 mg by mouth daily.    . niacin 500 MG tablet Take 500 mg by mouth daily with breakfast.    . NON FORMULARY Wears O2 at night (2 L)    . NON FORMULARY Use as directed - Neoteric diabetic skincare    . predniSONE (DELTASONE) 5 MG tablet Take 5-10 mg by mouth daily as needed (back pain).    . sacubitril-valsartan (ENTRESTO) 24-26 MG Take 1 tablet by mouth 2 (two) times daily.    [name] zolpidem (AMBIEN) 10 MG tablet Take 5 mg by mouth at bedtime.     [name] NITROSTAT 0.4 MG SL tablet Place 0.4 mg under the tongue every 5 (five) minutes as needed for chest pain (MAX 3 TABLETS). Reported on 11/20/2015     No current facility-administered medications for this encounter.     Allergies  Allergen Reactions  . Amoxicillin Itching    Itching    . Biaxin [Clarithromycin] Itching    Lip swelling     . Erythromycin Itching  . Penicillins Itching  . Shellfish Allergy Itching        . Sulfa Antibiotics Other (See Comments)    AKI and hyperkalemia      Social History   Social History  . Marital status: Married    Spouse name: N/A  . Number of children: N/A  . Years of education: N/A   Occupational History  . Not on file.   Social History Main Topics  . Smoking status: Former Smoker    Years: 52.00    Types: Cigarettes    Quit date: 08/11/2004  . Smokeless tobacco:  Never Used  . Alcohol use No  . Drug use: No  . Sexual activity: Not on file   Other Topics Concern  . Not on file   Social History Narrative   Retired.  Lives in [place] with his wife.  Tries to remain active.      Family History  Problem Relation  Age of Onset  . Alzheimer's disease Mother   . Heart disease Father   . Heart disease Paternal Grandfather   . Heart attack Paternal Grandfather     Vitals:   01/01/16 1017  BP: 124/62  Pulse: 87  SpO2: 97%  Weight: 198 lb 6.4 oz (90 kg)  Height: '6\' 2"'$  (1.88 m)   Wt Readings from Last 3 Encounters:  01/01/16 198 lb 6.4 oz (90 kg)  12/10/15 193 lb (87.5 kg)  11/20/15 194 lb (88 kg)     PHYSICAL EXAM: General: Elderly. No resp difficulty HEENT: normal Neck: supple. JVP 7-8. Carotids 2+ bilat; no bruits. No thyromegaly or nodule noted.  Cor: PMI nondisplaced. RRR with occasional ectopy, 2/6 MR, TR Lungs: CTAB, normal effort Abdomen: soft, NT, ND, no HSM. No bruits or masses. +BS  Extremities: no cyanosis, clubbing, rash, edema Neuro: alert & orientedx3, cranial nerves grossly intact. moves all 4 extremities w/o difficulty. Affect pleasant  EKG: AV dual paced 72 bpm with PVCs  ASSESSMENT & PLAN:  1. Chronic systolic HF,  Echo 11/15/98 EF 20-25% - NICM --s/p CRT-D - Volume status stable on exam. ReDS Vest reading 31.  - Continue lasix 40 mg BID, OK to take extra as needed.  - Is supposed to be on potassium 20 meq daily. Will check BMET today. Not taking any currently. - Continue toprol-xl 200 mg daily - Continue Entresto 24/26 mg BID. - Continue Imdur 30 mg daily.  - Reinforced importance of daily weights and to call with weight gain of 3 lbs overnight or 5 lbs in one week.  2. CKD III-IV - BMET today.  3. NSVT - Still having frequent PVCs on EKG 4. Carotid stenosis > 70% on L  5. CAD - Mild non obstructive on cath 11/2015 - Continue ASA 81 mg daily, Clopidigrel 75 mg daily,   Stable on current meds. Will  not up titrate today with advanced age and he has had soft pressures with attempts before. Labs today to determine need for potassium.    Will need follow up echo.  Likely after next visit.   6-8 week follow up with MD.   Beryle Beams" [place], PA-C 01/01/2016 10:31 AM   Total time spent > 25 minutes. Over half that spent discussing the above.

## 2016-01-03 ENCOUNTER — Inpatient Hospital Stay (HOSPITAL_COMMUNITY)
Admission: AD | Admit: 2016-01-03 | Discharge: 2016-01-07 | DRG: 291 | Disposition: A | Discharge status: Home / Self Care | Payer: Medicare Other | Source: Other Acute Inpatient Hospital | Attending: Internal Medicine | Admitting: Internal Medicine

## 2016-01-03 ENCOUNTER — Encounter (HOSPITAL_COMMUNITY): Payer: Self-pay

## 2016-01-03 DIAGNOSIS — Z8673 Personal history of transient ischemic attack (TIA), and cerebral infarction without residual deficits: Secondary | ICD-10-CM | POA: Diagnosis not present

## 2016-01-03 DIAGNOSIS — E785 Hyperlipidemia, unspecified: Secondary | ICD-10-CM | POA: Diagnosis present

## 2016-01-03 DIAGNOSIS — Z79899 Other long term (current) drug therapy: Secondary | ICD-10-CM | POA: Diagnosis not present

## 2016-01-03 DIAGNOSIS — Z7902 Long term (current) use of antithrombotics/antiplatelets: Secondary | ICD-10-CM

## 2016-01-03 DIAGNOSIS — I5023 Acute on chronic systolic (congestive) heart failure: Secondary | ICD-10-CM

## 2016-01-03 DIAGNOSIS — I2510 Atherosclerotic heart disease of native coronary artery without angina pectoris: Secondary | ICD-10-CM | POA: Diagnosis present

## 2016-01-03 DIAGNOSIS — I130 Hypertensive heart and chronic kidney disease with heart failure and stage 1 through stage 4 chronic kidney disease, or unspecified chronic kidney disease: Principal | ICD-10-CM | POA: Diagnosis present

## 2016-01-03 DIAGNOSIS — N189 Chronic kidney disease, unspecified: Secondary | ICD-10-CM

## 2016-01-03 DIAGNOSIS — J810 Acute pulmonary edema: Secondary | ICD-10-CM | POA: Diagnosis present

## 2016-01-03 DIAGNOSIS — E876 Hypokalemia: Secondary | ICD-10-CM | POA: Diagnosis present

## 2016-01-03 DIAGNOSIS — Z9981 Dependence on supplemental oxygen: Secondary | ICD-10-CM

## 2016-01-03 DIAGNOSIS — R0602 Shortness of breath: Secondary | ICD-10-CM | POA: Diagnosis present

## 2016-01-03 DIAGNOSIS — Z95810 Presence of automatic (implantable) cardiac defibrillator: Secondary | ICD-10-CM | POA: Diagnosis not present

## 2016-01-03 DIAGNOSIS — R29898 Other symptoms and signs involving the musculoskeletal system: Secondary | ICD-10-CM | POA: Diagnosis not present

## 2016-01-03 DIAGNOSIS — I428 Other cardiomyopathies: Secondary | ICD-10-CM | POA: Diagnosis present

## 2016-01-03 DIAGNOSIS — I5022 Chronic systolic (congestive) heart failure: Secondary | ICD-10-CM

## 2016-01-03 DIAGNOSIS — I5043 Acute on chronic combined systolic (congestive) and diastolic (congestive) heart failure: Secondary | ICD-10-CM | POA: Diagnosis not present

## 2016-01-03 DIAGNOSIS — R7303 Prediabetes: Secondary | ICD-10-CM | POA: Diagnosis present

## 2016-01-03 DIAGNOSIS — N179 Acute kidney failure, unspecified: Secondary | ICD-10-CM | POA: Diagnosis present

## 2016-01-03 DIAGNOSIS — Z7982 Long term (current) use of aspirin: Secondary | ICD-10-CM | POA: Diagnosis not present

## 2016-01-03 DIAGNOSIS — I6523 Occlusion and stenosis of bilateral carotid arteries: Secondary | ICD-10-CM | POA: Diagnosis present

## 2016-01-03 DIAGNOSIS — N184 Chronic kidney disease, stage 4 (severe): Secondary | ICD-10-CM | POA: Diagnosis present

## 2016-01-03 DIAGNOSIS — Z8249 Family history of ischemic heart disease and other diseases of the circulatory system: Secondary | ICD-10-CM

## 2016-01-03 DIAGNOSIS — G4733 Obstructive sleep apnea (adult) (pediatric): Secondary | ICD-10-CM | POA: Diagnosis present

## 2016-01-03 LAB — CREATININE, SERUM
CREATININE: 2.11 mg/dL — AB (ref 0.61–1.24)
GFR calc Af Amer: 32 mL/min — ABNORMAL LOW (ref >60)
GFR calc non Af Amer: 27 mL/min — ABNORMAL LOW (ref >60)

## 2016-01-03 LAB — MRSA PCR SCREENING: MRSA by PCR: NEGATIVE

## 2016-01-03 MED ORDER — NITROGLYCERIN 0.4 MG SL SUBL: 0.4000 mg | SUBLINGUAL_TABLET | SUBLINGUAL | Status: DC | PRN

## 2016-01-03 MED ORDER — ASPIRIN EC 81 MG PO TBEC
81.0000 mg | DELAYED_RELEASE_TABLET | Freq: Every day | ORAL | Status: DC
  Administered 2016-01-03 – 2016-01-07 (×5): 81 mg via ORAL
  Filled 2016-01-03 (×5): qty 1

## 2016-01-03 MED ORDER — SODIUM CHLORIDE 0.9% FLUSH
3.0000 mL | Freq: Two times a day (BID) | INTRAVENOUS | Status: DC
  Administered 2016-01-03 – 2016-01-07 (×8): 3 mL via INTRAVENOUS

## 2016-01-03 MED ORDER — ONDANSETRON HCL 4 MG/2ML IJ SOLN: 4.0000 mg | Freq: Four times a day (QID) | INTRAMUSCULAR | Status: DC | PRN

## 2016-01-03 MED ORDER — ALPRAZOLAM 0.25 MG PO TABS: 0.2500 mg | ORAL_TABLET | Freq: Two times a day (BID) | ORAL | Status: DC | PRN

## 2016-01-03 MED ORDER — SODIUM CHLORIDE 0.9 % IV SOLN: 250.0000 mL | INTRAVENOUS | Status: DC | PRN

## 2016-01-03 MED ORDER — METOPROLOL SUCCINATE ER 100 MG PO TB24
200.0000 mg | ORAL_TABLET | Freq: Every day | ORAL | Status: DC
  Administered 2016-01-04: 200 mg via ORAL
  Filled 2016-01-03: qty 2

## 2016-01-03 MED ORDER — ENOXAPARIN SODIUM 30 MG/0.3ML ~~LOC~~ SOLN
30.0000 mg | SUBCUTANEOUS | Status: DC
  Filled 2016-01-03: qty 0.3

## 2016-01-03 MED ORDER — ISOSORBIDE MONONITRATE ER 30 MG PO TB24
30.0000 mg | ORAL_TABLET | Freq: Every day | ORAL | Status: DC
  Administered 2016-01-03 – 2016-01-07 (×5): 30 mg via ORAL
  Filled 2016-01-03 (×5): qty 1

## 2016-01-03 MED ORDER — CLOPIDOGREL BISULFATE 75 MG PO TABS
75.0000 mg | ORAL_TABLET | Freq: Every day | ORAL | Status: DC
  Administered 2016-01-04 – 2016-01-07 (×4): 75 mg via ORAL
  Filled 2016-01-03 (×4): qty 1

## 2016-01-03 MED ORDER — ALLOPURINOL 100 MG PO TABS
100.0000 mg | ORAL_TABLET | Freq: Every day | ORAL | Status: DC
  Administered 2016-01-03 – 2016-01-07 (×5): 100 mg via ORAL
  Filled 2016-01-03 (×5): qty 1

## 2016-01-03 MED ORDER — SODIUM CHLORIDE 0.9% FLUSH: 3.0000 mL | INTRAVENOUS | Status: DC | PRN

## 2016-01-03 MED ORDER — SACUBITRIL-VALSARTAN 24-26 MG PO TABS
1.0000 | ORAL_TABLET | Freq: Two times a day (BID) | ORAL | Status: DC
  Administered 2016-01-03 – 2016-01-07 (×8): 1 via ORAL
  Filled 2016-01-03 (×8): qty 1

## 2016-01-03 MED ORDER — CO Q 10 100 MG PO CAPS: 100.0000 mg | ORAL_CAPSULE | Freq: Every day | ORAL | Status: DC

## 2016-01-03 MED ORDER — NIACIN 500 MG PO TABS
500.0000 mg | ORAL_TABLET | Freq: Every day | ORAL | Status: DC
  Administered 2016-01-04 – 2016-01-07 (×4): 500 mg via ORAL
  Filled 2016-01-03 (×4): qty 1

## 2016-01-03 MED ORDER — MAGNESIUM OXIDE 400 (241.3 MG) MG PO TABS
400.0000 mg | ORAL_TABLET | Freq: Every day | ORAL | Status: DC
  Administered 2016-01-03 – 2016-01-07 (×5): 400 mg via ORAL
  Filled 2016-01-03 (×5): qty 1

## 2016-01-03 MED ORDER — ATORVASTATIN CALCIUM 20 MG PO TABS
20.0000 mg | ORAL_TABLET | Freq: Every day | ORAL | Status: DC
  Administered 2016-01-03 – 2016-01-07 (×5): 20 mg via ORAL
  Filled 2016-01-03 (×5): qty 1

## 2016-01-03 MED ORDER — PREDNISONE 10 MG PO TABS: 5.0000 mg | ORAL_TABLET | Freq: Every day | ORAL | Status: DC | PRN

## 2016-01-03 MED ORDER — ZOLPIDEM TARTRATE 5 MG PO TABS: 5.0000 mg | ORAL_TABLET | Freq: Every evening | ORAL | Status: DC | PRN

## 2016-01-03 MED ORDER — GABAPENTIN 300 MG PO CAPS
300.0000 mg | ORAL_CAPSULE | Freq: Every day | ORAL | Status: DC
  Administered 2016-01-03 – 2016-01-06 (×4): 300 mg via ORAL
  Filled 2016-01-03 (×4): qty 1

## 2016-01-03 MED ORDER — ZOLPIDEM TARTRATE 5 MG PO TABS
5.0000 mg | ORAL_TABLET | Freq: Every day | ORAL | Status: DC
  Administered 2016-01-03 – 2016-01-06 (×4): 5 mg via ORAL
  Filled 2016-01-03 (×4): qty 1

## 2016-01-03 MED ORDER — MAGNESIUM 200 MG PO TABS
2.0000 | ORAL_TABLET | Freq: Every day | ORAL | Status: DC
  Filled 2016-01-03: qty 2

## 2016-01-03 MED ORDER — ACETAMINOPHEN 325 MG PO TABS: 650.0000 mg | ORAL_TABLET | ORAL | Status: DC | PRN

## 2016-01-03 MED ORDER — ENOXAPARIN SODIUM 30 MG/0.3ML ~~LOC~~ SOLN
30.0000 mg | SUBCUTANEOUS | Status: DC
  Administered 2016-01-04 – 2016-01-07 (×4): 30 mg via SUBCUTANEOUS
  Filled 2016-01-03 (×4): qty 0.3

## 2016-01-03 NOTE — Progress Notes (Signed)
Physician paged to notify of patient arrival.

## 2016-01-03 NOTE — H&P (Signed)
History and Physical   Patient ID: Scott Parsons MRN: [id number], DOB/AGE: 1929-07-16 82 y.o. Date of Encounter: 01/03/2016  Primary Physician: [name], MD Primary Cardiologist: Dr Gwenlyn Found CHF MD: Dr [name]  Chief Complaint:  CHF exacerbation  HPI: Scott Parsons is a 82 y.o. male with a history of  NICM, Chronic systolic CHF (Echo 01/6221 EF 20-25%), LBBB s/p biV ICD, CKD III-IV, HTN, PAD, Carotid stenosis, and RAS s/p renal stenting  D/C 07/07 after admit for CHF, weight 190 lbs, Lasix dose at d/c 40 mg bid with Kdur 20 meq and on Entresto 24-26, Imdur 30 mg qd and Toprol XL 200 mg qd  Seen by Dr Gwenlyn Found 08/02 and BP was 88/50, weight 193, breathing well.  Seen in EP clinic 08/14 and device adjustments made. Seen in CHF clinic 08/24 and weight was 198, no med changes made due to hx hypotension. BMET checked and BUN/Cr were above baseline  08/24 pm, pt had sudden onset of SOB at rest. Some diaphoresis, no chest pain. He went to [facility], was admitted and diuresed.   His renal function was stable and he was improving. However, this am he had another episode of SOB, felt flash pulmonary edema. He required BiPAP and at first O2 sats were in the mid-90s on BiPAP. He was given additional Lasix. He was transferred to [facility].   Currently, he is breathing much better. He is off BiPAP and    Past Medical History:  Diagnosis Date  . Carotid artery disease ([place])    a. 04/2015 mild to mod bilat ICA stenosis;  b. 01/7988 >21% LICA stenosis, no significant dzs on right.  . Chronic combined systolic and diastolic CHF, NYHA class 4 ([place])    a. 01/2013 Echo: EF 25-30%; b. 08/2013 Echo: EF 15-20%; c. 04/2014 Echo: EF 20-25%, mild LVH, Gr1 DD, mild MR, mod dil LA.  . CKD (chronic kidney disease), stage IV ([place])    a. baseline creat of ~ 1.8;  b. 09/2015 Admitted to [facility] with AKI and hyperkalemia.  [name] History of stroke   . Hyperlipidemia   . Hypertensive heart disease with CHF  (congestive heart failure) ([place])   . ICD (implantable cardioverter-defibrillator), dual, in situ    a. 09/2013 s/p MDT Hillery Aldo XT CRT-D (ser #[id number]).  . LBBB (left bundle branch block)   . Non-obstructive CAD    a. 11/2001 Cath [facility]): LM nl, LAD 45m LCX nondom, nl, RI large, nl, RCA dominant, 455m.  . Marland Kitchenonischemic cardiomyopathy ([place]   a. 11/2001 nonobs cath; b. 09/2013 s/p MDT ViHillery AldoT CRT-D (ser #B#[id number]); c. 04/2014 Echo: EF 20-25%;  d. Followed @ [facility] by K. AdAndree ElkMD.  . OSA (obstructive sleep apnea)    a. Does not tolerate/use CPAP.  . Pre-diabetes   . PVD (peripheral vascular disease) ([place]   a. 10/2005 PTA: right external iliac artery angioplasty and left renal artery angioplasty and stenting;  b. 10/2015 ABI's in setting of recurrent claudication: R 0.97, L 0.77 (reduced by 0.12 from prior study).  . Renal artery stenosis ([facility]   a. 10/2005 s/p L renal artery stenting.  . Marland KitchenIA (transient ischemic attack) 06/2010    Surgical History:  Past Surgical History:  Procedure Laterality Date  . BI-VENTRICULAR IMPLANTABLE CARDIOVERTER DEFIBRILLATOR N/A 09/13/2013   Procedure: BI-VENTRICULAR IMPLANTABLE CARDIOVERTER DEFIBRILLATOR  (CRT-D);  Surgeon: JaCoralyn MarkMD;  [facility];  Service: Cardiovascular;  Laterality: N/A;  .  BI-VENTRICULAR IMPLANTABLE CARDIOVERTER DEFIBRILLATOR  (CRT-D)  09-13-2013   MDT CRTD implanted by Dr [name]  . CARDIAC CATHETERIZATION  2003   Mild LV dysfuntion. he had apical and inferoapical wall motion abnormalities and EF of 45%.  [name] CARDIAC CATHETERIZATION  10/18/2005   STENTS: left renal artery as well as both iliac arteries. Was stented with 31m x 4129mSmart Nitinol self-expanding stent replaced with a 29m57m 4mm65mlloon at nominal pressures, resulting in a reduction od 50% proximal right external iliac artery  stenosis to 0% residual.  . CARDIAC CATHETERIZATION N/A 11/13/2015   Procedure: Right/Left Heart Cath and Coronary Angiography;   Surgeon: DaniJolaine Artist;  [facility];  Service: Cardiovascular;  Laterality: N/A;  . HAND SURGERY  01/1995  . HERNIA REPAIR       I have reviewed the patient's current medications. Medication Sig  allopurinol (ZYLOPRIM) 100 MG tablet Take 100 mg by mouth daily.  aspirin EC 81 MG tablet Take 81 mg by mouth daily.  atorvastatin (LIPITOR) 20 MG tablet Take 20 mg by mouth daily.  Blood Glucose Monitoring Suppl (FIFTY50 GLUCOSE METER 2.0) w/Device KIT   clopidogrel (PLAVIX) 75 MG tablet Take 75 mg by mouth daily.  Coenzyme Q10 (CO Q 10) 100 MG CAPS Take 100 mg by mouth daily.  furosemide (LASIX) 40 MG tablet Take 1 tablet (40 mg total) by mouth 2 (two) times daily.  gabapentin (NEURONTIN) 300 MG capsule Take 300 mg by mouth at bedtime.  isosorbide mononitrate (IMDUR) 30 MG 24 hr tablet Take 30 mg by mouth daily.  Magnesium 250 MG TABS Take 2 tablets by mouth daily.  metoprolol (TOPROL-XL) 200 MG 24 hr tablet Take 200 mg by mouth daily.  niacin 500 MG tablet Take 500 mg by mouth daily with breakfast.  NON FORMULARY Wears O2 at night (2 L)  predniSONE (DELTASONE) 5 MG tablet Take 5-10 mg by mouth daily as needed (back pain).  sacubitril-valsartan (ENTRESTO) 24-26 MG Take 1 tablet by mouth 2 (two) times daily.  zolpidem (AMBIEN) 10 MG tablet Take 5 mg by mouth at bedtime.   NITROSTAT 0.4 MG SL tablet Place 0.4 mg under the tongue every 5 (five) minutes as needed for chest pain (MAX 3 TABLETS). Reported on 11/20/2015  NON FORMULARY Use as directed - Neoteric diabetic skincare   Scheduled Meds: Continuous Infusions: PRN Meds:.  Allergies:  Allergies  Allergen Reactions  . Amoxicillin Itching    Itching    . Biaxin [Clarithromycin] Itching    Lip swelling     . Erythromycin Itching  . Penicillins Itching  . Shellfish Allergy Itching        . Sulfa Antibiotics Other (See Comments)    AKI and hyperkalemia    Social History   Social History  . Marital  status: Married    Spouse name: N/A  . Number of children: N/A  . Years of education: N/A   Occupational History  . Retired    Social History Main Topics  . Smoking status: Former Smoker    Years: 52.00    Types: Cigarettes    Quit date: 08/11/2004  . Smokeless tobacco: Never Used  . Alcohol use No  . Drug use: No  . Sexual activity: Yes   Other Topics Concern  . Not on file   Social History Narrative   Retired.  [place]  Tries to remain active.    Family History  Problem Relation  Age of Onset  . Alzheimer's disease Mother   . Heart disease Father   . Heart disease Paternal Grandfather   . Heart attack Paternal Grandfather    Family Status  Relation Status  . Mother Deceased  . Father Deceased  . Maternal Grandmother Deceased  . Maternal Grandfather Deceased  . Paternal Grandmother Deceased  . Paternal Grandfather Deceased    Review of Systems:   Full 14-point review of systems otherwise negative except as noted above.  Physical Exam: Blood pressure (!) 119/57, pulse 84, resp. rate 15, height '6\' 2"'$  (1.88 m), weight 190 lb 4.1 oz (86.3 kg), SpO2 97 %. General: Well developed, well nourished,male in no acute distress. Head: Normocephalic, atraumatic, sclera non-icteric, no xanthomas, nares are without discharge. Dentition:  Neck: No carotid bruits. JVD not elevated. No thyromegally Lungs: Good expansion bilaterally. without wheezes or rhonchi.  Heart: IRRegular rate and rhythm with S1 S2.  No S3 or S4.  No murmur, no rubs, or gallops appreciated. Abdomen: Soft, non-tender, non-distended with normoactive bowel sounds. No hepatomegaly. No rebound/guarding. No obvious abdominal masses. Msk:  Strength and tone appear normal for age. No joint deformities or effusions, no spine or costo-vertebral angle tenderness. Extremities: No clubbing or cyanosis. No edema.  Distal pedal pulses are 2+ in 4 extrem Neuro: Alert and oriented X 3. Moves all  extremities spontaneously. No focal deficits noted. Psych:  Responds to questions appropriately with a normal affect. Skin: No rashes or lesions noted  Labs:   Lab Results  Component Value Date   WBC 6.0 11/14/2015   HGB 13.0 11/14/2015   HCT 39.8 11/14/2015   MCV 100.5 (H) 11/14/2015   PLT 133 (L) 11/14/2015     Recent Labs Lab 01/01/16 1101  NA 139  K 3.9  CL 106  CO2 24  BUN 39*  CREATININE 2.13*  CALCIUM 9.0  GLUCOSE 134*   B Natriuretic Peptide  Date/Time Value Ref Range Status  11/12/2015 04:11 AM 4,293.7 (H) 0.0 - 100.0 pg/mL Final  07/11/2015 12:05 PM 240.2 (H) 0.0 - 100.0 pg/mL Final    Radiology/Studies: No results found.   Cardiac Cath: reviewed  Echo: reviewed  ECG:#1 NSR with BiV pacing,PVC's #2. Probable atrial flutter/fib vs sinus tachycardia  A/P 1. Paroxysms of severe sob - he presented with this and had another episode this morning. He could be having ischemia but not much objective for this. He may well be going out of rhythm. Will plan to interogate his device to see if he is having any atrial arrhythmias. If so would consider amiodarone. 2. Acute on chronic renal insufficiency - his creatiine is up. Unclear of etiology. Will hold off on lasix for now. Check Co-ox.  3. ICD - he is s/p BiV ICD. Will interrogate.   Mikle Bosworth.D.

## 2016-01-03 NOTE — Progress Notes (Signed)
Patient admitted to room 2H27 from [facility] with chief diagnosis of exacerbation of CHF and accompanying respiratory failure.  Patient required lasix and use of BiPap at [facility].  He is alert, oriented, and able to follow commands without cueing.  Daughter and wife arrived soon after his admission to [facility].  He was introduced to the unit staff and the unit routine.  CHF material included in admission packet.  Skin unremarkable save for slight blanchable redness with excoriation.  "I get that when I lay in bed too long", he stated.

## 2016-01-03 NOTE — Progress Notes (Signed)
PHARMACIST - PHYSICIAN ORDER COMMUNICATION  CONCERNING: P&T Medication Policy on Herbal Medications  DESCRIPTION:  This patient's order for:  Coenzyme Q  has been noted.  This product(s) is classified as an "herbal" or natural product. Due to a lack of definitive safety studies or FDA approval, nonstandard manufacturing practices, plus the potential risk of unknown drug-drug interactions while on inpatient medications, the Pharmacy and Therapeutics Committee does not permit the use of "herbal" or natural products of this type within [facility].   ACTION TAKEN: The pharmacy department is unable to verify this order at this time and your patient has been informed of this safety policy. Please reevaluate patient's clinical condition at discharge and address if the herbal or natural product(s) should be resumed at that time.   Louie Casa, PharmD, BCPS 01/03/2016, 4:29 PM

## 2016-01-03 NOTE — Progress Notes (Addendum)
Physician paged 2nd time.  Paged PA with cardiology, immediate call back. Gave her an update on patient. Cardiology service will be coming to see patient shortly.

## 2016-01-03 NOTE — Progress Notes (Signed)
Patient resting quietly in no distress. Multiple family members present throughout the afternoon. Irregular rhythm on the monitor, CCMD called once with a concern of possible 5 beat run of Vtach. Patient currently paced with a LBBB. Vitals are stable, no complaint of pain. Patient nor family have expressed any concerns nor do they have any questions at this time. Call bell is in reach, will continue to monitor.

## 2016-01-04 LAB — BASIC METABOLIC PANEL
Anion gap: 9 (ref 5–15)
BUN: 39 mg/dL — AB (ref 6–20)
CALCIUM: 8.9 mg/dL (ref 8.9–10.3)
CO2: 27 mmol/L (ref 22–32)
Chloride: 106 mmol/L (ref 101–111)
Creatinine, Ser: 1.81 mg/dL — ABNORMAL HIGH (ref 0.61–1.24)
GFR calc Af Amer: 38 mL/min — ABNORMAL LOW (ref >60)
GFR, EST NON AFRICAN AMERICAN: 33 mL/min — AB (ref >60)
Glucose, Bld: 122 mg/dL — ABNORMAL HIGH (ref 65–99)
POTASSIUM: 3.2 mmol/L — AB (ref 3.5–5.1)
SODIUM: 142 mmol/L (ref 135–145)

## 2016-01-04 LAB — PROTIME-INR
INR: 1.07
Prothrombin Time: 13.9 seconds (ref 11.4–15.2)

## 2016-01-04 LAB — TSH: TSH: 1.912 u[IU]/mL (ref 0.350–4.500)

## 2016-01-04 LAB — MAGNESIUM: MAGNESIUM: 2 mg/dL (ref 1.7–2.4)

## 2016-01-04 MED ORDER — METOPROLOL SUCCINATE ER 100 MG PO TB24
100.0000 mg | ORAL_TABLET | Freq: Every day | ORAL | Status: DC
  Administered 2016-01-05 – 2016-01-07 (×3): 100 mg via ORAL
  Filled 2016-01-04 (×3): qty 1

## 2016-01-04 MED ORDER — POTASSIUM CHLORIDE CRYS ER 20 MEQ PO TBCR
40.0000 meq | EXTENDED_RELEASE_TABLET | Freq: Once | ORAL | Status: AC
  Administered 2016-01-04: 40 meq via ORAL
  Filled 2016-01-04: qty 2

## 2016-01-04 MED ORDER — FUROSEMIDE 10 MG/ML IJ SOLN
80.0000 mg | Freq: Two times a day (BID) | INTRAMUSCULAR | Status: DC
  Administered 2016-01-04 – 2016-01-07 (×6): 80 mg via INTRAVENOUS
  Filled 2016-01-04 (×6): qty 8

## 2016-01-04 MED ORDER — SPIRONOLACTONE 25 MG PO TABS
12.5000 mg | ORAL_TABLET | Freq: Every day | ORAL | Status: DC
  Administered 2016-01-04: 12.5 mg via ORAL
  Filled 2016-01-04: qty 1

## 2016-01-04 NOTE — Progress Notes (Signed)
Patient ID: ASEAN INGHRAM, male   DOB: 07/06/1929, 82 y.o.   MRN: [id number]    Patient Name: Scott Parsons Date of Encounter: 01/04/2016     Active Problems:   Acute on chronic combined systolic and diastolic CHF (congestive heart failure) (HCC)   Pulmonary edema    SUBJECTIVE  Denies chest pain or shortness of breath. No palpitations  CURRENT MEDS . allopurinol  100 mg Oral Daily  . aspirin EC  81 mg Oral Daily  . atorvastatin  20 mg Oral Daily  . clopidogrel  75 mg Oral Daily  . enoxaparin (LOVENOX) injection  30 mg Subcutaneous Q24H  . gabapentin  300 mg Oral QHS  . isosorbide mononitrate  30 mg Oral Daily  . magnesium oxide  400 mg Oral Daily  . metoprolol  200 mg Oral Daily  . niacin  500 mg Oral Q breakfast  . sacubitril-valsartan  1 tablet Oral BID  . sodium chloride flush  3 mL Intravenous Q12H  . zolpidem  5 mg Oral QHS    OBJECTIVE  Vitals:   01/04/16 0324 01/04/16 0700 01/04/16 0800 01/04/16 0835  BP: 122/72   137/85  Pulse:      Resp: 17 17 17 20   Temp:    98.2 F (36.8 C)  TempSrc:    Oral  SpO2: 100% 97% 97% 95%  Weight:      Height:        Intake/Output Summary (Last 24 hours) at 01/04/16 1115 Last data filed at 01/04/16 0830  Gross per 24 hour  Intake              360 ml  Output             1100 ml  Net             -740 ml   Filed Weights   01/03/16 1200 01/04/16 0314  Weight: 190 lb 4.1 oz (86.3 kg) 188 lb 7.9 oz (85.5 kg)    PHYSICAL EXAM  General: Pleasant, 82 year old man, NAD. Neuro: Alert and oriented X 3. Moves all extremities spontaneously. Psych: Normal affect. HEENT:  Normal  Neck: Supple without bruits or JVD. Lungs:  Resp regular and unlabored, CTA. Heart: IRRR no s3, s4, or murmurs. Abdomen: Soft, non-tender, non-distended, BS + x 4.  Extremities: No clubbing, cyanosis or edema. DP/PT/Radials 2+ and equal bilaterally.  Accessory Clinical Findings  CBC No results for input(s): WBC, NEUTROABS, HGB, HCT, MCV, PLT  in the last 72 hours. Basic Metabolic Panel  Recent Labs  01/01/16 1101 01/03/16 1848 01/04/16 0517  NA 139  --  142  K 3.9  --  3.2*  CL 106  --  106  CO2 24  --  27  GLUCOSE 134*  --  122*  BUN 39*  --  39*  CREATININE 2.13* 2.11* 1.81*  CALCIUM 9.0  --  8.9  MG  --   --  2.0   Liver Function Tests No results for input(s): AST, ALT, ALKPHOS, BILITOT, PROT, ALBUMIN in the last 72 hours. No results for input(s): LIPASE, AMYLASE in the last 72 hours. Cardiac Enzymes No results for input(s): CKTOTAL, CKMB, CKMBINDEX, TROPONINI in the last 72 hours. BNP Invalid input(s): POCBNP D-Dimer No results for input(s): DDIMER in the last 72 hours. Hemoglobin A1C No results for input(s): HGBA1C in the last 72 hours. Fasting Lipid Panel No results for input(s): CHOL, HDL, LDLCALC, TRIG, CHOLHDL, LDLDIRECT in the last 72 hours. Thyroid  Function Tests  Recent Labs  01/04/16 0350  TSH 1.912    TELE  Normal sinus rhythm with frequent PACs and PVCs with biventricular pacing  Radiology/Studies  No results found.  ASSESSMENT AND PLAN  1. Acute shortness of breath - I initially thought the patient was having paroxysms of atrial fibrillation and flutter. On interrogation of his defibrillator however he is not. Coronary ischemia could be playing a role. In addition, he is volume overloaded by his device interrogation. We'll ask his heart failure specialist to make additional recommendations about diuretic therapy. 2. Acute on chronic renal sufficiency - today his kidney function is improved. Lasix has been held. We'll plan to follow. 3. Biventricular ICD - interrogation of his device by me today demonstrates no atrial arrhythmias, no ventricular arrhythmias other than PVCs and PACs, and overall normal device function. He is pending echo guided AV and VV optimization.  Jony Ladnier,M.D.  01/04/2016 9:27 AM

## 2016-01-04 NOTE — Progress Notes (Signed)
Advanced Heart Failure Rounding Note   Subjective:     He was seen in clinic on 8/24. Volume status felt to be OK with ReDS reading of 31%. 08/24 pm, pt had sudden onset of SOB at rest. Some diaphoresis, no chest pain. He went to [facility], was admitted and diuresed. His renal function was stable and he was improving. However, he had another episode of SOB, felt flash pulmonary edema. He required BiPAP and at first O2 sats were in the mid-90s on BiPAP. He was given additional Lasix. He was transferred to [facility].   Feels better this am. Creatinine improved from 2.1-> 1.8 (back to baseline) Denies dyspnea.   Seen by Dr. Ladona Ridgel this am and had normal ICD function. HF team asked to manage diuretics.   R/l heart cath 7/17  Conclusion    Mid RCA lesion, 30% stenosed.  RPDA lesion, 30% stenosed.  Ost LM lesion, 20% stenosed.  Mid LAD lesion, 40% stenosed.   Findings:  RA = 3 RV = 36/3 PA = 34/12 (20) PCW = 12 Ao = 118/48 (72) LV = 129/18 Fick cardiac output/index = 5.6/2.6 PVR = 1.4 WU SVR = 984 FA sat = 87% PA sat = 58%, 55%    Objective:   Weight Range:  Vital Signs:   Temp:  [97.4 F (36.3 C)-98.2 F (36.8 C)] 98.1 F (36.7 C) (08/27 1254) Pulse Rate:  [72-106] 106 (08/27 0930) Resp:  [11-24] 20 (08/27 1254) BP: (116-138)/(41-91) 123/59 (08/27 1254) SpO2:  [93 %-100 %] 95 % (08/27 1254) Weight:  [85.5 kg (188 lb 7.9 oz)] 85.5 kg (188 lb 7.9 oz) (08/27 0314) Last BM Date: 01/03/16  Weight change: Filed Weights   01/03/16 1200 01/04/16 0314  Weight: 86.3 kg (190 lb 4.1 oz) 85.5 kg (188 lb 7.9 oz)    Intake/Output:   Intake/Output Summary (Last 24 hours) at 01/04/16 1503 Last data filed at 01/04/16 1300  Gross per 24 hour  Intake              843 ml  Output              900 ml  Net              -57 ml     Physical Exam: General:  Elderly lying in be NAD No resp difficulty HEENT: normal Neck: supple. JVP to jaw . Carotids 2+ bilat; no  bruits. No lymphadenopathy or thryomegaly appreciated. Cor: PMI laterally displaced. Regular rate & rhythm. +s3 Lungs: clear Abdomen: soft, nontender, nondistended. No hepatosplenomegaly. No bruits or masses. Good bowel sounds. Extremities: no cyanosis, clubbing, rash, tr edema Neuro: alert & orientedx3, cranial nerves grossly intact. moves all 4 extremities w/o difficulty. Affect pleasant  Telemetry: NSR with biv pacing. + PVCs  Labs: Basic Metabolic Panel:  Recent Labs Lab 01/01/16 1101 01/03/16 1848 01/04/16 0517  NA 139  --  142  K 3.9  --  3.2*  CL 106  --  106  CO2 24  --  27  GLUCOSE 134*  --  122*  BUN 39*  --  39*  CREATININE 2.13* 2.11* 1.81*  CALCIUM 9.0  --  8.9  MG  --   --  2.0    Liver Function Tests: No results for input(s): AST, ALT, ALKPHOS, BILITOT, PROT, ALBUMIN in the last 168 hours. No results for input(s): LIPASE, AMYLASE in the last 168 hours. No results for input(s): AMMONIA in the last 168 hours.  CBC: No results for input(s): WBC, NEUTROABS, HGB, HCT, MCV, PLT in the last 168 hours.  Cardiac Enzymes: No results for input(s): CKTOTAL, CKMB, CKMBINDEX, TROPONINI in the last 168 hours.  BNP: BNP (last 3 results)  Recent Labs  04/15/15 1352 07/11/15 1205 11/12/15 0411  BNP 173.3* 240.2* 4,293.7*    ProBNP (last 3 results) No results for input(s): PROBNP in the last 8760 hours.    Other results:  Imaging:  No results found.   Medications:     Scheduled Medications: . allopurinol  100 mg Oral Daily  . aspirin EC  81 mg Oral Daily  . atorvastatin  20 mg Oral Daily  . clopidogrel  75 mg Oral Daily  . enoxaparin (LOVENOX) injection  30 mg Subcutaneous Q24H  . gabapentin  300 mg Oral QHS  . isosorbide mononitrate  30 mg Oral Daily  . magnesium oxide  400 mg Oral Daily  . metoprolol  200 mg Oral Daily  . niacin  500 mg Oral Q breakfast  . sacubitril-valsartan  1 tablet Oral BID  . sodium chloride flush  3 mL Intravenous Q12H   . zolpidem  5 mg Oral QHS     Infusions:     PRN Medications:  sodium chloride, acetaminophen, ALPRAZolam, nitroGLYCERIN, ondansetron (ZOFRAN) IV, predniSONE, sodium chloride flush, zolpidem   Assessment:   1. Acute on chronic systolic HF due to NICM EF 20-25% 2. 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 LBBB s/p CRT-D 4. PAD 5. Hypokalemia   Plan/Discussion:    JVP still up. Renal function improved. Will resume lasix 80 IV bid. Supp K+. Will add back spiro. Cut Toprol to 100 daily. Continue Entresto.   PT consult.   Length of Stay: 1   Bensimhon, Daniel MD 01/04/2016, 3:03 PM  Advanced Heart Failure Team Pager [phone] (M-F; 7a - 4p)  Please contact [facility] for night-coverage after hours (4p -7a ) and weekends on [email or web address]

## 2016-01-05 DIAGNOSIS — R29898 Other symptoms and signs involving the musculoskeletal system: Secondary | ICD-10-CM

## 2016-01-05 DIAGNOSIS — J810 Acute pulmonary edema: Secondary | ICD-10-CM

## 2016-01-05 LAB — BASIC METABOLIC PANEL
Anion gap: 8 (ref 5–15)
BUN: 36 mg/dL — AB (ref 6–20)
CALCIUM: 9 mg/dL (ref 8.9–10.3)
CO2: 29 mmol/L (ref 22–32)
Chloride: 106 mmol/L (ref 101–111)
Creatinine, Ser: 1.78 mg/dL — ABNORMAL HIGH (ref 0.61–1.24)
GFR calc Af Amer: 39 mL/min — ABNORMAL LOW (ref >60)
GFR, EST NON AFRICAN AMERICAN: 34 mL/min — AB (ref >60)
GLUCOSE: 141 mg/dL — AB (ref 65–99)
POTASSIUM: 3.5 mmol/L (ref 3.5–5.1)
Sodium: 143 mmol/L (ref 135–145)

## 2016-01-05 MED ORDER — POTASSIUM CHLORIDE CRYS ER 20 MEQ PO TBCR
40.0000 meq | EXTENDED_RELEASE_TABLET | Freq: Once | ORAL | Status: AC
  Administered 2016-01-05: 40 meq via ORAL
  Filled 2016-01-05: qty 2

## 2016-01-05 MED ORDER — SPIRONOLACTONE 25 MG PO TABS
25.0000 mg | ORAL_TABLET | Freq: Every day | ORAL | Status: DC
Start: 2016-01-05 — End: 2016-01-07
  Administered 2016-01-05 – 2016-01-07 (×3): 25 mg via ORAL
  Filled 2016-01-05 (×2): qty 1

## 2016-01-05 NOTE — Evaluation (Signed)
Physical Therapy Evaluation Patient Details Name: Scott Parsons MRN: [id number] DOB: 1929/08/17 Today's Date: 01/05/2016   History of Present Illness  Pt admitted for Acute on chronic combined systolic and diastolic CHF (congestive heart failure) (HCC) and pulmonary edema.    Clinical Impression  Pt admitted with above diagnosis. Pt currently with functional limitations due to the deficits listed below (see PT Problem List). Pt able to ambulate on unit with fair balance.  Pt does not feel at baseline and may benefit from a rollator.  Pt unsure and doesn't feel like he will need a device.  Wife at home and he goes to Cardiac rehab 3 x week.  Will follow acutely.  Pt will benefit from skilled PT to increase their independence and safety with mobility to allow discharge to the venue listed below.      Follow Up Recommendations No PT follow up;Supervision/Assistance - 24 hour (Pt does [facility] rehab and [facility]Utpt at times for shoulder)    Equipment Recommendations  Other (comment) (possibly a rollator)    Recommendations for Other Services       Precautions / Restrictions Precautions Precautions: Fall Restrictions Weight Bearing Restrictions: No      Mobility  Bed Mobility Overal bed mobility: Independent                Transfers Overall transfer level: Needs assistance Equipment used: None Transfers: Sit to/from Stand Sit to Stand: Min guard         General transfer comment: guard asssit for safety.  Ambulation/Gait Ambulation/Gait assistance: Min assist;Min guard Ambulation Distance (Feet): 320 Feet Assistive device: None Gait Pattern/deviations: Step-through pattern;Decreased stride length;Decreased step length - right;Decreased step length - left   Gait velocity interpretation: Below normal speed for age/gender General Gait Details: Pt was able to ambulate with slow guarded gait but got better the longer he ambulated.  Pt reports he feels he is at 50% therefore  limited quite a bit from baseline.  Discussed that he may benefit from a rollator but pt states he doesnt' think he will need one.  Will follow up and determine best d/c needs.   Stairs            Wheelchair Mobility    Modified Rankin (Stroke Patients Only)       Balance Overall balance assessment: Needs assistance Sitting-balance support: No upper extremity supported;Feet supported Sitting balance-Leahy Scale: Fair     Standing balance support: No upper extremity supported;During functional activity Standing balance-Leahy Scale: Fair Standing balance comment: can stand statically wihtout UE support however cannot withstand min to mod challenges dynamically                             Pertinent Vitals/Pain Pain Assessment: No/denies pain  O2 sats on RA 94-100%.  Left O2 off and notified nursing as pt only wears 2LO2 at home at night.     Home Living Family/patient expects to be discharged to:: Private residence Living Arrangements: Spouse/significant other Available Help at Discharge: Family;Available PRN/intermittently Type of Home: House Home Access: Stairs to enter Entrance Stairs-Rails: None Entrance Stairs-Number of Steps: 2 Home Layout: One level Home Equipment: Cane - single point      Prior Function Level of Independence: Needs assistance   Gait / Transfers Assistance Needed: independent, limited due to PVD per patient to couple hundred yards.  ADL's / Homemaking Assistance Needed: gets assist with dressing sometimes  Hand Dominance        Extremity/Trunk Assessment   Upper Extremity Assessment: Defer to OT evaluation           Lower Extremity Assessment: Generalized weakness      Cervical / Trunk Assessment: Normal  Communication   Communication: No difficulties  Cognition Arousal/Alertness: Awake/alert Behavior During Therapy: WFL for tasks assessed/performed Overall Cognitive Status: Within Functional Limits for  tasks assessed                      General Comments      Exercises        Assessment/Plan    PT Assessment Patient needs continued PT services  PT Diagnosis Generalized weakness   PT Problem List Decreased activity tolerance;Decreased balance;Decreased mobility;Decreased knowledge of use of DME;Decreased safety awareness;Decreased knowledge of precautions  PT Treatment Interventions DME instruction;Gait training;Functional mobility training;Stair training;Therapeutic activities;Therapeutic exercise;Balance training;Patient/family education   PT Goals (Current goals can be found in the Care Plan section) Acute Rehab PT Goals Patient Stated Goal: to go home PT Goal Formulation: With patient Time For Goal Achievement: 01/19/16 Potential to Achieve Goals: Good    Frequency Min 3X/week   Barriers to discharge        Co-evaluation               End of Session Equipment Utilized During Treatment: Gait belt;Oxygen Activity Tolerance: Patient limited by fatigue Patient left: in chair;with call bell/phone within reach;with chair alarm set Nurse Communication: Mobility status         Time: [id number] PT Time Calculation (min) (ACUTE ONLY): 23 min   Charges:   PT Evaluation $PT Eval Moderate Complexity: 1 Procedure PT Treatments $Gait Training: 8-22 mins   PT G CodesBerline Lopes:        Johnie Makki F 01/05/2016, 4:34 PM Aries Townley,PT Acute Rehabilitation [phone] (pager)

## 2016-01-05 NOTE — Progress Notes (Signed)
Advanced Heart Failure Rounding Note   Subjective:     He was seen in clinic on 8/24. Volume status felt to be OK with ReDS reading of 31%. 08/24 pm, pt had sudden onset of SOB at rest. Some diaphoresis, no chest pain. He went to [facility], was admitted and diuresed. His renal function was stable and he was improving. However, he had another episode of SOB, felt flash pulmonary edema. He required BiPAP and at first O2 sats were in the mid-90s on BiPAP. He was given additional Lasix. He was transferred to [facility].   Normal ICD function per Dr. Ladona Ridgel 01/04/16  Feels good today.  Denies dietary non-compliance at home. States he has been eating a lot of tomato sandwiches, cantaloupe, and cucumbers at home. Denies any further SOB. No CP, lightheadedness, or dizziness.   Out 1.3 L and down 3 lbs. Creatinine 2.1 -> 1.8 -> 1.7. K 3.5.   R/L heart cath 7/17 Conclusion   Mid RCA lesion, 30% stenosed.  RPDA lesion, 30% stenosed.  Ost LM lesion, 20% stenosed.  Mid LAD lesion, 40% stenosed.  Findings: RA = 3 RV = 36/3 PA = 34/12 (20) PCW = 12 Ao = 118/48 (72) LV = 129/18 Fick cardiac output/index = 5.6/2.6 PVR = 1.4 WU SVR = 984 FA sat = 87% PA sat = 58%, 55%    Objective:   Weight Range:  Vital Signs:   Temp:  [98 F (36.7 C)-98.1 F (36.7 C)] 98.1 F (36.7 C) (08/28 0809) Pulse Rate:  [69-106] 69 (08/28 0356) Resp:  [13-28] 21 (08/28 0356) BP: (120-138)/(57-91) 124/57 (08/28 0356) SpO2:  [94 %-99 %] 96 % (08/28 0356) Weight:  [185 lb 10 oz (84.2 kg)] 185 lb 10 oz (84.2 kg) (08/28 0356) Last BM Date: 01/04/16  Weight change: Filed Weights   01/03/16 1200 01/04/16 0314 01/05/16 0356  Weight: 190 lb 4.1 oz (86.3 kg) 188 lb 7.9 oz (85.5 kg) 185 lb 10 oz (84.2 kg)    Intake/Output:   Intake/Output Summary (Last 24 hours) at 01/05/16 0925 Last data filed at 01/05/16 0500  Gross per 24 hour  Intake              843 ml  Output             2326 ml  Net             -1483 ml     Physical Exam: General:  Elderly lying in be NAD No resp difficulty HEENT: normal Neck: supple. JVP 9-10 cm. Carotids 2+ bilat; no bruits. No thyromegaly or nodule noted.  Cor: PMI laterally displaced. RRR. +s3 Lungs: Diminished basilar sounds, scant crackles.  Abdomen: soft, NT, ND, no HSM. No bruits or masses. +BS  Extremities: no cyanosis, clubbing, rash, trace ankle edema Neuro: alert & orientedx3, cranial nerves grossly intact. moves all 4 extremities w/o difficulty. Affect pleasant  Telemetry: Reviewed personally, NSR with biv pacing. Occasional PVCs  Labs: Basic Metabolic Panel:  Recent Labs Lab 01/01/16 1101 01/03/16 1848 01/04/16 0517 01/05/16 0421  NA 139  --  142 143  K 3.9  --  3.2* 3.5  CL 106  --  106 106  CO2 24  --  27 29  GLUCOSE 134*  --  122* 141*  BUN 39*  --  39* 36*  CREATININE 2.13* 2.11* 1.81* 1.78*  CALCIUM 9.0  --  8.9 9.0  MG  --   --  2.0  --  Liver Function Tests: No results for input(s): AST, ALT, ALKPHOS, BILITOT, PROT, ALBUMIN in the last 168 hours. No results for input(s): LIPASE, AMYLASE in the last 168 hours. No results for input(s): AMMONIA in the last 168 hours.  CBC: No results for input(s): WBC, NEUTROABS, HGB, HCT, MCV, PLT in the last 168 hours.  Cardiac Enzymes: No results for input(s): CKTOTAL, CKMB, CKMBINDEX, TROPONINI in the last 168 hours.  BNP: BNP (last 3 results)  Recent Labs  04/15/15 1352 07/11/15 1205 11/12/15 0411  BNP 173.3* 240.2* 4,293.7*    ProBNP (last 3 results) No results for input(s): PROBNP in the last 8760 hours.    Other results:  Imaging: No results found.   Medications:     Scheduled Medications: . allopurinol  100 mg Oral Daily  . aspirin EC  81 mg Oral Daily  . atorvastatin  20 mg Oral Daily  . clopidogrel  75 mg Oral Daily  . enoxaparin (LOVENOX) injection  30 mg Subcutaneous Q24H  . furosemide  80 mg Intravenous BID  . gabapentin  300 mg Oral QHS    . isosorbide mononitrate  30 mg Oral Daily  . magnesium oxide  400 mg Oral Daily  . metoprolol  100 mg Oral Daily  . niacin  500 mg Oral Q breakfast  . sacubitril-valsartan  1 tablet Oral BID  . sodium chloride flush  3 mL Intravenous Q12H  . spironolactone  12.5 mg Oral Daily  . zolpidem  5 mg Oral QHS    Infusions:    PRN Medications: sodium chloride, acetaminophen, ALPRAZolam, nitroGLYCERIN, ondansetron (ZOFRAN) IV, predniSONE, sodium chloride flush, zolpidem   Assessment:   1. Acute on chronic systolic HF due to NICM EF 20-25% 2. 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 LBBB s/p CRT-D 4. PAD 5. Hypokalemia   Plan/Discussion:    JVP remains elevated. Renal function stable to improved. Continue lasix 80 IV bid for today. Supp K+ gently. Will also increase spiro to 25 mg daily.    Continue Toprol 100 mg daily. Continue Entresto.   PT consult pending.   Length of Stay: 2  Luane SchoolMichael Andrew Tillery, PA-C 01/05/2016, 9:25 AM  Advanced Heart Failure Team Pager [phone] (M-F; 7a - 4p)  Please contact [facility] for night-coverage after hours (4p -7a ) and weekends on [email or web address]  Patient seen and examined with Otilio SaberAndy Tillery, PA-C. We discussed all aspects of the encounter. I agree with the assessment and plan as stated above.   Renal function and creatinine improving with IV lasix. B-blocker cut back yesterday. Will continue IV diuresis. Watch renal function and electrolytes. Supp K+. CR to see.   Merrissa Giacobbe,MD 11:33 AM

## 2016-01-05 NOTE — Progress Notes (Signed)
Pt had 31 beat run of Vtach around 1700. Nurse was at bedside. Asymptomatic. AICD kicked in and pt went back to pace mode. Notified night shift as well. Will continue to monitor pt closely. Jillyn Hidden, RN

## 2016-01-06 LAB — BASIC METABOLIC PANEL
Anion gap: 9 (ref 5–15)
BUN: 38 mg/dL — AB (ref 6–20)
CHLORIDE: 105 mmol/L (ref 101–111)
CO2: 29 mmol/L (ref 22–32)
Calcium: 9 mg/dL (ref 8.9–10.3)
Creatinine, Ser: 1.84 mg/dL — ABNORMAL HIGH (ref 0.61–1.24)
GFR calc Af Amer: 37 mL/min — ABNORMAL LOW (ref >60)
GFR calc non Af Amer: 32 mL/min — ABNORMAL LOW (ref >60)
GLUCOSE: 154 mg/dL — AB (ref 65–99)
POTASSIUM: 3.3 mmol/L — AB (ref 3.5–5.1)
Sodium: 143 mmol/L (ref 135–145)

## 2016-01-06 MED ORDER — ORAL CARE MOUTH RINSE
15.0000 mL | Freq: Two times a day (BID) | OROMUCOSAL | Status: DC
  Administered 2016-01-06 – 2016-01-07 (×2): 15 mL via OROMUCOSAL

## 2016-01-06 MED ORDER — POTASSIUM CHLORIDE CRYS ER 20 MEQ PO TBCR
40.0000 meq | EXTENDED_RELEASE_TABLET | Freq: Once | ORAL | Status: AC
  Administered 2016-01-06: 40 meq via ORAL
  Filled 2016-01-06: qty 2

## 2016-01-06 NOTE — Progress Notes (Signed)
Md notified of pt's potassium of 3.3 new orders received. Will continue to monitor Scott Parsons

## 2016-01-06 NOTE — Progress Notes (Signed)
Advanced Heart Failure Rounding Note    Subjective:    Admitted with flash pulmonary edema requiring BiPAP and IV lasix.   Normal ICD function per Dr. Ladona Ridgel 01/04/16  Feeling good today.  Into chair, OOB without difficulty. No dyspnea, PND.  Out 1.8 L and down 4 more lbs. Creatinine 2.1 -> 1.8 -> 1.7 -> 1.84. K 3.3.   R/L heart cath 7/17 Conclusion   Mid RCA lesion, 30% stenosed.  RPDA lesion, 30% stenosed.  Ost LM lesion, 20% stenosed.  Mid LAD lesion, 40% stenosed.  Findings: RA = 3 RV = 36/3 PA = 34/12 (20) PCW = 12 Ao = 118/48 (72) LV = 129/18 Fick cardiac output/index = 5.6/2.6 PVR = 1.4 WU SVR = 984 FA sat = 87% PA sat = 58%, 55%    Objective:   Weight Range:  Vital Signs:   Temp:  [97.3 F (36.3 C)-98.9 F (37.2 C)] 97.9 F (36.6 C) (08/29 0806) Pulse Rate:  [75] 75 (08/29 0806) Resp:  [14-26] 21 (08/29 0806) BP: (99-127)/(47-63) 127/58 (08/29 0806) SpO2:  [90 %-100 %] 98 % (08/29 0806) Weight:  [181 lb 14.4 oz (82.5 kg)] 181 lb 14.4 oz (82.5 kg) (08/29 0301) Last BM Date: 01/05/16  Weight change: Filed Weights   01/04/16 0314 01/05/16 0356 01/06/16 0301  Weight: 188 lb 7.9 oz (85.5 kg) 185 lb 10 oz (84.2 kg) 181 lb 14.4 oz (82.5 kg)    Intake/Output:   Intake/Output Summary (Last 24 hours) at 01/06/16 0824 Last data filed at 01/06/16 0301  Gross per 24 hour  Intake              480 ml  Output             2125 ml  Net            -1645 ml     Physical Exam: General:  Elderly sitting in bed NAD No resp difficulty HEENT: normal Neck: supple. JVP 8 cm. Carotids 2+ bilat; no bruits. No thyromegaly or nodule noted.  Cor: PMI laterally displaced. RRR. +s3 Lungs: Diminished basilar sounds Abdomen: soft, NT, ND, no HSM. No bruits or masses. +BS  Extremities: no cyanosis, clubbing, rash, trace ankle edema Neuro: alert & orientedx3, cranial nerves grossly intact. moves all 4 extremities w/o difficulty. Affect pleasant  Telemetry:  Reviewed, NSR with BiV pacing.   Labs: Basic Metabolic Panel:  Recent Labs Lab 01/01/16 1101 01/03/16 1848 01/04/16 0517 01/05/16 0421 01/06/16 0248  NA 139  --  142 143 143  K 3.9  --  3.2* 3.5 3.3*  CL 106  --  106 106 105  CO2 24  --  27 29 29   GLUCOSE 134*  --  122* 141* 154*  BUN 39*  --  39* 36* 38*  CREATININE 2.13* 2.11* 1.81* 1.78* 1.84*  CALCIUM 9.0  --  8.9 9.0 9.0  MG  --   --  2.0  --   --     Liver Function Tests: No results for input(s): AST, ALT, ALKPHOS, BILITOT, PROT, ALBUMIN in the last 168 hours. No results for input(s): LIPASE, AMYLASE in the last 168 hours. No results for input(s): AMMONIA in the last 168 hours.  CBC: No results for input(s): WBC, NEUTROABS, HGB, HCT, MCV, PLT in the last 168 hours.  Cardiac Enzymes: No results for input(s): CKTOTAL, CKMB, CKMBINDEX, TROPONINI in the last 168 hours.  BNP: BNP (last 3 results)  Recent Labs  04/15/15 1352 07/11/15 1205  11/12/15 0411  BNP 173.3* 240.2* 4,293.7*    ProBNP (last 3 results) No results for input(s): PROBNP in the last 8760 hours.    Other results:  Imaging: No results found.   Medications:     Scheduled Medications: . allopurinol  100 mg Oral Daily  . aspirin EC  81 mg Oral Daily  . atorvastatin  20 mg Oral Daily  . clopidogrel  75 mg Oral Daily  . enoxaparin (LOVENOX) injection  30 mg Subcutaneous Q24H  . furosemide  80 mg Intravenous BID  . gabapentin  300 mg Oral QHS  . isosorbide mononitrate  30 mg Oral Daily  . magnesium oxide  400 mg Oral Daily  . metoprolol  100 mg Oral Daily  . niacin  500 mg Oral Q breakfast  . sacubitril-valsartan  1 tablet Oral BID  . sodium chloride flush  3 mL Intravenous Q12H  . spironolactone  25 mg Oral Daily  . zolpidem  5 mg Oral QHS    Infusions:    PRN Medications: sodium chloride, acetaminophen, ALPRAZolam, nitroGLYCERIN, ondansetron (ZOFRAN) IV, predniSONE, sodium chloride flush, zolpidem   Assessment:   1.  Acute on chronic systolic HF due to NICM EF 20-25% 2. 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 LBBB s/p CRT-D 4. PAD 5. Hypokalemia   Plan/Discussion:    Still with mild volume overload. Continue IV lasix today. K supplemented. Continue spiro 25 mg daily.    Continue Toprol 100 mg daily. Continue Entresto.   PT without recommendation for follow up. Does cardiac rehab as outpatient.  Likely home tomorrow.   Length of Stay: 3  Luane SchoolMichael Andrew Tillery, PA-C 01/06/2016, 8:24 AM  Advanced Heart Failure Team Pager [phone] (M-F; 7a - 4p)  Please contact [facility] for night-coverage after hours (4p -7a ) and weekends on [email or web address]  Patient seen and examined with Otilio SaberAndy Tillery, PA-C. We discussed all aspects of the encounter. I agree with the assessment and plan as stated above.   He continues to improve. Volume status looks much better. Renal function stable. CVP 8-9. Wil continue IV diuresis one more day and plan tentative discharge tomorrow. Will measure optivol and BNP tomorrow prior to d/c.   Meda Dudzinski,MD 11:57 AM

## 2016-01-07 ENCOUNTER — Encounter (HOSPITAL_COMMUNITY): Payer: Self-pay | Admitting: *Deleted

## 2016-01-07 LAB — BASIC METABOLIC PANEL
ANION GAP: 12 (ref 5–15)
BUN: 47 mg/dL — AB (ref 6–20)
CALCIUM: 9.2 mg/dL (ref 8.9–10.3)
CO2: 25 mmol/L (ref 22–32)
CREATININE: 1.81 mg/dL — AB (ref 0.61–1.24)
Chloride: 103 mmol/L (ref 101–111)
GFR calc Af Amer: 38 mL/min — ABNORMAL LOW (ref >60)
GFR, EST NON AFRICAN AMERICAN: 33 mL/min — AB (ref >60)
GLUCOSE: 150 mg/dL — AB (ref 65–99)
Potassium: 3.4 mmol/L — ABNORMAL LOW (ref 3.5–5.1)
Sodium: 140 mmol/L (ref 135–145)

## 2016-01-07 LAB — BRAIN NATRIURETIC PEPTIDE: B Natriuretic Peptide: 1395.2 pg/mL — ABNORMAL HIGH (ref 0.0–100.0)

## 2016-01-07 MED ORDER — METOPROLOL SUCCINATE ER 200 MG PO TB24: 100.0000 mg | ORAL_TABLET | Freq: Every day | ORAL | 6 refills | Status: DC

## 2016-01-07 MED ORDER — POTASSIUM CHLORIDE CRYS ER 20 MEQ PO TBCR: 20.0000 meq | EXTENDED_RELEASE_TABLET | Freq: Every day | ORAL | Status: DC

## 2016-01-07 MED ORDER — POTASSIUM CHLORIDE CRYS ER 20 MEQ PO TBCR
40.0000 meq | EXTENDED_RELEASE_TABLET | Freq: Two times a day (BID) | ORAL | Status: DC
  Administered 2016-01-07: 40 meq via ORAL
  Filled 2016-01-07: qty 2

## 2016-01-07 MED ORDER — NIACIN ER 500 MG PO CPCR: 500.0000 mg | ORAL_CAPSULE | Freq: Every day | ORAL | Status: DC

## 2016-01-07 MED ORDER — SPIRONOLACTONE 25 MG PO TABS: 25.0000 mg | ORAL_TABLET | Freq: Every day | ORAL | 6 refills | Status: DC

## 2016-01-07 MED ORDER — POTASSIUM CHLORIDE CRYS ER 10 MEQ PO TBCR: 20.0000 meq | EXTENDED_RELEASE_TABLET | Freq: Every day | ORAL | 6 refills | Status: DC

## 2016-01-07 MED ORDER — POTASSIUM CHLORIDE CRYS ER 20 MEQ PO TBCR
40.0000 meq | EXTENDED_RELEASE_TABLET | Freq: Once | ORAL | Status: AC
  Administered 2016-01-07: 40 meq via ORAL
  Filled 2016-01-07: qty 2

## 2016-01-07 MED ORDER — FUROSEMIDE 40 MG PO TABS: ORAL_TABLET | ORAL | 6 refills | Status: DC

## 2016-01-07 NOTE — Discharge Summary (Signed)
Advanced Heart Failure Discharge Note   Discharge Summary   Patient ID: Scott Parsons MRN: [id number], DOB/AGE: 80-Nov-1931 80 y.o. Admit date: 01/03/2016 D/C date:     01/07/2016   Primary Discharge Diagnoses:  1. Acute on chronic systolic HF due to NICM EF 20-25% 2. 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 LBBB s/p CRT-D 4. PAD 5. Hypokalemia  Hospital Course:   Scott Parsons is a 82 y.o. male with a history of NICM, Chronic systolic CHF (Echo 11/2015 EF 20-25%), LBBB s/p biV ICD, CKD III-IV, HTN, PAD, Carotid stenosis, and RAS s/p renal stenting.  He was seen in HF clinic 01/01/16 and thought to be stable. ReDS Vest reading 31, though Optivol showed fluid above threshold. He had sudden onset SOB after returning home, with diaphoresis, but no CP. He went to [facility] where he was admitted and diuresed. The next am, he developed flash pulmonary edema requiring BiPAP and was transferred to [facility].   Creatinine improved on IV lasix.  Dr Ladona Ridgel interrogated his ICD which was functioning normally. He continued to diurese well on IV lasix 80 mg BID.  Optivol interrogated again on 01/07/16 for fluid level. It showed up trend in thoracic impedence, however it also showed CRT pacing only ~85% of the time and that his AV delay was lower than the pre-set level ~80% of the time since the interrogation on 01/04/16. Adjustments made by [employer] rep to increase % of CRT-P.   Overall patient diuresed 5.4 L and down 10 lbs this admission on IV lasix up to 80 mg BID. K supped as needed.  Pt was given a final dose of IV lasix prior to transitioning to increased dose of po lasix for home. Renal function stabilized in 1.7 - 1.8 range.  The patient will be discharged to home in stable condition with close follow up as below.   Discharge Weight Range: 180 lbs Discharge Vitals: Blood pressure (!) 102/51, pulse 67, temperature 97.4 F (36.3 C), temperature source Oral, resp. rate 15, height 6'  2" (1.88 m), weight 180 lb 12.4 oz (82 kg), SpO2 95 %.  Labs: Lab Results  Component Value Date   WBC 6.0 11/14/2015   HGB 13.0 11/14/2015   HCT 39.8 11/14/2015   MCV 100.5 (H) 11/14/2015   PLT 133 (L) 11/14/2015     Recent Labs Lab 01/07/16 0247  NA 140  K 3.4*  CL 103  CO2 25  BUN 47*  CREATININE 1.81*  CALCIUM 9.2  GLUCOSE 150*   No results found for: CHOL, HDL, LDLCALC, TRIG BNP (last 3 results)  Recent Labs  07/11/15 1205 11/12/15 0411 01/07/16 0251  BNP 240.2* 4,293.7* 1,395.2*    ProBNP (last 3 results) No results for input(s): PROBNP in the last 8760 hours.   Diagnostic Studies/Procedures   No results found.  Discharge Medications     Medication List    TAKE these medications   allopurinol 100 MG tablet Commonly known as:  ZYLOPRIM Take 100 mg by mouth daily.   aspirin EC 81 MG tablet Take 81 mg by mouth daily.   atorvastatin 20 MG tablet Commonly known as:  LIPITOR Take 20 mg by mouth daily.   clopidogrel 75 MG tablet Commonly known as:  PLAVIX Take 75 mg by mouth daily.   Co Q 10 100 MG Caps Take 100 mg by mouth daily.   ENTRESTO 24-26 MG Generic drug:  sacubitril-valsartan Take 1 tablet by mouth See admin instructions. Take  1 tablet by mouth every morning and at lunch   FIFTY50 GLUCOSE METER 2.0 w/Device Kit   furosemide 40 MG tablet Commonly known as:  LASIX Take 80 mg (2 tablets) every morning and 40 mg (1 tablet) every evening. What changed:  how much to take  how to take this  when to take this  additional instructions   gabapentin 300 MG capsule Commonly known as:  NEURONTIN Take 300 mg by mouth at bedtime.   isosorbide mononitrate 30 MG 24 hr tablet Commonly known as:  IMDUR Take 30 mg by mouth daily.   Magnesium 250 MG Tabs Take 500 mg by mouth daily.   metoprolol 200 MG 24 hr tablet Commonly known as:  TOPROL-XL Take 0.5 tablets (100 mg total) by mouth daily. What changed:  how much to take     niacin 500 MG tablet Take 500 mg by mouth daily with breakfast.   nitroGLYCERIN 0.4 MG SL tablet Commonly known as:  NITROSTAT Place 0.4 mg under the tongue every 5 (five) minutes as needed for chest pain (maximum 3 tablets).   OXYGEN Inhale 2 L into the lungs at bedtime.   potassium chloride 10 MEQ tablet Commonly known as:  K-DUR,KLOR-CON Take 2 tablets (20 mEq total) by mouth daily. Start taking on:  01/08/2016   predniSONE 5 MG tablet Commonly known as:  DELTASONE Take 5 mg by mouth See admin instructions. Take 1 tablet (5 mg) by mouth daily at bedtime for a couple days as needed for shoulder pain   spironolactone 25 MG tablet Commonly known as:  ALDACTONE Take 1 tablet (25 mg total) by mouth daily.   zolpidem 10 MG tablet Commonly known as:  AMBIEN Take 5 mg by mouth at bedtime.       Disposition   The patient will be discharged in stable condition to home. Discharge Instructions    (HEART FAILURE PATIENTS) Call MD:  Anytime you have any of the following symptoms: 1) 3 pound weight gain in 24 hours or 5 pounds in 1 week 2) shortness of breath, with or without a dry hacking cough 3) swelling in the hands, feet or stomach 4) if you have to sleep on extra pillows at night in order to breathe.    Complete by:  As directed   Diet - low sodium heart healthy    Complete by:  As directed   Heart Failure patients record your daily weight using the same scale at the same time of day    Complete by:  As directed   Increase activity slowly    Complete by:  As directed     Follow-up Information    [facility] Follow up on 01/15/2016.   Specialty:  Cardiology Why:  at 200 pm for post hospital follow up. Please bring all of your medications to your visit. The code for parking is 0030. Contact information: [address] [id number] [place] [phone]            Duration of Discharge Encounter:  Greater than 35 minutes   Signed, Annamaria Helling 01/07/2016, 8:23 PM   Patient seen and examined with Oda Kilts, PA-C. We discussed all aspects of the encounter. I agree with the assessment and plan as stated above.   Much improved with diuresis. Down 11 pounds. Renal function stable. Can d/c home today with close f/u in HF Clinic. Reinforced need for daily weights and reviewed use  of sliding scale diuretics.  Bobbi Yount,MD 12:47 AM

## 2016-01-07 NOTE — Progress Notes (Signed)
Order for discharge acknowledged. Discharge instructions explained and given to pt., pt verbalized understanding. Pt's belongings packed, pt transported via w/c with family in tow.  CCMD and Elink both notified.

## 2016-01-07 NOTE — Progress Notes (Signed)
Advanced Heart Failure Rounding Note    Subjective:    Admitted with flash pulmonary edema requiring BiPAP and IV lasix.   Normal ICD function per Dr. Ladona Ridgel 01/04/16. On check today, 01/07/16 his intrinsic AV delay has been lower than the ICD setting ~ 80% of the time.  Only CRT pacing ~ 85%.   Rep to adjust settings after conferring with EP.   Feels OK this am. Complaining of flushing, was just given niacin. Denies DOE or CP. No lightheadedness or dizziness.   Out 1.3 L and down 1 lb. Creatinine 2.1 -> 1.8 -> 1.7 -> 1.84 -> 1.81. K 3.4   R/L heart cath 7/17 Conclusion   Mid RCA lesion, 30% stenosed.  RPDA lesion, 30% stenosed.  Ost LM lesion, 20% stenosed.  Mid LAD lesion, 40% stenosed.  Findings: RA = 3 RV = 36/3 PA = 34/12 (20) PCW = 12 Ao = 118/48 (72) LV = 129/18 Fick cardiac output/index = 5.6/2.6 PVR = 1.4 WU SVR = 984 FA sat = 87% PA sat = 58%, 55%    Objective:   Weight Range:  Vital Signs:   Temp:  [97.2 F (36.2 C)-98.5 F (36.9 C)] 97.2 F (36.2 C) (08/30 0747) Pulse Rate:  [68-79] 68 (08/30 0747) Resp:  [11-20] 11 (08/30 0747) BP: (96-122)/(39-59) 113/50 (08/30 0747) SpO2:  [90 %-100 %] 98 % (08/30 0747) Weight:  [180 lb 12.4 oz (82 kg)] 180 lb 12.4 oz (82 kg) (08/30 0417) Last BM Date: 01/03/16  Weight change: Filed Weights   01/05/16 0356 01/06/16 0301 01/07/16 0417  Weight: 185 lb 10 oz (84.2 kg) 181 lb 14.4 oz (82.5 kg) 180 lb 12.4 oz (82 kg)    Intake/Output:   Intake/Output Summary (Last 24 hours) at 01/07/16 0837 Last data filed at 01/07/16 0300  Gross per 24 hour  Intake             1200 ml  Output             2600 ml  Net            -1400 ml     Physical Exam: General:  Elderly sitting in bed NAD No resp difficulty HEENT: normal Neck: supple. JVP 7-8 cm. Carotids 2+ bilat; no bruits. No thyromegaly or nodule noted.  Cor: PMI laterally displaced. RRR. Lungs: Mildly diminished basilar sounds.  Abdomen: soft, NT, ND, no  HSM. No bruits or masses. +BS  Extremities: no cyanosis, clubbing, rash, trace ankle edema Neuro: alert & orientedx3, cranial nerves grossly intact. moves all 4 extremities w/o difficulty. Affect pleasant  Telemetry: Reviewed, NSR with BiV pacing, occasional PVCs  Labs: Basic Metabolic Panel:  Recent Labs Lab 01/01/16 1101 01/03/16 1848 01/04/16 0517 01/05/16 0421 01/06/16 0248 01/07/16 0247  NA 139  --  142 143 143 140  K 3.9  --  3.2* 3.5 3.3* 3.4*  CL 106  --  106 106 105 103  CO2 24  --  27 29 29 25   GLUCOSE 134*  --  122* 141* 154* 150*  BUN 39*  --  39* 36* 38* 47*  CREATININE 2.13* 2.11* 1.81* 1.78* 1.84* 1.81*  CALCIUM 9.0  --  8.9 9.0 9.0 9.2  MG  --   --  2.0  --   --   --     Liver Function Tests: No results for input(s): AST, ALT, ALKPHOS, BILITOT, PROT, ALBUMIN in the last 168 hours. No results for input(s): LIPASE, AMYLASE in the  last 168 hours. No results for input(s): AMMONIA in the last 168 hours.  CBC: No results for input(s): WBC, NEUTROABS, HGB, HCT, MCV, PLT in the last 168 hours.  Cardiac Enzymes: No results for input(s): CKTOTAL, CKMB, CKMBINDEX, TROPONINI in the last 168 hours.  BNP: BNP (last 3 results)  Recent Labs  07/11/15 1205 11/12/15 0411 01/07/16 0251  BNP 240.2* 4,293.7* 1,395.2*    ProBNP (last 3 results) No results for input(s): PROBNP in the last 8760 hours.    Other results:  Imaging: No results found.   Medications:     Scheduled Medications: . allopurinol  100 mg Oral Daily  . aspirin EC  81 mg Oral Daily  . atorvastatin  20 mg Oral Daily  . clopidogrel  75 mg Oral Daily  . enoxaparin (LOVENOX) injection  30 mg Subcutaneous Q24H  . furosemide  80 mg Intravenous BID  . gabapentin  300 mg Oral QHS  . isosorbide mononitrate  30 mg Oral Daily  . magnesium oxide  400 mg Oral Daily  . mouth rinse  15 mL Mouth Rinse BID  . metoprolol  100 mg Oral Daily  . niacin  500 mg Oral Q breakfast  . potassium chloride   40 mEq Oral BID  . sacubitril-valsartan  1 tablet Oral BID  . sodium chloride flush  3 mL Intravenous Q12H  . spironolactone  25 mg Oral Daily  . zolpidem  5 mg Oral QHS    Infusions:    PRN Medications: sodium chloride, acetaminophen, ALPRAZolam, nitroGLYCERIN, ondansetron (ZOFRAN) IV, predniSONE, sodium chloride flush, zolpidem   Assessment:   1. Acute on chronic systolic HF due to NICM EF 20-25% 2. 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 LBBB s/p CRT-D 4. PAD 5. Hypokalemia   Plan/Discussion:    Volume status improving on exam and by optivol.  Give IV lasix this am and transition to po tonight.   Will give lasix 80/40 mg for home.  If continues to struggle with volume will consider switch to Torsemide.   Supp K. Continue spiro 25 mg daily.    Continue Toprol 100 mg daily. Continue Entresto.   Continue cardiac rehab as outpatient.  Home this afternoon vs tomorrow am.   Length of Stay: 4  Scott SchoolMichael Andrew Tillery, Scott Parsons 01/07/2016, 8:37 AM  Advanced Heart Failure Team Pager [phone] (M-F; 7a - 4p)  Please contact [facility] for night-coverage after hours (4p -7a ) and weekends on [email or web address]  Patient seen and examined with Otilio SaberAndy Tillery, Scott Parsons. We discussed all aspects of the encounter. I agree with the assessment and plan as stated above.   He is much improved. Weight down 11 pounds. Optivol now coming down.Renal function stable.  We will send home today with lasix 80/40. Follow up next week in HF Clinic. Reinforced need for daily weights and reviewed use of sliding scale diuretics.  Ava Deguire,MD 8:21 PM

## 2016-01-07 NOTE — Progress Notes (Signed)
Physical Therapy Treatment Patient Details Name: ONEIL MELMAN MRN: [id number] DOB: [birth year] Today's Date: 01/07/2016    History of Present Illness Pt admitted for Acute on chronic combined systolic and diastolic CHF (congestive heart failure) (HCC) and pulmonary edema.      PT Comments    The patient is feeling stronger. Oxygen saturation > 94%  For ambulation. Patient reports using O2 at night. Continue PT while in acute care.  Follow Up Recommendations  No PT follow up;Supervision/Assistance - 24 hour     Equipment Recommendations   (OP rehab when MD allows)    Recommendations for Other Services       Precautions / Restrictions Precautions Precautions: ICD/Pacemaker Precaution Comments: monitor sats off of O2 Restrictions Weight Bearing Restrictions: No    Mobility  Bed Mobility Overal bed mobility: Independent                Transfers   Equipment used: None   Sit to Stand: Supervision            Ambulation/Gait Ambulation/Gait assistance: Min guard;Supervision Ambulation Distance (Feet): 376 Feet Assistive device: None Gait Pattern/deviations: Step-through pattern     General Gait Details: initially with light handhold x 100', then nione, gait is slow and guarded, decreased head movement, tends to have head flexed with decreased AROM. HR=,82, oxygen saturation >94 % on RA. declines need for RW at thos time.   Stairs            Wheelchair Mobility    Modified Rankin (Stroke Patients Only)       Balance           Standing balance support: No upper extremity supported;During functional activity Standing balance-Leahy Scale: Fair Standing balance comment: turns around and backs up without balance loss.                    Cognition Arousal/Alertness: Awake/alert                          Exercises      General Comments        Pertinent Vitals/Pain Pain Assessment: No/denies pain    Home Living                      Prior Function            PT Goals (current goals can now be found in the care plan section) Progress towards PT goals: Progressing toward goals    Frequency  Min 3X/week    PT Plan Current plan remains appropriate    Co-evaluation             End of Session Equipment Utilized During Treatment: Gait belt Activity Tolerance: Patient tolerated treatment well Patient left: with call bell/phone within reach;in chair;with nursing/sitter in room     Time: 0928-0952 PT Time Calculation (min) (ACUTE ONLY): 24 min  Charges:  $Gait Training: 23-37 mins                    G Codes:      Sharen Heck PT [phone]  01/07/2016, 10:00 AM

## 2016-01-15 ENCOUNTER — Ambulatory Visit (HOSPITAL_COMMUNITY)
Admission: RE | Admit: 2016-01-15 | Discharge: 2016-01-15 | Disposition: A | Discharge status: Home / Self Care | Payer: Medicare Other | Source: Ambulatory Visit | Attending: Cardiology | Admitting: Cardiology

## 2016-01-15 VITALS — BP 82/46 | HR 84 | Wt 185.0 lb

## 2016-01-15 DIAGNOSIS — Z955 Presence of coronary angioplasty implant and graft: Secondary | ICD-10-CM | POA: Insufficient documentation

## 2016-01-15 DIAGNOSIS — I429 Cardiomyopathy, unspecified: Secondary | ICD-10-CM | POA: Insufficient documentation

## 2016-01-15 DIAGNOSIS — I5042 Chronic combined systolic (congestive) and diastolic (congestive) heart failure: Secondary | ICD-10-CM

## 2016-01-15 DIAGNOSIS — Z881 Allergy status to other antibiotic agents status: Secondary | ICD-10-CM | POA: Diagnosis not present

## 2016-01-15 DIAGNOSIS — Z7982 Long term (current) use of aspirin: Secondary | ICD-10-CM | POA: Diagnosis not present

## 2016-01-15 DIAGNOSIS — Z95810 Presence of automatic (implantable) cardiac defibrillator: Secondary | ICD-10-CM

## 2016-01-15 DIAGNOSIS — I701 Atherosclerosis of renal artery: Secondary | ICD-10-CM | POA: Insufficient documentation

## 2016-01-15 DIAGNOSIS — I739 Peripheral vascular disease, unspecified: Secondary | ICD-10-CM | POA: Insufficient documentation

## 2016-01-15 DIAGNOSIS — R7303 Prediabetes: Secondary | ICD-10-CM | POA: Insufficient documentation

## 2016-01-15 DIAGNOSIS — Z8673 Personal history of transient ischemic attack (TIA), and cerebral infarction without residual deficits: Secondary | ICD-10-CM | POA: Diagnosis not present

## 2016-01-15 DIAGNOSIS — I130 Hypertensive heart and chronic kidney disease with heart failure and stage 1 through stage 4 chronic kidney disease, or unspecified chronic kidney disease: Secondary | ICD-10-CM | POA: Diagnosis not present

## 2016-01-15 DIAGNOSIS — N184 Chronic kidney disease, stage 4 (severe): Secondary | ICD-10-CM

## 2016-01-15 DIAGNOSIS — I10 Essential (primary) hypertension: Secondary | ICD-10-CM | POA: Diagnosis not present

## 2016-01-15 DIAGNOSIS — Z8249 Family history of ischemic heart disease and other diseases of the circulatory system: Secondary | ICD-10-CM | POA: Diagnosis not present

## 2016-01-15 DIAGNOSIS — Z87891 Personal history of nicotine dependence: Secondary | ICD-10-CM | POA: Insufficient documentation

## 2016-01-15 DIAGNOSIS — I447 Left bundle-branch block, unspecified: Secondary | ICD-10-CM | POA: Diagnosis not present

## 2016-01-15 DIAGNOSIS — E785 Hyperlipidemia, unspecified: Secondary | ICD-10-CM | POA: Insufficient documentation

## 2016-01-15 DIAGNOSIS — I5043 Acute on chronic combined systolic (congestive) and diastolic (congestive) heart failure: Secondary | ICD-10-CM

## 2016-01-15 DIAGNOSIS — I2510 Atherosclerotic heart disease of native coronary artery without angina pectoris: Secondary | ICD-10-CM | POA: Diagnosis not present

## 2016-01-15 DIAGNOSIS — I6523 Occlusion and stenosis of bilateral carotid arteries: Secondary | ICD-10-CM | POA: Insufficient documentation

## 2016-01-15 DIAGNOSIS — I472 Ventricular tachycardia: Secondary | ICD-10-CM | POA: Diagnosis not present

## 2016-01-15 DIAGNOSIS — I951 Orthostatic hypotension: Secondary | ICD-10-CM

## 2016-01-15 DIAGNOSIS — Z882 Allergy status to sulfonamides status: Secondary | ICD-10-CM | POA: Insufficient documentation

## 2016-01-15 DIAGNOSIS — Z91013 Allergy to seafood: Secondary | ICD-10-CM | POA: Insufficient documentation

## 2016-01-15 DIAGNOSIS — G4733 Obstructive sleep apnea (adult) (pediatric): Secondary | ICD-10-CM

## 2016-01-15 DIAGNOSIS — I779 Disorder of arteries and arterioles, unspecified: Secondary | ICD-10-CM

## 2016-01-15 DIAGNOSIS — Z880 Allergy status to penicillin: Secondary | ICD-10-CM | POA: Insufficient documentation

## 2016-01-15 DIAGNOSIS — I5022 Chronic systolic (congestive) heart failure: Secondary | ICD-10-CM | POA: Diagnosis not present

## 2016-01-15 LAB — BASIC METABOLIC PANEL
Anion gap: 11 (ref 5–15)
BUN: 71 mg/dL — AB (ref 6–20)
CO2: 21 mmol/L — ABNORMAL LOW (ref 22–32)
CREATININE: 2.69 mg/dL — AB (ref 0.61–1.24)
Calcium: 9.3 mg/dL (ref 8.9–10.3)
Chloride: 103 mmol/L (ref 101–111)
GFR calc Af Amer: 24 mL/min — ABNORMAL LOW (ref >60)
GFR, EST NON AFRICAN AMERICAN: 20 mL/min — AB (ref >60)
GLUCOSE: 156 mg/dL — AB (ref 65–99)
POTASSIUM: 5.6 mmol/L — AB (ref 3.5–5.1)
Sodium: 135 mmol/L (ref 135–145)

## 2016-01-15 MED ORDER — FUROSEMIDE 40 MG PO TABS: 80.0000 mg | ORAL_TABLET | Freq: Every day | ORAL | 6 refills | Status: DC

## 2016-01-15 NOTE — Patient Instructions (Signed)
HOLD LASIX AND POTASSIUM FOR 2 DAYS,  On Sunday restart Lasix at 80 mg, daily and potassium at 20 meq daily  Labs today  Your physician recommends that you schedule a follow-up appointment in: 1 week

## 2016-01-15 NOTE — Progress Notes (Signed)
Patient ID: Scott Parsons, male   DOB: 03-31-1930, 82 y.o.   MRN: [id number]    Advanced Heart Failure Clinic Note    Primary Care: [name], MD Primary Cardiologist: Dr Gwenlyn Found Primary HF: Dr. [name]   HPI:  Scott Parsons is a 82 y.o. male with history of NICM, Chronic systolic CHF (Echo 0/0174 EF 20-25%), LBBB s/p biV ICD, CKD III-IV, HTN, PAD, Carotid stenosis, and RAS s/p renal stenting.   He is followed closely in the [facility] CHF clinic by Carolynn Serve. In 09/2015 was found to have worsening of renal fxn along with hyperkalemia. He was admitted to [facility] and his lisinopril, valsartan, and spironolactone were discontinued. Labs were stable upon f/u and he was subsequently placed on entresto.  Was taken to [place] ED on 11/11/15 with acute worsening dyspnea. Required Bipap. Tx to [facility] for further eval. Was admitted by CCM for resp failure. Had significant symptom improvement with diuresis. Catheterization as below with mild non-obstructive CAD and well compensated hemodynamics after diuresis. Diuresed 2.2 L. Weight 190 on discharge.   Admitted 8/26-8/30 with flash edema. Diuresed with IV lasix. ICD adjusted to increase pacing. Out 10 lbs with IV lasix. Discharge weight was 180 lbs.   He presents today for post hospital follow up.  Weight up 5 lbs from discharge.  By home scale, however, he is down to 178.2 (weight trending down since 01/09/16).  Has decreased appetite. Breathing has been stable.  No longer on prednisone for back pain. Still gets SOB with steps or inclines.  He has been more lightheaded over the past few days, but usually this passes after a few seconds. Following daily weights and taking all medications as directed.   Optivol interrogation: Steep drop of optivol~01/09/16. Thoracic impedence above threshold. No AT/AF. Pt activity improving to ~ 1 hr daily.   R/LHC 11/13/15  Mild non-obstructive CAD, and well compensated hemodynamics after diuresis.  RA = 3 RV = 36/3 PA =  34/12 (20) PCW = 12 Ao = 118/48 (72) LV = 129/18 Fick cardiac output/index = 5.6/2.6 PVR = 1.4 WU SVR = 984 FA sat = 87% PA sat = 58%, 55%  Echo 11/13/15  LVEF 20-25% with diffuse hypokinesis. Mild AS, Moderate MR, Severe LAE   Past Medical History:  Diagnosis Date  . Carotid artery disease ([place])    a. 04/2015 mild to mod bilat ICA stenosis;  b. 01/4495 >75% LICA stenosis, no significant dzs on right.  . Chronic combined systolic and diastolic CHF, NYHA class 4 ([place])    a. 01/2013 Echo: EF 25-30%; b. 08/2013 Echo: EF 15-20%; c. 04/2014 Echo: EF 20-25%, mild LVH, Gr1 DD, mild MR, mod dil LA.  . CKD (chronic kidney disease), stage IV ([place])    a. baseline creat of ~ 1.8;  b. 09/2015 Admitted to [facility] with AKI and hyperkalemia.  [name] History of stroke   . Hyperlipidemia   . Hypertensive heart disease with CHF (congestive heart failure) ([place])   . ICD (implantable cardioverter-defibrillator), dual, in situ    a. 09/2013 s/p MDT Hillery Aldo XT CRT-D (ser #[id number]).  . LBBB (left bundle branch block)   . Non-obstructive CAD    a. 11/2001 Cath [facility]): LM nl, LAD 20m LCX nondom, nl, RI large, nl, RCA dominant, 460m.  . Mar[place]onischemic cardiomyopathy ([place]   a. 11/2001 nonobs cath; b. 09/2013 s/p MDT ViHillery AldoT CRT-D (ser #B#[id number]); c. 04/2014 Echo: EF 20-25%;  d. Followed @ [facility] by K.  Andree Elk, MD.  . OSA (obstructive sleep apnea)    a. Does not tolerate/use CPAP.  . Pre-diabetes   . PVD (peripheral vascular disease) ([place])    a. 10/2005 PTA: right external iliac artery angioplasty and left renal artery angioplasty and stenting;  b. 10/2015 ABI's in setting of recurrent claudication: R 0.97, L 0.77 (reduced by 0.12 from prior study).  . Renal artery stenosis ([place])    a. 10/2005 s/p L renal artery stenting.  [name] TIA (transient ischemic attack) 06/2010    Current Outpatient Prescriptions  Medication Sig Dispense Refill  . allopurinol (ZYLOPRIM) 100 MG tablet Take 100 mg by mouth daily.    [name]  aspirin EC 81 MG tablet Take 81 mg by mouth daily.    [name] atorvastatin (LIPITOR) 20 MG tablet Take 20 mg by mouth daily.    . Blood Glucose Monitoring Suppl (FIFTY50 GLUCOSE METER 2.0) w/Device KIT     . clopidogrel (PLAVIX) 75 MG tablet Take 75 mg by mouth daily.    . Coenzyme Q10 (CO Q 10) 100 MG CAPS Take 100 mg by mouth daily.    . furosemide (LASIX) 40 MG tablet Take 80 mg (2 tablets) every morning and 40 mg (1 tablet) every evening. 100 tablet 6  . gabapentin (NEURONTIN) 300 MG capsule Take 300 mg by mouth at bedtime.    . isosorbide mononitrate (IMDUR) 30 MG 24 hr tablet Take 30 mg by mouth daily.    . Magnesium 250 MG TABS Take 500 mg by mouth daily.     . metoprolol (TOPROL-XL) 200 MG 24 hr tablet Take 0.5 tablets (100 mg total) by mouth daily. 15 tablet 6  . niacin 500 MG tablet Take 500 mg by mouth daily with breakfast.    . nitroGLYCERIN (NITROSTAT) 0.4 MG SL tablet Place 0.4 mg under the tongue every 5 (five) minutes as needed for chest pain (maximum 3 tablets).    . OXYGEN Inhale 2 L into the lungs at bedtime.    . potassium chloride SA (K-DUR,KLOR-CON) 10 MEQ tablet Take 2 tablets (20 mEq total) by mouth daily. 70 tablet 6  . predniSONE (DELTASONE) 5 MG tablet Take 5 mg by mouth See admin instructions. Take 1 tablet (5 mg) by mouth daily at bedtime for a couple days as needed for shoulder pain    . sacubitril-valsartan (ENTRESTO) 24-26 MG Take 1 tablet by mouth See admin instructions. Take 1 tablet by mouth every morning and at lunch    . spironolactone (ALDACTONE) 25 MG tablet Take 1 tablet (25 mg total) by mouth daily. 30 tablet 6  . zolpidem (AMBIEN) 10 MG tablet Take 5 mg by mouth at bedtime.      No current facility-administered medications for this encounter.     Allergies  Allergen Reactions  . Amoxicillin Itching    Itching    . Biaxin [Clarithromycin] Itching    Lip swelling     . Erythromycin Itching  . Penicillins Hives and Itching    Has patient had a PCN  reaction causing immediate rash, facial/tongue/throat swelling, SOB or lightheadedness with hypotension: Yes Has patient had a PCN reaction causing severe rash involving mucus membranes or skin necrosis: No Has patient had a PCN reaction that required hospitalization No Has patient had a PCN reaction occurring within the last 10 years: No If all of the above answers are "NO", then may proceed with Cephalosporin use.  . Shellfish Allergy Itching        . Sulfa  Antibiotics Other (See Comments)    AKI and hyperkalemia      Social History   Social History  . Marital status: Married    Spouse name: N/A  . Number of children: N/A  . Years of education: N/A   Occupational History  . Retired    Social History Main Topics  . Smoking status: Former Smoker    Years: 52.00    Types: Cigarettes    Quit date: 08/11/2004  . Smokeless tobacco: Never Used  . Alcohol use No  . Drug use: No  . Sexual activity: Yes   Other Topics Concern  . Not on file   Social History Narrative   Retired.  Lives in [place] with his wife.  Tries to remain active.      Family History  Problem Relation Age of Onset  . Alzheimer's disease Mother   . Heart disease Father   . Heart disease Paternal Grandfather   . Heart attack Paternal Grandfather     Vitals:   01/15/16 1432  BP: (!) 82/46  Pulse: 84  SpO2: 96%  Weight: 185 lb (83.9 kg)   Orthostatics Sitting 82/50 Standing - Barely audible - ?SBP ~60   Wt Readings from Last 3 Encounters:  01/15/16 185 lb (83.9 kg)  01/07/16 180 lb 12.4 oz (82 kg)  01/01/16 198 lb 6.4 oz (90 kg)     PHYSICAL EXAM: General: Elderly. No resp difficulty. Fatigued appearing.  HEENT: normal  Neck: supple. JVP flat. Carotids 2+ bilat; no bruits. No thyromegaly or nodule noted.  Cor: PMI nondisplaced. RRR with occasional ectopy, 2/6 MR, TR Lungs: CTAB, normal effort Abdomen: soft, NT, ND, no HSM. No bruits or masses. +BS  Extremities: no cyanosis,  clubbing, rash, edema Neuro: alert & orientedx3, cranial nerves grossly intact. moves all 4 extremities w/o difficulty. Affect pleasant  ASSESSMENT & PLAN:  1. Chronic systolic HF,  Echo 08/11/01 EF 20-25% - NICM --s/p CRT-D - AV delay recently adjusted.  Went from 75.9% V pacing to 92.2%.  - Volume status dry. ReDS Vest reading 22.   - Hold lasix 2 days, then decrease to 80 mg daily. (Resume  01/18/16) OK to liberalize fluid intake today.  - Did not give IV fluid per patient request and with relative lack of symptoms.  - Continue potassium 20 meq daily. Hold until Sunday. BMET today.  - Continue toprol-xl 100 mg daily - Continue Entresto 24/26 mg BID. - Continue Imdur 30 mg daily.  - Reinforced importance of daily weights and to call with weight gain of 3 lbs overnight or 5 lbs in one week.  2. CKD III-IV - BMET today.  3. NSVT - Still having frequent PVCs on EKG 4. Carotid stenosis > 70% on L  5. CAD - Mild non obstructive on cath 11/2015 - Continue ASA 81 mg daily, Clopidigrel 75 mg daily 6. Orthostasis - Orthostatic in clinic today. Dry by ReDS vest and optivol. Given option of fluid liberalization or IV fluid, pt wished to return home, states he really isn't feeling all that bad.   Holding lasix with BMET today as above with Orthostasis.  Follow up next week.   Legrand Como [address] [place], PA-C 01/15/2016 2:58 PM   Total time spent > 25 minutes. Over half that spent discussing the above.

## 2016-01-15 NOTE — Progress Notes (Signed)
.  REDS VEST READING= 22 CHEST RULER=12  VEST FITTING TASKS: POSTURE=stand HEIGHT MARKER=tall CENTER STRIP=aligned  COMMENTS:n/a   Chantel Constance Haw, CMA   Agree. Consistent with Dry volume status.    Casimiro Needle [address] [place], PA-C 01/15/2016 8:24 PM

## 2016-01-19 ENCOUNTER — Encounter: Payer: Self-pay | Admitting: Internal Medicine

## 2016-01-21 NOTE — Progress Notes (Signed)
Electrophysiology Office Note Date: 01/22/2016  ID:  Scott Parsons, DOB 19-Jul-1929, MRN [id number]  PCP: [name], MD Primary Cardiologist: [place] Electrophysiologist: [place]  CC: Routine ICD follow-up  Scott Parsons is a 82 y.o. male seen today for Dr [name].  He presents today for EP follow up following AV optimization. He was recently hospitalized with heart failure and diuresed.  He was seen in the AHF clinic last week and felt to be dry. His Lasix was held for a few days and then he resumed on Friday of last week.  Since last being seen in our clinic, the patient reports doing reasonably well.  He has persistent fatigue and exercise intolerance.  He denies chest pain, palpitations, dyspnea, PND, orthopnea, nausea, vomiting, dizziness, syncope, edema, weight gain, or early satiety.  He has not had ICD shocks.   Device History: MDT CRTD implanted 2015 for CHF History of appropriate therapy: No History of AAD therapy: No   Past Medical History:  Diagnosis Date  . Carotid artery disease ([place])    a. 04/2015 mild to mod bilat ICA stenosis;  b. 06/8313 >17% LICA stenosis, no significant dzs on right.  . Chronic combined systolic and diastolic CHF, NYHA class 4 ([place])    a. 01/2013 Echo: EF 25-30%; b. 08/2013 Echo: EF 15-20%; c. 04/2014 Echo: EF 20-25%, mild LVH, Gr1 DD, mild MR, mod dil LA.  . CKD (chronic kidney disease), stage IV ([place])    a. baseline creat of ~ 1.8;  b. 09/2015 Admitted to [facility] with AKI and hyperkalemia.  [name] History of stroke   . Hyperlipidemia   . Hypertensive heart disease with CHF (congestive heart failure) ([place])   . ICD (implantable cardioverter-defibrillator), dual, in situ    a. 09/2013 s/p MDT Hillery Aldo XT CRT-D (ser #[id number]).  . LBBB (left bundle branch block)   . Non-obstructive CAD    a. 11/2001 Cath [facility]): LM nl, LAD 20m LCX nondom, nl, RI large, nl, RCA dominant, 422m.  . Marland Kitchenonischemic cardiomyopathy ([facility]   a. 11/2001 nonobs cath;  b. 09/2013 s/p MDT ViHillery AldoT CRT-D (ser #B#[id number]); c. 04/2014 Echo: EF 20-25%;  d. Followed @ [facility] by K. AdAndree ElkMD.  . OSA (obstructive sleep apnea)    a. Does not tolerate/use CPAP.  . Pre-diabetes   . PVD (peripheral vascular disease) ([place]   a. 10/2005 PTA: right external iliac artery angioplasty and left renal artery angioplasty and stenting;  b. 10/2015 ABI's in setting of recurrent claudication: R 0.97, L 0.77 (reduced by 0.12 from prior study).  . Renal artery stenosis ([facility]   a. 10/2005 s/p L renal artery stenting.  . Marland KitchenIA (transient ischemic attack) 06/2010   Past Surgical History:  Procedure Laterality Date  . BI-VENTRICULAR IMPLANTABLE CARDIOVERTER DEFIBRILLATOR N/A 09/13/2013   Procedure: BI-VENTRICULAR IMPLANTABLE CARDIOVERTER DEFIBRILLATOR  (CRT-D);  Surgeon: JaCoralyn MarkMD;  [facility];  Service: Cardiovascular;  Laterality: N/A;  . BI-VENTRICULAR IMPLANTABLE CARDIOVERTER DEFIBRILLATOR  (CRT-D)  09-13-2013   MDT CRTD implanted by Dr AlRayann Heman. CARDIAC CATHETERIZATION  2003   Mild LV dysfuntion. he had apical and inferoapical wall motion abnormalities and EF of 45%.  . Marland KitchenARDIAC CATHETERIZATION  10/18/2005   STENTS: left renal artery as well as both iliac arteries. Was stented with 1230m 4mm37mart Nitinol self-expanding stent replaced with a 9mm 15mmm b40moon at nominal pressures, resulting in a reduction od 50% proximal right external iliac artery  stenosis to 0% residual.  . CARDIAC CATHETERIZATION N/A 11/13/2015   Procedure: Right/Left Heart Cath and Coronary Angiography;  Surgeon: Dolores Patty, MD;  [facility] INVASIVE CV LAB;  Service: Cardiovascular;  Laterality: N/A;  . HAND SURGERY  01/1995  . HERNIA REPAIR      Current Outpatient Prescriptions  Medication Sig Dispense Refill  . allopurinol (ZYLOPRIM) 100 MG tablet Take 100 mg by mouth daily.    [name] aspirin EC 81 MG tablet Take 81 mg by mouth daily.    [name] atorvastatin (LIPITOR) 20 MG tablet Take 20 mg  by mouth daily.    . Blood Glucose Monitoring Suppl (FIFTY50 GLUCOSE METER 2.0) w/Device KIT     . clopidogrel (PLAVIX) 75 MG tablet Take 75 mg by mouth daily.    . Coenzyme Q10 (CO Q 10) 100 MG CAPS Take 100 mg by mouth daily.    [name] ENTRESTO 24-26 MG Take 1 tablet by mouth 2 (two) times daily.  3  . furosemide (LASIX) 40 MG tablet Take 2 tablets (80 mg total) by mouth daily. 60 tablet 6  . gabapentin (NEURONTIN) 300 MG capsule Take 300 mg by mouth at bedtime.    . isosorbide mononitrate (IMDUR) 30 MG 24 hr tablet Take 30 mg by mouth daily.    . Magnesium 250 MG TABS Take 500 mg by mouth daily.     . metoprolol (TOPROL-XL) 200 MG 24 hr tablet Take 0.5 tablets (100 mg total) by mouth daily. 15 tablet 6  . niacin 500 MG tablet Take 500 mg by mouth daily with breakfast.    . nitroGLYCERIN (NITROSTAT) 0.4 MG SL tablet Place 0.4 mg under the tongue every 5 (five) minutes as needed for chest pain (maximum 3 tablets).    . OXYGEN Inhale 2 L into the lungs at bedtime.    [name] spironolactone (ALDACTONE) 25 MG tablet Take 1 tablet (25 mg total) by mouth daily. 30 tablet 6  . zolpidem (AMBIEN) 10 MG tablet Take 5 mg by mouth at bedtime.     . potassium chloride SA (K-DUR,KLOR-CON) 10 MEQ tablet Take 2 tablets (20 mEq total) by mouth daily. (Patient not taking: Reported on 01/22/2016) 70 tablet 6   No current facility-administered medications for this visit.     Allergies:   Amoxicillin; Biaxin [clarithromycin]; Erythromycin; Penicillins; Shellfish allergy; and Sulfa antibiotics   Social History: Social History   Social History  . Marital status: Married    Spouse name: N/A  . Number of children: N/A  . Years of education: N/A   Occupational History  . Retired    Social History Main Topics  . Smoking status: Former Smoker    Years: 52.00    Types: Cigarettes    Quit date: 08/11/2004  . Smokeless tobacco: Never Used  . Alcohol use No  . Drug use: No  . Sexual activity: Yes   Other Topics  Concern  . Not on file   Social History Narrative   Retired.  Lives in [place] with his wife.  Tries to remain active.    Family History: Family History  Problem Relation Age of Onset  . Alzheimer's disease Mother   . Heart disease Father   . Heart disease Paternal Grandfather   . Heart attack Paternal Grandfather     Review of Systems: All other systems reviewed and are otherwise negative except as noted above.   Physical Exam: VS:  BP (!) 82/56   Pulse 73   Ht  $'6\' 2"'C$  (1.88 m)   Wt 185 lb (83.9 kg)   BMI 23.75 kg/m  , BMI Body mass index is 23.75 kg/m.  GEN- The patient is elderly appearing, alert and oriented x 3 today.   HEENT: normocephalic, atraumatic; sclera clear, conjunctiva pink; hearing intact; oropharynx clear; neck supple  Lungs- Clear to ausculation bilaterally, normal work of breathing.  No wheezes, rales, rhonchi Heart- Regular rate and rhythm (paced)  GI- soft, non-tender, non-distended, bowel sounds present  Extremities- no clubbing, cyanosis, or edema; DP/PT/radial pulses 2+ bilaterally MS- no significant deformity or atrophy Skin- warm and dry, no rash or lesion; ICD pocket well healed Psych- euthymic mood, full affect Neuro- strength and sensation are intact  ICD interrogation- reviewed in detail today,  See PACEART report  EKG:  EKG is not ordered today.  Recent Labs: 11/14/2015: ALT 38; Hemoglobin 13.0; Platelets 133 01/04/2016: Magnesium 2.0; TSH 1.912 01/07/2016: B Natriuretic Peptide 1,395.2 01/15/2016: BUN 71; Creatinine, Ser 2.69; Potassium 5.6; Sodium 135   Wt Readings from Last 3 Encounters:  01/22/16 185 lb (83.9 kg)  01/15/16 185 lb (83.9 kg)  01/07/16 180 lb 12.4 oz (82 kg)     Other studies Reviewed: Additional studies/ records that were reviewed today include: AHF and Dr Jackalyn Lombard office notes  Assessment and Plan:  1.  Chronic systolic dysfunction Still probably somewhat dry on exam today Will defer med changes to AHF team  who he is seeing this afternoon BMET, BNP ordered Normal ICD function See Pace Art report QRS duration and morphology best at simultaneous VV pacing today - device reprogrammed to no LV offset AV delays programmed to PAV of 160mec and SAV of 1022mc per CardioSync recommendations. Could consider AV opt echo in the future  2.  Hypotension Relatively asymptomatic and stable for patient since starting Entresto Follow up with AHF this afternoon as scheduled.     Current medicines are reviewed at length with the patient today.   The patient does not have concerns regarding his medicines.  The following changes were made today:  none  Labs/ tests ordered today include:  Orders Placed This Encounter  Procedures  . Basic metabolic panel  . Brain natriuretic peptide  . Implantable device check     Disposition:   Follow up with Dr AlRayann Heman months, Dr BeHaroldine Lawss scheduled      Signed, AmChanetta MarshallNP 01/22/2016 12:00 PM  [place] [phone]) [phone]

## 2016-01-22 ENCOUNTER — Encounter: Payer: Self-pay | Admitting: Nurse Practitioner

## 2016-01-22 ENCOUNTER — Ambulatory Visit (INDEPENDENT_AMBULATORY_CARE_PROVIDER_SITE_OTHER): Payer: Medicare Other | Admitting: Nurse Practitioner

## 2016-01-22 ENCOUNTER — Encounter: Payer: Self-pay | Admitting: Internal Medicine

## 2016-01-22 ENCOUNTER — Encounter (HOSPITAL_COMMUNITY): Payer: Self-pay

## 2016-01-22 ENCOUNTER — Ambulatory Visit (HOSPITAL_COMMUNITY)
Admission: RE | Admit: 2016-01-22 | Discharge: 2016-01-22 | Disposition: A | Discharge status: Home / Self Care | Payer: Medicare Other | Source: Ambulatory Visit | Attending: Internal Medicine | Admitting: Internal Medicine

## 2016-01-22 ENCOUNTER — Telehealth (HOSPITAL_COMMUNITY): Payer: Self-pay

## 2016-01-22 VITALS — BP 110/42 | HR 77 | Wt 186.0 lb

## 2016-01-22 VITALS — BP 82/56 | HR 73 | Ht 74.0 in | Wt 185.0 lb

## 2016-01-22 DIAGNOSIS — I5022 Chronic systolic (congestive) heart failure: Secondary | ICD-10-CM

## 2016-01-22 DIAGNOSIS — E785 Hyperlipidemia, unspecified: Secondary | ICD-10-CM | POA: Diagnosis not present

## 2016-01-22 DIAGNOSIS — N179 Acute kidney failure, unspecified: Secondary | ICD-10-CM | POA: Diagnosis not present

## 2016-01-22 DIAGNOSIS — Z7982 Long term (current) use of aspirin: Secondary | ICD-10-CM | POA: Insufficient documentation

## 2016-01-22 DIAGNOSIS — I739 Peripheral vascular disease, unspecified: Secondary | ICD-10-CM | POA: Diagnosis not present

## 2016-01-22 DIAGNOSIS — I447 Left bundle-branch block, unspecified: Secondary | ICD-10-CM | POA: Insufficient documentation

## 2016-01-22 DIAGNOSIS — Z95810 Presence of automatic (implantable) cardiac defibrillator: Secondary | ICD-10-CM | POA: Insufficient documentation

## 2016-01-22 DIAGNOSIS — R7303 Prediabetes: Secondary | ICD-10-CM | POA: Insufficient documentation

## 2016-01-22 DIAGNOSIS — I6522 Occlusion and stenosis of left carotid artery: Secondary | ICD-10-CM | POA: Insufficient documentation

## 2016-01-22 DIAGNOSIS — I130 Hypertensive heart and chronic kidney disease with heart failure and stage 1 through stage 4 chronic kidney disease, or unspecified chronic kidney disease: Secondary | ICD-10-CM | POA: Insufficient documentation

## 2016-01-22 DIAGNOSIS — G4733 Obstructive sleep apnea (adult) (pediatric): Secondary | ICD-10-CM | POA: Diagnosis not present

## 2016-01-22 DIAGNOSIS — I429 Cardiomyopathy, unspecified: Secondary | ICD-10-CM | POA: Diagnosis not present

## 2016-01-22 DIAGNOSIS — I951 Orthostatic hypotension: Secondary | ICD-10-CM | POA: Diagnosis not present

## 2016-01-22 DIAGNOSIS — E875 Hyperkalemia: Secondary | ICD-10-CM

## 2016-01-22 DIAGNOSIS — N184 Chronic kidney disease, stage 4 (severe): Secondary | ICD-10-CM | POA: Insufficient documentation

## 2016-01-22 DIAGNOSIS — I2510 Atherosclerotic heart disease of native coronary artery without angina pectoris: Secondary | ICD-10-CM | POA: Diagnosis not present

## 2016-01-22 DIAGNOSIS — Z87891 Personal history of nicotine dependence: Secondary | ICD-10-CM | POA: Insufficient documentation

## 2016-01-22 DIAGNOSIS — Z8673 Personal history of transient ischemic attack (TIA), and cerebral infarction without residual deficits: Secondary | ICD-10-CM | POA: Insufficient documentation

## 2016-01-22 DIAGNOSIS — I472 Ventricular tachycardia: Secondary | ICD-10-CM | POA: Diagnosis not present

## 2016-01-22 LAB — CUP PACEART INCLINIC DEVICE CHECK
Date Time Interrogation Session: 20170914115929
Implantable Lead Location: 753859
Implantable Lead Model: 4598
Implantable Lead Model: 6935
MDC IDC LEAD IMPLANT DT: 20150507
MDC IDC LEAD IMPLANT DT: 20150507
MDC IDC LEAD IMPLANT DT: 20150507
MDC IDC LEAD LOCATION: 753858
MDC IDC LEAD LOCATION: 753860

## 2016-01-22 LAB — BASIC METABOLIC PANEL
ANION GAP: 9 (ref 5–15)
BUN: 76 mg/dL — AB (ref 6–20)
CALCIUM: 9.7 mg/dL (ref 8.9–10.3)
CO2: 25 mmol/L (ref 22–32)
CREATININE: 2.75 mg/dL — AB (ref 0.61–1.24)
Chloride: 99 mmol/L — ABNORMAL LOW (ref 101–111)
GFR calc Af Amer: 23 mL/min — ABNORMAL LOW (ref >60)
GFR, EST NON AFRICAN AMERICAN: 20 mL/min — AB (ref >60)
GLUCOSE: 101 mg/dL — AB (ref 65–99)
Potassium: 5.5 mmol/L — ABNORMAL HIGH (ref 3.5–5.1)
Sodium: 133 mmol/L — ABNORMAL LOW (ref 135–145)

## 2016-01-22 LAB — BRAIN NATRIURETIC PEPTIDE: B Natriuretic Peptide: 277.2 pg/mL — ABNORMAL HIGH (ref 0.0–100.0)

## 2016-01-22 NOTE — Telephone Encounter (Signed)
Called patient to instruct per Amy Clegg NP-C to stop entresto. Patient aware and agreeable.  Ave Filter, RN

## 2016-01-22 NOTE — Patient Instructions (Signed)
STOP Spironolactone.  STOP lasix.  Follow up next week.  Do the following things EVERYDAY: 1) Weigh yourself in the morning before breakfast. Write it down and keep it in a log. 2) Take your medicines as prescribed 3) Eat low salt foods-Limit salt (sodium) to 2000 mg per day.  4) Stay as active as you can everyday 5) Limit all fluids for the day to less than 2 liters

## 2016-01-22 NOTE — Progress Notes (Signed)
Patient ID: Scott Parsons, male   DOB: Mar 27, 1930, 82 y.o.   MRN: [id number]    Advanced Heart Failure Clinic Note    Primary Care: [name], MD Primary Cardiologist: Dr Gwenlyn Found Primary HF: Dr. [name]   HPI: Scott Parsons is a 82 y.o. male with history of NICM, Chronic systolic CHF (Echo 12/1189 EF 20-25%), LBBB s/p biV ICD, CKD III-IV, HTN, PAD, Carotid stenosis, and RAS s/p renal stenting.   He is followed closely in the [facility] CHF clinic by Carolynn Serve. In 09/2015 was found to have worsening of renal fxn along with hyperkalemia. He was admitted to [facility] and his lisinopril, valsartan, and spironolactone were discontinued. Labs were stable upon f/u and he was subsequently placed on entresto.  Was taken to [facility] ED on 11/11/15 with acute worsening dyspnea. Required Bipap. Tx to [facility] for further eval. Was admitted by CCM for resp failure. Had significant symptom improvement with diuresis. Catheterization as below with mild non-obstructive CAD and well compensated hemodynamics after diuresis. Diuresed 2.2 L. Weight 190 on discharge.   Admitted 8/26-8/30 with flash edema. Diuresed with IV lasix. ICD adjusted to increase pacing. Out 10 lbs with IV lasix. Discharge weight was 180 lbs.   He presents today for follow up.  Last week he had orthostatic hypotension. He was instructed to hold lasix and stop potassium. Earlier today he was evaluated by EP with labs obtained. K and creatinine remain elevated. Weight at home only went up about 2 pounds to 180 pounds. Today he complains of dizziness standing and says he is dizzy most of the time. Poor appetite. He has been limiting his fluid. Says he eats lots tomatoes. Taking all medications. Denies SOB/PND/Orthopnea.   R/LHC 11/13/15  Mild non-obstructive CAD, and well compensated hemodynamics after diuresis.  RA = 3 RV = 36/3 PA = 34/12 (20) PCW = 12 Ao = 118/48 (72) LV = 129/18 Fick cardiac output/index = 5.6/2.6 PVR = 1.4 WU SVR = 984 FA  sat = 87% PA sat = 58%, 55%  Echo 11/13/15  LVEF 20-25% with diffuse hypokinesis. Mild AS, Moderate MR, Severe LAE   Past Medical History:  Diagnosis Date  . Carotid artery disease ([place])    a. 04/2015 mild to mod bilat ICA stenosis;  b. 08/7827 >56% LICA stenosis, no significant dzs on right.  . Chronic combined systolic and diastolic CHF, NYHA class 4 ([place])    a. 01/2013 Echo: EF 25-30%; b. 08/2013 Echo: EF 15-20%; c. 04/2014 Echo: EF 20-25%, mild LVH, Gr1 DD, mild MR, mod dil LA.  . CKD (chronic kidney disease), stage IV ([place])    a. baseline creat of ~ 1.8;  b. 09/2015 Admitted to [facility] with AKI and hyperkalemia.  [name] History of stroke   . Hyperlipidemia   . Hypertensive heart disease with CHF (congestive heart failure) ([place])   . ICD (implantable cardioverter-defibrillator), dual, in situ    a. 09/2013 s/p MDT Hillery Aldo XT CRT-D (ser #[id number]).  . LBBB (left bundle branch block)   . Non-obstructive CAD    a. 11/2001 Cath [facility]): LM nl, LAD 18m LCX nondom, nl, RI large, nl, RCA dominant, 461m.  . Mar[place]ischemic cardiomyopathy ([place]   a. 11/2001 nonobs cath; b. 09/2013 s/p MDT ViHillery AldoT CRT-D (ser #B#[id number]); c. 04/2014 Echo: EF 20-25%;  d. Followed @ [facility] by K. AdAndree ElkMD.  . OSA (obstructive sleep apnea)    a. Does not tolerate/use CPAP.  . Pre-diabetes   .  PVD (peripheral vascular disease) ([place])    a. 10/2005 PTA: right external iliac artery angioplasty and left renal artery angioplasty and stenting;  b. 10/2015 ABI's in setting of recurrent claudication: R 0.97, L 0.77 (reduced by 0.12 from prior study).  . Renal artery stenosis ([place])    a. 10/2005 s/p L renal artery stenting.  [name] TIA (transient ischemic attack) 06/2010    Current Outpatient Prescriptions  Medication Sig Dispense Refill  . allopurinol (ZYLOPRIM) 100 MG tablet Take 100 mg by mouth daily.    [name] aspirin EC 81 MG tablet Take 81 mg by mouth daily.    [name] atorvastatin (LIPITOR) 20 MG tablet Take 20 mg by mouth daily.      . Blood Glucose Monitoring Suppl (FIFTY50 GLUCOSE METER 2.0) w/Device KIT     . clopidogrel (PLAVIX) 75 MG tablet Take 75 mg by mouth daily.    . Coenzyme Q10 (CO Q 10) 100 MG CAPS Take 100 mg by mouth daily.    [name] ENTRESTO 24-26 MG Take 1 tablet by mouth 2 (two) times daily.  3  . furosemide (LASIX) 40 MG tablet Take 2 tablets (80 mg total) by mouth daily. 60 tablet 6  . gabapentin (NEURONTIN) 300 MG capsule Take 300 mg by mouth at bedtime.    . isosorbide mononitrate (IMDUR) 30 MG 24 hr tablet Take 30 mg by mouth daily.    . Magnesium 500 MG CAPS Take 500 mg by mouth daily.    . metoprolol (TOPROL-XL) 200 MG 24 hr tablet Take 0.5 tablets (100 mg total) by mouth daily. 15 tablet 6  . niacin 500 MG tablet Take 500 mg by mouth daily with breakfast.    . OXYGEN Inhale 2 L into the lungs at bedtime.    [name] spironolactone (ALDACTONE) 25 MG tablet Take 1 tablet (25 mg total) by mouth daily. 30 tablet 6  . zolpidem (AMBIEN) 10 MG tablet Take 5 mg by mouth at bedtime.     . nitroGLYCERIN (NITROSTAT) 0.4 MG SL tablet Place 0.4 mg under the tongue every 5 (five) minutes as needed for chest pain (maximum 3 tablets).     No current facility-administered medications for this encounter.     Allergies  Allergen Reactions  . Amoxicillin Itching    Itching    . Biaxin [Clarithromycin] Itching    Lip swelling     . Erythromycin Itching  . Penicillins Hives and Itching    Has patient had a PCN reaction causing immediate rash, facial/tongue/throat swelling, SOB or lightheadedness with hypotension: Yes Has patient had a PCN reaction causing severe rash involving mucus membranes or skin necrosis: No Has patient had a PCN reaction that required hospitalization No Has patient had a PCN reaction occurring within the last 10 years: No If all of the above answers are "NO", then may proceed with Cephalosporin use.  . Shellfish Allergy Itching        . Sulfa Antibiotics Other (See Comments)    AKI and  hyperkalemia      Social History   Social History  . Marital status: Married    Spouse name: N/A  . Number of children: N/A  . Years of education: N/A   Occupational History  . Retired    Social History Main Topics  . Smoking status: Former Smoker    Years: 52.00    Types: Cigarettes    Quit date: 08/11/2004  . Smokeless tobacco: Never Used  . Alcohol use No  . Drug  use: No  . Sexual activity: Yes   Other Topics Concern  . Not on file   Social History Narrative   Retired.  Lives in [place] with his wife.  Tries to remain active.      Family History  Problem Relation Age of Onset  . Alzheimer's disease Mother   . Heart disease Father   . Heart disease Paternal Grandfather   . Heart attack Paternal Grandfather     Vitals:   01/22/16 1500  BP: (!) 110/42  Pulse: 77  SpO2: 97%  Weight: 186 lb (84.4 kg)   Orthostatics Sitting 88/60 Standing - 70/50    Wt Readings from Last 3 Encounters:  01/22/16 186 lb (84.4 kg)  01/22/16 185 lb (83.9 kg)  01/15/16 185 lb (83.9 kg)     PHYSICAL EXAM: General: Elderly. No resp difficulty. Fatigued appearing.  HEENT: normal  Neck: supple. JVP flat. Carotids 2+ bilat; no bruits. No thyromegaly or nodule noted.  Cor: PMI nondisplaced. RRR with occasional ectopy, 2/6 MR, TR Lungs: CTAB, normal effort Abdomen: soft, NT, ND, no HSM. No bruits or masses. +BS  Extremities: no cyanosis, clubbing, rash, edema Neuro: alert & orientedx3, cranial nerves grossly intact. moves all 4 extremities w/o difficulty. Affect pleasant  ASSESSMENT & PLAN:  1. Chronic systolic HF,  Echo 05/18/12 EF 20-25% - NICM --s/p CRT-D - AV delay recently adjusted.   - Volume status low . He is orthostatic. I personally check orthostatics. K and Creatinine elevated. Today I have asked him to stop spiro, entresto,  and lasix. We will re-check next week.  - Off K with hyperkalemia.  - Todays K is  5.5.  Stop spiro and entresto - Continue toprol-xl  100 mg daily -  Continue Imdur 30 mg daily.  - Reinforced importance of daily weights and to call with weight gain of 3 lbs overnight or 5 lbs in one week.  2. AKI on CKD. Creatinine going up form 2.69>2.75 . As above hold diuretics.  3. NSVT - H/O  frequent PVCs on EKG 4. Carotid stenosis > 70% on L  5. CAD - Mild non obstructive on cath 11/2015 - Continue ASA 81 mg daily, Clopidigrel 75 mg daily 6. Orthostasis- he is orthostatic today.  Hold entresto spiro and lasix.  7. Hyperkalemia- K 5.5 Stop entresto and spiro.   Follow up next week. Check BMET at that time.    Cadan Maggart NP-C   01/22/2016 3:23 PM

## 2016-01-22 NOTE — Progress Notes (Signed)
Advanced Heart Failure Medication Review by a Pharmacist  Does the patient  feel that his/her medications are working for him/her?  yes  Has the patient been experiencing any side effects to the medications prescribed?  no  Does the patient measure his/her own blood pressure or blood glucose at home?  no   Does the patient have any problems obtaining medications due to transportation or finances?   no  Understanding of regimen: good Understanding of indications: good Potential of compliance: good Patient understands to avoid NSAIDs. Patient understands to avoid decongestants.  Issues to address at subsequent visits: None   Pharmacist comments:  Mr. Scott Parsons is a pleasant 82 yo M presenting with a current medication list. He reports good compliance with his regimen and has not been taking KCl since he was told to hold it. He did not have any other medication-related questions or concerns for me at this time.   [name] Bonnye Fava, PharmD, BCPS, CPP Clinical Pharmacist Pager: [phone] Phone: [phone] 01/22/2016 3:30 PM      Time with patient: 10 minutes Preparation and documentation time: 2 minutes Total time: 12 minutes

## 2016-01-22 NOTE — Patient Instructions (Signed)
Medication Instructions:   Your physician recommends that you continue on your current medications as directed. Please refer to the Current Medication list given to you today.   If you need a refill on your cardiac medications before your next appointment, please call your pharmacy.  Labwork: BMET AND BNP    Testing/Procedures: NONE ORDER TODAY    Follow-Up: WITH DR Johney Frame IN 3 MONTHS    Any Other Special Instructions Will Be Listed Below (If Applicable).

## 2016-01-26 ENCOUNTER — Other Ambulatory Visit (HOSPITAL_COMMUNITY): Payer: Self-pay | Admitting: Internal Medicine

## 2016-01-26 ENCOUNTER — Telehealth: Payer: Self-pay

## 2016-01-26 NOTE — Telephone Encounter (Signed)
Attempted return call to patient and left detailed message advising to call Dr Bensimhon's office today regarding the weight gain since they have been following him closely for the changes in the last 2 weeks including recent lab work that was abnormal (low sodium and high creatinine). Advised to use ER if needed.  Encouraged him to call back if unable to reach anyone at the HF clinic.

## 2016-01-26 NOTE — Telephone Encounter (Signed)
Received detailed voice mail message regarding his current weight status and review of last 2 weeks with hospitalization and follow up with HF clinic.  He reported his sodium level had dropped to low and Dr Gala Romney stated he could go back to eating foods with salt and now patient has gained 5 lbs.  He wanted to know if he should send remote transmission today.  He reported he has an appointment with Dr Gala Romney tomorrow.

## 2016-01-27 ENCOUNTER — Ambulatory Visit (HOSPITAL_COMMUNITY)
Admission: RE | Admit: 2016-01-27 | Discharge: 2016-01-27 | Disposition: A | Discharge status: Home / Self Care | Payer: Medicare Other | Source: Ambulatory Visit | Attending: Internal Medicine | Admitting: Internal Medicine

## 2016-01-27 ENCOUNTER — Ambulatory Visit: Payer: Medicare Other | Admitting: Physician Assistant

## 2016-01-27 VITALS — BP 118/62 | HR 91 | Wt 188.2 lb

## 2016-01-27 DIAGNOSIS — I2510 Atherosclerotic heart disease of native coronary artery without angina pectoris: Secondary | ICD-10-CM | POA: Diagnosis not present

## 2016-01-27 DIAGNOSIS — I951 Orthostatic hypotension: Secondary | ICD-10-CM | POA: Diagnosis not present

## 2016-01-27 DIAGNOSIS — N184 Chronic kidney disease, stage 4 (severe): Secondary | ICD-10-CM | POA: Insufficient documentation

## 2016-01-27 DIAGNOSIS — I447 Left bundle-branch block, unspecified: Secondary | ICD-10-CM | POA: Diagnosis not present

## 2016-01-27 DIAGNOSIS — I493 Ventricular premature depolarization: Secondary | ICD-10-CM | POA: Insufficient documentation

## 2016-01-27 DIAGNOSIS — R7303 Prediabetes: Secondary | ICD-10-CM | POA: Diagnosis not present

## 2016-01-27 DIAGNOSIS — E785 Hyperlipidemia, unspecified: Secondary | ICD-10-CM | POA: Insufficient documentation

## 2016-01-27 DIAGNOSIS — G4733 Obstructive sleep apnea (adult) (pediatric): Secondary | ICD-10-CM | POA: Insufficient documentation

## 2016-01-27 DIAGNOSIS — Z7982 Long term (current) use of aspirin: Secondary | ICD-10-CM | POA: Diagnosis not present

## 2016-01-27 DIAGNOSIS — I130 Hypertensive heart and chronic kidney disease with heart failure and stage 1 through stage 4 chronic kidney disease, or unspecified chronic kidney disease: Secondary | ICD-10-CM | POA: Insufficient documentation

## 2016-01-27 DIAGNOSIS — Z8673 Personal history of transient ischemic attack (TIA), and cerebral infarction without residual deficits: Secondary | ICD-10-CM | POA: Diagnosis not present

## 2016-01-27 DIAGNOSIS — I5042 Chronic combined systolic (congestive) and diastolic (congestive) heart failure: Secondary | ICD-10-CM

## 2016-01-27 DIAGNOSIS — I739 Peripheral vascular disease, unspecified: Secondary | ICD-10-CM | POA: Insufficient documentation

## 2016-01-27 DIAGNOSIS — I429 Cardiomyopathy, unspecified: Secondary | ICD-10-CM | POA: Diagnosis not present

## 2016-01-27 DIAGNOSIS — I6522 Occlusion and stenosis of left carotid artery: Secondary | ICD-10-CM | POA: Insufficient documentation

## 2016-01-27 DIAGNOSIS — N179 Acute kidney failure, unspecified: Secondary | ICD-10-CM

## 2016-01-27 DIAGNOSIS — Z95810 Presence of automatic (implantable) cardiac defibrillator: Secondary | ICD-10-CM | POA: Insufficient documentation

## 2016-01-27 DIAGNOSIS — I472 Ventricular tachycardia: Secondary | ICD-10-CM | POA: Diagnosis not present

## 2016-01-27 DIAGNOSIS — Z87891 Personal history of nicotine dependence: Secondary | ICD-10-CM | POA: Insufficient documentation

## 2016-01-27 DIAGNOSIS — I5022 Chronic systolic (congestive) heart failure: Secondary | ICD-10-CM | POA: Insufficient documentation

## 2016-01-27 LAB — BASIC METABOLIC PANEL
ANION GAP: 9 (ref 5–15)
BUN: 37 mg/dL — AB (ref 6–20)
CHLORIDE: 111 mmol/L (ref 101–111)
CO2: 20 mmol/L — ABNORMAL LOW (ref 22–32)
Calcium: 9.6 mg/dL (ref 8.9–10.3)
Creatinine, Ser: 1.82 mg/dL — ABNORMAL HIGH (ref 0.61–1.24)
GFR calc Af Amer: 38 mL/min — ABNORMAL LOW (ref >60)
GFR, EST NON AFRICAN AMERICAN: 33 mL/min — AB (ref >60)
Glucose, Bld: 132 mg/dL — ABNORMAL HIGH (ref 65–99)
POTASSIUM: 4.4 mmol/L (ref 3.5–5.1)
SODIUM: 140 mmol/L (ref 135–145)

## 2016-01-27 LAB — BRAIN NATRIURETIC PEPTIDE: B NATRIURETIC PEPTIDE 5: 1889.1 pg/mL — AB (ref 0.0–100.0)

## 2016-01-27 MED ORDER — FUROSEMIDE 40 MG PO TABS: 60.0000 mg | ORAL_TABLET | Freq: Every day | ORAL | 3 refills | Status: DC

## 2016-01-27 NOTE — Patient Instructions (Signed)
Start Furosemide (Lasix) 60 mg (1 & 1/2 tab) daily  Labs today  Your physician recommends that you schedule a follow-up appointment in: 10 days

## 2016-01-28 ENCOUNTER — Encounter: Payer: Self-pay | Admitting: Internal Medicine

## 2016-01-29 ENCOUNTER — Ambulatory Visit (HOSPITAL_COMMUNITY)
Admission: RE | Admit: 2016-01-29 | Discharge: 2016-01-29 | Disposition: A | Discharge status: Home / Self Care | Payer: Medicare Other | Source: Ambulatory Visit | Attending: Cardiovascular Disease | Admitting: Cardiovascular Disease

## 2016-01-29 ENCOUNTER — Telehealth (HOSPITAL_COMMUNITY): Payer: Self-pay | Admitting: *Deleted

## 2016-01-29 DIAGNOSIS — I701 Atherosclerosis of renal artery: Secondary | ICD-10-CM

## 2016-01-29 MED ORDER — SPIRONOLACTONE 25 MG PO TABS: 12.5000 mg | ORAL_TABLET | Freq: Every day | ORAL | 3 refills | Status: DC

## 2016-01-29 NOTE — Progress Notes (Signed)
Patient ID: BARTOSZ LUGINBILL, male   DOB: [birth year], 82 y.o.   MRN: [id number]    Advanced Heart Failure Clinic Note    Primary Care: [name], MD Primary Cardiologist: Dr Gwenlyn Found Primary HF: Dr. [name]   HPI: EULAS SCHWEITZER is a 82 y.o. male with history of NICM, Chronic systolic CHF (Echo 0/9323 EF 20-25%), LBBB s/p biV ICD, CKD III-IV, HTN, PAD, Carotid stenosis, and RAS s/p renal stenting.   He is followed closely in the [facility] CHF clinic by Carolynn Serve. In 09/2015 was found to have worsening of renal fxn along with hyperkalemia. He was admitted to [facility] and his lisinopril, valsartan, and spironolactone were discontinued. Labs were stable upon f/u and he was subsequently placed on entresto.  Was taken to [facility] ED on 11/11/15 with acute worsening dyspnea. Required Bipap. Tx to [facility] for further eval. Was admitted by CCM for resp failure. Had significant symptom improvement with diuresis. Catheterization as below with mild non-obstructive CAD and well compensated hemodynamics after diuresis. Diuresed 2.2 L. Weight 190 on discharge.   Admitted 8/26-8/30 with flash edema. Diuresed with IV lasix. ICD adjusted to increase pacing. Out 10 lbs with IV lasix. Discharge weight was 180 lbs.   He presents today for follow up.  At last appointment, he was orthostatic.  Creatinine was up to 2.75.  Lasix, spironolactone, and Entresto were stopped.  He is not orthostatic when checked today.  No further lightheadedness.  Not particularly short of breath, mainly limited by leg claudication when he walks up a hill.  No orthopnea/PND.  No chest pain.  Weight is up 2 lbs.    Optivol: Fluid index < threshold but impedance starting to drop.   R/LHC 11/13/15  Mild non-obstructive CAD, and well compensated hemodynamics after diuresis.  RA = 3 RV = 36/3 PA = 34/12 (20) PCW = 12 Ao = 118/48 (72) LV = 129/18 Fick cardiac output/index = 5.6/2.6 PVR = 1.4 WU SVR = 984 FA sat = 87% PA sat = 58%,  55%  Echo 11/13/15  LVEF 20-25% with diffuse hypokinesis. Mild AS, Moderate MR, Severe LAE   Past Medical History:  Diagnosis Date  . Carotid artery disease ([place])    a. 04/2015 mild to mod bilat ICA stenosis;  b. 09/5730 >20% LICA stenosis, no significant dzs on right.  . Chronic combined systolic and diastolic CHF, NYHA class 4 ([place])    a. 01/2013 Echo: EF 25-30%; b. 08/2013 Echo: EF 15-20%; c. 04/2014 Echo: EF 20-25%, mild LVH, Gr1 DD, mild MR, mod dil LA.  . CKD (chronic kidney disease), stage IV ([place])    a. baseline creat of ~ 1.8;  b. 09/2015 Admitted to [facility] with AKI and hyperkalemia.  [name] History of stroke   . Hyperlipidemia   . Hypertensive heart disease with CHF (congestive heart failure) ([place])   . ICD (implantable cardioverter-defibrillator), dual, in situ    a. 09/2013 s/p MDT Hillery Aldo XT CRT-D (ser #[id number]).  . LBBB (left bundle branch block)   . Non-obstructive CAD    a. 11/2001 Cath [facility]): LM nl, LAD 33m LCX nondom, nl, RI large, nl, RCA dominant, 450m.  . Marland Kitchenonischemic cardiomyopathy ([place]   a. 11/2001 nonobs cath; b. 09/2013 s/p MDT ViHillery AldoT CRT-D (ser #B#[id number]); c. 04/2014 Echo: EF 20-25%;  d. Followed @ [facility] by K. AdAndree ElkMD.  . OSA (obstructive sleep apnea)    a. Does not tolerate/use CPAP.  . Pre-diabetes   . PVD (  peripheral vascular disease) ([place])    a. 10/2005 PTA: right external iliac artery angioplasty and left renal artery angioplasty and stenting;  b. 10/2015 ABI's in setting of recurrent claudication: R 0.97, L 0.77 (reduced by 0.12 from prior study).  . Renal artery stenosis ([place])    a. 10/2005 s/p L renal artery stenting.  [name] TIA (transient ischemic attack) 06/2010    Current Outpatient Prescriptions  Medication Sig Dispense Refill  . allopurinol (ZYLOPRIM) 100 MG tablet Take 100 mg by mouth daily.    [name] aspirin EC 81 MG tablet Take 81 mg by mouth daily.    [name] atorvastatin (LIPITOR) 20 MG tablet Take 20 mg by mouth daily.    . Blood Glucose  Monitoring Suppl (FIFTY50 GLUCOSE METER 2.0) w/Device KIT     . clopidogrel (PLAVIX) 75 MG tablet Take 75 mg by mouth daily.    . Coenzyme Q10 (CO Q 10) 100 MG CAPS Take 100 mg by mouth daily.    [name] gabapentin (NEURONTIN) 300 MG capsule Take 300 mg by mouth at bedtime.    . isosorbide mononitrate (IMDUR) 30 MG 24 hr tablet Take 30 mg by mouth daily.    . Magnesium 500 MG CAPS Take 500 mg by mouth daily.    . metoprolol (TOPROL-XL) 200 MG 24 hr tablet Take 0.5 tablets (100 mg total) by mouth daily. 15 tablet 6  . niacin 500 MG tablet Take 500 mg by mouth daily with breakfast.    . nitroGLYCERIN (NITROSTAT) 0.4 MG SL tablet Place 0.4 mg under the tongue every 5 (five) minutes as needed for chest pain (maximum 3 tablets).    . OXYGEN Inhale 2 L into the lungs at bedtime.    [name] zolpidem (AMBIEN) 10 MG tablet Take 5 mg by mouth at bedtime.     . furosemide (LASIX) 40 MG tablet Take 1.5 tablets (60 mg total) by mouth daily. 90 tablet 3   No current facility-administered medications for this encounter.     Allergies  Allergen Reactions  . Amoxicillin Itching    Itching    . Biaxin [Clarithromycin] Itching    Lip swelling     . Erythromycin Itching  . Penicillins Hives and Itching    Has patient had a PCN reaction causing immediate rash, facial/tongue/throat swelling, SOB or lightheadedness with hypotension: Yes Has patient had a PCN reaction causing severe rash involving mucus membranes or skin necrosis: No Has patient had a PCN reaction that required hospitalization No Has patient had a PCN reaction occurring within the last 10 years: No If all of the above answers are "NO", then may proceed with Cephalosporin use.  . Shellfish Allergy Itching        . Sulfa Antibiotics Other (See Comments)    AKI and hyperkalemia      Social History   Social History  . Marital status: Married    Spouse name: N/A  . Number of children: N/A  . Years of education: N/A   Occupational History  .  Retired    Social History Main Topics  . Smoking status: Former Smoker    Years: 52.00    Types: Cigarettes    Quit date: 08/11/2004  . Smokeless tobacco: Never Used  . Alcohol use No  . Drug use: No  . Sexual activity: Yes   Other Topics Concern  . Not on file   Social History Narrative   Retired.  Lives in [place] with his wife.  Tries to remain  active.      Family History  Problem Relation Age of Onset  . Alzheimer's disease Mother   . Heart disease Father   . Heart disease Paternal Grandfather   . Heart attack Paternal Grandfather     Vitals:   01/27/16 1429  BP: 118/62  Pulse: 91  SpO2: 99%  Weight: 188 lb 4 oz (85.4 kg)    Wt Readings from Last 3 Encounters:  01/27/16 188 lb 4 oz (85.4 kg)  01/22/16 186 lb (84.4 kg)  01/22/16 185 lb (83.9 kg)     PHYSICAL EXAM: General: Elderly. No resp difficulty. Fatigued appearing.  HEENT: normal  Neck: supple. JVP flat. Carotids 2+ bilat; no bruits. No thyromegaly or nodule noted.  Cor: PMI nondisplaced. RRR with occasional ectopy, 2/6 MR, TR Lungs: CTAB, normal effort Abdomen: soft, NT, ND, no HSM. No bruits or masses. +BS  Extremities: no cyanosis, clubbing, rash, edema Neuro: alert & orientedx3, cranial nerves grossly intact. moves all 4 extremities w/o difficulty. Affect pleasant  ASSESSMENT & PLAN:  1. Chronic systolic HF:  Echo 08/12/89 EF 20-25%.  Nonischemic cardiomyopathy.  Medtronic CRT-D.  Today, orthostasis has resolved.  No further lightheadedness.  He does not look volume overloaded on exam though Optivol suggests that fluid is starting to re-accumulate.   - Restart Lasix at 60 mg daily (was taking 80 mg daily or 120 mg daily).  - BMET/BNP today, if improved would start low dose losartan or spironolactone.  - Repeat BMET in 10 days after starting Lasix.  - Continue current Toprol XL.  2. AKI on CKD: Recheck BMET today, hopefully creatinine coming down.  3. NSVT and frequent PVCs: He remains on  Toprol XL.  4. Carotid stenosis > 70% on L  5. CAD - Mild non obstructive on cath 11/2015 - Continue ASA 81 mg daily, Clopidigrel 75 mg daily, statin  Followup in 10 days.   Loralie Champagne   01/29/2016 12:15 AM

## 2016-01-29 NOTE — Telephone Encounter (Signed)
Notes Recorded by Modesta Messing, CMA on 01/29/2016 at 4:15 PM EDT Patient aware. pts wife aware medications updated in patients chart. ------  Notes Recorded by Noralee Space, RN on 01/29/2016 at 12:47 PM EDT Left message for patient to call back.  ------  Notes Recorded by Laurey Morale, MD on 01/29/2016 at 12:22 AM EDT Creatinine better. May restart on spironolactone 12.5 mg daily.    Ref Range & Units 2d ago 7d ago 2wk ago   Sodium 135 - 145 mmol/L 140  133   135    Potassium 3.5 - 5.1 mmol/L 4.4  5.5   5.6     Chloride 101 - 111 mmol/L 111  99   103    CO2 22 - 32 mmol/L 20   25  21      Glucose, Bld 65 - 99 mg/dL 818   403   754     BUN 6 - 20 mg/dL 37   76   71     Creatinine, Ser 0.61 - 1.24 mg/dL 3.60   6.77   0.34     Calcium 8.9 - 10.3 mg/dL 9.6  9.7  9.3    GFR calc non Af Amer >60 mL/min 33   20   20     GFR calc Af Amer >60 mL/min 38   23CM   24CM

## 2016-02-02 ENCOUNTER — Ambulatory Visit (INDEPENDENT_AMBULATORY_CARE_PROVIDER_SITE_OTHER): Payer: Medicare Other

## 2016-02-02 ENCOUNTER — Telehealth: Payer: Self-pay | Admitting: *Deleted

## 2016-02-02 DIAGNOSIS — Z95810 Presence of automatic (implantable) cardiac defibrillator: Secondary | ICD-10-CM

## 2016-02-02 DIAGNOSIS — I5042 Chronic combined systolic (congestive) and diastolic (congestive) heart failure: Secondary | ICD-10-CM | POA: Diagnosis not present

## 2016-02-02 NOTE — Progress Notes (Signed)
EPIC Encounter for ICM Monitoring  Patient Name: Scott Parsons is a 82 y.o. male Date: 02/02/2016 Primary Care Physican: [name], MD Primary Cardiologist: [facility] Electrophysiologist: Allred Dry Weight:    unknown Bi-V Pacing:  79.7%                    Heart Failure questions reviewed, pt feeling better than he has been for the last couple of months.  No symptoms today.   Thoracic impedance abnormal suggesting fluid accumulation which correlates with the temporary stopping of Furosemide.  He has appointment with Tonye Becket, NP 02/04/2016 for follow up.  Patient reported he was advised by HF clinic to stop Furosemide for 5 days and last week restarted on dosage of 60 mg daily.    Recommendations: Advised he needs to continue to follow low salt diet.  Follow-up plan: ICM clinic phone appointment on 02/18/2016.  Copy of ICM check sent to primary cardiologist and device physician.   ICM trend: 02/02/2016       Karie Soda, RN 02/02/2016 3:04 PM

## 2016-02-02 NOTE — Telephone Encounter (Signed)
LMOVM requesting call back.  Gave device clinic phone number for return call.  New A-flutter on remote transmission, episode in progress since 01/31/16, on ASA 81mg  and Plavix.  Called AHF clinic per Dr. Jenel Lucks recommendation as patient has an appointment with Tonye Becket, NP on Wednesday, 9/27.  Herbert Seta, RN made aware of new AFl.  Report faxed to AHF clinic, confirmation received.  Patient returned call.  He states he has been feeling fairly SOB for the past week, but it has not worsened in the past two days.  Made patient aware of new A-flutter and advised of Dr. Jenel Lucks recommendation to discuss with Tonye Becket, NP at appointment on 9/27.  Patient is agreeable.  Call transferred to Randon Goldsmith, RN for ICM follow-up (see phone note).

## 2016-02-04 ENCOUNTER — Ambulatory Visit (HOSPITAL_COMMUNITY)
Admission: RE | Admit: 2016-02-04 | Discharge: 2016-02-04 | Disposition: A | Discharge status: Home / Self Care | Payer: Medicare Other | Source: Ambulatory Visit | Attending: Internal Medicine | Admitting: Internal Medicine

## 2016-02-04 ENCOUNTER — Encounter: Payer: Self-pay | Admitting: Internal Medicine

## 2016-02-04 VITALS — BP 118/62 | HR 98 | Wt 188.8 lb

## 2016-02-04 DIAGNOSIS — I480 Paroxysmal atrial fibrillation: Secondary | ICD-10-CM

## 2016-02-04 DIAGNOSIS — Z8673 Personal history of transient ischemic attack (TIA), and cerebral infarction without residual deficits: Secondary | ICD-10-CM | POA: Diagnosis not present

## 2016-02-04 DIAGNOSIS — I25119 Atherosclerotic heart disease of native coronary artery with unspecified angina pectoris: Secondary | ICD-10-CM | POA: Diagnosis not present

## 2016-02-04 DIAGNOSIS — Z7901 Long term (current) use of anticoagulants: Secondary | ICD-10-CM | POA: Insufficient documentation

## 2016-02-04 DIAGNOSIS — N183 Chronic kidney disease, stage 3 (moderate): Secondary | ICD-10-CM | POA: Diagnosis not present

## 2016-02-04 DIAGNOSIS — I5042 Chronic combined systolic (congestive) and diastolic (congestive) heart failure: Secondary | ICD-10-CM | POA: Diagnosis not present

## 2016-02-04 DIAGNOSIS — I472 Ventricular tachycardia: Secondary | ICD-10-CM | POA: Insufficient documentation

## 2016-02-04 DIAGNOSIS — I429 Cardiomyopathy, unspecified: Secondary | ICD-10-CM | POA: Insufficient documentation

## 2016-02-04 DIAGNOSIS — N179 Acute kidney failure, unspecified: Secondary | ICD-10-CM | POA: Diagnosis not present

## 2016-02-04 DIAGNOSIS — Z95828 Presence of other vascular implants and grafts: Secondary | ICD-10-CM | POA: Diagnosis not present

## 2016-02-04 DIAGNOSIS — I130 Hypertensive heart and chronic kidney disease with heart failure and stage 1 through stage 4 chronic kidney disease, or unspecified chronic kidney disease: Secondary | ICD-10-CM | POA: Insufficient documentation

## 2016-02-04 DIAGNOSIS — G4733 Obstructive sleep apnea (adult) (pediatric): Secondary | ICD-10-CM | POA: Insufficient documentation

## 2016-02-04 DIAGNOSIS — E785 Hyperlipidemia, unspecified: Secondary | ICD-10-CM | POA: Insufficient documentation

## 2016-02-04 DIAGNOSIS — I5022 Chronic systolic (congestive) heart failure: Secondary | ICD-10-CM | POA: Diagnosis present

## 2016-02-04 DIAGNOSIS — I447 Left bundle-branch block, unspecified: Secondary | ICD-10-CM | POA: Diagnosis not present

## 2016-02-04 DIAGNOSIS — N184 Chronic kidney disease, stage 4 (severe): Secondary | ICD-10-CM | POA: Insufficient documentation

## 2016-02-04 DIAGNOSIS — I4891 Unspecified atrial fibrillation: Secondary | ICD-10-CM | POA: Diagnosis not present

## 2016-02-04 DIAGNOSIS — Z7982 Long term (current) use of aspirin: Secondary | ICD-10-CM | POA: Diagnosis not present

## 2016-02-04 DIAGNOSIS — R7303 Prediabetes: Secondary | ICD-10-CM | POA: Insufficient documentation

## 2016-02-04 DIAGNOSIS — Z95810 Presence of automatic (implantable) cardiac defibrillator: Secondary | ICD-10-CM | POA: Diagnosis not present

## 2016-02-04 DIAGNOSIS — I739 Peripheral vascular disease, unspecified: Secondary | ICD-10-CM | POA: Diagnosis not present

## 2016-02-04 DIAGNOSIS — I6523 Occlusion and stenosis of bilateral carotid arteries: Secondary | ICD-10-CM | POA: Insufficient documentation

## 2016-02-04 DIAGNOSIS — Z87891 Personal history of nicotine dependence: Secondary | ICD-10-CM | POA: Insufficient documentation

## 2016-02-04 DIAGNOSIS — I5043 Acute on chronic combined systolic (congestive) and diastolic (congestive) heart failure: Secondary | ICD-10-CM

## 2016-02-04 LAB — CBC
HCT: 36.6 % — ABNORMAL LOW (ref 39.0–52.0)
Hemoglobin: 12 g/dL — ABNORMAL LOW (ref 13.0–17.0)
MCH: 32.5 pg (ref 26.0–34.0)
MCHC: 32.8 g/dL (ref 30.0–36.0)
MCV: 99.2 fL (ref 78.0–100.0)
PLATELETS: 119 10*3/uL — AB (ref 150–400)
RBC: 3.69 MIL/uL — AB (ref 4.22–5.81)
RDW: 13.3 % (ref 11.5–15.5)
WBC: 4.2 10*3/uL (ref 4.0–10.5)

## 2016-02-04 LAB — BASIC METABOLIC PANEL
Anion gap: 7 (ref 5–15)
BUN: 32 mg/dL — AB (ref 6–20)
CALCIUM: 9.1 mg/dL (ref 8.9–10.3)
CO2: 26 mmol/L (ref 22–32)
Chloride: 105 mmol/L (ref 101–111)
Creatinine, Ser: 2.07 mg/dL — ABNORMAL HIGH (ref 0.61–1.24)
GFR calc Af Amer: 32 mL/min — ABNORMAL LOW (ref >60)
GFR, EST NON AFRICAN AMERICAN: 28 mL/min — AB (ref >60)
GLUCOSE: 151 mg/dL — AB (ref 65–99)
POTASSIUM: 3.9 mmol/L (ref 3.5–5.1)
SODIUM: 138 mmol/L (ref 135–145)

## 2016-02-04 LAB — BRAIN NATRIURETIC PEPTIDE: B Natriuretic Peptide: 1594.5 pg/mL — ABNORMAL HIGH (ref 0.0–100.0)

## 2016-02-04 MED ORDER — APIXABAN 2.5 MG PO TABS: 2.5000 mg | ORAL_TABLET | Freq: Two times a day (BID) | ORAL | 3 refills | Status: DC

## 2016-02-04 NOTE — Progress Notes (Signed)
Patient ID: Scott Parsons, male   DOB: 1929-12-20, 82 y.o.   MRN: [id number]    Advanced Heart Failure Clinic Note    Primary Care: [name], MD Primary Cardiologist: Dr Gwenlyn Found Primary HF: Dr. [name]   HPI: [name] is a 82 y.o. male with history of NICM, Chronic systolic CHF (Echo 0/8657 EF 20-25%), LBBB s/p biV ICD, CKD III-IV, HTN, PAD, Carotid stenosis, and RAS s/p renal stenting.   He is followed closely in the [facility] CHF clinic by Carolynn Serve. In 09/2015 was found to have worsening of renal fxn along with hyperkalemia. He was admitted to [facility] and his lisinopril, valsartan, and spironolactone were discontinued. Labs were stable upon f/u and he was subsequently placed on entresto.  Was taken to [place] ED on 11/11/15 with acute worsening dyspnea. Required Bipap. Tx to [facility] for further eval. Was admitted by CCM for resp failure. Had significant symptom improvement with diuresis. Catheterization as below with mild non-obstructive CAD and well compensated hemodynamics after diuresis. Diuresed 2.2 L. Weight 190 on discharge.   Admitted 8/26-8/30 with flash edema. Diuresed with IV lasix. ICD adjusted to increase pacing. Out 10 lbs with IV lasix. Discharge weight was 180 lbs.   He presents today for follow up.  Last  Visit, Dr Aundra Dubin started 60 mg lasix and 12.5 mg spiro daily. Mild dyspnea with exertion. Complaining of fatiuge. Denies PND/Orthopnea. No chest pain.  Working 2-3 hours in the yard. Appetite ok . Weight at home 184 pounds. Taking all medications.   Labs 01/27/2016: Creatinine 1.82 K 4.4  Labs 01/22/16: Creatinine 2.7 K 5.5  Labs 01/15/2016: Creatinine 2.69 K 5.6  Labs    [facility] 11/13/15  Mild non-obstructive CAD, and well compensated hemodynamics after diuresis.  RA = 3 RV = 36/3 PA = 34/12 (20) PCW = 12 Ao = 118/48 (72) LV = 129/18 Fick cardiac output/index = 5.6/2.6 PVR = 1.4 WU SVR = 984 FA sat = 87% PA sat = 58%, 55%  Echo 11/13/15  LVEF 20-25% with  diffuse hypokinesis. Mild AS, Moderate MR, Severe LAE   Past Medical History:  Diagnosis Date  . Carotid artery disease ([place])    a. 04/2015 mild to mod bilat ICA stenosis;  b. 12/4694 >29% LICA stenosis, no significant dzs on right.  . Chronic combined systolic and diastolic CHF, NYHA class 4 ([place])    a. 01/2013 Echo: EF 25-30%; b. 08/2013 Echo: EF 15-20%; c. 04/2014 Echo: EF 20-25%, mild LVH, Gr1 DD, mild MR, mod dil LA.  . CKD (chronic kidney disease), stage IV ([place])    a. baseline creat of ~ 1.8;  b. 09/2015 Admitted to [facility] with AKI and hyperkalemia.  [name] History of stroke   . Hyperlipidemia   . Hypertensive heart disease with CHF (congestive heart failure) ([place])   . ICD (implantable cardioverter-defibrillator), dual, in situ    a. 09/2013 s/p MDT Hillery Aldo XT CRT-D (ser #[id number]).  . LBBB (left bundle branch block)   . Non-obstructive CAD    a. 11/2001 Cath [facility]): LM nl, LAD 60m LCX nondom, nl, RI large, nl, RCA dominant, 437m.  . Marland Kitchenonischemic cardiomyopathy ([place]   a. 11/2001 nonobs cath; b. 09/2013 s/p MDT ViHillery AldoT CRT-D (ser #B#[id number]); c. 04/2014 Echo: EF 20-25%;  d. Followed @ [facility] by K. AdAndree ElkMD.  . OSA (obstructive sleep apnea)    a. Does not tolerate/use CPAP.  . Pre-diabetes   . PVD (peripheral vascular disease) ([place]  a. 10/2005 PTA: right external iliac artery angioplasty and left renal artery angioplasty and stenting;  b. 10/2015 ABI's in setting of recurrent claudication: R 0.97, L 0.77 (reduced by 0.12 from prior study).  . Renal artery stenosis ([place])    a. 10/2005 s/p L renal artery stenting.  [name] TIA (transient ischemic attack) 06/2010    Current Outpatient Prescriptions  Medication Sig Dispense Refill  . allopurinol (ZYLOPRIM) 100 MG tablet Take 100 mg by mouth daily.    [name] aspirin EC 81 MG tablet Take 81 mg by mouth daily.    [name] atorvastatin (LIPITOR) 20 MG tablet Take 20 mg by mouth daily.    . Blood Glucose Monitoring Suppl (FIFTY50 GLUCOSE METER 2.0)  w/Device KIT     . clopidogrel (PLAVIX) 75 MG tablet Take 75 mg by mouth daily.    . Coenzyme Q10 (CO Q 10) 100 MG CAPS Take 100 mg by mouth daily.    . furosemide (LASIX) 40 MG tablet Take 1.5 tablets (60 mg total) by mouth daily. 90 tablet 3  . gabapentin (NEURONTIN) 300 MG capsule Take 300 mg by mouth at bedtime.    . isosorbide mononitrate (IMDUR) 30 MG 24 hr tablet Take 30 mg by mouth daily.    . Magnesium 500 MG CAPS Take 500 mg by mouth daily.    . metoprolol (TOPROL-XL) 200 MG 24 hr tablet Take 0.5 tablets (100 mg total) by mouth daily. 15 tablet 6  . niacin 500 MG tablet Take 500 mg by mouth daily with breakfast.    . OXYGEN Inhale 2 L into the lungs at bedtime.    [name] spironolactone (ALDACTONE) 25 MG tablet Take 0.5 tablets (12.5 mg total) by mouth daily. 45 tablet 3  . zolpidem (AMBIEN) 10 MG tablet Take 5 mg by mouth at bedtime.     . nitroGLYCERIN (NITROSTAT) 0.4 MG SL tablet Place 0.4 mg under the tongue every 5 (five) minutes as needed for chest pain (maximum 3 tablets).     No current facility-administered medications for this encounter.     Allergies  Allergen Reactions  . Amoxicillin Itching    Itching    . Biaxin [Clarithromycin] Itching    Lip swelling     . Erythromycin Itching  . Penicillins Hives and Itching    Has patient had a PCN reaction causing immediate rash, facial/tongue/throat swelling, SOB or lightheadedness with hypotension: Yes Has patient had a PCN reaction causing severe rash involving mucus membranes or skin necrosis: No Has patient had a PCN reaction that required hospitalization No Has patient had a PCN reaction occurring within the last 10 years: No If all of the above answers are "NO", then may proceed with Cephalosporin use.  . Shellfish Allergy Itching        . Sulfa Antibiotics Other (See Comments)    AKI and hyperkalemia      Social History   Social History  . Marital status: Married    Spouse name: N/A  . Number of children:  N/A  . Years of education: N/A   Occupational History  . Retired    Social History Main Topics  . Smoking status: Former Smoker    Years: 52.00    Types: Cigarettes    Quit date: 08/11/2004  . Smokeless tobacco: Never Used  . Alcohol use No  . Drug use: No  . Sexual activity: Yes   Other Topics Concern  . Not on file   Social History Narrative   Retired.  Lives in [place] with his wife.  Tries to remain active.      Family History  Problem Relation Age of Onset  . Alzheimer's disease Mother   . Heart disease Father   . Heart disease Paternal Grandfather   . Heart attack Paternal Grandfather     Vitals:   02/04/16 1442  BP: 118/62  Pulse: 98  SpO2: 99%  Weight: 188 lb 12.8 oz (85.6 kg)    Wt Readings from Last 3 Encounters:  02/04/16 188 lb 12.8 oz (85.6 kg)  01/27/16 188 lb 4 oz (85.4 kg)  01/22/16 186 lb (84.4 kg)     PHYSICAL EXAM: General: Elderly. No resp difficulty.  HEENT: normal  Neck: supple. JVP 8-9. Carotids 2+ bilat; no bruits. No thyromegaly or nodule noted.  Cor: PMI nondisplaced. Irr, Irr, 2/6 MR, TR Lungs: CTAB, normal effort Abdomen: soft, NT, ND, no HSM. No bruits or masses. +BS  Extremities: no cyanosis, clubbing, rash, trace edema Neuro: alert & orientedx3, cranial nerves grossly intact. moves all 4 extremities w/o difficulty. Affect pleasant  ASSESSMENT & PLAN:  1. Chronic systolic HF:  Echo 11/14/91 EF 20-25%.  Nonischemic cardiomyopathy.  Medtronic CRT-D.   - NYHA III. Volume status ok but may trend up with A fib.  Continue Lasix at 60 mg daily (was taking 80 mg daily or 120 mg daily).  Continue spiro to 12.5 mg daily.   - BMET/BNP    - Continue current Toprol XL.  2. AKI on CKD: Recheck BMET today 3. NSVT and frequent PVCs: He remains on Toprol XL.  4. Carotid stenosis > 70% on L  5. CAD - Mild non obstructive on cath 11/2015 - Continue ASA 81 mg daily, Clopidigrel 75 mg daily, statin 6. A fib - New onset 9/23 deivice  interrogation confims. Stop aspirin and plavix. Start eliquis 2.5 mg tiwce a day. May need to add amio.   Follow up in 14 days with [employer] in the A fib clinic.   Follow up with Dr Aundra Dubin in 6 weeks.   Amy Clegg NP-C   02/04/2016 3:04 PM    Patient seen and examined with Darrick Grinder, NP. We discussed all aspects of the encounter. I agree with the assessment and plan as stated above.   Long talk with him about his situation. He has very tenuous HF with NYHA III and maybe even IIIB symptoms at times. Volume status trending up slowly. Now had new onset AF with loss of CRT by device interrogation.  Will start dose-adjusted Eliquis and plan DC-CY in 4-6 weeks. Echo reviewed and LA size > 5 cm so likelihood of maintaining NSR is low but will hold off on amio for now.   Total time spent 35 minutes. Over half that time spent discussing above.   Orlena Garmon,MD 3:45 PM

## 2016-02-04 NOTE — Patient Instructions (Signed)
Routine lab work today. Will notify you of abnormal results, otherwise no news is good news!  START Eliquis 2.5 mg tablet twice daily.  STOP Aspirin.  STOP Plavix.  Follow up 6 weeks with Dr. Shirlee Latch.  Do the following things EVERYDAY: 1) Weigh yourself in the morning before breakfast. Write it down and keep it in a log. 2) Take your medicines as prescribed 3) Eat low salt foods-Limit salt (sodium) to 2000 mg per day.  4) Stay as active as you can everyday 5) Limit all fluids for the day to less than 2 liters

## 2016-02-04 NOTE — Progress Notes (Signed)
Advanced Heart Failure Medication Review by a Pharmacist  Does the patient  feel that his/her medications are working for him/her?  yes  Has the patient been experiencing any side effects to the medications prescribed?  no  Does the patient measure his/her own blood pressure or blood glucose at home?  yes   Does the patient have any problems obtaining medications due to transportation or finances?   no  Understanding of regimen: good Understanding of indications: good Potential of compliance: good Patient understands to avoid NSAIDs. Patient understands to avoid decongestants.  Issues to address at subsequent visits: None   Pharmacist comments:  Scott Parsons is a pleasant 82 yo M presenting without a medication list but with good recall of his regimen. He reports excellent compliance with his regimen but does state that he has felt more SOB for the last 2 weeks or so. He did not have any other medication-related questions or concerns for me at this time.   [name] Bonnye Fava, PharmD, BCPS, CPP Clinical Pharmacist Pager: [phone] Phone: [phone] 02/04/2016 2:52 PM      Time with patient: 10 minutes Preparation and documentation time: 2 minutes Total time: 12 minutes

## 2016-02-10 ENCOUNTER — Encounter: Payer: Self-pay | Admitting: Internal Medicine

## 2016-02-17 ENCOUNTER — Telehealth (HOSPITAL_COMMUNITY): Payer: Self-pay | Admitting: *Deleted

## 2016-02-17 ENCOUNTER — Ambulatory Visit (HOSPITAL_COMMUNITY)
Admission: RE | Admit: 2016-02-17 | Discharge: 2016-02-17 | Disposition: A | Discharge status: Home / Self Care | Payer: Medicare Other | Source: Ambulatory Visit | Attending: Nurse Practitioner | Admitting: Nurse Practitioner

## 2016-02-17 VITALS — BP 116/76 | HR 91 | Ht 74.0 in | Wt 181.8 lb

## 2016-02-17 DIAGNOSIS — I481 Persistent atrial fibrillation: Secondary | ICD-10-CM | POA: Diagnosis present

## 2016-02-17 DIAGNOSIS — Z8673 Personal history of transient ischemic attack (TIA), and cerebral infarction without residual deficits: Secondary | ICD-10-CM | POA: Insufficient documentation

## 2016-02-17 DIAGNOSIS — I6523 Occlusion and stenosis of bilateral carotid arteries: Secondary | ICD-10-CM | POA: Insufficient documentation

## 2016-02-17 DIAGNOSIS — Z7901 Long term (current) use of anticoagulants: Secondary | ICD-10-CM | POA: Diagnosis not present

## 2016-02-17 DIAGNOSIS — I10 Essential (primary) hypertension: Secondary | ICD-10-CM

## 2016-02-17 DIAGNOSIS — I739 Peripheral vascular disease, unspecified: Secondary | ICD-10-CM | POA: Insufficient documentation

## 2016-02-17 DIAGNOSIS — I429 Cardiomyopathy, unspecified: Secondary | ICD-10-CM | POA: Diagnosis not present

## 2016-02-17 DIAGNOSIS — E785 Hyperlipidemia, unspecified: Secondary | ICD-10-CM | POA: Insufficient documentation

## 2016-02-17 DIAGNOSIS — I5042 Chronic combined systolic (congestive) and diastolic (congestive) heart failure: Secondary | ICD-10-CM | POA: Diagnosis not present

## 2016-02-17 DIAGNOSIS — G4733 Obstructive sleep apnea (adult) (pediatric): Secondary | ICD-10-CM | POA: Diagnosis not present

## 2016-02-17 DIAGNOSIS — I2510 Atherosclerotic heart disease of native coronary artery without angina pectoris: Secondary | ICD-10-CM | POA: Insufficient documentation

## 2016-02-17 DIAGNOSIS — R7303 Prediabetes: Secondary | ICD-10-CM | POA: Diagnosis not present

## 2016-02-17 DIAGNOSIS — Z87891 Personal history of nicotine dependence: Secondary | ICD-10-CM | POA: Diagnosis not present

## 2016-02-17 DIAGNOSIS — I447 Left bundle-branch block, unspecified: Secondary | ICD-10-CM | POA: Insufficient documentation

## 2016-02-17 DIAGNOSIS — I4819 Other persistent atrial fibrillation: Secondary | ICD-10-CM

## 2016-02-17 DIAGNOSIS — N184 Chronic kidney disease, stage 4 (severe): Secondary | ICD-10-CM

## 2016-02-17 DIAGNOSIS — I428 Other cardiomyopathies: Secondary | ICD-10-CM

## 2016-02-17 LAB — BASIC METABOLIC PANEL
Anion gap: 7 (ref 5–15)
BUN: 39 mg/dL — AB (ref 6–20)
CALCIUM: 9.2 mg/dL (ref 8.9–10.3)
CO2: 27 mmol/L (ref 22–32)
CREATININE: 1.97 mg/dL — AB (ref 0.61–1.24)
Chloride: 103 mmol/L (ref 101–111)
GFR calc Af Amer: 34 mL/min — ABNORMAL LOW (ref >60)
GFR, EST NON AFRICAN AMERICAN: 30 mL/min — AB (ref >60)
Glucose, Bld: 223 mg/dL — ABNORMAL HIGH (ref 65–99)
POTASSIUM: 3.7 mmol/L (ref 3.5–5.1)
SODIUM: 137 mmol/L (ref 135–145)

## 2016-02-17 LAB — CBC
HCT: 39.6 % (ref 39.0–52.0)
Hemoglobin: 13.1 g/dL (ref 13.0–17.0)
MCH: 32.9 pg (ref 26.0–34.0)
MCHC: 33.1 g/dL (ref 30.0–36.0)
MCV: 99.5 fL (ref 78.0–100.0)
PLATELETS: 239 10*3/uL (ref 150–400)
RBC: 3.98 MIL/uL — AB (ref 4.22–5.81)
RDW: 13.4 % (ref 11.5–15.5)
WBC: 7.6 10*3/uL (ref 4.0–10.5)

## 2016-02-17 NOTE — Patient Instructions (Addendum)
Cardioversion scheduled for Wednesday, October 18th  - Arrive at the [facility] and go to admitting at 7:30AM  -Do not eat or drink anything after midnight the night prior to your procedure.  - Take all your medication with a sip of water prior to arrival.  - You will not be able to drive home after your procedure.   Move up appt with PA/NP in heart failure clinic. Follow up in 2-3 weeks.

## 2016-02-17 NOTE — Telephone Encounter (Signed)
preath for TEE #[id number] scheduled for 02/25/16.

## 2016-02-17 NOTE — Progress Notes (Signed)
Primary Care Physician: [name], MD Primary Cardiologist: [place] Primary Electrophysiologist: Scott Parsons is a 82 y.o. male with a history of congestive heart failure and was recently found to have persistent atrial fibrillation with subsequent loss of CRT pacing.  He was started on anticoagulation by the AHF team at last heart failure visit. He was admitted to the hospital in [facility] last week with worsening shortness of breath and was diuresed. He states that he feels "washed out" and has no appetite. Today, he  denies symptoms of palpitations, chest pain, PND,  dizziness, presyncope, syncope, snoring, daytime somnolence, bleeding, or neurologic sequela.     Past Medical History:  Diagnosis Date  . Carotid artery disease ([place])    a. 04/2015 mild to mod bilat ICA stenosis;  b. 05/1912 >78% LICA stenosis, no significant dzs on right.  . Chronic combined systolic and diastolic CHF, NYHA class 4 ([place])    a. 01/2013 Echo: EF 25-30%; b. 08/2013 Echo: EF 15-20%; c. 04/2014 Echo: EF 20-25%, mild LVH, Gr1 DD, mild MR, mod dil LA.  . CKD (chronic kidney disease), stage IV ([place])    a. baseline creat of ~ 1.8;  b. 09/2015 Admitted to [facility] with AKI and hyperkalemia.  [name] History of stroke   . Hyperlipidemia   . Hypertensive heart disease with CHF (congestive heart failure) ([place])   . ICD (implantable cardioverter-defibrillator), dual, in situ    a. 09/2013 s/p MDT Hillery Aldo XT CRT-D (ser #[id number]).  . LBBB (left bundle branch block)   . Non-obstructive CAD    a. 11/2001 Cath [facility]): LM nl, LAD 36m LCX nondom, nl, RI large, nl, RCA dominant, 469m.  . Marland Kitchenonischemic cardiomyopathy ([place]   a. 11/2001 nonobs cath; b. 09/2013 s/p MDT ViHillery AldoT CRT-D (ser #B#[id number]); c. 04/2014 Echo: EF 20-25%;  d. Followed @ [facility] by K. AdAndree ElkMD.  . OSA (obstructive sleep apnea)    a. Does not tolerate/use CPAP.  . Pre-diabetes   . PVD (peripheral vascular disease) ([place]   a. 10/2005  PTA: right external iliac artery angioplasty and left renal artery angioplasty and stenting;  b. 10/2015 ABI's in setting of recurrent claudication: R 0.97, L 0.77 (reduced by 0.12 from prior study).  . Renal artery stenosis ([place]   a. 10/2005 s/p L renal artery stenting.  . Marland KitchenIA (transient ischemic attack) 06/2010   Past Surgical History:  Procedure Laterality Date  . BI-VENTRICULAR IMPLANTABLE CARDIOVERTER DEFIBRILLATOR N/A 09/13/2013   Procedure: BI-VENTRICULAR IMPLANTABLE CARDIOVERTER DEFIBRILLATOR  (CRT-D);  Surgeon: JaCoralyn MarkMD;  [facility];  Service: Cardiovascular;  Laterality: N/A;  . BI-VENTRICULAR IMPLANTABLE CARDIOVERTER DEFIBRILLATOR  (CRT-D)  09-13-2013   MDT CRTD implanted by Dr AlRayann Heman. CARDIAC CATHETERIZATION  2003   Mild LV dysfuntion. he had apical and inferoapical wall motion abnormalities and EF of 45%.  . Marland KitchenARDIAC CATHETERIZATION  10/18/2005   STENTS: left renal artery as well as both iliac arteries. Was stented with 1252m 4mm70mart Nitinol self-expanding stent replaced with a 9mm 85mmm b3moon at nominal pressures, resulting in a reduction od 50% proximal right external iliac artery  stenosis to 0% residual.  . CARDIAC CATHETERIZATION N/A 11/13/2015   Procedure: Right/Left Heart Cath and Coronary Angiography;  Surgeon: DanielJolaine Artist [facility];  Service: Cardiovascular;  Laterality: N/A;  . HAND SURGERY  01/1995  . HERNIA REPAIR      Current  Outpatient Prescriptions  Medication Sig Dispense Refill  . allopurinol (ZYLOPRIM) 100 MG tablet Take 100 mg by mouth daily.    [name] apixaban (ELIQUIS) 2.5 MG TABS tablet Take 1 tablet (2.5 mg total) by mouth 2 (two) times daily. 60 tablet 3  . atorvastatin (LIPITOR) 20 MG tablet Take 20 mg by mouth daily.    . Blood Glucose Monitoring Suppl (FIFTY50 GLUCOSE METER 2.0) w/Device KIT     . Coenzyme Q10 (CO Q 10) 100 MG CAPS Take 100 mg by mouth daily.    . furosemide (LASIX) 40 MG tablet Take  1.5 tablets (60 mg total) by mouth daily. 90 tablet 3  . gabapentin (NEURONTIN) 300 MG capsule Take 300 mg by mouth at bedtime.    . isosorbide mononitrate (IMDUR) 30 MG 24 hr tablet Take 30 mg by mouth daily.    . Magnesium 500 MG CAPS Take 500 mg by mouth daily.    . metoprolol (TOPROL-XL) 200 MG 24 hr tablet Take 0.5 tablets (100 mg total) by mouth daily. 15 tablet 6  . niacin 500 MG tablet Take 500 mg by mouth daily with breakfast.    . nitroGLYCERIN (NITROSTAT) 0.4 MG SL tablet Place 0.4 mg under the tongue every 5 (five) minutes as needed for chest pain (maximum 3 tablets).    . OXYGEN Inhale 2 L into the lungs at bedtime.    [name] spironolactone (ALDACTONE) 25 MG tablet Take 0.5 tablets (12.5 mg total) by mouth daily. 45 tablet 3  . zolpidem (AMBIEN) 10 MG tablet Take 5 mg by mouth at bedtime.      No current facility-administered medications for this encounter.     Allergies  Allergen Reactions  . Amoxicillin Itching    Itching    . Biaxin [Clarithromycin] Itching    Lip swelling     . Erythromycin Itching  . Penicillins Hives and Itching    Has patient had a PCN reaction causing immediate rash, facial/tongue/throat swelling, SOB or lightheadedness with hypotension: Yes Has patient had a PCN reaction causing severe rash involving mucus membranes or skin necrosis: No Has patient had a PCN reaction that required hospitalization No Has patient had a PCN reaction occurring within the last 10 years: No If all of the above answers are "NO", then may proceed with Cephalosporin use.  . Shellfish Allergy Itching        . Sulfa Antibiotics Other (See Comments)    AKI and hyperkalemia    Social History   Social History  . Marital status: Married    Spouse name: N/A  . Number of children: N/A  . Years of education: N/A   Occupational History  . Retired    Social History Main Topics  . Smoking status: Former Smoker    Years: 52.00    Types: Cigarettes    Quit date: 08/11/2004   . Smokeless tobacco: Never Used  . Alcohol use No  . Drug use: No  . Sexual activity: Yes   Other Topics Concern  . Not on file   Social History Narrative   Retired.  Lives in [place] with his wife.  Tries to remain active.    Family History  Problem Relation Age of Onset  . Alzheimer's disease Mother   . Heart disease Father   . Heart disease Paternal Grandfather   . Heart attack Paternal Grandfather     ROS- All systems are reviewed and negative except as per the HPI above.  Physical Exam: Vitals:  02/17/16 0924  BP: 116/76  Pulse: 91  Weight: 181 lb 12.8 oz (82.5 kg)  Height: _0  (1.88 m)    GEN- The patient is elderly and fatigued appearing, alert and oriented x 3 today.   Head- normocephalic, atraumatic Eyes-  Sclera clear, conjunctiva pink Ears- hearing intact Oropharynx- clear Neck- supple  Lungs- Clear to ausculation bilaterally, normal work of breathing Heart- Irregular rate and rhythm  GI- soft, NT, ND, + BS Extremities- no clubbing, cyanosis, or edema MS- age appropriate atrophy Skin- no rash or lesion Psych- euthymic mood, full affect Neuro- strength and sensation are intact  Wt Readings from Last 3 Encounters:  02/17/16 181 lb 12.8 oz (82.5 kg)  02/04/16 188 lb 12.8 oz (85.6 kg)  01/27/16 188 lb 4 oz (85.4 kg)    EKG today demonstrates atrial fibrillation with LBBB Echo 11/2015 demonstrated EF 20-25%, diffuse hypokinesis, mild AS, moderate MR, LA severely dilated.   Epic records are reviewed at length today  Assessment and Plan:  1. Persistent atrial fibrillation Symptomatic and likely worsening heart failure symptoms.  He has been compliant with anticoagulation since last AHF visit but I do not think he can wait 3 weeks for DCCV with his symptoms.  Will arrange for TEE guided DCCV with Dr [name] who the patient knows well (pt requests that Dr [name] do procedure if possible).  I worry that our ability to keep him euvolemic is  decreased with current AF and loss of CRT pacing As this is his first episode of AF, despite LA enlargement, I think a trial of cardioversion off meds is reasonable. If he returns to AF, will need to consider AAD therapy. With kidneys, amio may be only option.  Continue Eliquis for CHADS2VASC of at least 7  2. Chronic systolic heart failure Medications had to be scaled back significantly last month with AKI He appears euvolemic today Continue current therapy I think he will do better in SR  3.  Moderate MR Can further evaluate at time of TEE  Follow up with AHF clinic after DCCV as scheduled Follow up with [facility] at [facility] in 4-6 weeks.   Chanetta Marshall, NP 02/17/2016 2:43 PM

## 2016-02-18 ENCOUNTER — Ambulatory Visit (INDEPENDENT_AMBULATORY_CARE_PROVIDER_SITE_OTHER): Payer: Medicare Other

## 2016-02-18 ENCOUNTER — Telehealth: Payer: Self-pay | Admitting: Cardiology

## 2016-02-18 DIAGNOSIS — Z95810 Presence of automatic (implantable) cardiac defibrillator: Secondary | ICD-10-CM

## 2016-02-18 DIAGNOSIS — I5042 Chronic combined systolic (congestive) and diastolic (congestive) heart failure: Secondary | ICD-10-CM

## 2016-02-18 NOTE — Telephone Encounter (Signed)
Spoke with pt and reminded pt of remote transmission that is due today. Pt verbalized understanding.   

## 2016-02-18 NOTE — Progress Notes (Signed)
EPIC Encounter for ICM Monitoring  Patient Name: Scott Parsons is a 82 y.o. male Date: 02/18/2016 Primary Care Physican: [name], MD Primary Cardiologist:[place] Electrophysiologist: Allred Dry Weight:unknown Bi-V Pacing: 69%(decreased to 86.5% from 12/09/2015)      Spoke with wife. Heart Failure questions reviewed, pt asymptomatic   Thoracic impedance normal since 02/13/2016 but was abnormal suggesting fluid accumulation from 9/16 to 10/6.  Recommendations: No changes.      Follow-up plan: ICM clinic phone appointment on 03/23/2016.  HF clinic appointment 03/09/2016.  Patient scheduled for TEE guided DCCV with Dr Gala Romney on 02/25/2016.    Copy of ICM check sent to primary cardiologist and device physician.   ICM trend: 02/18/2016      Karie Soda, RN 02/18/2016 1:51 PM

## 2016-02-19 ENCOUNTER — Telehealth: Payer: Self-pay

## 2016-02-19 NOTE — Telephone Encounter (Signed)
Returned call to patient regarding TEE/Cardioversion.   He was unsure if he should taking his morning medicines on the day of the Cardioversion procedure.   He has tried calling HF clinic but no return call.   Discussed instructions with Dr Jenel Lucks nurse and advised patient he may taking morning meds with a sip of water but do not take Furosemide/Diurectics or any diabetes med.  He verbalized understanding.

## 2016-02-25 ENCOUNTER — Encounter (HOSPITAL_COMMUNITY)
Admission: RE | Disposition: A | Discharge status: Home / Self Care | Payer: Self-pay | Source: Ambulatory Visit | Attending: Internal Medicine

## 2016-02-25 ENCOUNTER — Ambulatory Visit (HOSPITAL_BASED_OUTPATIENT_CLINIC_OR_DEPARTMENT_OTHER): Payer: Medicare Other

## 2016-02-25 ENCOUNTER — Ambulatory Visit (HOSPITAL_COMMUNITY)
Admission: RE | Admit: 2016-02-25 | Discharge: 2016-02-25 | Disposition: A | Discharge status: Home / Self Care | Payer: Medicare Other | Source: Ambulatory Visit | Attending: Internal Medicine | Admitting: Internal Medicine

## 2016-02-25 ENCOUNTER — Encounter (HOSPITAL_COMMUNITY): Payer: Self-pay | Admitting: *Deleted

## 2016-02-25 ENCOUNTER — Ambulatory Visit (HOSPITAL_COMMUNITY): Payer: Medicare Other | Admitting: Anesthesiology

## 2016-02-25 DIAGNOSIS — Z79899 Other long term (current) drug therapy: Secondary | ICD-10-CM | POA: Insufficient documentation

## 2016-02-25 DIAGNOSIS — N184 Chronic kidney disease, stage 4 (severe): Secondary | ICD-10-CM | POA: Diagnosis not present

## 2016-02-25 DIAGNOSIS — Z7982 Long term (current) use of aspirin: Secondary | ICD-10-CM | POA: Diagnosis not present

## 2016-02-25 DIAGNOSIS — Z95810 Presence of automatic (implantable) cardiac defibrillator: Secondary | ICD-10-CM | POA: Diagnosis not present

## 2016-02-25 DIAGNOSIS — Z95820 Peripheral vascular angioplasty status with implants and grafts: Secondary | ICD-10-CM | POA: Diagnosis not present

## 2016-02-25 DIAGNOSIS — I5022 Chronic systolic (congestive) heart failure: Secondary | ICD-10-CM | POA: Diagnosis not present

## 2016-02-25 DIAGNOSIS — Z8673 Personal history of transient ischemic attack (TIA), and cerebral infarction without residual deficits: Secondary | ICD-10-CM | POA: Diagnosis not present

## 2016-02-25 DIAGNOSIS — I4891 Unspecified atrial fibrillation: Secondary | ICD-10-CM

## 2016-02-25 DIAGNOSIS — I2510 Atherosclerotic heart disease of native coronary artery without angina pectoris: Secondary | ICD-10-CM | POA: Insufficient documentation

## 2016-02-25 DIAGNOSIS — Z7902 Long term (current) use of antithrombotics/antiplatelets: Secondary | ICD-10-CM | POA: Insufficient documentation

## 2016-02-25 DIAGNOSIS — G4733 Obstructive sleep apnea (adult) (pediatric): Secondary | ICD-10-CM | POA: Insufficient documentation

## 2016-02-25 DIAGNOSIS — I340 Nonrheumatic mitral (valve) insufficiency: Secondary | ICD-10-CM

## 2016-02-25 DIAGNOSIS — I428 Other cardiomyopathies: Secondary | ICD-10-CM | POA: Insufficient documentation

## 2016-02-25 DIAGNOSIS — E785 Hyperlipidemia, unspecified: Secondary | ICD-10-CM | POA: Diagnosis not present

## 2016-02-25 DIAGNOSIS — Z87891 Personal history of nicotine dependence: Secondary | ICD-10-CM | POA: Insufficient documentation

## 2016-02-25 DIAGNOSIS — R7303 Prediabetes: Secondary | ICD-10-CM | POA: Diagnosis not present

## 2016-02-25 DIAGNOSIS — I130 Hypertensive heart and chronic kidney disease with heart failure and stage 1 through stage 4 chronic kidney disease, or unspecified chronic kidney disease: Secondary | ICD-10-CM | POA: Insufficient documentation

## 2016-02-25 HISTORY — PX: CARDIOVERSION: SHX1299

## 2016-02-25 HISTORY — PX: TEE WITHOUT CARDIOVERSION: SHX5443

## 2016-02-25 SURGERY — ECHOCARDIOGRAM, TRANSESOPHAGEAL
Anesthesia: Monitor Anesthesia Care

## 2016-02-25 MED ORDER — PROPOFOL 500 MG/50ML IV EMUL
INTRAVENOUS | Status: DC | PRN
  Administered 2016-02-25: 50 ug/kg/min via INTRAVENOUS

## 2016-02-25 MED ORDER — LIDOCAINE HCL (CARDIAC) 20 MG/ML IV SOLN
INTRAVENOUS | Status: DC | PRN
  Administered 2016-02-25: 60 mg via INTRATRACHEAL

## 2016-02-25 MED ORDER — LIDOCAINE VISCOUS 2 % MT SOLN
OROMUCOSAL | Status: AC
  Filled 2016-02-25: qty 15

## 2016-02-25 MED ORDER — AMIODARONE HCL 200 MG PO TABS: 200.0000 mg | ORAL_TABLET | Freq: Two times a day (BID) | ORAL | 6 refills | Status: DC

## 2016-02-25 MED ORDER — LIDOCAINE VISCOUS 2 % MT SOLN
OROMUCOSAL | Status: DC | PRN
  Administered 2016-02-25: 20 mL via OROMUCOSAL

## 2016-02-25 MED ORDER — PROPOFOL 10 MG/ML IV BOLUS
INTRAVENOUS | Status: DC | PRN
  Administered 2016-02-25: 10 mg via INTRAVENOUS
  Administered 2016-02-25: 40 mg via INTRAVENOUS
  Administered 2016-02-25: 20 mg via INTRAVENOUS

## 2016-02-25 MED ORDER — SODIUM CHLORIDE 0.9 % IV SOLN
INTRAVENOUS | Status: DC
  Administered 2016-02-25: 10:00:00 via INTRAVENOUS
  Administered 2016-02-25: 500 mL via INTRAVENOUS

## 2016-02-25 NOTE — Anesthesia Postprocedure Evaluation (Signed)
Anesthesia Post Note  Patient: Marland Mcalpine  Procedure(s) Performed: Procedure(s) (LRB): TRANSESOPHAGEAL ECHOCARDIOGRAM (TEE) (N/A) CARDIOVERSION (N/A)  Patient location during evaluation: PACU Anesthesia Type: MAC Level of consciousness: awake and alert Pain management: pain level controlled Vital Signs Assessment: post-procedure vital signs reviewed and stable Respiratory status: spontaneous breathing, nonlabored ventilation, respiratory function stable and patient connected to nasal cannula oxygen Cardiovascular status: stable and blood pressure returned to baseline Anesthetic complications: no    Last Vitals:  Vitals:   02/25/16 1020 02/25/16 1030  BP: (!) 102/51 (!) 107/50  Pulse: 70 71  Resp: 18 (!) 21  Temp:      Last Pain:  Vitals:   02/25/16 0956  TempSrc: Oral                 Bonita Quin

## 2016-02-25 NOTE — Discharge Instructions (Signed)
Electrical Cardioversion, Care After °Refer to this sheet in the next few weeks. These instructions provide you with information on caring for yourself after your procedure. Your health care provider may also give you more specific instructions. Your treatment has been planned according to current medical practices, but problems sometimes occur. Call your health care provider if you have any problems or questions after your procedure. °WHAT TO EXPECT AFTER THE PROCEDURE °After your procedure, it is typical to have the following sensations: °· Some redness on the skin where the shocks were delivered. If this is tender, a sunburn lotion or hydrocortisone cream may help. °· Possible return of an abnormal heart rhythm within hours or days after the procedure. °HOME CARE INSTRUCTIONS °· Take medicines only as directed by your health care provider. Be sure you understand how and when to take your medicine. °· Learn how to feel your pulse and check it often. °· Limit your activity for 48 hours after the procedure or as directed by your health care provider. °· Avoid or minimize caffeine and other stimulants as directed by your health care provider. °SEEK MEDICAL CARE IF: °· You feel like your heart is beating too fast or your pulse is not regular. °· You have any questions about your medicines. °· You have bleeding that will not stop. °SEEK IMMEDIATE MEDICAL CARE IF: °· You are dizzy or feel faint. °· It is hard to breathe or you feel short of breath. °· There is a change in discomfort in your chest. °· Your speech is slurred or you have trouble moving an arm or leg on one side of your body. °· You get a serious muscle cramp that does not go away. °· Your fingers or toes turn cold or blue. °  °This information is not intended to replace advice given to you by your health care provider. Make sure you discuss any questions you have with your health care provider. °  °Document Released: 02/14/2013 Document Revised: 05/17/2014  Document Reviewed: 02/14/2013 °Elsevier Interactive Patient Education ©2016 [employer] °Transesophageal Echocardiogram °Transesophageal echocardiography (TEE) is a picture test of your heart using sound waves. The pictures taken can give very detailed pictures of your heart. This can help your doctor see if there are problems with your heart. TEE can check: °· If your heart has blood clots in it. °· How well your heart valves are working. °· If you have an infection on the inside of your heart. °· Some of the major arteries of your heart. °· If your heart valve is working after a repair. °· Your heart before a procedure that uses a shock to your heart to get the rhythm back to normal. °BEFORE THE PROCEDURE °· Do not eat or drink for 6 hours before the procedure or as told by your doctor. °· Make plans to have someone drive you home after the procedure. Do not drive yourself home. °· An IV tube will be put in your arm. °PROCEDURE °· You will be given a medicine to help you relax (sedative). It will be given through the IV tube. °· A numbing medicine will be sprayed or gargled in the back of your throat to help numb it. °· The tip of the probe is placed into the back of your mouth. You will be asked to swallow. This helps to pass the probe into your esophagus. °· Once the tip of the probe is in the right place, your doctor can take pictures of your heart. °· You   may feel pressure at the back of your throat. °AFTER THE PROCEDURE °· You will be taken to a recovery area so the sedative can wear off. °· Your throat may be sore and scratchy. This will go away slowly over time. °· You will go home when you are fully awake and able to swallow liquids. °· You should have someone stay with you for the next 24 hours. °· Do not drive or operate machinery for the next 24 hours. °  °This information is not intended to replace advice given to you by your health care provider. Make sure you discuss any questions you have with  your health care provider. °  °Document Released: 02/21/2009 Document Revised: 05/01/2013 Document Reviewed: 10/26/2012 °Elsevier Interactive Patient Education ©2016 [employer] ° °

## 2016-02-25 NOTE — Interval H&P Note (Signed)
History and Physical Interval Note:  02/25/2016 9:13 AM  Scott Parsons  has presented today for surgery, with the diagnosis of a fib  The various methods of treatment have been discussed with the patient and family. After consideration of risks, benefits and other options for treatment, the patient has consented to  Procedure(s): TRANSESOPHAGEAL ECHOCARDIOGRAM (TEE) (N/A) CARDIOVERSION (N/A) as a surgical intervention .  The patient's history has been reviewed, patient examined, no change in status, stable for surgery.  I have reviewed the patient's chart and labs.  Questions were answered to the patient's satisfaction.     [name], Reuel Boom

## 2016-02-25 NOTE — Progress Notes (Signed)
Echocardiogram Echocardiogram Transesophageal has been performed.  Scott Parsons 02/25/2016, 9:57 AM

## 2016-02-25 NOTE — Anesthesia Procedure Notes (Signed)
Procedure Name: MAC Date/Time: 02/25/2016 9:30 AM Performed by: Orvilla Fus A Pre-anesthesia Checklist: Patient identified, Emergency Drugs available, Suction available, Patient being monitored and Timeout performed Oxygen Delivery Method: Nasal cannula Placement Confirmation: positive ETCO2

## 2016-02-25 NOTE — Transfer of Care (Signed)
Immediate Anesthesia Transfer of Care Note  Patient: Scott Parsons  Procedure(s) Performed: Procedure(s): TRANSESOPHAGEAL ECHOCARDIOGRAM (TEE) (N/A) CARDIOVERSION (N/A)  Patient Location: Endoscopy Unit  Anesthesia Type:MAC  Level of Consciousness: sedated  Airway & Oxygen Therapy: Patient Spontanous Breathing and Patient connected to nasal cannula oxygen  Post-op Assessment: Report given to RN and Post -op Vital signs reviewed and stable  Post vital signs: Reviewed and stable  Last Vitals:  Vitals:   02/25/16 0749 02/25/16 0956  BP: (!) 122/52 (!) 85/42  Pulse: 75 73  Resp: 15 12  Temp: 36.6 C 36.5 C    Last Pain:  Vitals:   02/25/16 0956  TempSrc: Oral         Complications: No apparent anesthesia complications

## 2016-02-25 NOTE — Anesthesia Preprocedure Evaluation (Addendum)
Anesthesia Evaluation    Airway Mallampati: II  TM Distance: >3 FB Neck ROM: Full    Dental  (+) Edentulous Upper, Edentulous Lower   Pulmonary sleep apnea , former smoker,    Pulmonary exam normal        Cardiovascular hypertension, + Peripheral Vascular Disease and +CHF  + dysrhythmias Atrial Fibrillation + Cardiac Defibrillator  Rhythm:Irregular Rate:Normal  Left ventricle: ? some ventricular non compaction. The cavity   size was severely dilated. Wall thickness was normal. Systolic   function was severely reduced. The estimated ejection fraction   was in the range of 20% to 25%. Diffuse hypokinesis. Doppler   parameters are consistent with both elevated ventricular   end-diastolic filling pressure and elevated left atrial filling   pressure. - Aortic valve: There was mild stenosis. Valve area (VTI): 1.04   cm^2. Valve area (Vmax): 0.85 cm^2. Valve area (Vmean): 0.82   cm^2. - Mitral valve: There was moderate regurgitation. - Left atrium: The atrium was severely dilated. - Atrial septum: No defect or patent foramen ovale was identified. - Impressions: AS severity hard to judge due to poor LV function   but not severe.    Mid RCA lesion, 30% stenosed.  RPDA lesion, 30% stenosed.  Ost LM lesion, 20% stenosed.  Mid LAD lesion, 40% stenosed.   Neuro/Psych TIA   GI/Hepatic   Endo/Other    Renal/GU CRFRenal disease     Musculoskeletal   Abdominal   Peds  Hematology   Anesthesia Other Findings   Reproductive/Obstetrics                           Anesthesia Physical Anesthesia Plan  ASA: IV  Anesthesia Plan: MAC   Post-op Pain Management:    Induction: Intravenous  Airway Management Planned: Simple Face Mask  Additional Equipment:   Intra-op Plan:   Post-operative Plan:   Informed Consent: I have reviewed the patients History and Physical, chart, labs and discussed the  procedure including the risks, benefits and alternatives for the proposed anesthesia with the patient or authorized representative who has indicated his/her understanding and acceptance.     Plan Discussed with: CRNA  Anesthesia Plan Comments:         Anesthesia Quick Evaluation

## 2016-02-25 NOTE — CV Procedure (Signed)
    TRANSESOPHAGEAL ECHOCARDIOGRAM GUIDED DIRECT CURRENT CARDIOVERSION  NAME:  Scott Parsons   MRN: [id number] DOB:  11/02/1929   ADMIT DATE: 02/25/2016  INDICATIONS:  AF   PROCEDURE:   Informed consent was obtained prior to the procedure. The risks, benefits and alternatives for the procedure were discussed and the patient comprehended these risks.  Risks include, but are not limited to, cough, sore throat, vomiting, nausea, somnolence, esophageal and stomach trauma or perforation, bleeding, low blood pressure, aspiration, pneumonia, infection, trauma to the teeth and death.    After a procedural time-out, the patient was sedated by the anesthesia service using iv propofol.  The transesophageal probe was inserted in the esophagus and stomach without difficulty and multiple views were obtained.    FINDINGS:  LEFT VENTRICLE: Dilated with severe global HK, EF = 15-20%  RIGHT VENTRICLE: Mildly HK. + pacing wire  LEFT ATRIUM: Markedly dilated. 6.4 cm  LEFT ATRIAL APPENDAGE: No clot  RIGHT ATRIUM: Normal. + pacing wire  AORTIC VALVE:  Trileaflet. Mildly calcified. No AS/AI  MITRAL VALVE:    Poor coaptation. Severe  Central MR.  TRICUSPID VALVE: Normal Trivial TR  PULMONIC VALVE: Normal. No PR  INTERATRIAL SEPTUM: Bulges to right. No PFO/ASD  PERICARDIUM: No effusion  DESCENDING AORTA: Moderate plaque   CARDIOVERSION:     Procedure Details:  Once the TEE was complete, the patient had the defibrillator pads placed in the anterior and posterior position. The patient then underwent further sedation by the anesthesia service for cardioversion. Once an appropriate level of sedation was achieved, the patient received a single biphasic, synchronized 200J shock with prompt conversion to sinus rhythm. No apparent complications. NSR rhythm confirmed by ICD interrogation.   COMPLICATIONS:    There were no immediate complications.   CONCLUSION:   1.  Successful TEE guided  cardioversion. Given LA size and low EF very high risk for recurrence of AF. Start amio 200 bid. Continue Eliquis  [name],MD 10:07 AM

## 2016-02-25 NOTE — H&P (View-Only) (Signed)
Patient ID: Scott Parsons, male   DOB: 08/04/1929, 82 y.o.   MRN: [id number]    Advanced Heart Failure Clinic Note    Primary Care: Byron Hoffman, MD Primary Cardiologist: Dr Berry Primary HF: Dr. Zaide Kardell   HPI: Scott Parsons is a 82 y.o. male with history of NICM, Chronic systolic CHF (Echo 11/2015 EF 20-25%), LBBB s/p biV ICD, CKD III-IV, HTN, PAD, Carotid stenosis, and RAS s/p renal stenting.   He is followed closely in the [facility] CHF clinic by Kirkwood Adams. In 09/2015 was found to have worsening of renal fxn along with hyperkalemia. He was admitted to [facility] and his lisinopril, valsartan, and spironolactone were discontinued. Labs were stable upon f/u and he was subsequently placed on entresto.  Was taken to [facility] on 11/11/15 with acute worsening dyspnea. Required Bipap. Tx to [facility] for further eval. Was admitted by CCM for resp failure. Had significant symptom improvement with diuresis. Catheterization as below with mild non-obstructive CAD and well compensated hemodynamics after diuresis. Diuresed 2.2 L. Weight 190 on discharge.   Admitted 8/26-8/30 with flash edema. Diuresed with IV lasix. ICD adjusted to increase pacing. Out 10 lbs with IV lasix. Discharge weight was 180 lbs.   He presents today for follow up.  Last  Visit, Dr McLean started 60 mg lasix and 12.5 mg spiro daily. Mild dyspnea with exertion. Complaining of fatiuge. Denies PND/Orthopnea. No chest pain.  Working 2-3 hours in the yard. Appetite ok . Weight at home 184 pounds. Taking all medications.   Labs 01/27/2016: Creatinine 1.82 K 4.4  Labs 01/22/16: Creatinine 2.7 K 5.5  Labs 01/15/2016: Creatinine 2.69 K 5.6  Labs    R/LHC 11/13/15  Mild non-obstructive CAD, and well compensated hemodynamics after diuresis.  RA = 3 RV = 36/3 PA = 34/12 (20) PCW = 12 Ao = 118/48 (72) LV = 129/18 Fick cardiac output/index = 5.6/2.6 PVR = 1.4 WU SVR = 984 FA sat = 87% PA sat = 58%, 55%  Echo 11/13/15  LVEF 20-25% with  diffuse hypokinesis. Mild AS, Moderate MR, Severe LAE   Past Medical History:  Diagnosis Date  . Carotid artery disease (HCC)    a. 04/2015 mild to mod bilat ICA stenosis;  b. 10/2015 >70% LICA stenosis, no significant dzs on right.  . Chronic combined systolic and diastolic CHF, NYHA class 4 (HCC)    a. 01/2013 Echo: EF 25-30%; b. 08/2013 Echo: EF 15-20%; c. 04/2014 Echo: EF 20-25%, mild LVH, Gr1 DD, mild MR, mod dil LA.  . CKD (chronic kidney disease), stage IV (HCC)    a. baseline creat of ~ 1.8;  b. 09/2015 Admitted to [facility] with AKI and hyperkalemia.  . History of stroke   . Hyperlipidemia   . Hypertensive heart disease with CHF (congestive heart failure) (HCC)   . ICD (implantable cardioverter-defibrillator), dual, in situ    a. 09/2013 s/p MDT Viva Quad XT CRT-D (ser #[id number]).  . LBBB (left bundle branch block)   . Non-obstructive CAD    a. 11/2001 Cath ([facility]): LM nl, LAD 50m, LCX nondom, nl, RI large, nl, RCA dominant, 40m/d.  . Nonischemic cardiomyopathy (HCC)    a. 11/2001 nonobs cath; b. 09/2013 s/p MDT Viva Quad XT CRT-D (ser #[id number]); c. 04/2014 Echo: EF 20-25%;  d. Followed @ [facility] by K. Adams, MD.  . OSA (obstructive sleep apnea)    a. Does not tolerate/use CPAP.  . Pre-diabetes   . PVD (peripheral vascular disease) (HCC)      a. 10/2005 PTA: right external iliac artery angioplasty and left renal artery angioplasty and stenting;  b. 10/2015 ABI's in setting of recurrent claudication: R 0.97, L 0.77 (reduced by 0.12 from prior study).  . Renal artery stenosis (HCC)    a. 10/2005 s/p L renal artery stenting.  . TIA (transient ischemic attack) 06/2010    Current Outpatient Prescriptions  Medication Sig Dispense Refill  . allopurinol (ZYLOPRIM) 100 MG tablet Take 100 mg by mouth daily.    . aspirin EC 81 MG tablet Take 81 mg by mouth daily.    . atorvastatin (LIPITOR) 20 MG tablet Take 20 mg by mouth daily.    . Blood Glucose Monitoring Suppl (FIFTY50 GLUCOSE METER 2.0)  w/Device KIT     . clopidogrel (PLAVIX) 75 MG tablet Take 75 mg by mouth daily.    . Coenzyme Q10 (CO Q 10) 100 MG CAPS Take 100 mg by mouth daily.    . furosemide (LASIX) 40 MG tablet Take 1.5 tablets (60 mg total) by mouth daily. 90 tablet 3  . gabapentin (NEURONTIN) 300 MG capsule Take 300 mg by mouth at bedtime.    . isosorbide mononitrate (IMDUR) 30 MG 24 hr tablet Take 30 mg by mouth daily.    . Magnesium 500 MG CAPS Take 500 mg by mouth daily.    . metoprolol (TOPROL-XL) 200 MG 24 hr tablet Take 0.5 tablets (100 mg total) by mouth daily. 15 tablet 6  . niacin 500 MG tablet Take 500 mg by mouth daily with breakfast.    . OXYGEN Inhale 2 L into the lungs at bedtime.    . spironolactone (ALDACTONE) 25 MG tablet Take 0.5 tablets (12.5 mg total) by mouth daily. 45 tablet 3  . zolpidem (AMBIEN) 10 MG tablet Take 5 mg by mouth at bedtime.     . nitroGLYCERIN (NITROSTAT) 0.4 MG SL tablet Place 0.4 mg under the tongue every 5 (five) minutes as needed for chest pain (maximum 3 tablets).     No current facility-administered medications for this encounter.     Allergies  Allergen Reactions  . Amoxicillin Itching    Itching    . Biaxin [Clarithromycin] Itching    Lip swelling     . Erythromycin Itching  . Penicillins Hives and Itching    Has patient had a PCN reaction causing immediate rash, facial/tongue/throat swelling, SOB or lightheadedness with hypotension: Yes Has patient had a PCN reaction causing severe rash involving mucus membranes or skin necrosis: No Has patient had a PCN reaction that required hospitalization No Has patient had a PCN reaction occurring within the last 10 years: No If all of the above answers are "NO", then may proceed with Cephalosporin use.  . Shellfish Allergy Itching        . Sulfa Antibiotics Other (See Comments)    AKI and hyperkalemia      Social History   Social History  . Marital status: Married    Spouse name: N/A  . Number of children:  N/A  . Years of education: N/A   Occupational History  . Retired    Social History Main Topics  . Smoking status: Former Smoker    Years: 52.00    Types: Cigarettes    Quit date: 08/11/2004  . Smokeless tobacco: Never Used  . Alcohol use No  . Drug use: No  . Sexual activity: Yes   Other Topics Concern  . Not on file   Social History Narrative   Retired.    Lives in [place] with his wife.  Tries to remain active.      Family History  Problem Relation Age of Onset  . Alzheimer's disease Mother   . Heart disease Father   . Heart disease Paternal Grandfather   . Heart attack Paternal Grandfather     Vitals:   02/04/16 1442  BP: 118/62  Pulse: 98  SpO2: 99%  Weight: 188 lb 12.8 oz (85.6 kg)    Wt Readings from Last 3 Encounters:  02/04/16 188 lb 12.8 oz (85.6 kg)  01/27/16 188 lb 4 oz (85.4 kg)  01/22/16 186 lb (84.4 kg)     PHYSICAL EXAM: General: Elderly. No resp difficulty.  HEENT: normal  Neck: supple. JVP 8-9. Carotids 2+ bilat; no bruits. No thyromegaly or nodule noted.  Cor: PMI nondisplaced. Irr, Irr, 2/6 MR, TR Lungs: CTAB, normal effort Abdomen: soft, NT, ND, no HSM. No bruits or masses. +BS  Extremities: no cyanosis, clubbing, rash, trace edema Neuro: alert & orientedx3, cranial nerves grossly intact. moves all 4 extremities w/o difficulty. Affect pleasant  ASSESSMENT & PLAN:  1. Chronic systolic HF:  Echo 11/14/91 EF 20-25%.  Nonischemic cardiomyopathy.  [employer] CRT-D.   - NYHA III. Volume status ok but may trend up with A fib.  Continue Lasix at 60 mg daily (was taking 80 mg daily or 120 mg daily).  Continue spiro to 12.5 mg daily.   - BMET/BNP    - Continue current Toprol XL.  2. AKI on CKD: Recheck BMET today 3. NSVT and frequent PVCs: He remains on Toprol XL.  4. Carotid stenosis > 70% on L  5. CAD - Mild non obstructive on cath 11/2015 - Continue ASA 81 mg daily, Clopidigrel 75 mg daily, statin 6. A fib - New onset 9/23 deivice  interrogation confims. Stop aspirin and plavix. Start eliquis 2.5 mg tiwce a day. May need to add amio.   Follow up in 14 days with [facility] in the A fib clinic.   Follow up with Dr Aundra Dubin in 6 weeks.   Amy Clegg NP-C   02/04/2016 3:04 PM    Patient seen and examined with Darrick Grinder, NP. We discussed all aspects of the encounter. I agree with the assessment and plan as stated above.   Long talk with him about his situation. He has very tenuous HF with NYHA III and maybe even IIIB symptoms at times. Volume status trending up slowly. Now had new onset AF with loss of CRT by device interrogation.  Will start dose-adjusted Eliquis and plan DC-CY in 4-6 weeks. Echo reviewed and LA size > 5 cm so likelihood of maintaining NSR is low but will hold off on amio for now.   Total time spent 35 minutes. Over half that time spent discussing above.   Wagner Tanzi,MD 3:45 PM

## 2016-02-27 ENCOUNTER — Inpatient Hospital Stay: Admission: AD | Admit: 2016-02-27 | Payer: Self-pay | Source: Other Acute Inpatient Hospital | Admitting: Cardiology

## 2016-02-27 ENCOUNTER — Telehealth (HOSPITAL_COMMUNITY): Payer: Self-pay | Admitting: Vascular Surgery

## 2016-02-27 NOTE — Telephone Encounter (Signed)
Pt daughter called pt was in ER in [place] the doctor there wants the pt to f/u w. DB TODAY.[name] PLEASE ADVISE

## 2016-03-09 ENCOUNTER — Ambulatory Visit (HOSPITAL_COMMUNITY)
Admission: RE | Admit: 2016-03-09 | Discharge: 2016-03-09 | Disposition: A | Discharge status: Home / Self Care | Payer: Medicare Other | Source: Ambulatory Visit | Attending: Cardiology | Admitting: Cardiology

## 2016-03-09 ENCOUNTER — Encounter (HOSPITAL_COMMUNITY): Payer: Self-pay

## 2016-03-09 VITALS — BP 122/66 | HR 91 | Wt 179.8 lb

## 2016-03-09 DIAGNOSIS — I6523 Occlusion and stenosis of bilateral carotid arteries: Secondary | ICD-10-CM | POA: Diagnosis not present

## 2016-03-09 DIAGNOSIS — N179 Acute kidney failure, unspecified: Secondary | ICD-10-CM | POA: Insufficient documentation

## 2016-03-09 DIAGNOSIS — N184 Chronic kidney disease, stage 4 (severe): Secondary | ICD-10-CM | POA: Diagnosis not present

## 2016-03-09 DIAGNOSIS — Z7901 Long term (current) use of anticoagulants: Secondary | ICD-10-CM | POA: Insufficient documentation

## 2016-03-09 DIAGNOSIS — I2510 Atherosclerotic heart disease of native coronary artery without angina pectoris: Secondary | ICD-10-CM

## 2016-03-09 DIAGNOSIS — I429 Cardiomyopathy, unspecified: Secondary | ICD-10-CM | POA: Insufficient documentation

## 2016-03-09 DIAGNOSIS — I779 Disorder of arteries and arterioles, unspecified: Secondary | ICD-10-CM | POA: Diagnosis not present

## 2016-03-09 DIAGNOSIS — R7303 Prediabetes: Secondary | ICD-10-CM | POA: Insufficient documentation

## 2016-03-09 DIAGNOSIS — I5042 Chronic combined systolic (congestive) and diastolic (congestive) heart failure: Secondary | ICD-10-CM | POA: Diagnosis not present

## 2016-03-09 DIAGNOSIS — I4891 Unspecified atrial fibrillation: Secondary | ICD-10-CM | POA: Insufficient documentation

## 2016-03-09 DIAGNOSIS — I472 Ventricular tachycardia: Secondary | ICD-10-CM | POA: Diagnosis not present

## 2016-03-09 DIAGNOSIS — Z880 Allergy status to penicillin: Secondary | ICD-10-CM | POA: Diagnosis not present

## 2016-03-09 DIAGNOSIS — Z95810 Presence of automatic (implantable) cardiac defibrillator: Secondary | ICD-10-CM

## 2016-03-09 DIAGNOSIS — G4733 Obstructive sleep apnea (adult) (pediatric): Secondary | ICD-10-CM

## 2016-03-09 DIAGNOSIS — Z79899 Other long term (current) drug therapy: Secondary | ICD-10-CM | POA: Insufficient documentation

## 2016-03-09 DIAGNOSIS — I2583 Coronary atherosclerosis due to lipid rich plaque: Secondary | ICD-10-CM

## 2016-03-09 DIAGNOSIS — E785 Hyperlipidemia, unspecified: Secondary | ICD-10-CM | POA: Diagnosis not present

## 2016-03-09 DIAGNOSIS — I447 Left bundle-branch block, unspecified: Secondary | ICD-10-CM | POA: Diagnosis not present

## 2016-03-09 DIAGNOSIS — I10 Essential (primary) hypertension: Secondary | ICD-10-CM | POA: Diagnosis not present

## 2016-03-09 DIAGNOSIS — I480 Paroxysmal atrial fibrillation: Secondary | ICD-10-CM | POA: Diagnosis not present

## 2016-03-09 DIAGNOSIS — Z888 Allergy status to other drugs, medicaments and biological substances status: Secondary | ICD-10-CM | POA: Diagnosis not present

## 2016-03-09 DIAGNOSIS — R0602 Shortness of breath: Secondary | ICD-10-CM | POA: Diagnosis not present

## 2016-03-09 DIAGNOSIS — Z8673 Personal history of transient ischemic attack (TIA), and cerebral infarction without residual deficits: Secondary | ICD-10-CM | POA: Diagnosis not present

## 2016-03-09 DIAGNOSIS — I5022 Chronic systolic (congestive) heart failure: Secondary | ICD-10-CM | POA: Diagnosis present

## 2016-03-09 DIAGNOSIS — I739 Peripheral vascular disease, unspecified: Secondary | ICD-10-CM | POA: Diagnosis not present

## 2016-03-09 DIAGNOSIS — Z87891 Personal history of nicotine dependence: Secondary | ICD-10-CM | POA: Diagnosis not present

## 2016-03-09 LAB — CBC
HCT: 41.8 % (ref 39.0–52.0)
Hemoglobin: 13.9 g/dL (ref 13.0–17.0)
MCH: 32.9 pg (ref 26.0–34.0)
MCHC: 33.3 g/dL (ref 30.0–36.0)
MCV: 98.8 fL (ref 78.0–100.0)
PLATELETS: 143 10*3/uL — AB (ref 150–400)
RBC: 4.23 MIL/uL (ref 4.22–5.81)
RDW: 13.7 % (ref 11.5–15.5)
WBC: 5.9 10*3/uL (ref 4.0–10.5)

## 2016-03-09 LAB — BASIC METABOLIC PANEL
Anion gap: 7 (ref 5–15)
BUN: 38 mg/dL — AB (ref 6–20)
CALCIUM: 9.4 mg/dL (ref 8.9–10.3)
CO2: 31 mmol/L (ref 22–32)
CREATININE: 2.15 mg/dL — AB (ref 0.61–1.24)
Chloride: 100 mmol/L — ABNORMAL LOW (ref 101–111)
GFR calc Af Amer: 31 mL/min — ABNORMAL LOW (ref >60)
GFR, EST NON AFRICAN AMERICAN: 27 mL/min — AB (ref >60)
GLUCOSE: 130 mg/dL — AB (ref 65–99)
Potassium: 4.1 mmol/L (ref 3.5–5.1)
SODIUM: 138 mmol/L (ref 135–145)

## 2016-03-09 LAB — BRAIN NATRIURETIC PEPTIDE: B NATRIURETIC PEPTIDE 5: 713.4 pg/mL — AB (ref 0.0–100.0)

## 2016-03-09 NOTE — Patient Instructions (Signed)
Routine lab work today. Will call you with any ABNORMAL lab results.  Follow up in 6-8 weeks.

## 2016-03-09 NOTE — Progress Notes (Signed)
Advanced Heart Failure Medication Review by a Pharmacist  Does the patient  feel that his/her medications are working for him/her?  yes  Has the patient been experiencing any side effects to the medications prescribed?  no  Does the patient measure his/her own blood pressure or blood glucose at home?  yes   Does the patient have any problems obtaining medications due to transportation or finances?   no  Understanding of regimen: good Understanding of indications: good Potential of compliance: good Patient understands to avoid NSAIDs. Patient understands to avoid decongestants.  Issues to address at subsequent visits: None   Pharmacist comments:  Scott Parsons is a pleasant 82 yo M presenting without a medication list but with good recall of his regimen. He reports good compliance with his regimen and did not have any specific medication-related questions or concerns for me at this time.   [name] Bonnye Fava, PharmD, BCPS, CPP Clinical Pharmacist Pager: [phone] Phone: [phone] 03/09/2016 10:41 AM      Time with patient: 10 minutes Preparation and documentation time: 2 minutes Total time: 12 minutes

## 2016-03-09 NOTE — Progress Notes (Signed)
Patient ID: Scott Parsons, male   DOB: 02/11/1930, 82 y.o.   MRN: [id number]    Advanced Heart Failure Clinic Note    Primary Care: [name], MD Primary Cardiologist: Dr Gwenlyn Found Primary HF: Dr. [name]   HPI: [name] is a 82 y.o. male with history of NICM, Chronic systolic CHF (Echo 09/9561 EF 20-25%), LBBB s/p biV ICD, CKD III-IV, HTN, PAD, Carotid stenosis, and RAS s/p renal stenting.   He is followed closely in the [facility] CHF clinic by Carolynn Serve. In 09/2015 was found to have worsening of renal fxn along with hyperkalemia. He was admitted to [facility] and his lisinopril, valsartan, and spironolactone were discontinued. Labs were stable upon f/u and he was subsequently placed on entresto.  Was taken to [place] ED on 11/11/15 with acute worsening dyspnea. Required Bipap. Tx to [facility] for further eval. Was admitted by CCM for resp failure. Had significant symptom improvement with diuresis. Catheterization as below with mild non-obstructive CAD and well compensated hemodynamics after diuresis. Diuresed 2.2 L. Weight 190 on discharge.   Admitted 8/26-8/30 with flash edema. Diuresed with IV lasix. ICD adjusted to increase pacing. Out 10 lbs with IV lasix. Discharge weight was 180 lbs.   He presents today for regular follow up.  States he is feeling better each day.  Mild DOE with some exertion. He did have an episode of SOB the day after DCCV, was treated with IV lasix and sent home the following morning. Has felt fins since.  Weight at home 174-175 lbs, stable.  Hasn't worked in the yard much. Appetite improving. Taking all medications as directed. Peeing well.  Only eating ~ 1000 mg of sodium daily. Does occasionally get lightheadedness with rapid standing. Takes 5-10 seconds to recover.  Denies BRBPR/Melena.   Optivol - No AF since DCCV per ICD.  Fluid status stable. Thoracic impedence above threshold. Pt activity just > 1 hr a day.   Labs 01/27/2016: Creatinine 1.82 K 4.4  Labs 01/22/16:  Creatinine 2.7 K 5.5  Labs 01/15/2016: Creatinine 2.69 K 5.6  Labs    [facility] 11/13/15  Mild non-obstructive CAD, and well compensated hemodynamics after diuresis.  RA = 3 RV = 36/3 PA = 34/12 (20) PCW = 12 Ao = 118/48 (72) LV = 129/18 Fick cardiac output/index = 5.6/2.6 PVR = 1.4 WU SVR = 984 FA sat = 87% PA sat = 58%, 55%  Echo 11/13/15  LVEF 20-25% with diffuse hypokinesis. Mild AS, Moderate MR, Severe LAE   Past Medical History:  Diagnosis Date  . Carotid artery disease ([place])    a. 04/2015 mild to mod bilat ICA stenosis;  b. 12/7562 >33% LICA stenosis, no significant dzs on right.  . Chronic combined systolic and diastolic CHF, NYHA class 4 ([place])    a. 01/2013 Echo: EF 25-30%; b. 08/2013 Echo: EF 15-20%; c. 04/2014 Echo: EF 20-25%, mild LVH, Gr1 DD, mild MR, mod dil LA.  . CKD (chronic kidney disease), stage IV ([place])    a. baseline creat of ~ 1.8;  b. 09/2015 Admitted to [facility] with AKI and hyperkalemia.  [name] History of stroke   . Hyperlipidemia   . Hypertensive heart disease with CHF (congestive heart failure) ([place])   . ICD (implantable cardioverter-defibrillator), dual, in situ    a. 09/2013 s/p MDT Hillery Aldo XT CRT-D (ser #[id number]).  . LBBB (left bundle branch block)   . Non-obstructive CAD    a. 11/2001 Cath [facility]): LM nl, LAD 79m LCX nondom,  nl, RI large, nl, RCA dominant, 48md.  .Marland KitchenNonischemic cardiomyopathy ([place]    a. 11/2001 nonobs cath; b. 09/2013 s/p MDT VHillery AldoXT CRT-D (ser ##[id number]); c. 04/2014 Echo: EF 20-25%;  d. Followed @ [facility] by K. AAndree Elk MD.  . OSA (obstructive sleep apnea)    a. Does not tolerate/use CPAP.  . Pre-diabetes   . PVD [place]ipheral vascular disease) ([place]    a. 10/2005 PTA: right external iliac artery angioplasty and left renal artery angioplasty and stenting;  b. 10/2015 ABI's in setting of recurrent claudication: R 0.97, L 0.77 (reduced by 0.12 from prior study).  . [place]al artery stenosis [place]    a. 10/2005 s/p L renal artery stenting.  .[name] TIA (transient ischemic attack) 06/2010    Current Outpatient Prescriptions  Medication Sig Dispense Refill  . allopurinol (ZYLOPRIM) 100 MG tablet Take 100 mg by mouth daily.    .Marland Kitchenamiodarone (PACERONE) 200 MG tablet Take 1 tablet (200 mg total) by mouth 2 (two) times daily. 60 tablet 6  . apixaban (ELIQUIS) 2.5 MG TABS tablet Take 1 tablet (2.5 mg total) by mouth 2 (two) times daily. 60 tablet 3  . atorvastatin (LIPITOR) 20 MG tablet Take 20 mg by mouth daily.    . Blood Glucose Monitoring Suppl (FIFTY50 GLUCOSE METER 2.0) w/Device KIT     . Coenzyme Q10 (CO Q 10) 100 MG CAPS Take 100 mg by mouth daily.    . furosemide (LASIX) 40 MG tablet Take 1.5 tablets (60 mg total) by mouth daily. 90 tablet 3  . gabapentin (NEURONTIN) 300 MG capsule Take 300 mg by mouth at bedtime.    . isosorbide mononitrate (IMDUR) 30 MG 24 hr tablet Take 30 mg by mouth daily.    . Magnesium 500 MG CAPS Take 500 mg by mouth daily.    . metoprolol (TOPROL-XL) 200 MG 24 hr tablet Take 0.5 tablets (100 mg total) by mouth daily. 15 tablet 6  . niacin 500 MG tablet Take 500 mg by mouth daily with breakfast.    . OXYGEN Inhale 2 L into the lungs at bedtime.    .Marland Kitchenspironolactone (ALDACTONE) 25 MG tablet Take 0.5 tablets (12.5 mg total) by mouth daily. 45 tablet 3  . zolpidem (AMBIEN) 10 MG tablet Take 5 mg by mouth at bedtime.     . nitroGLYCERIN (NITROSTAT) 0.4 MG SL tablet Place 0.4 mg under the tongue every 5 (five) minutes as needed for chest pain (maximum 3 tablets).     No current facility-administered medications for this encounter.     Allergies  Allergen Reactions  . Amoxicillin Itching    Itching    . Biaxin [Clarithromycin] Itching    Lip swelling     . Erythromycin Itching  . Penicillins Hives and Itching    Has patient had a PCN reaction causing immediate rash, facial/tongue/throat swelling, SOB or lightheadedness with hypotension: Yes Has patient had a PCN reaction causing severe rash involving mucus  membranes or skin necrosis: No Has patient had a PCN reaction that required hospitalization No Has patient had a PCN reaction occurring within the last 10 years: No If all of the above answers are "NO", then may proceed with Cephalosporin use.  . Shellfish Allergy Itching        . Sulfa Antibiotics Other (See Comments)    AKI and hyperkalemia      Social History   Social History  . Marital status: Married    Spouse name: N/A  .  Number of children: N/A  . Years of education: N/A   Occupational History  . Retired    Social History Main Topics  . Smoking status: Former Smoker    Years: 52.00    Types: Cigarettes    Quit date: 08/11/2004  . Smokeless tobacco: Never Used  . Alcohol use No  . Drug use: No  . Sexual activity: Yes   Other Topics Concern  . Not on file   Social History Narrative   Retired.  Lives in [place] with his wife.  Tries to remain active.      Family History  Problem Relation Age of Onset  . Alzheimer's disease Mother   . Heart disease Father   . Heart disease Paternal Grandfather   . Heart attack Paternal Grandfather     Vitals:   03/09/16 1024  BP: 122/66  Pulse: 91  SpO2: 97%  Weight: 179 lb 12.8 oz (81.6 kg)    Wt Readings from Last 3 Encounters:  03/09/16 179 lb 12.8 oz (81.6 kg)  02/25/16 181 lb (82.1 kg)  02/17/16 181 lb 12.8 oz (82.5 kg)     PHYSICAL EXAM: General: Elderly. No resp difficulty.  HEENT: normal  Neck: supple. JVP 7-8. Carotids 2+ bilat; no bruits. No thyromegaly or nodule noted.  Cor: PMI nondisplaced. RRR, 2/6 MR, TR Lungs: Clear, normal effort Abdomen: soft, NT, ND, no HSM. No bruits or masses. +BS  Extremities: no cyanosis, clubbing, rash. No peripheral edema.  Neuro: alert & orientedx3, cranial nerves grossly intact. moves all 4 extremities w/o difficulty. Affect pleasant  ASSESSMENT & PLAN:  1. Chronic systolic HF:  Echo 11/07/25 EF 20-25%.  Nonischemic cardiomyopathy.  Medtronic CRT-D.   - NYHA  III. Volume status looks good on exam today.  It did dip down immediately after cardioversion which may explain his hospitalization in [facility].  - Continue lasix at 60 mg daily for now.  BMET/BNP today.  - Continue spiro 12.5 mg daily.   - Continue current Toprol XL.  2. AKI on CKD III - BMET today.  3. NSVT and frequent PVCs: He remains on Toprol XL.  4. Carotid stenosis > 70% on L  5. CAD - Mild non obstructive on cath 11/2015 - Now off ASA and plavix with Eliquis.  - Continue atorvastatin 20 mg daily.  6. Paroxysmal, A fib - New onset 9/23 deivice interrogation confims.   - s/p TEE/DCCV 02/25/16.  - No further afib since DCCV.  - Continue Eliquis 2.5 mg BID. Denies Bleeding. He is off ASA and plavix.  - Continue amio 200 mg BID.  - This patients CHA2DS2-VASc Score and unadjusted Ischemic Stroke Rate (% per year) is equal to 11.2 % stroke rate/year from a score of 7  Looks great. No further Afib.  Labs today. Follow up 6-8 weeks, sooner with symptoms.   Shirley Friar, PA-C  03/09/2016

## 2016-03-11 ENCOUNTER — Telehealth (HOSPITAL_COMMUNITY): Payer: Self-pay | Admitting: Pharmacist

## 2016-03-11 NOTE — Telephone Encounter (Signed)
Mr. Nothdurft stated that his furosemide medication bottles says to take 80 mg daily but he has been taking 60 mg daily. Based on last HF clinic note, he should be taking 60 mg daily so he should continue what he has been doing.   [name] Bonnye Fava, PharmD, BCPS, CPP Clinical Pharmacist Pager: [phone] Phone: [phone] 03/11/2016 4:42 PM

## 2016-03-15 ENCOUNTER — Encounter: Payer: Self-pay | Admitting: Internal Medicine

## 2016-03-19 ENCOUNTER — Encounter (HOSPITAL_COMMUNITY): Payer: Medicare Other

## 2016-03-23 ENCOUNTER — Ambulatory Visit (INDEPENDENT_AMBULATORY_CARE_PROVIDER_SITE_OTHER): Payer: Medicare Other

## 2016-03-23 DIAGNOSIS — Z95810 Presence of automatic (implantable) cardiac defibrillator: Secondary | ICD-10-CM | POA: Diagnosis not present

## 2016-03-23 DIAGNOSIS — I5042 Chronic combined systolic (congestive) and diastolic (congestive) heart failure: Secondary | ICD-10-CM | POA: Diagnosis not present

## 2016-03-23 NOTE — Progress Notes (Signed)
EPIC Encounter for ICM Monitoring  Patient Name: Scott Parsons is a 82 y.o. male Date: 03/23/2016 Primary Care Physican: [name], MD Primary Cardiologist:Berry/McLean Electrophysiologist: Allred Dry Weight: 177.6 lb  Bi-V Pacing:  96.9%       Heart Failure questions reviewed, pt asymptomatic.  He stated he is feeling the best he has felt in the last 6 months.    Thoracic impedance normal   Recommendations: No changes.  Discussed how to limit salt intake to 2000 mg during the holidays.  Encouraged to call for fluid symptoms.    Follow-up plan: ICM clinic phone appointment on 05/24/2016.  Office appt with HF clinic 04/20/2016 and Dr Johney Frame 04/22/2016  Copy of ICM check sent to device physician.   ICM trend: 03/23/2016       Karie Soda, RN 03/23/2016 9:27 AM

## 2016-04-20 ENCOUNTER — Ambulatory Visit (HOSPITAL_COMMUNITY)
Admission: RE | Admit: 2016-04-20 | Discharge: 2016-04-20 | Disposition: A | Discharge status: Home / Self Care | Payer: Medicare Other | Source: Ambulatory Visit | Attending: Cardiology | Admitting: Cardiology

## 2016-04-20 ENCOUNTER — Encounter (HOSPITAL_COMMUNITY): Payer: Self-pay

## 2016-04-20 VITALS — BP 110/74 | HR 72 | Wt 183.0 lb

## 2016-04-20 DIAGNOSIS — I447 Left bundle-branch block, unspecified: Secondary | ICD-10-CM | POA: Insufficient documentation

## 2016-04-20 DIAGNOSIS — I429 Cardiomyopathy, unspecified: Secondary | ICD-10-CM | POA: Insufficient documentation

## 2016-04-20 DIAGNOSIS — I779 Disorder of arteries and arterioles, unspecified: Secondary | ICD-10-CM | POA: Diagnosis not present

## 2016-04-20 DIAGNOSIS — K5900 Constipation, unspecified: Secondary | ICD-10-CM | POA: Diagnosis not present

## 2016-04-20 DIAGNOSIS — Z91013 Allergy to seafood: Secondary | ICD-10-CM | POA: Diagnosis not present

## 2016-04-20 DIAGNOSIS — N184 Chronic kidney disease, stage 4 (severe): Secondary | ICD-10-CM

## 2016-04-20 DIAGNOSIS — I10 Essential (primary) hypertension: Secondary | ICD-10-CM

## 2016-04-20 DIAGNOSIS — I739 Peripheral vascular disease, unspecified: Secondary | ICD-10-CM | POA: Insufficient documentation

## 2016-04-20 DIAGNOSIS — I4891 Unspecified atrial fibrillation: Secondary | ICD-10-CM | POA: Diagnosis not present

## 2016-04-20 DIAGNOSIS — I5042 Chronic combined systolic (congestive) and diastolic (congestive) heart failure: Secondary | ICD-10-CM

## 2016-04-20 DIAGNOSIS — Z888 Allergy status to other drugs, medicaments and biological substances status: Secondary | ICD-10-CM | POA: Insufficient documentation

## 2016-04-20 DIAGNOSIS — I2583 Coronary atherosclerosis due to lipid rich plaque: Secondary | ICD-10-CM

## 2016-04-20 DIAGNOSIS — Z79899 Other long term (current) drug therapy: Secondary | ICD-10-CM | POA: Diagnosis not present

## 2016-04-20 DIAGNOSIS — R7303 Prediabetes: Secondary | ICD-10-CM | POA: Diagnosis not present

## 2016-04-20 DIAGNOSIS — I6523 Occlusion and stenosis of bilateral carotid arteries: Secondary | ICD-10-CM | POA: Insufficient documentation

## 2016-04-20 DIAGNOSIS — I472 Ventricular tachycardia: Secondary | ICD-10-CM | POA: Insufficient documentation

## 2016-04-20 DIAGNOSIS — G4733 Obstructive sleep apnea (adult) (pediatric): Secondary | ICD-10-CM

## 2016-04-20 DIAGNOSIS — Z7901 Long term (current) use of anticoagulants: Secondary | ICD-10-CM | POA: Diagnosis not present

## 2016-04-20 DIAGNOSIS — Z95810 Presence of automatic (implantable) cardiac defibrillator: Secondary | ICD-10-CM | POA: Diagnosis not present

## 2016-04-20 DIAGNOSIS — Z87891 Personal history of nicotine dependence: Secondary | ICD-10-CM | POA: Insufficient documentation

## 2016-04-20 DIAGNOSIS — Z7902 Long term (current) use of antithrombotics/antiplatelets: Secondary | ICD-10-CM | POA: Diagnosis not present

## 2016-04-20 DIAGNOSIS — E785 Hyperlipidemia, unspecified: Secondary | ICD-10-CM | POA: Insufficient documentation

## 2016-04-20 DIAGNOSIS — Z880 Allergy status to penicillin: Secondary | ICD-10-CM | POA: Diagnosis not present

## 2016-04-20 DIAGNOSIS — Z8673 Personal history of transient ischemic attack (TIA), and cerebral infarction without residual deficits: Secondary | ICD-10-CM | POA: Diagnosis not present

## 2016-04-20 DIAGNOSIS — I480 Paroxysmal atrial fibrillation: Secondary | ICD-10-CM

## 2016-04-20 DIAGNOSIS — I2510 Atherosclerotic heart disease of native coronary artery without angina pectoris: Secondary | ICD-10-CM

## 2016-04-20 DIAGNOSIS — Z8249 Family history of ischemic heart disease and other diseases of the circulatory system: Secondary | ICD-10-CM | POA: Diagnosis not present

## 2016-04-20 LAB — BASIC METABOLIC PANEL
ANION GAP: 6 (ref 5–15)
BUN: 43 mg/dL — ABNORMAL HIGH (ref 6–20)
CHLORIDE: 100 mmol/L — AB (ref 101–111)
CO2: 32 mmol/L (ref 22–32)
Calcium: 9.1 mg/dL (ref 8.9–10.3)
Creatinine, Ser: 2.31 mg/dL — ABNORMAL HIGH (ref 0.61–1.24)
GFR calc non Af Amer: 25 mL/min — ABNORMAL LOW (ref >60)
GFR, EST AFRICAN AMERICAN: 28 mL/min — AB (ref >60)
Glucose, Bld: 142 mg/dL — ABNORMAL HIGH (ref 65–99)
Potassium: 4 mmol/L (ref 3.5–5.1)
Sodium: 138 mmol/L (ref 135–145)

## 2016-04-20 LAB — BRAIN NATRIURETIC PEPTIDE: B Natriuretic Peptide: 1061.4 pg/mL — ABNORMAL HIGH (ref 0.0–100.0)

## 2016-04-20 NOTE — Progress Notes (Signed)
Advanced Heart Failure Medication Review by a Pharmacist  Does the patient  feel that his/her medications are working for him/her?  yes  Has the patient been experiencing any side effects to the medications prescribed?  no  Does the patient measure his/her own blood pressure or blood glucose at home?  yes   Does the patient have any problems obtaining medications due to transportation or finances?   no  Understanding of regimen: good Understanding of indications: good Potential of compliance: good Patient understands to avoid NSAIDs. Patient understands to avoid decongestants.  Issues to address at subsequent visits: None   Pharmacist comments:  Mr. Lahaie is a pleasant 82 yo M presenting with a current medication list. He reports good compliance with his regimen but thought he was taking amiodarone 100 mg BID. I have verified with [facility] that he has been filling 200 mg BID. No other medication-related questions or concerns for me at this time.   [name] Bonnye Fava, PharmD, BCPS, CPP Clinical Pharmacist Pager: [phone] Phone: [phone] 04/20/2016 10:38 AM      Time with patient: 10 minutes Preparation and documentation time: 2 minutes Total time: 12 minutes

## 2016-04-20 NOTE — Patient Instructions (Addendum)
Routine lab work today. Will notify you of abnormal results, otherwise no news is good news!  Take EXTRA Lasix for 2 DAYS (1.5 tabs twice daily), then resume as normal (1.5 tabs once daily).  Follow up 4 weeks with Otilio Saber PA-C.  Do the following things EVERYDAY: 1) Weigh yourself in the morning before breakfast. Write it down and keep it in a log. 2) Take your medicines as prescribed 3) Eat low salt foods-Limit salt (sodium) to 2000 mg per day.  4) Stay as active as you can everyday 5) Limit all fluids for the day to less than 2 liters

## 2016-04-20 NOTE — Progress Notes (Signed)
Patient ID: Scott Parsons, male   DOB: 1929-09-24, 82 y.o.   MRN: [id number]    Advanced Heart Failure Clinic Note    Primary Care: [name], MD Primary Cardiologist: Dr Gwenlyn Found Primary HF: Dr. [name]   HPI: Scott Parsons is a 82 y.o. male with history of NICM, Chronic systolic CHF (Echo 07/6642 EF 20-25%), LBBB s/p biV ICD, CKD III-IV, HTN, PAD, Carotid stenosis, and RAS s/p renal stenting.   He is followed closely in the [facility] CHF clinic by Carolynn Serve. In 09/2015 was found to have worsening of renal fxn along with hyperkalemia. He was admitted to [facility] and his lisinopril, valsartan, and spironolactone were discontinued. Labs were stable upon f/u and he was subsequently placed on entresto.  Was taken to [place] ED on 11/11/15 with acute worsening dyspnea. Required Bipap. Tx to [facility] for further eval. Was admitted by CCM for resp failure. Had significant symptom improvement with diuresis. Catheterization as below with mild non-obstructive CAD and well compensated hemodynamics after diuresis. Diuresed 2.2 L. Weight 190 on discharge.   Admitted 8/26-8/30 with flash edema. Diuresed with IV lasix. ICD adjusted to increase pacing. Out 10 lbs with IV lasix. Discharge weight was 180 lbs.   He presents today for regular follow up. Has been feeling good since last visit. Up 4 lbs from last visit. States he is constipated. Hasn't tried any meds. Hasn't tired any stool softeners. Is eating a lot of strawberrys.  Weight at home 180 lbs this am.  (prior to a large BM, despite above). Baseline weight around 178-180 range. Very occasional lightheadedness or dizziness. Watching fluid and salt. Can walk about 300 yards on flat ground without SOB. Can only walk about 300 feet uphill. Takes an extra lasix 1-2 times a month.   Optivol - No Afib since cardioversion. Fluid index with mild uptrend since thanksgiving.  Thoracic impedence below threshold.  Pt activity 1-2 hours daily. No VT/VF  Labs 01/27/2016:  Creatinine 1.82 K 4.4  Labs 01/22/16: Creatinine 2.7 K 5.5  Labs 01/15/2016: Creatinine 2.69 K 5.6  Labs    [facility] 11/13/15  Mild non-obstructive CAD, and well compensated hemodynamics after diuresis.  RA = 3 RV = 36/3 PA = 34/12 (20) PCW = 12 Ao = 118/48 (72) LV = 129/18 Fick cardiac output/index = 5.6/2.6 PVR = 1.4 WU SVR = 984 FA sat = 87% PA sat = 58%, 55%  Echo 11/13/15  LVEF 20-25% with diffuse hypokinesis. Mild AS, Moderate MR, Severe LAE   Past Medical History:  Diagnosis Date  . Carotid artery disease ([place])    a. 04/2015 mild to mod bilat ICA stenosis;  b. 0/3474 >25% LICA stenosis, no significant dzs on right.  . Chronic combined systolic and diastolic CHF, NYHA class 4 ([place])    a. 01/2013 Echo: EF 25-30%; b. 08/2013 Echo: EF 15-20%; c. 04/2014 Echo: EF 20-25%, mild LVH, Gr1 DD, mild MR, mod dil LA.  . CKD (chronic kidney disease), stage IV ([place])    a. baseline creat of ~ 1.8;  b. 09/2015 Admitted to [facility] with AKI and hyperkalemia.  [name] History of stroke   . Hyperlipidemia   . Hypertensive heart disease with CHF (congestive heart failure) ([place])   . ICD (implantable cardioverter-defibrillator), dual, in situ    a. 09/2013 s/p MDT Hillery Aldo XT CRT-D (ser #[id number]).  . LBBB (left bundle branch block)   . Non-obstructive CAD    a. 11/2001 Cath [facility]): LM nl, LAD 70m LCX  nondom, nl, RI large, nl, RCA dominant, 76md.  .Marland KitchenNonischemic cardiomyopathy ([place]    a. 11/2001 nonobs cath; b. 09/2013 s/p MDT VHillery AldoXT CRT-D (ser ##[id number]); c. 04/2014 Echo: EF 20-25%;  d. Followed @ [facility] by K. AAndree Elk MD.  . OSA (obstructive sleep apnea)    a. Does not tolerate/use CPAP.  . Pre-diabetes   . PVD (peripheral vascular disease) ([place]    a. 10/2005 PTA: right external iliac artery angioplasty and left renal artery angioplasty and stenting;  b. 10/2015 ABI's in setting of recurrent claudication: R 0.97, L 0.77 (reduced by 0.12 from prior study).  . Renal artery stenosis ([place]    a.  10/2005 s/p L renal artery stenting.  .Marland KitchenTIA (transient ischemic attack) 06/2010    Current Outpatient Prescriptions  Medication Sig Dispense Refill  . allopurinol (ZYLOPRIM) 100 MG tablet Take 100 mg by mouth daily.    .Marland Kitchenamiodarone (PACERONE) 200 MG tablet Take 1 tablet (200 mg total) by mouth 2 (two) times daily. 60 tablet 6  . apixaban (ELIQUIS) 2.5 MG TABS tablet Take 1 tablet (2.5 mg total) by mouth 2 (two) times daily. 60 tablet 3  . atorvastatin (LIPITOR) 20 MG tablet Take 20 mg by mouth daily.    . Blood Glucose Monitoring Suppl (FIFTY50 GLUCOSE METER 2.0) w/Device KIT     . Coenzyme Q10 (CO Q 10) 100 MG CAPS Take 100 mg by mouth daily.    . furosemide (LASIX) 40 MG tablet Take 1.5 tablets (60 mg total) by mouth daily. 90 tablet 3  . gabapentin (NEURONTIN) 300 MG capsule Take 300 mg by mouth at bedtime.    . isosorbide mononitrate (IMDUR) 30 MG 24 hr tablet Take 30 mg by mouth daily.    . Magnesium 500 MG CAPS Take 500 mg by mouth daily.    . metoprolol (TOPROL-XL) 200 MG 24 hr tablet Take 0.5 tablets (100 mg total) by mouth daily. 15 tablet 6  . niacin 500 MG tablet Take 500 mg by mouth daily with breakfast.    . OXYGEN Inhale 2 L into the lungs at bedtime.    .Marland Kitchenspironolactone (ALDACTONE) 25 MG tablet Take 0.5 tablets (12.5 mg total) by mouth daily. 45 tablet 3  . zolpidem (AMBIEN) 10 MG tablet Take 5 mg by mouth at bedtime.     . nitroGLYCERIN (NITROSTAT) 0.4 MG SL tablet Place 0.4 mg under the tongue every 5 (five) minutes as needed for chest pain (maximum 3 tablets).     No current facility-administered medications for this encounter.     Allergies  Allergen Reactions  . Amoxicillin Itching    Itching    . Biaxin [Clarithromycin] Itching    Lip swelling     . Erythromycin Itching  . Penicillins Hives and Itching    Has patient had a PCN reaction causing immediate rash, facial/tongue/throat swelling, SOB or lightheadedness with hypotension: Yes Has patient had a PCN  reaction causing severe rash involving mucus membranes or skin necrosis: No Has patient had a PCN reaction that required hospitalization No Has patient had a PCN reaction occurring within the last 10 years: No If all of the above answers are "NO", then may proceed with Cephalosporin use.  . Shellfish Allergy Itching        . Sulfa Antibiotics Other (See Comments)    AKI and hyperkalemia      Social History   Social History  . Marital status: Married    Spouse name: N/A  .  Number of children: N/A  . Years of education: N/A   Occupational History  . Retired    Social History Main Topics  . Smoking status: Former Smoker    Years: 52.00    Types: Cigarettes    Quit date: 08/11/2004  . Smokeless tobacco: Never Used  . Alcohol use No  . Drug use: No  . Sexual activity: Yes   Other Topics Concern  . Not on file   Social History Narrative   Retired.  Lives in [place] with his wife.  Tries to remain active.      Family History  Problem Relation Age of Onset  . Alzheimer's disease Mother   . Heart disease Father   . Heart disease Paternal Grandfather   . Heart attack Paternal Grandfather     Vitals:   04/20/16 1031  BP: 110/74  Pulse: 72  SpO2: 98%  Weight: 183 lb (83 kg)    Wt Readings from Last 3 Encounters:  04/20/16 183 lb (83 kg)  03/09/16 179 lb 12.8 oz (81.6 kg)  02/25/16 181 lb (82.1 kg)     PHYSICAL EXAM:  General: Elderly. Slightly fatigued appearing.  HEENT: Normal  Neck: supple. JVP 8-9 with mild HJR. Carotids 2+ bilat; no bruits. No thyromegaly or nodule noted.  Cor: PMI nondisplaced. RRR, 2/6 MR, TR Lungs: CTAB, normal effort.  Abdomen: soft, NT, ND, no HSM. No bruits or masses. +BS  Extremities: no cyanosis, clubbing, rash. Trace ankle edema. Neuro: alert & orientedx3, cranial nerves grossly intact. moves all 4 extremities w/o difficulty. Affect pleasant  ASSESSMENT & PLAN:  1. Chronic systolic HF:  Echo 08/16/23 EF 20-25%.  Nonischemic  cardiomyopathy.  Medtronic CRT-D.   - NYHA III symptoms.  - Volume status mildly elevated by exam and optivol  - Continue lasix at 60 mg daily for now. Will have him take BID x 2 days. BMET/BNP today.  - Continue spiro 12.5 mg daily.   - Continue current Toprol XL.  - Reinforced fluid restriction to < 2 L daily, sodium restriction to less than 2000 mg daily, and the importance of daily weights.   2. CKD III -IV - Labs today.   3. NSVT and frequent PVCs: He remains on Toprol XL.  4. Carotid stenosis > 70% on L  5. CAD - Mild non obstructive on cath 11/2015 - Now off ASA and plavix with Eliquis.  - Continue atorvastatin 20 mg daily.  6. Paroxysmal, A fib - New onset 9/23 deivice interrogation confims.   - s/p TEE/DCCV 02/25/16.  - No further afib since DCCV as of 04/20/16. Feels much better since DCCV.  - Continue Eliquis 2.5 mg BID. Denies Bleeding. He is off ASA and plavix.  - Continue amio 200 mg BID.  - This patients CHA2DS2-VASc Score and unadjusted Ischemic Stroke Rate (% per year) is equal to 11.2 % stroke rate/year from a score of 7  Mildly volume overloaded on exam.  Appetite much improved from previous per patient.  Will have him take 60 mg lasix BID x 2 days and repeat as needed for weight gain.     Follow up 4 weeks to keep close eye on fluid status over holidays.  Knows to call with any worsening symptoms/weight gain.   Shirley Friar, PA-C  04/20/2016   Total time spent > 25 minutes. Over half that spent discussing the above.

## 2016-04-21 ENCOUNTER — Telehealth (HOSPITAL_COMMUNITY): Payer: Self-pay | Admitting: Cardiology

## 2016-04-21 DIAGNOSIS — I509 Heart failure, unspecified: Secondary | ICD-10-CM

## 2016-04-21 NOTE — Telephone Encounter (Signed)
-----   Message from Graciella Freer, PA-C sent at 04/20/2016 12:14 PM EST ----- Overloaded on exam.  Had him take extra lasix.    Please recheck BMET next week.    Thanks!   Casimiro Needle [address]" [place], PA-C 04/20/2016 12:14 PM

## 2016-04-21 NOTE — Telephone Encounter (Signed)
Pt aware. Voiced understanding, repeat labs 12/20

## 2016-04-22 ENCOUNTER — Encounter (INDEPENDENT_AMBULATORY_CARE_PROVIDER_SITE_OTHER): Payer: Self-pay

## 2016-04-22 ENCOUNTER — Encounter: Payer: Self-pay | Admitting: Internal Medicine

## 2016-04-22 ENCOUNTER — Ambulatory Visit (INDEPENDENT_AMBULATORY_CARE_PROVIDER_SITE_OTHER): Payer: Medicare Other | Admitting: Internal Medicine

## 2016-04-22 VITALS — BP 92/50 | HR 87 | Ht 74.0 in | Wt 183.0 lb

## 2016-04-22 DIAGNOSIS — I5022 Chronic systolic (congestive) heart failure: Secondary | ICD-10-CM

## 2016-04-22 DIAGNOSIS — I480 Paroxysmal atrial fibrillation: Secondary | ICD-10-CM

## 2016-04-22 LAB — HEPATIC FUNCTION PANEL
ALBUMIN: 3.9 g/dL (ref 3.6–5.1)
ALT: 16 U/L (ref 9–46)
AST: 14 U/L (ref 10–35)
Alkaline Phosphatase: 110 U/L (ref 40–115)
BILIRUBIN DIRECT: 0.2 mg/dL (ref <=0.2)
BILIRUBIN TOTAL: 0.9 mg/dL (ref 0.2–1.2)
Indirect Bilirubin: 0.7 mg/dL (ref 0.2–1.2)
TOTAL PROTEIN: 5.9 g/dL — AB (ref 6.1–8.1)

## 2016-04-22 LAB — TSH: TSH: 3.05 m[IU]/L (ref 0.40–4.50)

## 2016-04-22 MED ORDER — AMIODARONE HCL 200 MG PO TABS: 200.0000 mg | ORAL_TABLET | Freq: Every day | ORAL | 3 refills | Status: DC

## 2016-04-22 NOTE — Patient Instructions (Addendum)
Medication Instructions:  Your physician has recommended you make the following change in your medication:  1) Decrease Amiodarone to 200 mg ONCE daily   Labwork: Your physician recommends that you return for lab work today: TSH, LIVER   Testing/Procedures: None ordered   Follow-Up: Your physician wants you to follow-up in: 6 months with Scott Balsam, NP.  You will receive a reminder letter in the mail two months in advance. If you don't receive a letter, please call our office to schedule the follow-up appointment.  Remote monitoring is used to monitor your ICD from home. This monitoring reduces the number of office visits required to check your device to one time per year. It allows [place] to keep an eye on the functioning of your device to ensure it is working properly. You are scheduled for a device check from home on 07/22/16. You may send your transmission at any time that day. If you have a wireless device, the transmission will be sent automatically. After your physician reviews your transmission, you will receive a postcard with your next transmission date.    Any Other Special Instructions Will Be Listed Below (If Applicable).     If you need a refill on your cardiac medications before your next appointment, please call your pharmacy.

## 2016-04-22 NOTE — Progress Notes (Signed)
PCP: [name], MD Primary Cardiologist:  Dr Gwenlyn Found Also followed in CHF clinic  Scott Parsons is a 82 y.o. male who presents today for routine electrophysiology followup.  The patient reports doing very well.  He has done much better since cardioversion.  He is maintaining sinus and feels good. Today, he denies symptoms of palpitations, chest pain,  lower extremity edema, dizziness, presyncope, syncope, or ICD shocks.  The patient is otherwise without complaint today.   Past Medical History:  Diagnosis Date  . Carotid artery disease ([place])    a. 04/2015 mild to mod bilat ICA stenosis;  b. 0/1601 >09% LICA stenosis, no significant dzs on right.  . Chronic combined systolic and diastolic CHF, NYHA class 4 ([place])    a. 01/2013 Echo: EF 25-30%; b. 08/2013 Echo: EF 15-20%; c. 04/2014 Echo: EF 20-25%, mild LVH, Gr1 DD, mild MR, mod dil LA.  . CKD (chronic kidney disease), stage IV ([place])    a. baseline creat of ~ 1.8;  b. 09/2015 Admitted to [facility] with AKI and hyperkalemia.  [name] History of stroke   . Hyperlipidemia   . Hypertensive heart disease with CHF (congestive heart failure) ([place])   . ICD (implantable cardioverter-defibrillator), dual, in situ    a. 09/2013 s/p MDT Hillery Aldo XT CRT-D (ser #[id number]).  . LBBB (left bundle branch block)   . Non-obstructive CAD    a. 11/2001 Cath [facility]): LM nl, LAD 73m LCX nondom, nl, RI large, nl, RCA dominant, 421m.  . Marland Kitchenonischemic cardiomyopathy ([place]   a. 11/2001 nonobs cath; b. 09/2013 s/p MDT ViHillery AldoT CRT-D (ser #[id number]); c. 04/2014 Echo: EF 20-25%;  d. Followed @ [facility] by K. AdAndree ElkMD.  . OSA (obstructive sleep apnea)    a. Does not tolerate/use CPAP.  . Pre-diabetes   . PVD (peripheral vascular disease) ([place]   a. 10/2005 PTA: right external iliac artery angioplasty and left renal artery angioplasty and stenting;  b. 10/2015 ABI's in setting of recurrent claudication: R 0.97, L 0.77 (reduced by 0.12 from prior study).  . Renal artery  stenosis ([place]   a. 10/2005 s/p L renal artery stenting.  . Marland KitchenIA (transient ischemic attack) 06/2010   Past Surgical History:  Procedure Laterality Date  . BI-VENTRICULAR IMPLANTABLE CARDIOVERTER DEFIBRILLATOR N/A 09/13/2013   Procedure: BI-VENTRICULAR IMPLANTABLE CARDIOVERTER DEFIBRILLATOR  (CRT-D);  Surgeon: JaCoralyn MarkMD;  [facility];  Service: Cardiovascular;  Laterality: N/A;  . BI-VENTRICULAR IMPLANTABLE CARDIOVERTER DEFIBRILLATOR  (CRT-D)  09-13-2013   MDT CRTD implanted by Dr AlRayann Heman. CARDIAC CATHETERIZATION  2003   Mild LV dysfuntion. he had apical and inferoapical wall motion abnormalities and EF of 45%.  . Marland KitchenARDIAC CATHETERIZATION  10/18/2005   STENTS: left renal artery as well as both iliac arteries. Was stented with 1290m 4mm87mart Nitinol self-expanding stent replaced with a 9mm 44mmm b16moon at nominal pressures, resulting in a reduction od 50% proximal right external iliac artery  stenosis to 0% residual.  . CARDIAC CATHETERIZATION N/A 11/13/2015   Procedure: Right/Left Heart Cath and Coronary Angiography;  Surgeon: DanielJolaine Artist [facility];  Service: Cardiovascular;  Laterality: N/A;  . CARDIOVERSION N/A 02/25/2016   Procedure: CARDIOVERSION;  Surgeon: DanielJolaine Artist [facility];  Service: Cardiovascular;  Laterality: N/A;  . HAND SURGERY  01/1995  . HERNIA REPAIR    . TEE WITHOUT CARDIOVERSION N/A 02/25/2016   Procedure: TRANSESOPHAGEAL ECHOCARDIOGRAM (  TEE);  Surgeon: Jolaine Artist, MD;  Location: [facility] ENDOSCOPY;  Service: Cardiovascular;  Laterality: N/A;    ROS- all systems are reviewed and negative except as per HPI above  Current Outpatient Prescriptions  Medication Sig Dispense Refill  . allopurinol (ZYLOPRIM) 100 MG tablet Take 100 mg by mouth daily.    [name] amiodarone (PACERONE) 200 MG tablet Take 1 tablet (200 mg total) by mouth 2 (two) times daily. 60 tablet 6  . apixaban (ELIQUIS) 2.5 MG TABS tablet  Take 1 tablet (2.5 mg total) by mouth 2 (two) times daily. 60 tablet 3  . atorvastatin (LIPITOR) 20 MG tablet Take 20 mg by mouth daily.    . Blood Glucose Monitoring Suppl (FIFTY50 GLUCOSE METER 2.0) w/Device KIT     . Coenzyme Q10 (CO Q 10) 100 MG CAPS Take 100 mg by mouth daily.    . furosemide (LASIX) 40 MG tablet Take 1.5 tablets (60 mg total) by mouth daily. 90 tablet 3  . gabapentin (NEURONTIN) 300 MG capsule Take 300 mg by mouth at bedtime.    . isosorbide mononitrate (IMDUR) 30 MG 24 hr tablet Take 30 mg by mouth daily.    . Magnesium 500 MG CAPS Take 500 mg by mouth daily.    . metoprolol (TOPROL-XL) 200 MG 24 hr tablet Take 0.5 tablets (100 mg total) by mouth daily. 15 tablet 6  . niacin 500 MG tablet Take 500 mg by mouth daily with breakfast.    . nitroGLYCERIN (NITROSTAT) 0.4 MG SL tablet Place 0.4 mg under the tongue every 5 (five) minutes as needed for chest pain (maximum 3 tablets).    . OXYGEN Inhale 2 L into the lungs at bedtime.    [name] spironolactone (ALDACTONE) 25 MG tablet Take 0.5 tablets (12.5 mg total) by mouth daily. 45 tablet 3  . zolpidem (AMBIEN) 10 MG tablet Take 5 mg by mouth at bedtime.      No current facility-administered medications for this visit.     Physical Exam: Vitals:   04/22/16 1224  BP: (!) 92/50  Pulse: 87  Weight: 183 lb (83 kg)  Height: '6\' 2"'$  (1.88 m)    GEN- The patient is well appearing, alert and oriented x 3 today.   Head- normocephalic, atraumatic Eyes-  Sclera clear, conjunctiva pink Ears- hearing intact Oropharynx- clear Lungs- Clear to ausculation bilaterally, normal work of breathing Chest- ICD pocket is well healed Heart- Regular rate and rhythm, no murmurs, rubs or gallops, PMI not laterally displaced GI- soft, NT, ND, + BS Extremities- no clubbing, cyanosis, or edema  ICD interrogation- reviewed in detail today,  See PACEART report  Assessment and Plan:  1.  Chronic systolic dysfunction euvolemic today Stable on an  appropriate medical regimen Normal ICD function See Pace Art report No changes today  2. Persistent afib Maintaining sinus rhythm with amiodarone Reduce amiodarone to '200mg'$  daily today Check LFTs/TFTs today  Sharman Cheek is following in [facility] clinic carelink EP NP to see in 6 months  [name] MD, [facility] 04/22/2016 12:47 PM

## 2016-04-26 ENCOUNTER — Other Ambulatory Visit: Payer: Self-pay | Admitting: Internal Medicine

## 2016-04-28 ENCOUNTER — Ambulatory Visit (HOSPITAL_COMMUNITY)
Admission: RE | Admit: 2016-04-28 | Discharge: 2016-04-28 | Disposition: A | Discharge status: Home / Self Care | Payer: Medicare Other | Source: Ambulatory Visit | Attending: Internal Medicine | Admitting: Internal Medicine

## 2016-04-28 DIAGNOSIS — I509 Heart failure, unspecified: Secondary | ICD-10-CM | POA: Diagnosis not present

## 2016-04-28 LAB — BASIC METABOLIC PANEL
ANION GAP: 5 (ref 5–15)
BUN: 45 mg/dL — ABNORMAL HIGH (ref 6–20)
CHLORIDE: 100 mmol/L — AB (ref 101–111)
CO2: 34 mmol/L — AB (ref 22–32)
CREATININE: 2.46 mg/dL — AB (ref 0.61–1.24)
Calcium: 9.5 mg/dL (ref 8.9–10.3)
GFR calc non Af Amer: 23 mL/min — ABNORMAL LOW (ref >60)
GFR, EST AFRICAN AMERICAN: 26 mL/min — AB (ref >60)
Glucose, Bld: 106 mg/dL — ABNORMAL HIGH (ref 65–99)
POTASSIUM: 4.1 mmol/L (ref 3.5–5.1)
SODIUM: 139 mmol/L (ref 135–145)

## 2016-04-29 ENCOUNTER — Telehealth: Payer: Self-pay | Admitting: *Deleted

## 2016-04-29 NOTE — Telephone Encounter (Signed)
-----   Message from Hillis Range, MD sent at 04/26/2016 10:01 PM EST ----- Results reviewed.  Tresa Endo, please inform pt of result. I will route to primary care also.

## 2016-04-29 NOTE — Telephone Encounter (Signed)
Patient informed. 

## 2016-05-07 ENCOUNTER — Ambulatory Visit (HOSPITAL_COMMUNITY)
Admission: RE | Admit: 2016-05-07 | Discharge: 2016-05-07 | Disposition: A | Discharge status: Home / Self Care | Payer: Medicare Other | Source: Ambulatory Visit | Attending: Cardiology | Admitting: Cardiology

## 2016-05-07 DIAGNOSIS — I6523 Occlusion and stenosis of bilateral carotid arteries: Secondary | ICD-10-CM | POA: Insufficient documentation

## 2016-05-07 DIAGNOSIS — I779 Disorder of arteries and arterioles, unspecified: Secondary | ICD-10-CM | POA: Diagnosis not present

## 2016-05-11 ENCOUNTER — Other Ambulatory Visit (HOSPITAL_COMMUNITY): Payer: Self-pay | Admitting: Adult Health

## 2016-05-13 ENCOUNTER — Other Ambulatory Visit: Payer: Self-pay | Admitting: Cardiovascular Disease

## 2016-05-13 DIAGNOSIS — I739 Peripheral vascular disease, unspecified: Principal | ICD-10-CM

## 2016-05-13 DIAGNOSIS — I779 Disorder of arteries and arterioles, unspecified: Secondary | ICD-10-CM

## 2016-05-14 ENCOUNTER — Other Ambulatory Visit (HOSPITAL_COMMUNITY): Payer: Self-pay | Admitting: Internal Medicine

## 2016-05-15 ENCOUNTER — Inpatient Hospital Stay (HOSPITAL_COMMUNITY)
Admission: AD | Admit: 2016-05-15 | Discharge: 2016-05-21 | DRG: 871 | Disposition: A | Discharge status: Home / Self Care | Payer: Medicare Other | Source: Other Acute Inpatient Hospital | Attending: Internal Medicine | Admitting: Internal Medicine

## 2016-05-15 ENCOUNTER — Inpatient Hospital Stay (HOSPITAL_COMMUNITY): Payer: Medicare Other

## 2016-05-15 DIAGNOSIS — G4733 Obstructive sleep apnea (adult) (pediatric): Secondary | ICD-10-CM | POA: Diagnosis present

## 2016-05-15 DIAGNOSIS — J9690 Respiratory failure, unspecified, unspecified whether with hypoxia or hypercapnia: Secondary | ICD-10-CM

## 2016-05-15 DIAGNOSIS — E875 Hyperkalemia: Secondary | ICD-10-CM | POA: Diagnosis not present

## 2016-05-15 DIAGNOSIS — N184 Chronic kidney disease, stage 4 (severe): Secondary | ICD-10-CM

## 2016-05-15 DIAGNOSIS — I4891 Unspecified atrial fibrillation: Secondary | ICD-10-CM | POA: Diagnosis present

## 2016-05-15 DIAGNOSIS — I739 Peripheral vascular disease, unspecified: Secondary | ICD-10-CM | POA: Diagnosis present

## 2016-05-15 DIAGNOSIS — A419 Sepsis, unspecified organism: Secondary | ICD-10-CM | POA: Diagnosis present

## 2016-05-15 DIAGNOSIS — I447 Left bundle-branch block, unspecified: Secondary | ICD-10-CM | POA: Diagnosis not present

## 2016-05-15 DIAGNOSIS — J189 Pneumonia, unspecified organism: Secondary | ICD-10-CM | POA: Diagnosis present

## 2016-05-15 DIAGNOSIS — E1151 Type 2 diabetes mellitus with diabetic peripheral angiopathy without gangrene: Secondary | ICD-10-CM | POA: Diagnosis not present

## 2016-05-15 DIAGNOSIS — N183 Chronic kidney disease, stage 3 (moderate): Secondary | ICD-10-CM | POA: Diagnosis not present

## 2016-05-15 DIAGNOSIS — I6523 Occlusion and stenosis of bilateral carotid arteries: Secondary | ICD-10-CM | POA: Diagnosis not present

## 2016-05-15 DIAGNOSIS — E876 Hypokalemia: Secondary | ICD-10-CM | POA: Diagnosis not present

## 2016-05-15 DIAGNOSIS — E1165 Type 2 diabetes mellitus with hyperglycemia: Secondary | ICD-10-CM

## 2016-05-15 DIAGNOSIS — R6521 Severe sepsis with septic shock: Secondary | ICD-10-CM | POA: Diagnosis present

## 2016-05-15 DIAGNOSIS — N179 Acute kidney failure, unspecified: Secondary | ICD-10-CM

## 2016-05-15 DIAGNOSIS — J449 Chronic obstructive pulmonary disease, unspecified: Secondary | ICD-10-CM | POA: Diagnosis not present

## 2016-05-15 DIAGNOSIS — R7303 Prediabetes: Secondary | ICD-10-CM | POA: Diagnosis present

## 2016-05-15 DIAGNOSIS — E118 Type 2 diabetes mellitus with unspecified complications: Secondary | ICD-10-CM

## 2016-05-15 DIAGNOSIS — R570 Cardiogenic shock: Secondary | ICD-10-CM

## 2016-05-15 DIAGNOSIS — E1122 Type 2 diabetes mellitus with diabetic chronic kidney disease: Secondary | ICD-10-CM | POA: Diagnosis present

## 2016-05-15 DIAGNOSIS — IMO0002 Reserved for concepts with insufficient information to code with codable children: Secondary | ICD-10-CM

## 2016-05-15 DIAGNOSIS — I428 Other cardiomyopathies: Secondary | ICD-10-CM | POA: Diagnosis not present

## 2016-05-15 DIAGNOSIS — E785 Hyperlipidemia, unspecified: Secondary | ICD-10-CM | POA: Diagnosis present

## 2016-05-15 DIAGNOSIS — J9601 Acute respiratory failure with hypoxia: Secondary | ICD-10-CM | POA: Diagnosis present

## 2016-05-15 DIAGNOSIS — E872 Acidosis: Secondary | ICD-10-CM | POA: Diagnosis not present

## 2016-05-15 DIAGNOSIS — J440 Chronic obstructive pulmonary disease with acute lower respiratory infection: Secondary | ICD-10-CM | POA: Diagnosis not present

## 2016-05-15 DIAGNOSIS — J810 Acute pulmonary edema: Secondary | ICD-10-CM | POA: Diagnosis not present

## 2016-05-15 DIAGNOSIS — Z7901 Long term (current) use of anticoagulants: Secondary | ICD-10-CM

## 2016-05-15 DIAGNOSIS — Z9981 Dependence on supplemental oxygen: Secondary | ICD-10-CM

## 2016-05-15 DIAGNOSIS — R7989 Other specified abnormal findings of blood chemistry: Secondary | ICD-10-CM | POA: Diagnosis present

## 2016-05-15 DIAGNOSIS — Z87891 Personal history of nicotine dependence: Secondary | ICD-10-CM

## 2016-05-15 DIAGNOSIS — I130 Hypertensive heart and chronic kidney disease with heart failure and stage 1 through stage 4 chronic kidney disease, or unspecified chronic kidney disease: Secondary | ICD-10-CM | POA: Diagnosis present

## 2016-05-15 DIAGNOSIS — Z8673 Personal history of transient ischemic attack (TIA), and cerebral infarction without residual deficits: Secondary | ICD-10-CM

## 2016-05-15 DIAGNOSIS — N289 Disorder of kidney and ureter, unspecified: Secondary | ICD-10-CM

## 2016-05-15 DIAGNOSIS — I2510 Atherosclerotic heart disease of native coronary artery without angina pectoris: Secondary | ICD-10-CM | POA: Diagnosis present

## 2016-05-15 DIAGNOSIS — I5043 Acute on chronic combined systolic (congestive) and diastolic (congestive) heart failure: Secondary | ICD-10-CM

## 2016-05-15 DIAGNOSIS — R7881 Bacteremia: Secondary | ICD-10-CM | POA: Diagnosis not present

## 2016-05-15 DIAGNOSIS — Z8249 Family history of ischemic heart disease and other diseases of the circulatory system: Secondary | ICD-10-CM

## 2016-05-15 DIAGNOSIS — I480 Paroxysmal atrial fibrillation: Secondary | ICD-10-CM | POA: Diagnosis present

## 2016-05-15 DIAGNOSIS — Z95810 Presence of automatic (implantable) cardiac defibrillator: Secondary | ICD-10-CM | POA: Diagnosis present

## 2016-05-15 DIAGNOSIS — Z79899 Other long term (current) drug therapy: Secondary | ICD-10-CM

## 2016-05-15 DIAGNOSIS — N189 Chronic kidney disease, unspecified: Secondary | ICD-10-CM

## 2016-05-15 DIAGNOSIS — I5023 Acute on chronic systolic (congestive) heart failure: Secondary | ICD-10-CM | POA: Diagnosis not present

## 2016-05-15 DIAGNOSIS — I481 Persistent atrial fibrillation: Secondary | ICD-10-CM

## 2016-05-15 DIAGNOSIS — E039 Hypothyroidism, unspecified: Secondary | ICD-10-CM | POA: Diagnosis present

## 2016-05-15 DIAGNOSIS — N170 Acute kidney failure with tubular necrosis: Secondary | ICD-10-CM | POA: Diagnosis present

## 2016-05-15 DIAGNOSIS — I340 Nonrheumatic mitral (valve) insufficiency: Secondary | ICD-10-CM

## 2016-05-15 DIAGNOSIS — B957 Other staphylococcus as the cause of diseases classified elsewhere: Secondary | ICD-10-CM | POA: Diagnosis present

## 2016-05-15 DIAGNOSIS — L8990 Pressure ulcer of unspecified site, unspecified stage: Secondary | ICD-10-CM | POA: Insufficient documentation

## 2016-05-15 DIAGNOSIS — I4819 Other persistent atrial fibrillation: Secondary | ICD-10-CM

## 2016-05-15 HISTORY — DX: Respiratory failure, unspecified, unspecified whether with hypoxia or hypercapnia: J96.90

## 2016-05-15 LAB — COMPREHENSIVE METABOLIC PANEL
ALT: 39 U/L (ref 17–63)
AST: 31 U/L (ref 15–41)
Albumin: 3.4 g/dL — ABNORMAL LOW (ref 3.5–5.0)
Alkaline Phosphatase: 126 U/L (ref 38–126)
Anion gap: 14 (ref 5–15)
BUN: 44 mg/dL — AB (ref 6–20)
CHLORIDE: 99 mmol/L — AB (ref 101–111)
CO2: 21 mmol/L — AB (ref 22–32)
Calcium: 8.5 mg/dL — ABNORMAL LOW (ref 8.9–10.3)
Creatinine, Ser: 2.86 mg/dL — ABNORMAL HIGH (ref 0.61–1.24)
GFR calc Af Amer: 22 mL/min — ABNORMAL LOW (ref >60)
GFR, EST NON AFRICAN AMERICAN: 19 mL/min — AB (ref >60)
Glucose, Bld: 371 mg/dL — ABNORMAL HIGH (ref 65–99)
Potassium: 4.8 mmol/L (ref 3.5–5.1)
Sodium: 134 mmol/L — ABNORMAL LOW (ref 135–145)
Total Bilirubin: 1.6 mg/dL — ABNORMAL HIGH (ref 0.3–1.2)
Total Protein: 5.6 g/dL — ABNORMAL LOW (ref 6.5–8.1)

## 2016-05-15 LAB — CBC WITH DIFFERENTIAL/PLATELET
BASOS ABS: 0 10*3/uL (ref 0.0–0.1)
Basophils Relative: 0 %
EOS PCT: 0 %
Eosinophils Absolute: 0 10*3/uL (ref 0.0–0.7)
HEMATOCRIT: 42.2 % (ref 39.0–52.0)
HEMOGLOBIN: 14.3 g/dL (ref 13.0–17.0)
LYMPHS PCT: 3 %
Lymphs Abs: 0.3 10*3/uL — ABNORMAL LOW (ref 0.7–4.0)
MCH: 33.2 pg (ref 26.0–34.0)
MCHC: 33.9 g/dL (ref 30.0–36.0)
MCV: 97.9 fL (ref 78.0–100.0)
Monocytes Absolute: 0.3 10*3/uL (ref 0.1–1.0)
Monocytes Relative: 2 %
NEUTROS ABS: 10.3 10*3/uL — AB (ref 1.7–7.7)
NEUTROS PCT: 95 %
PLATELETS: 128 10*3/uL — AB (ref 150–400)
RBC: 4.31 MIL/uL (ref 4.22–5.81)
RDW: 13.4 % (ref 11.5–15.5)
WBC: 10.9 10*3/uL — ABNORMAL HIGH (ref 4.0–10.5)

## 2016-05-15 LAB — URINALYSIS, ROUTINE W REFLEX MICROSCOPIC
Bilirubin Urine: NEGATIVE
Glucose, UA: >=500 mg/dL — AB
Ketones, ur: NEGATIVE mg/dL
Leukocytes, UA: NEGATIVE
Nitrite: NEGATIVE
Protein, ur: NEGATIVE mg/dL
SPECIFIC GRAVITY, URINE: 1.008 (ref 1.005–1.030)
pH: 5 (ref 5.0–8.0)

## 2016-05-15 LAB — GLUCOSE, CAPILLARY
GLUCOSE-CAPILLARY: 116 mg/dL — AB (ref 65–99)
GLUCOSE-CAPILLARY: 122 mg/dL — AB (ref 65–99)
GLUCOSE-CAPILLARY: 147 mg/dL — AB (ref 65–99)
GLUCOSE-CAPILLARY: 151 mg/dL — AB (ref 65–99)
GLUCOSE-CAPILLARY: 180 mg/dL — AB (ref 65–99)
GLUCOSE-CAPILLARY: 183 mg/dL — AB (ref 65–99)
Glucose-Capillary: 367 mg/dL — ABNORMAL HIGH (ref 65–99)
Glucose-Capillary: 342 mg/dL — ABNORMAL HIGH (ref 65–99)
Glucose-Capillary: 293 mg/dL — ABNORMAL HIGH (ref 65–99)
Glucose-Capillary: 262 mg/dL — ABNORMAL HIGH (ref 65–99)
Glucose-Capillary: 228 mg/dL — ABNORMAL HIGH (ref 65–99)
Glucose-Capillary: 176 mg/dL — ABNORMAL HIGH (ref 65–99)
Glucose-Capillary: 161 mg/dL — ABNORMAL HIGH (ref 65–99)

## 2016-05-15 LAB — TROPONIN I
TROPONIN I: 0.07 ng/mL — AB (ref <0.03)
TROPONIN I: 0.06 ng/mL — AB (ref <0.03)
Troponin I: 0.06 ng/mL (ref <0.03)

## 2016-05-15 LAB — PROTIME-INR
INR: 1.07
Prothrombin Time: 13.9 seconds (ref 11.4–15.2)

## 2016-05-15 LAB — TRIGLYCERIDES: TRIGLYCERIDES: 58 mg/dL (ref <150)

## 2016-05-15 LAB — LACTIC ACID, PLASMA
LACTIC ACID, VENOUS: 2.6 mmol/L — AB (ref 0.5–1.9)
LACTIC ACID, VENOUS: 3.2 mmol/L — AB (ref 0.5–1.9)

## 2016-05-15 LAB — MAGNESIUM: MAGNESIUM: 2 mg/dL (ref 1.7–2.4)

## 2016-05-15 LAB — COOXEMETRY PANEL
Carboxyhemoglobin: 0.8 % (ref 0.5–1.5)
Methemoglobin: 1 % (ref 0.0–1.5)
O2 Saturation: 77 %
Total hemoglobin: 14.2 g/dL (ref 12.0–16.0)

## 2016-05-15 LAB — MRSA PCR SCREENING: MRSA BY PCR: NEGATIVE

## 2016-05-15 MED ORDER — PROPOFOL 1000 MG/100ML IV EMUL
0.0000 ug/kg/min | INTRAVENOUS | Status: DC
  Administered 2016-05-15: 25 ug/kg/min via INTRAVENOUS
  Administered 2016-05-15: 20 ug/kg/min via INTRAVENOUS
  Administered 2016-05-15: 25 ug/kg/min via INTRAVENOUS
  Administered 2016-05-16: 10 ug/kg/min via INTRAVENOUS
  Filled 2016-05-15 (×4): qty 100

## 2016-05-15 MED ORDER — CHLORHEXIDINE GLUCONATE 0.12% ORAL RINSE (MEDLINE KIT)
15.0000 mL | Freq: Two times a day (BID) | OROMUCOSAL | Status: DC
  Administered 2016-05-15 – 2016-05-16 (×3): 15 mL via OROMUCOSAL

## 2016-05-15 MED ORDER — FENTANYL CITRATE (PF) 100 MCG/2ML IJ SOLN: 50.0000 ug | INTRAMUSCULAR | Status: DC | PRN

## 2016-05-15 MED ORDER — INSULIN ASPART 100 UNIT/ML ~~LOC~~ SOLN
1.0000 [IU] | SUBCUTANEOUS | Status: DC
  Administered 2016-05-16 (×2): 2 [IU] via SUBCUTANEOUS
  Administered 2016-05-16: 1 [IU] via SUBCUTANEOUS
  Administered 2016-05-16: 3 [IU] via SUBCUTANEOUS
  Administered 2016-05-16: 1 [IU] via SUBCUTANEOUS
  Administered 2016-05-16 (×2): 2 [IU] via SUBCUTANEOUS
  Administered 2016-05-17: 3 [IU] via SUBCUTANEOUS
  Administered 2016-05-17: 1 [IU] via SUBCUTANEOUS
  Administered 2016-05-17 – 2016-05-18 (×2): 2 [IU] via SUBCUTANEOUS
  Administered 2016-05-18: 1 [IU] via SUBCUTANEOUS
  Administered 2016-05-18: 2 [IU] via SUBCUTANEOUS
  Administered 2016-05-19: 1 [IU] via SUBCUTANEOUS

## 2016-05-15 MED ORDER — DEXTROSE 5 % IV SOLN
1.0000 g | INTRAVENOUS | Status: DC
  Administered 2016-05-15 – 2016-05-16 (×2): 1 g via INTRAVENOUS
  Filled 2016-05-15 (×2): qty 1

## 2016-05-15 MED ORDER — FENTANYL CITRATE (PF) 100 MCG/2ML IJ SOLN
50.0000 ug | INTRAMUSCULAR | Status: DC | PRN
  Administered 2016-05-15: 50 ug via INTRAVENOUS
  Filled 2016-05-15: qty 2

## 2016-05-15 MED ORDER — INSULIN GLARGINE 100 UNIT/ML ~~LOC~~ SOLN
30.0000 [IU] | SUBCUTANEOUS | Status: DC
  Administered 2016-05-15 – 2016-05-20 (×6): 30 [IU] via SUBCUTANEOUS
  Filled 2016-05-15 (×7): qty 0.3

## 2016-05-15 MED ORDER — INSULIN ASPART 100 UNIT/ML ~~LOC~~ SOLN
0.0000 [IU] | Freq: Three times a day (TID) | SUBCUTANEOUS | Status: DC
  Administered 2016-05-15: 15 [IU] via SUBCUTANEOUS

## 2016-05-15 MED ORDER — APIXABAN 2.5 MG PO TABS
2.5000 mg | ORAL_TABLET | Freq: Two times a day (BID) | ORAL | Status: DC
  Administered 2016-05-15 – 2016-05-16 (×4): 2.5 mg
  Filled 2016-05-15 (×4): qty 1

## 2016-05-15 MED ORDER — SODIUM CHLORIDE 0.9% FLUSH
10.0000 mL | INTRAVENOUS | Status: DC | PRN
  Administered 2016-05-21: 10 mL
  Filled 2016-05-15: qty 40

## 2016-05-15 MED ORDER — SODIUM CHLORIDE 0.9 % IV SOLN
250.0000 mL | INTRAVENOUS | Status: DC | PRN
  Administered 2016-05-15: 250 mL via INTRAVENOUS

## 2016-05-15 MED ORDER — NOREPINEPHRINE BITARTRATE 1 MG/ML IV SOLN
0.0000 ug/min | INTRAVENOUS | Status: DC
  Administered 2016-05-15: 8 ug/min via INTRAVENOUS
  Filled 2016-05-15 (×2): qty 16

## 2016-05-15 MED ORDER — SODIUM CHLORIDE 0.9 % IV SOLN: INTRAVENOUS | Status: DC

## 2016-05-15 MED ORDER — ORAL CARE MOUTH RINSE
15.0000 mL | OROMUCOSAL | Status: DC
  Administered 2016-05-15 – 2016-05-16 (×11): 15 mL via OROMUCOSAL

## 2016-05-15 MED ORDER — FUROSEMIDE 10 MG/ML IJ SOLN
80.0000 mg | Freq: Four times a day (QID) | INTRAMUSCULAR | Status: DC
  Administered 2016-05-15 – 2016-05-16 (×4): 80 mg via INTRAVENOUS
  Filled 2016-05-15 (×4): qty 8

## 2016-05-15 MED ORDER — ASPIRIN 300 MG RE SUPP
300.0000 mg | RECTAL | Status: AC
  Administered 2016-05-15: 300 mg via RECTAL
  Filled 2016-05-15: qty 1

## 2016-05-15 MED ORDER — FAMOTIDINE IN NACL 20-0.9 MG/50ML-% IV SOLN
20.0000 mg | Freq: Every day | INTRAVENOUS | Status: DC
  Administered 2016-05-15 – 2016-05-16 (×2): 20 mg via INTRAVENOUS
  Filled 2016-05-15 (×2): qty 50

## 2016-05-15 MED ORDER — VANCOMYCIN HCL IN DEXTROSE 750-5 MG/150ML-% IV SOLN
750.0000 mg | INTRAVENOUS | Status: DC
  Administered 2016-05-15 – 2016-05-18 (×4): 750 mg via INTRAVENOUS
  Filled 2016-05-15 (×4): qty 150

## 2016-05-15 MED ORDER — SODIUM CHLORIDE 0.9 % IV SOLN
INTRAVENOUS | Status: DC
  Administered 2016-05-15: 2.8 [IU]/h via INTRAVENOUS
  Filled 2016-05-15: qty 2.5

## 2016-05-15 MED ORDER — SODIUM CHLORIDE 0.9% FLUSH
10.0000 mL | Freq: Two times a day (BID) | INTRAVENOUS | Status: DC
  Administered 2016-05-15 – 2016-05-16 (×3): 10 mL
  Administered 2016-05-16: 20 mL
  Administered 2016-05-17 – 2016-05-18 (×4): 10 mL
  Administered 2016-05-19: 20 mL
  Administered 2016-05-19 – 2016-05-20 (×2): 10 mL

## 2016-05-15 MED ORDER — ASPIRIN 81 MG PO CHEW
324.0000 mg | CHEWABLE_TABLET | ORAL | Status: AC
  Filled 2016-05-15: qty 4

## 2016-05-15 MED ORDER — AMIODARONE HCL 200 MG PO TABS
200.0000 mg | ORAL_TABLET | Freq: Every day | ORAL | Status: DC
  Administered 2016-05-15 – 2016-05-16 (×2): 200 mg
  Filled 2016-05-15 (×2): qty 1

## 2016-05-15 MED ORDER — INSULIN ASPART 100 UNIT/ML ~~LOC~~ SOLN: 0.0000 [IU] | Freq: Every day | SUBCUTANEOUS | Status: DC

## 2016-05-15 NOTE — Progress Notes (Signed)
Patient's 0800 CBG 367.  Dr. Vassie Loll notified and gave orders to trend CBG's with next mealtime CBG before initiating insulin gtt.  Will continue to monitor. [place], Mitzi Hansen

## 2016-05-15 NOTE — Consult Note (Signed)
Reason for Consult: Acute respiratory failure/Acute on chronic CHF  Requesting Physician: Elsworth Soho  Cardiologist: Berry/Bensimhon/Allred [facility] - Andree Elk, GUIDE-IT trial  HPI: This is a 83 y.o. male with a past medical history significant for severe nonischemic cardiomyopathy (EF<20%), s/p CRT-D (MDT), severe central mitral insufficiency, recurrent persistent atrial fibrillationwith most recent cardioversion in October 2017 (on amiodarone and low dose Eliquis), COPD, chronic kidney disease stage IV, history of hypertension, history of stroke, history of stenting for left renal artery stenosis currently intubated/mechanically ventilated following respiratory failure at [facility]. History is obtained from the patient's family.  The patient's wife describes a sudden onset of severe dyspnea so that the patient was unable to lie flat in bed. He describes the sensation of a ice cube in his throat(a complaint he has had before when he has heart failure). At the [facility] in [place]. He was initially managed with BiPAP and was given steroids, antibiotics and diuretics. Eventually he required intubation and was transferred to [facility].  One of the patient's daughters reports that he had serious problems with breathing following the Christmas meal but his wife states that he subsequently improved. As far as they know he has been compliant with his medications. He has not had recent fever, chills or productive cough, leg edema, hemoptysis, wheezing. His appetite has been poor and he does not like unsalted food. He has lost substantial weight recently  He underwent right and left heart catheterization in July 2017. He had only minor CAD. The pulmonary artery wedge pressure at that time was 12 mmHg and he weighed to 190 pounds. Since that time his weight has decreased and his wife believes that last week it was as low as 179 pounds. Today he weighs 183 pounds.  His last defibrillator check  December 14 showed normal device function and no recurrence of atrial fibrillation since cardioversion. On December 18 his thoracic impedance showed evidence of early fluid gain. His baseline creatinine seems to be approximate 1.8-2.0. Following diuresis in July it peaked at 2.7. His creatinine today is high at 2.8. His chest x-ray shows finding consistent with pulmonary edema.  PMHx:  Past Medical History:  Diagnosis Date  . Carotid artery disease ([place])    a. 04/2015 mild to mod bilat ICA stenosis;  b. 11/9890 >11% LICA stenosis, no significant dzs on right.  . Chronic combined systolic and diastolic CHF, NYHA class 4 ([place])    a. 01/2013 Echo: EF 25-30%; b. 08/2013 Echo: EF 15-20%; c. 04/2014 Echo: EF 20-25%, mild LVH, Gr1 DD, mild MR, mod dil LA.  . CKD (chronic kidney disease), stage IV ([place])    a. baseline creat of ~ 1.8;  b. 09[facility] and [facility].  [name] History of stroke   . Hyperlipidemia   . Hypertensive heart disease with CHF (congestive heart failure) ([place])   . ICD (implantable cardioverter-defibrillator), dual, in situ    a. 09/2013 s/p MDT Hillery Aldo XT CRT-D (ser #[id number]).  . LBBB (left bundle branch block)   . Non-obstructive CAD    a. 11/2001 Cath [facility]): LM nl, LAD 74m LCX nondom, nl, RI large, nl, RCA dominant, 4103m.  . Mar[place]onischemic cardiomyopathy ([place]   a. 11/2001 nonobs cath; b. 09/2013 s/p MDT ViHillery AldoT CRT-D (ser #B#[id number]); c. 04/2014 Echo: EF 20-25%;  d. Followed @ [facility] by K. AdAndree ElkMD.  . OSA (obstructive sleep apnea)    a. Does not tolerate/use CPAP.  . Pre-diabetes   .  PVD (peripheral vascular disease) ([place])    a. 10/2005 PTA: right external iliac artery angioplasty and left renal artery angioplasty and stenting;  b. 10/2015 ABI's in setting of recurrent claudication: R 0.97, L 0.77 (reduced by 0.12 from prior study).  . Renal artery stenosis ([place])    a. 10/2005 s/p L renal artery stenting.  [name] TIA (transient ischemic attack) 06/2010     Past Surgical History:  Procedure Laterality Date  . BI-VENTRICULAR IMPLANTABLE CARDIOVERTER DEFIBRILLATOR N/A 09/13/2013   Procedure: BI-VENTRICULAR IMPLANTABLE CARDIOVERTER DEFIBRILLATOR  (CRT-D);  Surgeon: Coralyn Mark, MD;  Location: [facility] CATH LAB;  Service: Cardiovascular;  Laterality: N/A;  . BI-VENTRICULAR IMPLANTABLE CARDIOVERTER DEFIBRILLATOR  (CRT-D)  09-13-2013   MDT CRTD implanted by Dr [name]  . CARDIAC CATHETERIZATION  2003   Mild LV dysfuntion. he had apical and inferoapical wall motion abnormalities and EF of 45%.  [name] CARDIAC CATHETERIZATION  10/18/2005   STENTS: left renal artery as well as both iliac arteries. Was stented with 66m x 474mSmart Nitinol self-expanding stent replaced with a 6m85m 4mm83mlloon at nominal pressures, resulting in a reduction od 50% proximal right external iliac artery  stenosis to 0% residual.  . CARDIAC CATHETERIZATION N/A 11/13/2015   Procedure: Right/Left Heart Cath and Coronary Angiography;  Surgeon: DaniJolaine Artist;  [facility];  Service: Cardiovascular;  Laterality: N/A;  . CARDIOVERSION N/A 02/25/2016   Procedure: CARDIOVERSION;  Surgeon: DaniJolaine Artist;  [facility];  Service: Cardiovascular;  Laterality: N/A;  . HAND SURGERY  01/1995  . HERNIA REPAIR    . TEE WITHOUT CARDIOVERSION N/A 02/25/2016   Procedure: TRANSESOPHAGEAL ECHOCARDIOGRAM (TEE);  Surgeon: DaniJolaine Artist;  [facility];  Service: Cardiovascular;  Laterality: N/A;    FAMHx: Family History  Problem Relation Age of Onset  . Alzheimer's disease Mother   . Heart disease Father   . Heart disease Paternal Grandfather   . Heart attack Paternal Grandfather     SOCHx:  reports that he quit smoking about 11 years ago. His smoking use included Cigarettes. He quit after 52.00 years of use. He has never used smokeless tobacco. He reports that he does not drink alcohol or use drugs.  ALLERGIES: Allergies  Allergen  Reactions  . Amoxicillin Itching    Itching    . Biaxin [Clarithromycin] Itching    Lip swelling     . Erythromycin Itching  . Penicillins Hives and Itching    Has patient had a PCN reaction causing immediate rash, facial/tongue/throat swelling, SOB or lightheadedness with hypotension: Yes Has patient had a PCN reaction causing severe rash involving mucus membranes or skin necrosis: No Has patient had a PCN reaction that required hospitalization No Has patient had a PCN reaction occurring within the last 10 years: No If all of the above answers are "NO", then may proceed with Cephalosporin use.  . Shellfish Allergy Itching        . Sulfa Antibiotics Other (See Comments)    AKI and hyperkalemia    ROS: Review of systems not obtained due to patient factors.  HOME MEDICATIONS: Prescriptions Prior to Admission  Medication Sig Dispense Refill Last Dose  . allopurinol (ZYLOPRIM) 100 MG tablet Take 100 mg by mouth daily.   Taking  . amiodarone (PACERONE) 200 MG tablet Take 1 tablet (200 mg total) by mouth daily. 90 tablet 3   . atorvastatin (LIPITOR) 20 MG tablet Take 20 mg by mouth daily.  Taking  . Blood Glucose Monitoring Suppl (FIFTY50 GLUCOSE METER 2.0) w/Device KIT    Taking  . Coenzyme Q10 (CO Q 10) 100 MG CAPS Take 100 mg by mouth daily.   Taking  . ELIQUIS 2.5 MG TABS tablet TAKE ONE TABLET BY MOUTH TWICE DAILY 60 tablet 3   . furosemide (LASIX) 40 MG tablet Take 1.5 tablets (60 mg total) by mouth daily. 90 tablet 3 Taking  . gabapentin (NEURONTIN) 300 MG capsule Take 300 mg by mouth at bedtime.   Taking  . isosorbide mononitrate (IMDUR) 30 MG 24 hr tablet Take 30 mg by mouth daily.   Taking  . Magnesium 500 MG CAPS Take 500 mg by mouth daily.   Taking  . metoprolol (TOPROL-XL) 200 MG 24 hr tablet Take 0.5 tablets (100 mg total) by mouth daily. 15 tablet 6 Taking  . niacin 500 MG tablet Take 500 mg by mouth daily with breakfast.   Taking  . nitroGLYCERIN (NITROSTAT) 0.4 MG  SL tablet Place 0.4 mg under the tongue every 5 (five) minutes as needed for chest pain (maximum 3 tablets).   Taking  . OXYGEN Inhale 2 L into the lungs at bedtime.   Taking  . spironolactone (ALDACTONE) 25 MG tablet Take 0.5 tablets (12.5 mg total) by mouth daily. 45 tablet 3 Taking  . zolpidem (AMBIEN) 10 MG tablet Take 5 mg by mouth at bedtime.    Taking    HOSPITAL MEDICATIONS: Prior to Admission:  Prescriptions Prior to Admission  Medication Sig Dispense Refill Last Dose  . allopurinol (ZYLOPRIM) 100 MG tablet Take 100 mg by mouth daily.   Taking  . amiodarone (PACERONE) 200 MG tablet Take 1 tablet (200 mg total) by mouth daily. 90 tablet 3   . atorvastatin (LIPITOR) 20 MG tablet Take 20 mg by mouth daily.   Taking  . Blood Glucose Monitoring Suppl (FIFTY50 GLUCOSE METER 2.0) w/Device KIT    Taking  . Coenzyme Q10 (CO Q 10) 100 MG CAPS Take 100 mg by mouth daily.   Taking  . ELIQUIS 2.5 MG TABS tablet TAKE ONE TABLET BY MOUTH TWICE DAILY 60 tablet 3   . furosemide (LASIX) 40 MG tablet Take 1.5 tablets (60 mg total) by mouth daily. 90 tablet 3 Taking  . gabapentin (NEURONTIN) 300 MG capsule Take 300 mg by mouth at bedtime.   Taking  . isosorbide mononitrate (IMDUR) 30 MG 24 hr tablet Take 30 mg by mouth daily.   Taking  . Magnesium 500 MG CAPS Take 500 mg by mouth daily.   Taking  . metoprolol (TOPROL-XL) 200 MG 24 hr tablet Take 0.5 tablets (100 mg total) by mouth daily. 15 tablet 6 Taking  . niacin 500 MG tablet Take 500 mg by mouth daily with breakfast.   Taking  . nitroGLYCERIN (NITROSTAT) 0.4 MG SL tablet Place 0.4 mg under the tongue every 5 (five) minutes as needed for chest pain (maximum 3 tablets).   Taking  . OXYGEN Inhale 2 L into the lungs at bedtime.   Taking  . spironolactone (ALDACTONE) 25 MG tablet Take 0.5 tablets (12.5 mg total) by mouth daily. 45 tablet 3 Taking  . zolpidem (AMBIEN) 10 MG tablet Take 5 mg by mouth at bedtime.    Taking    VITALS: Blood pressure  (!) 112/37, pulse 70, temperature 97.7 F (36.5 C), temperature source Axillary, resp. rate 16, height '6\' 2"'$  (1.88 m), weight 185 lb 3 oz (84 kg), SpO2 95 %.  PHYSICAL EXAM:  General: Alert, oriented x3, no distress Head: no evidence of trauma, PERRL, EOMI, no exophtalmos or lid lag, no myxedema, no xanthelasma; normal ears, nose and oropharynx Neck: Unable to see jugular venous pulsations and no hepatojugular reflux; brisk carotid pulses without delay and no carotid bruits Chest: clear to auscultation, no signs of consolidation by percussion or palpation, normal fremitus, symmetrical and full respiratory excursions Cardiovascular: normal position and quality of the apical impulse, regular rhythm, normal first heart sound and split second heart sound, no rubs or gallops, 2/6 apical holosystolic murmur Abdomen: no tenderness or distention, no masses by palpation, no abnormal pulsatility or arterial bruits, normal bowel sounds, no hepatosplenomegaly Extremities: no clubbing, cyanosis;  no edema; 2+ radial, ulnar and brachial pulses bilaterally; 2+ right femoral, posterior tibial and dorsalis pedis pulses; 2+ left femoral, posterior tibial and dorsalis pedis pulses; no subclavian or femoral bruits Neurological: grossly nonfocal   LABS  CBC  Recent Labs  05/15/16 0547  WBC 10.9*  NEUTROABS 10.3*  HGB 14.3  HCT 42.2  MCV 97.9  PLT 585*   Basic Metabolic Panel  Recent Labs  05/15/16 0547  NA 134*  K 4.8  CL 99*  CO2 21*  GLUCOSE 371*  BUN 44*  CREATININE 2.86*  CALCIUM 8.5*  MG 2.0   Liver Function Tests  Recent Labs  05/15/16 0547  AST 31  ALT 39  ALKPHOS 126  BILITOT 1.6*  PROT 5.6*  ALBUMIN 3.4*   No results for input(s): LIPASE, AMYLASE in the last 72 hours. Cardiac Enzymes  Recent Labs  05/15/16 0547  TROPONINI 0.07*   Fasting Lipid Panel  Recent Labs  05/15/16 0547  TRIG 58      IMAGING: Dg Chest Port 1 View  Result Date: 05/15/2016 CLINICAL  DATA:  Acute respiratory failure, status post line placement EXAM: PORTABLE CHEST 1 VIEW COMPARISON:  02/27/2016 FINDINGS: Pacing device is again seen and stable. Endotracheal tube, nasogastric catheter and right jugular central line are noted in satisfactory position. No pneumothorax is noted. The lungs are well aerated bilaterally with left retrocardiac consolidation and significant lower lobe infiltrate on the right. Increased perihilar changes are noted likely related to vascular congestion and pulmonary edema. Small pleural effusions are noted bilaterally. IMPRESSION: Central vascular congestion consistent with CHF and pulmonary edema. Bilateral lower lobe Infiltrates are seen worse on the left than the right. Tubes and lines as described above.  No pneumothorax is noted. Electronically Signed   By: Inez Catalina M.D.   On: 05/15/2016 07:48    ECG: Reviewed by me - atrial paced, biventricular paced  TELEMETRY: Reviewed by me - atrioventricular sequential pacing, no arrhythmia  IMPRESSION:  1. Cardiogenic shock 2. Acute hypoxic respiratory failure 3. Acute on chronic combined systolic and diastolic heart failure 4. Acute pulmonary edema 5. Severe nonischemic cardiomyopathy 6. Severe mitral insufficiency, likely secondary 7. History of persistent atrial fibrillation 8. History of CRT-D 9. Acute on chronic renal insufficiency 10. COPD     RECOMMENDATION: 1. Shock: Currently on low-dose norepinephrine IV. Beta blockers are on hold. Gradually wean pressors 2. Resp failure: Still on 60% oxygen, no efforts likely to be made to wean off ventilator today. 3. CHF: Continue diuresis, which may be limited by blood pressure and kidney function. Very uncertain of dry weight at this point. Clearly he has lost substantial real weight since his cardiac catheterization in the summer. Suspect he needs to be diuresed to less than 180 pounds 4. Pulm edema:  Chest x-ray consistent with heart failure.  Cannot exclude pneumonia but he does not have a fever or an elevated white count 5. CMP: Presumed hypertensive, no evidence of significant coronary stenoses at recent cardiac cath in July 2017 6. MR: Central directed and with severity that has waxed and waned on echo is, consistent with primary cardio myopathy and secondary mitral insufficiency 7. AFib: On amiodarone. History of stroke, continue full anticoagulation. We'll check defibrillator for any evidence of interim A. fib, but seems to be normal rhythm today. 8. CRT-D: Check device today to see when Optivol started to rise. 9. Ac/CKD: He is not on any RAAS inhibitors due to worsening renal function. Will have to watch this closely, but he is at high risk for development of cardiorenal syndrome. Might need central hemodynamic monitoring. 10. COPD: He is not wheezing and I do not see any distinct pneumonic type infiltrates on his chest x-ray. He did receive steroids and antibiotics in . Will defer need for further treatment to the pulmonary team.  This patient is critically ill with multiple organ system failure. Prognosis is strongly guarded.  Time Spent Directly with Patient: 60 minutes  Sanda Klein, MD, [facility] [phone] office [phone] pager   05/15/2016, 8:32 AM

## 2016-05-15 NOTE — Procedures (Signed)
Central Venous Catheter Insertion Procedure Note Scott Parsons [id number] 07-21-1929  Procedure: Insertion of Central Venous Catheter Indications: Assessment of intravascular volume, Drug and/or fluid administration and Frequent blood sampling  Procedure Details Consent: Risks of procedure as well as the alternatives and risks of each were explained to the (patient/caregiver).  Consent for procedure obtained. Time Out: Verified patient identification, verified procedure, site/side was marked, verified correct patient position, special equipment/implants available, medications/allergies/relevent history reviewed, required imaging and test results available.  Performed  Maximum sterile technique was used including antiseptics, cap, gloves, gown, hand hygiene, mask and sheet. Skin prep: Chlorhexidine; local anesthetic administered A antimicrobial bonded/coated triple lumen catheter was placed in the right internal jugular vein using the Seldinger technique.  Evaluation Blood flow good Complications: No apparent complications Patient did tolerate procedure well. Chest X-ray ordered to verify placement.  CXR: pending.  Scott Parsons 05/15/2016, 7:17 AM

## 2016-05-15 NOTE — Progress Notes (Signed)
Pharmacy Antibiotic Note  FORDHAM MENEELY is a 83 y.o. male admitted on 05/15/2016 with sepsis.  Pharmacy has been consulted for Vancomycin/Cefepime dosing. PNA as possible source. WBC 11.3 at outside hospital. Pt with know CKD, Scr 2.6 at outside hospital. Pt received vancomycin 2000 mg IV x 1 and Cefepime 1g IV x 1 at outside facility.   Plan: Vancomycin 750 mg IV q24h Cefepime 1g IV q24h Trend WBC, temp, renal function  F/U infectious work-up Drug levels as indicated   Height: 6\' 2"  (188 cm) Weight: 185 lb 3 oz (84 kg) IBW/kg (Calculated) : 82.2  Temp (24hrs), Avg:97.7 F (36.5 C), Min:97.7 F (36.5 C), Max:97.7 F (36.5 C)     Allergies  Allergen Reactions  . Amoxicillin Itching    Itching    . Biaxin [Clarithromycin] Itching    Lip swelling     . Erythromycin Itching  . Penicillins Hives and Itching    Has patient had a PCN reaction causing immediate rash, facial/tongue/throat swelling, SOB or lightheadedness with hypotension: Yes Has patient had a PCN reaction causing severe rash involving mucus membranes or skin necrosis: No Has patient had a PCN reaction that required hospitalization No Has patient had a PCN reaction occurring within the last 10 years: No If all of the above answers are "NO", then may proceed with Cephalosporin use.  . Shellfish Allergy Itching        . Sulfa Antibiotics Other (See Comments)    AKI and hyperkalemia    Abran Duke 05/15/2016 6:10 AM

## 2016-05-15 NOTE — Progress Notes (Signed)
eLink Physician-Brief Progress Note Patient Name: Scott Parsons DOB: 08-Mar-1930 MRN: [id number]   Date of Service  05/15/2016  HPI/Events of Note  New patient evaluation.   Patient transferred from [facility]. Patient known to have CHF, EF 20-25 percent, with AICD, with CK D with baseline creatinine 2, with COPD, presenting to [facility] emergency room with acute dyspnea after dinner. Patient took an extra dose of Lasix by mouth and he also got Lasix 80 mg IV. Intubated for respiratory failure. Initial ABG 7.28, 53, 61 on BiPAP. With intubation, ABG improved to 7.34, 45, 87 on 100% FiO2. Blood pressure dropped post intubation. He was started on dobutamine and levo fed.   Patient transferred to [facility]. Patient seen, not in distress, comfortable. Blood pressure 103/44, heart rate 70, respiratory rate 16, sats 95% on 100% FiO2.   Not sure if patient presented with acute CHF exacerbation versus pneumonia. Does not appear volume overloaded by exam.   Case discussed with Dr.Carly Rivet.   We'll get labs stat. Panculture. Start on broad-spectrum antibiotics. Avoid hydrating because of concern of pulmonary edema. We'll need to trend cardiac enzymes. Likely will need cardiology evaluation. Patient sees Dr. Johney Frame.   Pressors being weaned off. He has good peripherals. He will need a CVL but Dr. Eugenia Pancoast is currently busy with a code blue.  I discussed with the RN > CVL can wait as pt's BP is stable and pressors being weaned off.   eICU Interventions       Intervention Category Evaluation Type: New Patient Evaluation  Louann Sjogren 05/15/2016, 5:38 AM

## 2016-05-15 NOTE — Progress Notes (Signed)
Initial Nutrition Assessment  INTERVENTION:   Recommend: Vital AF 1.2 @ 40 ml/hr (960 ml/day) 30 ml Prostat TID Provides: 1452 kcal, 117 grams protein, and 778 ml H2O.  TF regimen and propofol at current rate providing 1718 total kcal/day   NUTRITION DIAGNOSIS:   Inadequate oral intake related to inability to eat as evidenced by NPO status.  GOAL:   Patient will meet greater than or equal to 90% of their needs  MONITOR:   I & O's, Vent status  REASON FOR ASSESSMENT:   Ventilator    ASSESSMENT:   83yo man with PMHx of chronic systolic CHF, COPD, AFib, NICM s/p biV ICD, and CKD stage IV presenting with acute hypoxic respiratory failure requiring intubation likely secondary to acute CHF exacerbation   Patient is currently intubated on ventilator support MV: 10.5 L/min Temp (24hrs), Avg:97.9 F (36.6 C), Min:97.7 F (36.5 C), Max:98.2 F (36.8 C)  Propofol: 10.1 ml/hr provides: 266 kcal per day from lipid Low dose levo CBG's: 367   Unable to complete Nutrition-Focused physical exam at this time.    Diet Order:  Diet NPO time specified  Skin:  Wound (see comment) (MASD buttocks, stage II buttock)  Last BM:  unknown  Height:   Ht Readings from Last 1 Encounters:  05/15/16 6\' 2"  (1.88 m)    Weight:   Wt Readings from Last 1 Encounters:  05/15/16 185 lb 3 oz (84 kg)    Ideal Body Weight:  86.3 kg  BMI:  Body mass index is 23.78 kg/m.  Estimated Nutritional Needs:   Kcal:  1767  Protein:  115-130 grams  Fluid:  >1.8 L/day  EDUCATION NEEDS:   No education needs identified at this time  Kendell Bane RD, LDN, CNSC [phone] Pager [phone] After Hours Pager

## 2016-05-15 NOTE — H&P (Signed)
PULMONARY / CRITICAL CARE MEDICINE   Name: Scott Parsons MRN: [id number] DOB: Apr 08, 1930    ADMISSION DATE:  05/15/16 CONSULTATION DATE:  05/15/16  REFERRING MD:  [facility]  CHIEF COMPLAINT:  Shortness of breath  HISTORY OF PRESENT ILLNESS:   Scott Parsons is a 83yo man with PMHx of chronic systolic CHF (EF 37-16% per last echo here), nonischemic cardiomyopathy, LBBB s/p biV ICD, AFib on Eliquis, COPD, and CKD stage IV who presented to [facility] with shortness of breath. He developed SOB and had taken an extra dose of Lasix 60 mg at home, but remained symptomatic so went to the hospital. At [facility] he was placed on BiPAP but oxygen saturation remained in the 70s and thus he was intubated. He received Lasix 80 mg IV, Solu-medrol with concern for possible COPD playing a role in his SOB, and broad spectrum antibiotics. His initial ABG revealed pH 7.28/pCO2 53/pO2 60 and after intubation improved to pH 7.34/pCO2 45/pO2 87. He was hypotensive so Levophed and Dobutamine had been initiated. He was then transferred to [facility] for further care.   PAST MEDICAL HISTORY :  He  has a past medical history of Carotid artery disease ([place]); Chronic combined systolic and diastolic CHF, NYHA class 4 (HCC); CKD (chronic kidney disease), stage IV ([place]); History of stroke; Hyperlipidemia; Hypertensive heart disease with CHF (congestive heart failure) ([place]); ICD (implantable cardioverter-defibrillator), dual, in situ; LBBB (left bundle branch block); Non-obstructive CAD; Nonischemic cardiomyopathy (HCC); OSA (obstructive sleep apnea); Pre-diabetes; PVD (peripheral vascular disease) ([place]); Renal artery stenosis ([place]); and TIA (transient ischemic attack) (06/2010).  PAST SURGICAL HISTORY: He  has a past surgical history that includes Cardiac catheterization (2003); Cardiac catheterization (10/18/2005); Hand surgery (01/1995); Hernia repair; Bi-ventricular implantable cardioverter defibrillator   (crt-d) (09-13-2013); bi-ventricular implantable cardioverter defibrillator (N/A, 09/13/2013); Cardiac catheterization (N/A, 11/13/2015); TEE without cardioversion (N/A, 02/25/2016); and Cardioversion (N/A, 02/25/2016).  Allergies  Allergen Reactions  . Amoxicillin Itching    Itching    . Biaxin [Clarithromycin] Itching    Lip swelling     . Erythromycin Itching  . Penicillins Hives and Itching    Has patient had a PCN reaction causing immediate rash, facial/tongue/throat swelling, SOB or lightheadedness with hypotension: Yes Has patient had a PCN reaction causing severe rash involving mucus membranes or skin necrosis: No Has patient had a PCN reaction that required hospitalization No Has patient had a PCN reaction occurring within the last 10 years: No If all of the above answers are "NO", then may proceed with Cephalosporin use.  . Shellfish Allergy Itching        . Sulfa Antibiotics Other (See Comments)    AKI and hyperkalemia    No current facility-administered medications on file prior to encounter.    Current Outpatient Prescriptions on File Prior to Encounter  Medication Sig  . allopurinol (ZYLOPRIM) 100 MG tablet Take 100 mg by mouth daily.  [name] amiodarone (PACERONE) 200 MG tablet Take 1 tablet (200 mg total) by mouth daily.  [name] atorvastatin (LIPITOR) 20 MG tablet Take 20 mg by mouth daily.  . Blood Glucose Monitoring Suppl (FIFTY50 GLUCOSE METER 2.0) w/Device KIT   . Coenzyme Q10 (CO Q 10) 100 MG CAPS Take 100 mg by mouth daily.  [name] ELIQUIS 2.5 MG TABS tablet TAKE ONE TABLET BY MOUTH TWICE DAILY  . furosemide (LASIX) 40 MG tablet Take 1.5 tablets (60 mg total) by mouth daily.  [name] gabapentin (NEURONTIN) 300 MG capsule Take 300 mg by mouth at  bedtime.  . isosorbide mononitrate (IMDUR) 30 MG 24 hr tablet Take 30 mg by mouth daily.  . Magnesium 500 MG CAPS Take 500 mg by mouth daily.  . metoprolol (TOPROL-XL) 200 MG 24 hr tablet Take 0.5 tablets (100 mg total) by mouth daily.  .  niacin 500 MG tablet Take 500 mg by mouth daily with breakfast.  . nitroGLYCERIN (NITROSTAT) 0.4 MG SL tablet Place 0.4 mg under the tongue every 5 (five) minutes as needed for chest pain (maximum 3 tablets).  . OXYGEN Inhale 2 L into the lungs at bedtime.  [name] spironolactone (ALDACTONE) 25 MG tablet Take 0.5 tablets (12.5 mg total) by mouth daily.  [name] zolpidem (AMBIEN) 10 MG tablet Take 5 mg by mouth at bedtime.     FAMILY HISTORY:  His indicated that his mother is deceased. He indicated that his father is deceased. He indicated that his maternal grandmother is deceased. He indicated that his maternal grandfather is deceased. He indicated that his paternal grandmother is deceased. He indicated that his paternal grandfather is deceased.    SOCIAL HISTORY: He  reports that he quit smoking about 11 years ago. His smoking use included Cigarettes. He quit after 52.00 years of use. He has never used smokeless tobacco. He reports that he does not drink alcohol or use drugs.  REVIEW OF SYSTEMS:   Unable to obtain due to AMS  SUBJECTIVE:  Patient sedated on vent. Responds to painful stimuli.   VITAL SIGNS: BP (!) 131/56   Pulse 69   Temp 97.7 F (36.5 C) (Axillary)   Resp 16   Ht '6\' 2"'$  (1.88 m)   Wt 185 lb 3 oz (84 kg)   SpO2 100%   BMI 23.78 kg/m   HEMODYNAMICS:    VENTILATOR SETTINGS: Vent Mode: PRVC FiO2 (%):  [70 %-100 %] 70 % Set Rate:  [16 bmp] 16 bmp Vt Set:  [600 mL-620 mL] 620 mL PEEP:  [5 cmH20] 5 cmH20 Plateau Pressure:  [17 cmH20] 17 cmH20  INTAKE / OUTPUT: No intake/output data recorded.  PHYSICAL EXAMINATION: General:  Elderly man sedated on vent Neuro: Sedated, responds to painful stimuli HEENT:  ETT in place, PERRL Cardiovascular: RRR, no m/g/r Lungs: CTA bilaterally, mildly decreased breath sounds at right lung base Abdomen: BS+, soft, non-tender Musculoskeletal: no peripheral edema Skin:  Warm, dry  LABS:  BMET No results for input(s): NA, K, CL, CO2,  BUN, CREATININE, GLUCOSE in the last 168 hours.  Electrolytes No results for input(s): CALCIUM, MG, PHOS in the last 168 hours.  CBC No results for input(s): WBC, HGB, HCT, PLT in the last 168 hours.  Coag's No results for input(s): APTT, INR in the last 168 hours.  Sepsis Markers No results for input(s): LATICACIDVEN, PROCALCITON, O2SATVEN in the last 168 hours.  ABG No results for input(s): PHART, PCO2ART, PO2ART in the last 168 hours.  Liver Enzymes No results for input(s): AST, ALT, ALKPHOS, BILITOT, ALBUMIN in the last 168 hours.  Cardiac Enzymes No results for input(s): TROPONINI, PROBNP in the last 168 hours.  Glucose No results for input(s): GLUCAP in the last 168 hours.  Imaging No results found.   STUDIES:  pCXR 1/6>> pending  CULTURES: Blood cx 1/6>> pending Urine cx 1/6>> pending  ANTIBIOTICS: Vanc 1/6>> Cefepime 1/6>>  SIGNIFICANT EVENTS: 1/5>> Presented to [facility] with acute respiratory failure requiring intubation  1/6>> Transferred to [facility] for further management   LINES/TUBES: ETT 1/5>>   DISCUSSION: Mr. Dahir is a 83yo  man with PMHx of chronic systolic CHF, COPD, AFib, NICM s/p biV ICD, and CKD stage IV presenting with acute hypoxic respiratory failure requiring intubation likely secondary to acute CHF exacerbation.  ASSESSMENT / PLAN:  PULMONARY A: Acute hypoxic respiratory failure likely due to CHF exacerbation vs CAP COPD, possibly playing role OSA P:   Vent bundle Repeat ABG Obtain CXR Start Lasix '80mg'$  Q6H Empiric abx SBT when able  CARDIOVASCULAR A:  Likely acute on chronic systolic CHF exacerbation (BNP 12,000 at [facility] but not overtly overloaded, low output HF?) Hypotension requiring dobutamine and levophed initially>> improving Nonischemic CMO  Persistent AFib Hx HTN Hyperlipidemia Known LBBB s/p biV ICD PVD P:  Lasix 80 mg Q6H Monitor UOP Wean pressors as able for goal MAP >65 Eliquis Repeat  EKG Trend troponins Check PT/INR Continue home Amio Cardiac monitoring  RENAL A:   AKI on CKD Stage IV- Cr 2.3 at [place], BL 1.8-2.0 Mild hyperkalemia Lactic acidosis  Hx renal artery stenosis s/p L renal artery stent in 2007 P:   Repeat bmet Trend lactic acid  GASTROINTESTINAL A:   Elevated LFTs- likely ischemia GI Prophylaxis P:   Pepcid  HEMATOLOGIC A:   Leukocytosis- 11 at [facility] P:  Repeat CBC  INFECTIOUS A:   Need to rule out underlying pneumonia P:   Obtain CXR Empiric abx as above Check UA Follow up cultures   ENDOCRINE A:   Elevated TSH (10 at [facility]), likely due to acute illness P:   Repeat TSH as outpatient  NEUROLOGIC A:   Hx CVA P:   RASS goal: -1 Fentanyl and Propofol   FAMILY  - Updates: No family available at bedside   - Inter-disciplinary family meet or Palliative Care meeting due by:  05/22/16   Albin Felling, MD, MPH Internal Medicine Resident, PGY-III Pager: [phone] 05/15/2016, 3:32 AM

## 2016-05-15 NOTE — Progress Notes (Signed)
Dr. Vassie Loll notified of patient's Lactic Acid of 3.2 & CBG of 342.  Insulin gtt initiated. [place], Mitzi Hansen

## 2016-05-16 ENCOUNTER — Encounter (HOSPITAL_COMMUNITY): Payer: Self-pay | Admitting: *Deleted

## 2016-05-16 DIAGNOSIS — I428 Other cardiomyopathies: Secondary | ICD-10-CM

## 2016-05-16 DIAGNOSIS — I5023 Acute on chronic systolic (congestive) heart failure: Secondary | ICD-10-CM

## 2016-05-16 DIAGNOSIS — N170 Acute kidney failure with tubular necrosis: Secondary | ICD-10-CM

## 2016-05-16 DIAGNOSIS — I480 Paroxysmal atrial fibrillation: Secondary | ICD-10-CM

## 2016-05-16 DIAGNOSIS — N183 Chronic kidney disease, stage 3 (moderate): Secondary | ICD-10-CM

## 2016-05-16 DIAGNOSIS — J9601 Acute respiratory failure with hypoxia: Secondary | ICD-10-CM | POA: Diagnosis present

## 2016-05-16 LAB — BASIC METABOLIC PANEL
ANION GAP: 12 (ref 5–15)
BUN: 50 mg/dL — ABNORMAL HIGH (ref 6–20)
CALCIUM: 9.3 mg/dL (ref 8.9–10.3)
CHLORIDE: 101 mmol/L (ref 101–111)
CO2: 25 mmol/L (ref 22–32)
CREATININE: 2.79 mg/dL — AB (ref 0.61–1.24)
GFR calc non Af Amer: 19 mL/min — ABNORMAL LOW (ref >60)
GFR, EST AFRICAN AMERICAN: 22 mL/min — AB (ref >60)
Glucose, Bld: 164 mg/dL — ABNORMAL HIGH (ref 65–99)
Potassium: 3.6 mmol/L (ref 3.5–5.1)
SODIUM: 138 mmol/L (ref 135–145)

## 2016-05-16 LAB — CBC
HEMATOCRIT: 43.6 % (ref 39.0–52.0)
HEMOGLOBIN: 14.7 g/dL (ref 13.0–17.0)
MCH: 32.7 pg (ref 26.0–34.0)
MCHC: 33.7 g/dL (ref 30.0–36.0)
MCV: 97.1 fL (ref 78.0–100.0)
Platelets: 180 10*3/uL (ref 150–400)
RBC: 4.49 MIL/uL (ref 4.22–5.81)
RDW: 13.9 % (ref 11.5–15.5)
WBC: 21.1 10*3/uL — AB (ref 4.0–10.5)

## 2016-05-16 LAB — COOXEMETRY PANEL
CARBOXYHEMOGLOBIN: 1.2 % (ref 0.5–1.5)
Methemoglobin: 0.6 % (ref 0.0–1.5)
O2 SAT: 70 %
Total hemoglobin: 15.1 g/dL (ref 12.0–16.0)

## 2016-05-16 LAB — MAGNESIUM: MAGNESIUM: 2.2 mg/dL (ref 1.7–2.4)

## 2016-05-16 LAB — GLUCOSE, CAPILLARY
GLUCOSE-CAPILLARY: 202 mg/dL — AB (ref 65–99)
GLUCOSE-CAPILLARY: 155 mg/dL — AB (ref 65–99)
GLUCOSE-CAPILLARY: 160 mg/dL — AB (ref 65–99)
GLUCOSE-CAPILLARY: 138 mg/dL — AB (ref 65–99)
Glucose-Capillary: 140 mg/dL — ABNORMAL HIGH (ref 65–99)
Glucose-Capillary: 161 mg/dL — ABNORMAL HIGH (ref 65–99)
Glucose-Capillary: 162 mg/dL — ABNORMAL HIGH (ref 65–99)

## 2016-05-16 LAB — URINE CULTURE: Culture: NO GROWTH

## 2016-05-16 LAB — PHOSPHORUS: PHOSPHORUS: 3.3 mg/dL (ref 2.5–4.6)

## 2016-05-16 LAB — HEMOGLOBIN A1C
Hgb A1c MFr Bld: 6.9 % — ABNORMAL HIGH (ref 4.8–5.6)
Mean Plasma Glucose: 151 mg/dL

## 2016-05-16 MED ORDER — ZOLPIDEM TARTRATE 5 MG PO TABS
5.0000 mg | ORAL_TABLET | Freq: Every evening | ORAL | Status: DC | PRN
  Administered 2016-05-16 – 2016-05-20 (×4): 5 mg via ORAL
  Filled 2016-05-16 (×4): qty 1

## 2016-05-16 MED ORDER — FUROSEMIDE 10 MG/ML IJ SOLN: 80.0000 mg | Freq: Two times a day (BID) | INTRAMUSCULAR | Status: DC

## 2016-05-16 MED ORDER — ORAL CARE MOUTH RINSE
15.0000 mL | Freq: Two times a day (BID) | OROMUCOSAL | Status: DC
  Administered 2016-05-16 – 2016-05-20 (×7): 15 mL via OROMUCOSAL

## 2016-05-16 NOTE — Procedures (Signed)
Extubation Procedure Note  Patient Details:   Name: COBY LAS DOB: [birth year] MRN: [id number]   Airway Documentation:     Evaluation  O2 sats: stable throughout Complications: No apparent complications Patient did tolerate procedure well. Bilateral Breath Sounds: Clear   Yes  Katheren Shams 05/16/2016, 12:13 PM

## 2016-05-16 NOTE — Progress Notes (Signed)
eLink Physician-Brief Progress Note Patient Name: SASCHA BONDER DOB: [birth year] MRN: [id number]   Date of Service  05/16/2016  HPI/Events of Note  Requesting ambien 5 mg hs as at home  eICU Interventions  Ok to resume     Intervention Category Minor Interventions: Routine modifications to care plan (e.g. PRN medications for pain, fever)  Sandrea Hughs 05/16/2016, 10:47 PM

## 2016-05-16 NOTE — Progress Notes (Addendum)
PULMONARY / CRITICAL CARE MEDICINE   Name: Scott Parsons MRN: [id number] DOB: 05-06-1930    ADMISSION DATE:  05/15/16 CONSULTATION DATE:  05/15/16  REFERRING MD:  [facility]  CHIEF COMPLAINT:  Shortness of breath  HISTORY OF PRESENT ILLNESS:   Scott Parsons is a 83yo man with PMHx of chronic systolic CHF (EF 09-73% per last echo here), nonischemic cardiomyopathy, LBBB s/p biV ICD, AFib on Eliquis, COPD, and CKD stage IV who presented to [facility] with shortness of breath. He developed SOB and had taken an extra dose of Lasix 60 mg at home, but remained symptomatic so went to the hospital. At [facility] he was placed on BiPAP but oxygen saturation remained in the 70s and thus he was intubated. He received Lasix 80 mg IV, Solu-medrol with concern for possible COPD playing a role in his SOB, and broad spectrum antibiotics. His initial ABG revealed pH 7.28/pCO2 53/pO2 60 and after intubation improved to pH 7.34/pCO2 45/pO2 87. He was hypotensive so Levophed and Dobutamine had been initiated. He was then transferred to [facility] for further care.   SUBJECTIVE:  Critically ill, intubated, on propofol gtt On low dose levo gtt Afebrile Good UO with lasix  VITAL SIGNS: BP 107/65   Pulse (!) 50   Temp 98.4 F (36.9 C)   Resp 16   Ht 6\' 2"  (1.88 m)   Wt 83.2 kg (183 lb 6.8 oz)   SpO2 97%   BMI 23.55 kg/m   HEMODYNAMICS: CVP:  [3 mmHg-4 mmHg] 3 mmHg  VENTILATOR SETTINGS: Vent Mode: PSV;CPAP FiO2 (%):  [40 %-50 %] 40 % Set Rate:  [16 bmp] 16 bmp Vt Set:  [620 mL] 620 mL PEEP:  [5 cmH20] 5 cmH20 Pressure Support:  [5 cmH20] 5 cmH20 Plateau Pressure:  [14 cmH20-16 cmH20] 16 cmH20  INTAKE / OUTPUT: I/O last 3 completed shifts: In: 1111.4 [I.V.:761.4; NG/GT:100; IV Piggyback:250] Out: 3975 [Urine:3975]  PHYSICAL EXAMINATION: General:  Elderly man sedated on vent Neuro: Sedated, easily awake & responsive, non focal HEENT:  ETT in place, PERRL Cardiovascular: RRR,  no m/g/r Lungs: CTA bilaterally, mildly decreased breath sounds at right lung base Abdomen: BS+, soft, non-tender Musculoskeletal: no peripheral edema Skin:  Warm, dry  LABS:  BMET  Recent Labs Lab 05/15/16 0547 05/16/16 0510  NA 134* 138  K 4.8 3.6  CL 99* 101  CO2 21* 25  BUN 44* 50*  CREATININE 2.86* 2.79*  GLUCOSE 371* 164*    Electrolytes  Recent Labs Lab 05/15/16 0547 05/16/16 0510  CALCIUM 8.5* 9.3  MG 2.0 2.2  PHOS  --  3.3    CBC  Recent Labs Lab 05/15/16 0547 05/16/16 0510  WBC 10.9* 21.1*  HGB 14.3 14.7  HCT 42.2 43.6  PLT 128* 180    Coag's  Recent Labs Lab 05/15/16 0547  INR 1.07    Sepsis Markers  Recent Labs Lab 05/15/16 0547 05/15/16 1027  LATICACIDVEN 2.6* 3.2*    ABG  Recent Labs Lab 05/16/16 0440  PHART 7.520*  PCO2ART 29.2*  PO2ART 83.4    Liver Enzymes  Recent Labs Lab 05/15/16 0547  AST 31  ALT 39  ALKPHOS 126  BILITOT 1.6*  ALBUMIN 3.4*    Cardiac Enzymes  Recent Labs Lab 05/15/16 0547 05/15/16 1028 05/15/16 1824  TROPONINI 0.07* 0.06* 0.06*    Glucose  Recent Labs Lab 05/15/16 2248 05/15/16 2350 05/16/16 0047 05/16/16 0138 05/16/16 0503 05/16/16 0828  GLUCAP 161* 180* 162* 202* 155*  160*    Imaging No results found.   STUDIES:  TEE 02/2016 EF 15%  CULTURES: Blood cx 1/6>> [place] > GPCs 2/2 >> Urine cx 1/6>>   ANTIBIOTICS: Vanc 1/6>> Cefepime 1/6>>  SIGNIFICANT EVENTS: 1/5>> Presented to [facility] with acute respiratory failure requiring intubation  1/6>> Transferred to [facility] for further management   LINES/TUBES: ETT 1/5>>   DISCUSSION: Scott Parsons is a 83yo man with PMHx of chronic systolic CHF, COPD, AFib, NICM s/p biV ICD, and CKD stage IV presenting with acute hypoxic respiratory failure requiring intubation likely secondary to acute CHF exacerbation.  ASSESSMENT / PLAN:  PULMONARY A: Acute hypoxic respiratory failure likely due to CHF exacerbation  vs CAP COPD, possibly playing role OSA P:   Vent bundle SBT with goal extubation  CARDIOVASCULAR A:  Likely acute on chronic systolic CHF exacerbation (BNP 12,000 at [facility] but not overtly overloaded, low output HF?) Hypotension requiring dobutamine and levophed initially>> improving Nonischemic CMO  Persistent AFib Hx HTN Hyperlipidemia Known LBBB s/p biV ICD PVD P:  Lasix 80 mg Q12h - low CVP Monitor UOP Wean pressors as able for goal MAP >65 Eliquis Check PT/INR Continue home Amio Cardiac monitoring  RENAL A:   AKI on CKD Stage IV- Cr 2.3 at [place], BL 1.8-2.0 Mild hyperkalemia Lactic acidosis  Hx renal artery stenosis s/p L renal artery stent in 2007 P:   Repeat bmet & lytes on lasix  GASTROINTESTINAL A:   Elevated LFTs- likely ischemia GI Prophylaxis P:   Pepcid  HEMATOLOGIC A:   Leukocytosis- 11 at [facility] P:  Repeat CBC  INFECTIOUS A:   GPC bacteremia ? HCAP P:   Empiric vanc/zosyn Follow up cultures from [place]  ENDOCRINE A:   Elevated TSH (10 at [facility]), likely due to acute illness Uncontrolled DM-2 P:   Repeat TSH as outpatient Off insulin gtt with lantus 30 daily  NEUROLOGIC A:   Hx CVA P:   RASS goal: -1 Fentanyl and Propofol Can dc after extubation   FAMILY  - Updates: No family available at bedside   - Inter-disciplinary family meet or Palliative Care meeting due by:  05/22/16  Cc time x 71m  Cyril Mourning MD. Tonny Bollman. [place] [facility] Pager [phone] If no response call [phone]    05/16/2016, 9:20 AM

## 2016-05-16 NOTE — Progress Notes (Addendum)
Advanced Heart Failure Rounding Note   Subjective:    Extubated this am. Now off pressors. Feeling much better. No CP or SOB.   CVP 1-2. Co-ox 70%. PCT not drawn.     Objective:   Weight Range:  Vital Signs:   Temp:  [97.6 F (36.4 C)-98.4 F (36.9 C)] 98.4 F (36.9 C) (01/07 0800) Pulse Rate:  [50-70] 69 (01/07 1100) Resp:  [14-18] 14 (01/07 1100) BP: (91-120)/(51-65) 120/54 (01/07 1100) SpO2:  [94 %-97 %] 96 % (01/07 1100) FiO2 (%):  [40 %-50 %] 40 % (01/07 0911) Weight:  [83.2 kg (183 lb 6.8 oz)] 83.2 kg (183 lb 6.8 oz) (01/07 0500) Last BM Date:  (PTA)  Weight change: Filed Weights   05/15/16 0515 05/16/16 0500  Weight: 84 kg (185 lb 3 oz) 83.2 kg (183 lb 6.8 oz)    Intake/Output:   Intake/Output Summary (Last 24 hours) at 05/16/16 1203 Last data filed at 05/16/16 1100  Gross per 24 hour  Intake           900.73 ml  Output             2650 ml  Net         -1749.27 ml     Physical Exam: General:  Lying in bed . No resp difficulty HEENT: normal Neck: supple. JVP flat. RIJ TLC . Carotids 2+ bilat; no bruits. No lymphadenopathy or thryomegaly appreciated. Cor: PMI laterally displaced. Regular rate & rhythm. No rubs, gallops or murmurs. Lungs: mild rhonchi Abdomen: soft, nontender, nondistended. No hepatosplenomegaly. No bruits or masses. Good bowel sounds. Extremities: no cyanosis, clubbing, rash, edema Neuro: alert & orientedx3, cranial nerves grossly intact. moves all 4 extremities w/o difficulty. Affect pleasant  Telemetry: AV paced 70s  Labs: Basic Metabolic Panel:  Recent Labs Lab 05/15/16 0547 05/16/16 0510  NA 134* 138  K 4.8 3.6  CL 99* 101  CO2 21* 25  GLUCOSE 371* 164*  BUN 44* 50*  CREATININE 2.86* 2.79*  CALCIUM 8.5* 9.3  MG 2.0 2.2  PHOS  --  3.3    Liver Function Tests:  Recent Labs Lab 05/15/16 0547  AST 31  ALT 39  ALKPHOS 126  BILITOT 1.6*  PROT 5.6*  ALBUMIN 3.4*   No results for input(s): LIPASE, AMYLASE in  the last 168 hours. No results for input(s): AMMONIA in the last 168 hours.  CBC:  Recent Labs Lab 05/15/16 0547 05/16/16 0510  WBC 10.9* 21.1*  NEUTROABS 10.3*  --   HGB 14.3 14.7  HCT 42.2 43.6  MCV 97.9 97.1  PLT 128* 180    Cardiac Enzymes:  Recent Labs Lab 05/15/16 0547 05/15/16 1028 05/15/16 1824  TROPONINI 0.07* 0.06* 0.06*    BNP: BNP (last 3 results)  Recent Labs  02/04/16 1541 03/09/16 1105 04/20/16 1059  BNP 1,594.5* 713.4* 1,061.4*    ProBNP (last 3 results) No results for input(s): PROBNP in the last 8760 hours.    Other results:  Imaging: Dg Chest Port 1 View  Result Date: 05/15/2016 CLINICAL DATA:  Acute respiratory failure, status post line placement EXAM: PORTABLE CHEST 1 VIEW COMPARISON:  02/27/2016 FINDINGS: Pacing device is again seen and stable. Endotracheal tube, nasogastric catheter and right jugular central line are noted in satisfactory position. No pneumothorax is noted. The lungs are well aerated bilaterally with left retrocardiac consolidation and significant lower lobe infiltrate on the right. Increased perihilar changes are noted likely related to vascular congestion and pulmonary edema.  Small pleural effusions are noted bilaterally. IMPRESSION: Central vascular congestion consistent with CHF and pulmonary edema. Bilateral lower lobe Infiltrates are seen worse on the left than the right. Tubes and lines as described above.  No pneumothorax is noted. Electronically Signed   By: Inez Catalina M.D.   On: 05/15/2016 07:48      Medications:     Scheduled Medications: . amiodarone  200 mg Per Tube Daily  . apixaban  2.5 mg Per Tube BID  . ceFEPime (MAXIPIME) IV  1 g Intravenous Q24H  . chlorhexidine gluconate (MEDLINE KIT)  15 mL Mouth Rinse BID  . famotidine (PEPCID) IV  20 mg Intravenous Daily  . furosemide  80 mg Intravenous Q12H  . insulin aspart  1-3 Units Subcutaneous Q4H  . insulin glargine  30 Units Subcutaneous Q24H  .  mouth rinse  15 mL Mouth Rinse 10 times per day  . sodium chloride flush  10-40 mL Intracatheter Q12H  . vancomycin  750 mg Intravenous Q24H     Infusions: . norepinephrine (LEVOPHED) Adult infusion 5.013 mcg/min (05/16/16 0700)     PRN Medications:  sodium chloride, fentaNYL (SUBLIMAZE) injection, fentaNYL (SUBLIMAZE) injection, sodium chloride flush   Assessment:   1. Septic shock 2. CAP 3. Acute respiratory failure 4. Acute on chronic systolic HF    --Due to NICM EF < 20%. S/p CRT-D (MDT) 5. Acute on chronic renal failure stage IV 6. PAF s/p DC-CV in 10/17   Plan/Discussion:     Given low CVP and normal co-ox suspect main issue is PNA/sepsis. Now extubated and off pressors. CVP 1-2. [place]. No sputum culture avaialble. Continue abx per CCM.   Creatinine remains elevated.  Will hold lasix. Can give gentle hydration if needed. Encourage IS and ambulation.   Continue eliquis 2.5 bid and amiodarone. Will check ESR to make sure no component of amio toxicity. Repeat CXR in am.   Family updated at bedside.    Length of Stay: 1   Bensimhon, Daniel MD 05/16/2016, 12:03 PM  Advanced Heart Failure Team Pager [phone] (M-F; [place])  Please contact [facility] for night-coverage after hours (4p -7a ) and weekends on [email or web address]

## 2016-05-16 NOTE — Progress Notes (Signed)
Dr. Denyce Robert at [facility] called: 2/4 blood cultures came back gram positive cocci.

## 2016-05-17 ENCOUNTER — Inpatient Hospital Stay (HOSPITAL_COMMUNITY): Payer: Medicare Other

## 2016-05-17 DIAGNOSIS — N184 Chronic kidney disease, stage 4 (severe): Secondary | ICD-10-CM

## 2016-05-17 LAB — BASIC METABOLIC PANEL
Anion gap: 8 (ref 5–15)
BUN: 55 mg/dL — AB (ref 6–20)
CO2: 30 mmol/L (ref 22–32)
CREATININE: 2.49 mg/dL — AB (ref 0.61–1.24)
Calcium: 9.1 mg/dL (ref 8.9–10.3)
Chloride: 103 mmol/L (ref 101–111)
GFR calc Af Amer: 26 mL/min — ABNORMAL LOW (ref >60)
GFR, EST NON AFRICAN AMERICAN: 22 mL/min — AB (ref >60)
GLUCOSE: 93 mg/dL (ref 65–99)
POTASSIUM: 3.4 mmol/L — AB (ref 3.5–5.1)
SODIUM: 141 mmol/L (ref 135–145)

## 2016-05-17 LAB — BLOOD GAS, ARTERIAL
ACID-BASE EXCESS: 1 mmol/L (ref 0.0–2.0)
Bicarbonate: 23.7 mmol/L (ref 20.0–28.0)
Drawn by: 42624
FIO2: 0.4
LHR: 16 {breaths}/min
O2 Saturation: 96.8 %
PEEP: 5 cmH2O
Patient temperature: 98.6
VT: 650 mL
pCO2 arterial: 29.2 mmHg — ABNORMAL LOW (ref 32.0–48.0)
pH, Arterial: 7.52 — ABNORMAL HIGH (ref 7.350–7.450)
pO2, Arterial: 83.4 mmHg (ref 83.0–108.0)

## 2016-05-17 LAB — POCT I-STAT 3, ART BLOOD GAS (G3+)
ACID-BASE DEFICIT: 3 mmol/L — AB (ref 0.0–2.0)
Bicarbonate: 22.3 mmol/L (ref 20.0–28.0)
O2 Saturation: 96 %
Patient temperature: 98.6
TCO2: 23 mmol/L (ref 0–100)
pCO2 arterial: 38.3 mmHg (ref 32.0–48.0)
pH, Arterial: 7.373 (ref 7.350–7.450)
pO2, Arterial: 86 mmHg (ref 83.0–108.0)

## 2016-05-17 LAB — PROCALCITONIN: Procalcitonin: 0.66 ng/mL

## 2016-05-17 LAB — CBC
HCT: 42.4 % (ref 39.0–52.0)
Hemoglobin: 13.9 g/dL (ref 13.0–17.0)
MCH: 32.8 pg (ref 26.0–34.0)
MCHC: 32.8 g/dL (ref 30.0–36.0)
MCV: 100 fL (ref 78.0–100.0)
PLATELETS: 143 10*3/uL — AB (ref 150–400)
RBC: 4.24 MIL/uL (ref 4.22–5.81)
RDW: 14.5 % (ref 11.5–15.5)
WBC: 10.7 10*3/uL — ABNORMAL HIGH (ref 4.0–10.5)

## 2016-05-17 LAB — MAGNESIUM: MAGNESIUM: 2.2 mg/dL (ref 1.7–2.4)

## 2016-05-17 LAB — GLUCOSE, CAPILLARY
GLUCOSE-CAPILLARY: 169 mg/dL — AB (ref 65–99)
GLUCOSE-CAPILLARY: 95 mg/dL (ref 65–99)
GLUCOSE-CAPILLARY: 97 mg/dL (ref 65–99)
Glucose-Capillary: 168 mg/dL — ABNORMAL HIGH (ref 65–99)
Glucose-Capillary: 135 mg/dL — ABNORMAL HIGH (ref 65–99)
Glucose-Capillary: 181 mg/dL — ABNORMAL HIGH (ref 65–99)

## 2016-05-17 LAB — SEDIMENTATION RATE: Sed Rate: 15 mm/hr (ref 0–16)

## 2016-05-17 LAB — COOXEMETRY PANEL
Carboxyhemoglobin: 0.9 % (ref 0.5–1.5)
Methemoglobin: 1 % (ref 0.0–1.5)
O2 SAT: 70.3 %
Total hemoglobin: 14 g/dL (ref 12.0–16.0)

## 2016-05-17 LAB — PHOSPHORUS: Phosphorus: 4.2 mg/dL (ref 2.5–4.6)

## 2016-05-17 MED ORDER — APIXABAN 2.5 MG PO TABS
2.5000 mg | ORAL_TABLET | Freq: Two times a day (BID) | ORAL | Status: DC
  Administered 2016-05-17 – 2016-05-21 (×9): 2.5 mg via ORAL
  Filled 2016-05-17 (×9): qty 1

## 2016-05-17 MED ORDER — AMIODARONE HCL 200 MG PO TABS
200.0000 mg | ORAL_TABLET | Freq: Every day | ORAL | Status: DC
  Administered 2016-05-17 – 2016-05-21 (×5): 200 mg via ORAL
  Filled 2016-05-17 (×5): qty 1

## 2016-05-17 MED ORDER — POTASSIUM CHLORIDE CRYS ER 20 MEQ PO TBCR
20.0000 meq | EXTENDED_RELEASE_TABLET | Freq: Once | ORAL | Status: AC
  Administered 2016-05-17: 20 meq via ORAL
  Filled 2016-05-17: qty 1

## 2016-05-17 MED ORDER — FAMOTIDINE 20 MG PO TABS
20.0000 mg | ORAL_TABLET | Freq: Every day | ORAL | Status: DC
  Administered 2016-05-17 – 2016-05-21 (×5): 20 mg via ORAL
  Filled 2016-05-17 (×5): qty 1

## 2016-05-17 MED ORDER — SODIUM CHLORIDE 0.9 % IV BOLUS (SEPSIS)
500.0000 mL | Freq: Once | INTRAVENOUS | Status: AC
  Administered 2016-05-17: 500 mL via INTRAVENOUS

## 2016-05-17 NOTE — Evaluation (Signed)
Physical Therapy Evaluation Patient Details Name: Scott Parsons MRN: [id number] DOB: 1930-03-30 Today's Date: 05/17/2016   History of Present Illness  83yo man with PMHx of chronic systolic CHF (EF 66-29% per last echo here), nonischemic cardiomyopathy, LBBB s/p biV ICD, AFib on Eliquis, COPD, and CKD stage IV who presented to Hospital with shortness of breath in setting of possible PNA/sepsis.  Clinical Impression  Patient demonstrates deficits in functional mobility as indicated below. Will need continued skilled PT to address deficits and maximize function. Will see as indicated and progress as tolerated. At this time, spoke with patient regarding energy conservation and mobility expectations. Encouraged continued mobility with nsg staff. Will plan for continued skilled PT with HHPT and supervision upon discharge, however, if patient progresses quickly, may not need home health. Will follow.    Follow Up Recommendations Home health PT;Supervision/Assistance - 24 hour (pending progress)    Equipment Recommendations  None recommended by PT    Recommendations for Other Services       Precautions / Restrictions Precautions Precautions: Fall Precaution Comments: watch O2 saturations      Mobility  Bed Mobility Overal bed mobility: Needs Assistance Bed Mobility: Supine to Sit     Supine to sit: Min assist     General bed mobility comments: min assist and increased time to come to EOB, cues for positioning  Transfers Overall transfer level: Needs assistance Equipment used: 1 person hand held assist Transfers: Sit to/from Stand Sit to Stand: Min assist         General transfer comment: min assist for stability in power up and static standing  Ambulation/Gait Ambulation/Gait assistance: Min assist Ambulation Distance (Feet): 180 Feet Assistive device: 1 person hand held assist Gait Pattern/deviations: Step-through pattern;Decreased stride length;Drifts right/left;Narrow  base of support Gait velocity: decreased Gait velocity interpretation: Below normal speed for age/gender General Gait Details: patient with rigid and guarded gait. Ambulated on room air with saturations stable but did demonstrate some instability and generalized fatigue.   Stairs            Wheelchair Mobility    Modified Rankin (Stroke Patients Only)       Balance Overall balance assessment: Needs assistance Sitting-balance support: Feet supported Sitting balance-Leahy Scale: Good     Standing balance support: Single extremity supported Standing balance-Leahy Scale: Fair Standing balance comment: Ue assist for stability                             Pertinent Vitals/Pain Pain Assessment: No/denies pain    Home Living Family/patient expects to be discharged to:: Private residence Living Arrangements: Spouse/significant other Available Help at Discharge: Family;Available PRN/intermittently Type of Home: House Home Access: Stairs to enter Entrance Stairs-Rails: None Entrance Stairs-Number of Steps: 2 Home Layout: One level Home Equipment: Cane - single point (2Lo2 at night)      Prior Function Level of Independence: Needs assistance   Gait / Transfers Assistance Needed: independent, limited due to PVD per patient to couple hundred yards.  ADL's / Homemaking Assistance Needed: gets assist with dressing sometimes        Hand Dominance   Dominant Hand: Right    Extremity/Trunk Assessment   Upper Extremity Assessment Upper Extremity Assessment: Overall WFL for tasks assessed    Lower Extremity Assessment Lower Extremity Assessment: Generalized weakness       Communication   Communication: No difficulties  Cognition Arousal/Alertness: Awake/alert Behavior During Therapy: WFL for tasks  assessed/performed Overall Cognitive Status: Within Functional Limits for tasks assessed                      General Comments      Exercises      Assessment/Plan    PT Assessment Patient needs continued PT services  PT Problem List Decreased strength;Decreased balance;Decreased activity tolerance;Decreased mobility;Cardiopulmonary status limiting activity          PT Treatment Interventions DME instruction;Gait training;Stair training;Functional mobility training;Therapeutic activities;Therapeutic exercise;Balance training;Patient/family education    PT Goals (Current goals can be found in the Care Plan section)  Acute Rehab PT Goals Patient Stated Goal: to go home PT Goal Formulation: With patient/family Time For Goal Achievement: 05/31/16 Potential to Achieve Goals: Good    Frequency Min 3X/week   Barriers to discharge        Co-evaluation               End of Session Equipment Utilized During Treatment: Gait belt Activity Tolerance: Patient tolerated treatment well Patient left: in chair;with call bell/phone within reach;with family/visitor present Nurse Communication: Mobility status         Time: 1610-9604 PT Time Calculation (min) (ACUTE ONLY): 24 min   Charges:   PT Evaluation $PT Eval Moderate Complexity: 1 Procedure PT Treatments $Gait Training: 8-22 mins   PT G Codes:        Fabio Asa 2016-05-22, 3:54 PM Charlotte Crumb, PT DPT  [phone]

## 2016-05-17 NOTE — Progress Notes (Signed)
Advanced Heart Failure Rounding Note   Subjective:    Yesterday norepi was off for short time but restarted. Today norepi is at 2 mcg. SBP ok. CO-OX 70%. CVP 2  Denies SOB/CP. CXR has cleared. No fevers   Sed Rate 14  Creatinine 2.79>2.4  Objective:   Weight Range:  Vital Signs:   Temp:  [97.6 F (36.4 C)-98.7 F (37.1 C)] 97.6 F (36.4 C) (01/08 0400) Pulse Rate:  [50-85] 69 (01/08 0600) Resp:  [12-24] 16 (01/08 0600) BP: (95-124)/(32-68) 97/46 (01/08 0600) SpO2:  [93 %-100 %] 95 % (01/08 0600) FiO2 (%):  [40 %] 40 % (01/07 0911) Weight:  [177 lb 11.1 oz (80.6 kg)] 177 lb 11.1 oz (80.6 kg) (01/08 0437) Last BM Date:  (PTA)  Weight change: Filed Weights   05/15/16 0515 05/16/16 0500 05/17/16 0437  Weight: 185 lb 3 oz (84 kg) 183 lb 6.8 oz (83.2 kg) 177 lb 11.1 oz (80.6 kg)    Intake/Output:   Intake/Output Summary (Last 24 hours) at 05/17/16 0733 Last data filed at 05/17/16 0500  Gross per 24 hour  Intake           458.04 ml  Output             1625 ml  Net         -1166.96 ml     Physical Exam: CVP 1-2  General:  Lying in bed . No resp difficulty HEENT: normal Neck: supple. JVP flat. RIJ TLC . Carotids 2+ bilat; no bruits. No lymphadenopathy or thryomegaly appreciated. Cor: PMI laterally displaced. Regular rate & rhythm. No rubs, gallops or murmurs. Lungs:Clear Abdomen: soft, nontender, nondistended. No hepatosplenomegaly. No bruits or masses. Good bowel sounds. Extremities: no cyanosis, clubbing, rash, edema Neuro: alert & orientedx3, cranial nerves grossly intact. moves all 4 extremities w/o difficulty. Affect pleasant  Telemetry: AV paced 70s  Labs: Basic Metabolic Panel:  Recent Labs Lab 05/15/16 0547 05/16/16 0510 05/17/16 0451  NA 134* 138 141  K 4.8 3.6 3.4*  CL 99* 101 103  CO2 21* 25 30  GLUCOSE 371* 164* 93  BUN 44* 50* 55*  CREATININE 2.86* 2.79* 2.49*  CALCIUM 8.5* 9.3 9.1  MG 2.0 2.2 2.2  PHOS  --  3.3 4.2    Liver  Function Tests:  Recent Labs Lab 05/15/16 0547  AST 31  ALT 39  ALKPHOS 126  BILITOT 1.6*  PROT 5.6*  ALBUMIN 3.4*   No results for input(s): LIPASE, AMYLASE in the last 168 hours. No results for input(s): AMMONIA in the last 168 hours.  CBC:  Recent Labs Lab 05/15/16 0547 05/16/16 0510 05/17/16 0451  WBC 10.9* 21.1* 10.7*  NEUTROABS 10.3*  --   --   HGB 14.3 14.7 13.9  HCT 42.2 43.6 42.4  MCV 97.9 97.1 100.0  PLT 128* 180 143*    Cardiac Enzymes:  Recent Labs Lab 05/15/16 0547 05/15/16 1028 05/15/16 1824  TROPONINI 0.07* 0.06* 0.06*    BNP: BNP (last 3 results)  Recent Labs  02/04/16 1541 03/09/16 1105 04/20/16 1059  BNP 1,594.5* 713.4* 1,061.4*    ProBNP (last 3 results) No results for input(s): PROBNP in the last 8760 hours.    Other results:  Imaging: Dg Chest Port 1 View  Result Date: 05/17/2016 CLINICAL DATA:  Pneumonia. EXAM: PORTABLE CHEST 1 VIEW COMPARISON:  05/15/2016. FINDINGS: Interim extubation and removal of NG tube. Right IJ line stable position. AICD in stable position. Stable cardiomegaly. Significant clearing  of bibasilar pulmonary infiltrates. COPD. Biapical pleural thickening noted consistent with scarring. No prominent pleural effusion. No pneumothorax. IMPRESSION: 1. Interim extubation removal of NG tube. 2. Significant clearing of bibasilar infiltrates. 3. AICD in stable position.  Stable heart size. Electronically Signed   By: Marcello Moores  Register   On: 05/17/2016 07:00   Dg Chest Port 1 View  Result Date: 05/15/2016 CLINICAL DATA:  Acute respiratory failure, status post line placement EXAM: PORTABLE CHEST 1 VIEW COMPARISON:  02/27/2016 FINDINGS: Pacing device is again seen and stable. Endotracheal tube, nasogastric catheter and right jugular central line are noted in satisfactory position. No pneumothorax is noted. The lungs are well aerated bilaterally with left retrocardiac consolidation and significant lower lobe infiltrate on the  right. Increased perihilar changes are noted likely related to vascular congestion and pulmonary edema. Small pleural effusions are noted bilaterally. IMPRESSION: Central vascular congestion consistent with CHF and pulmonary edema. Bilateral lower lobe Infiltrates are seen worse on the left than the right. Tubes and lines as described above.  No pneumothorax is noted. Electronically Signed   By: Inez Catalina M.D.   On: 05/15/2016 07:48     Medications:     Scheduled Medications: . amiodarone  200 mg Per Tube Daily  . apixaban  2.5 mg Per Tube BID  . ceFEPime (MAXIPIME) IV  1 g Intravenous Q24H  . famotidine (PEPCID) IV  20 mg Intravenous Daily  . insulin aspart  1-3 Units Subcutaneous Q4H  . insulin glargine  30 Units Subcutaneous Q24H  . mouth rinse  15 mL Mouth Rinse BID  . sodium chloride flush  10-40 mL Intracatheter Q12H  . vancomycin  750 mg Intravenous Q24H    Infusions: . norepinephrine (LEVOPHED) Adult infusion 3 mcg/min (05/17/16 0049)    PRN Medications: sodium chloride, fentaNYL (SUBLIMAZE) injection, fentaNYL (SUBLIMAZE) injection, sodium chloride flush, zolpidem   Assessment:   1. Septic shock 2. CAP 3. Acute respiratory failure 4. Acute on chronic systolic HF    --Due to NICM EF < 20%. S/p CRT-D (MDT) 5. Acute on chronic renal failure stage IV 6. PAF s/p DC-CV in 10/17   Plan/Discussion:   Suspect main issue is PNA/sepsis. [place]. No sputum culture avaialble. Continue abx per CCM.   Todays CO-OX 70%. On norepi 2 mcg. Plan to wean to 1 mcg then hopefully off if BP ok. May need to add midodrine if unable to wean. CVP low. Give 500 cc NS now. Holding diuretics. Unable to start bb.   Creatinine trending down.  Will hold lasix. Can give gentle hydration if needed. Encourage IS and ambulation.   Continue eliquis 2.5 bid and amiodarone 200 daily.  ESR 15.  CXR improving.    OOB.     Length of Stay: 2   Amy Clegg NP-C  05/17/2016, 7:33 AM  Advanced Heart  Failure Team Pager [phone] (M-F; 7a - 4p)  Please contact [facility] for night-coverage after hours (4p -7a ) and weekends on [email or web address]  Patient seen and examined with Darrick Grinder, NP. We discussed all aspects of the encounter. I agree with the assessment and plan as stated above.   Overall much improved. Inciting event remains unclear whether or not he had transient low output HF/hypotension leading to AKI and lactic acidosis or if this was more of a septic picture. Currently CXR has cleared and co-ox is normal so treating for sepsis but I suspect it may have been transient low output HF.   CVP low today and  BP down. Will give IVF to help wean norepi. Maintaining NSR on amio. ESR low so doubt component of amio toxicity. Renal function improving.   Will ambulate.   Phillis Thackeray,MD 6:24 PM

## 2016-05-17 NOTE — Progress Notes (Signed)
PULMONARY / CRITICAL CARE MEDICINE   Name: Scott Parsons MRN: [id number] DOB: [birth year]    ADMISSION DATE:  05/15/16 CONSULTATION DATE:  05/15/16  REFERRING MD:  [facility]  CHIEF COMPLAINT:  Shortness of breath  HISTORY OF PRESENT ILLNESS:   Scott Parsons is a 83yo man with PMHx of chronic systolic CHF (EF 40-98% per last echo here), nonischemic cardiomyopathy, LBBB s/p biV ICD, AFib on Eliquis, COPD, and CKD stage IV who presented to [facility] with shortness of breath. He developed SOB and had taken an extra dose of Lasix 60 mg at home, but remained symptomatic so went to the hospital. At [facility] he was placed on BiPAP but oxygen saturation remained in the 70s and thus he was intubated. He received Lasix 80 mg IV, Solu-medrol with concern for possible COPD playing a role in his SOB, and broad spectrum antibiotics. His initial ABG revealed pH 7.28/pCO2 53/pO2 60 and after intubation improved to pH 7.34/pCO2 45/pO2 87. He was hypotensive so Levophed and Dobutamine had been initiated. He was then transferred to [facility] for further care.   SUBJECTIVE:  Extubated 1/7. Doing well this morning. Denies chest pain or dyspnea.  VITAL SIGNS: BP (!) 115/47 (BP Location: Left Arm)   Pulse 70   Temp 97.5 F (36.4 C) (Oral)   Resp 12   Ht 6\' 2"  (1.88 m)   Wt 177 lb 11.1 oz (80.6 kg)   SpO2 98%   BMI 22.81 kg/m   HEMODYNAMICS: CVP:  [2 mmHg] 2 mmHg  INTAKE / OUTPUT: I/O last 3 completed shifts: In: 965.1 [I.V.:445.1; NG/GT:70; IV Piggyback:450] Out: 3700 [Urine:3700]  PHYSICAL EXAMINATION: General:  NAD Neuro: Awake, answering questions appropriately, no focal deficits HEENT:  [place]/AT, PERRL Cardiovascular: RRR, no m/g/r Lungs: CTA bilaterally, mildly decreased breath sounds at right lung base Abdomen: BS+, soft, non-tender Musculoskeletal: no peripheral edema Skin:  Warm, dry  LABS:  BMET  Recent Labs Lab 05/15/16 0547 05/16/16 0510 05/17/16 0451  NA  134* 138 141  K 4.8 3.6 3.4*  CL 99* 101 103  CO2 21* 25 30  BUN 44* 50* 55*  CREATININE 2.86* 2.79* 2.49*  GLUCOSE 371* 164* 93    Electrolytes  Recent Labs Lab 05/15/16 0547 05/16/16 0510 05/17/16 0451  CALCIUM 8.5* 9.3 9.1  MG 2.0 2.2 2.2  PHOS  --  3.3 4.2    CBC  Recent Labs Lab 05/15/16 0547 05/16/16 0510 05/17/16 0451  WBC 10.9* 21.1* 10.7*  HGB 14.3 14.7 13.9  HCT 42.2 43.6 42.4  PLT 128* 180 143*    Coag's  Recent Labs Lab 05/15/16 0547  INR 1.07    Sepsis Markers  Recent Labs Lab 05/15/16 0547 05/15/16 1027  LATICACIDVEN 2.6* 3.2*    ABG  Recent Labs Lab 05/15/16 0543 05/16/16 0440  PHART 7.373 7.520*  PCO2ART 38.3 29.2*  PO2ART 86.0 83.4    Liver Enzymes  Recent Labs Lab 05/15/16 0547  AST 31  ALT 39  ALKPHOS 126  BILITOT 1.6*  ALBUMIN 3.4*    Cardiac Enzymes  Recent Labs Lab 05/15/16 0547 05/15/16 1028 05/15/16 1824  TROPONINI 0.07* 0.06* 0.06*    Glucose  Recent Labs Lab 05/16/16 1209 05/16/16 1611 05/16/16 1954 05/16/16 2338 05/17/16 0432 05/17/16 0758  GLUCAP 140* 138* 161* 169* 97 95    Imaging Dg Chest Port 1 View  Result Date: 05/17/2016 CLINICAL DATA:  Pneumonia. EXAM: PORTABLE CHEST 1 VIEW COMPARISON:  05/15/2016. FINDINGS: Interim extubation and removal  of NG tube. Right IJ line stable position. AICD in stable position. Stable cardiomegaly. Significant clearing of bibasilar pulmonary infiltrates. COPD. Biapical pleural thickening noted consistent with scarring. No prominent pleural effusion. No pneumothorax. IMPRESSION: 1. Interim extubation removal of NG tube. 2. Significant clearing of bibasilar infiltrates. 3. AICD in stable position.  Stable heart size. Electronically Signed   By: Maisie Fus  Register   On: 05/17/2016 07:00     STUDIES:  TEE 02/2016 EF 15%  CULTURES: Blood cx 1/6>> [place] > GPCs 2/2 >> staph epidermidis sensitive to cipro, clinda, gent, levofloxacin, linezolid, oxacillin,  quinupristin-dalfopristin, rifampin, tetracycline, vanc. resistent to pcn and erythomycin Blood cx 1/6 here>> NGTD Urine cx 1/6>> no growth  ANTIBIOTICS: Vanc 1/6>> Cefepime 1/6>> 1/8  SIGNIFICANT EVENTS: 1/5>> Presented to [facility] with acute respiratory failure requiring intubation  1/6>> Transferred to [facility] for further management   LINES/TUBES: ETT 1/5>> 1/7  DISCUSSION: Scott Parsons is a 83yo man with PMHx of chronic systolic CHF, COPD, AFib, NICM s/p biV ICD, and CKD stage IV presenting with acute hypoxic respiratory failure requiring intubation likely secondary to acute CHF exacerbation.  ASSESSMENT / PLAN:  PULMONARY A: Acute hypoxic respiratory failure likely due to CHF exacerbation vs CAP>>Resolving COPD, possibly playing role OSA P:   Wean supplemental O2 as tolerated  CARDIOVASCULAR A:  Likely acute on chronic systolic CHF exacerbation (BNP 12,000 at [facility] but not overtly overloaded, low output HF?) Hypotension requiring dobutamine and levophed initially>> Resolved Nonischemic CMO  Persistent AFib Hx HTN Hyperlipidemia Known LBBB s/p biV ICD PVD P:  Cardiology following Eliquis Continue home Amio  RENAL A:   AKI on CKD Stage IV- Cr 2.3 at [place], BL 1.8-2.0 Mild hyperkalemia Lactic acidosis  Hx renal artery stenosis s/p L renal artery stent in 2007 P:   Follow BMP  GASTROINTESTINAL A:   Elevated LFTs- likely ischemia P:   Pepcid  HEMATOLOGIC A:   Leukocytosis- 11 at [facility] >> resolving P:  Repeat CBC  INFECTIOUS A:   Staph epidermidis bacteremia 2/2 bottles P:   vanc to complete 7 day course  ENDOCRINE A:   Elevated TSH (10 at [facility]), likely due to acute illness Uncontrolled DM-2 P:   Repeat TSH as outpatient Off insulin gtt with lantus 30 daily  NEUROLOGIC A:   Hx CVA   FAMILY  - Updates: Updated patient 1/8  - Inter-disciplinary family meet or Palliative Care meeting due by:  05/22/16  Griffin Basil, MD   Internal Medicine PGY-3 Pager: [phone] After 3PM, call [phone]  05/17/2016, 10:34 AM

## 2016-05-18 ENCOUNTER — Inpatient Hospital Stay (HOSPITAL_COMMUNITY): Admission: RE | Admit: 2016-05-18 | Payer: Medicare Other | Source: Ambulatory Visit

## 2016-05-18 ENCOUNTER — Inpatient Hospital Stay (HOSPITAL_COMMUNITY): Payer: Medicare Other

## 2016-05-18 DIAGNOSIS — G4733 Obstructive sleep apnea (adult) (pediatric): Secondary | ICD-10-CM

## 2016-05-18 DIAGNOSIS — I447 Left bundle-branch block, unspecified: Secondary | ICD-10-CM

## 2016-05-18 DIAGNOSIS — IMO0002 Reserved for concepts with insufficient information to code with codable children: Secondary | ICD-10-CM

## 2016-05-18 DIAGNOSIS — R7881 Bacteremia: Secondary | ICD-10-CM

## 2016-05-18 DIAGNOSIS — Z95810 Presence of automatic (implantable) cardiac defibrillator: Secondary | ICD-10-CM

## 2016-05-18 DIAGNOSIS — R6521 Severe sepsis with septic shock: Secondary | ICD-10-CM

## 2016-05-18 DIAGNOSIS — E118 Type 2 diabetes mellitus with unspecified complications: Secondary | ICD-10-CM

## 2016-05-18 DIAGNOSIS — N170 Acute kidney failure with tubular necrosis: Secondary | ICD-10-CM

## 2016-05-18 DIAGNOSIS — I4819 Other persistent atrial fibrillation: Secondary | ICD-10-CM

## 2016-05-18 DIAGNOSIS — E1165 Type 2 diabetes mellitus with hyperglycemia: Secondary | ICD-10-CM

## 2016-05-18 DIAGNOSIS — J449 Chronic obstructive pulmonary disease, unspecified: Secondary | ICD-10-CM

## 2016-05-18 DIAGNOSIS — N184 Chronic kidney disease, stage 4 (severe): Secondary | ICD-10-CM

## 2016-05-18 DIAGNOSIS — I10 Essential (primary) hypertension: Secondary | ICD-10-CM

## 2016-05-18 LAB — BASIC METABOLIC PANEL
Anion gap: 7 (ref 5–15)
BUN: 46 mg/dL — AB (ref 6–20)
CO2: 29 mmol/L (ref 22–32)
CREATININE: 1.9 mg/dL — AB (ref 0.61–1.24)
Calcium: 8.9 mg/dL (ref 8.9–10.3)
Chloride: 107 mmol/L (ref 101–111)
GFR calc Af Amer: 36 mL/min — ABNORMAL LOW (ref >60)
GFR, EST NON AFRICAN AMERICAN: 31 mL/min — AB (ref >60)
GLUCOSE: 100 mg/dL — AB (ref 65–99)
Potassium: 3.4 mmol/L — ABNORMAL LOW (ref 3.5–5.1)
SODIUM: 143 mmol/L (ref 135–145)

## 2016-05-18 LAB — COOXEMETRY PANEL
Carboxyhemoglobin: 1.2 % (ref 0.5–1.5)
Methemoglobin: 1.1 % (ref 0.0–1.5)
O2 Saturation: 67.3 %
Total hemoglobin: 13 g/dL (ref 12.0–16.0)

## 2016-05-18 LAB — GLUCOSE, CAPILLARY
GLUCOSE-CAPILLARY: 115 mg/dL — AB (ref 65–99)
GLUCOSE-CAPILLARY: 160 mg/dL — AB (ref 65–99)
GLUCOSE-CAPILLARY: 98 mg/dL (ref 65–99)
Glucose-Capillary: 100 mg/dL — ABNORMAL HIGH (ref 65–99)
Glucose-Capillary: 132 mg/dL — ABNORMAL HIGH (ref 65–99)
Glucose-Capillary: 179 mg/dL — ABNORMAL HIGH (ref 65–99)

## 2016-05-18 LAB — PROCALCITONIN: Procalcitonin: 0.26 ng/mL

## 2016-05-18 LAB — CBC
HCT: 38.8 % — ABNORMAL LOW (ref 39.0–52.0)
Hemoglobin: 12.7 g/dL — ABNORMAL LOW (ref 13.0–17.0)
MCH: 32.7 pg (ref 26.0–34.0)
MCHC: 32.7 g/dL (ref 30.0–36.0)
MCV: 100 fL (ref 78.0–100.0)
PLATELETS: 115 10*3/uL — AB (ref 150–400)
RBC: 3.88 MIL/uL — AB (ref 4.22–5.81)
RDW: 14.4 % (ref 11.5–15.5)
WBC: 6.2 10*3/uL (ref 4.0–10.5)

## 2016-05-18 LAB — ECHOCARDIOGRAM COMPLETE
Height: 74 in
WEIGHTICAEL: 2839.52 [oz_av]

## 2016-05-18 LAB — PHOSPHORUS: Phosphorus: 2.8 mg/dL (ref 2.5–4.6)

## 2016-05-18 LAB — MAGNESIUM: MAGNESIUM: 2.1 mg/dL (ref 1.7–2.4)

## 2016-05-18 LAB — VANCOMYCIN, TROUGH: Vancomycin Tr: 14 ug/mL — ABNORMAL LOW (ref 15–20)

## 2016-05-18 MED ORDER — ALLOPURINOL 100 MG PO TABS
100.0000 mg | ORAL_TABLET | Freq: Every day | ORAL | Status: DC
  Administered 2016-05-19 – 2016-05-21 (×3): 100 mg via ORAL
  Filled 2016-05-18 (×3): qty 1

## 2016-05-18 MED ORDER — SODIUM CHLORIDE 0.9 % IV BOLUS (SEPSIS)
1000.0000 mL | Freq: Once | INTRAVENOUS | Status: AC
  Administered 2016-05-18: 1000 mL via INTRAVENOUS

## 2016-05-18 MED ORDER — ATORVASTATIN CALCIUM 20 MG PO TABS
20.0000 mg | ORAL_TABLET | Freq: Every day | ORAL | Status: DC
  Administered 2016-05-18 – 2016-05-20 (×3): 20 mg via ORAL
  Filled 2016-05-18 (×3): qty 1

## 2016-05-18 MED ORDER — POTASSIUM CHLORIDE CRYS ER 20 MEQ PO TBCR
40.0000 meq | EXTENDED_RELEASE_TABLET | Freq: Once | ORAL | Status: AC
  Administered 2016-05-18: 40 meq via ORAL
  Filled 2016-05-18: qty 2

## 2016-05-18 MED ORDER — GABAPENTIN 300 MG PO CAPS
300.0000 mg | ORAL_CAPSULE | Freq: Every day | ORAL | Status: DC
  Administered 2016-05-18 – 2016-05-20 (×3): 300 mg via ORAL
  Filled 2016-05-18 (×3): qty 1

## 2016-05-18 MED ORDER — VANCOMYCIN HCL IN DEXTROSE 1-5 GM/200ML-% IV SOLN
1000.0000 mg | INTRAVENOUS | Status: DC
  Administered 2016-05-19 – 2016-05-20 (×2): 1000 mg via INTRAVENOUS
  Filled 2016-05-18 (×3): qty 200

## 2016-05-18 MED ORDER — ENSURE ENLIVE PO LIQD
237.0000 mL | Freq: Two times a day (BID) | ORAL | Status: DC
  Administered 2016-05-18 – 2016-05-21 (×6): 237 mL via ORAL

## 2016-05-18 NOTE — Care Management Note (Addendum)
Case Management Note  Patient Details  Name: Scott Parsons MRN: [id number] Date of Birth: June 30, 1929  Subjective/Objective:        Adm w rep failure, chf            Action/Plan:to return home w hhc. Lives w wife, pcp dr [name]   Expected Discharge Date:  05/19/16               Expected Discharge Plan:  Home w Home Health Services  In-House Referral:     Discharge planning Services  CM Consult  Post Acute Care Choice:  Home Health Choice offered to:  Patient  DME Arranged:    DME Agency:     HH Arranged:  RN, Disease Management, Physical Therapy HH Agency:  [facility]  Status of Service:  Completed, signed off  If discussed at Long Length of Stay Meetings, dates discussed:    Additional Comments:md order for hhrn. Pt lives in [place] . He wanted to use [facility] [phone] and fax [phone]. Faxed order and spoke w diane at [facility] in [place]. Poss dc on wed/thurs per pt.  Hanley Hays, RN 05/18/2016, 9:38 AM

## 2016-05-18 NOTE — Progress Notes (Signed)
Nutrition Follow-up  INTERVENTION:   Ensure Enlive po BID, each supplement provides 350 kcal and 20 grams of protein  Reviewed low sodium guidelines, how to achieve 1500 mg sodium per day, how many mg sodium in table salt.   NUTRITION DIAGNOSIS:   Inadequate oral intake related to  (decreased appetite) as evidenced by per patient/family report, meal completion < 50%. Ongoing.   GOAL:   Patient will meet greater than or equal to 90% of their needs Progressing.   MONITOR:   Skin, PO intake, Supplement acceptance, I & O's  ASSESSMENT:   83yo man with PMHx of chronic systolic CHF, COPD, AFib, NICM s/p biV ICD, and CKD stage IV presenting with acute hypoxic respiratory failure requiring intubation likely secondary to acute CHF exacerbation  Pt reports that he usually eats 3 meals per day. He cooks breakfast (eggs, hash browns), sandwich for lunch (usually peanut butter), dinner wife cooks (grilled chicken). She cooks without salt and he adds mrs dash. Pt writes all of his food down and monitors his sodium intake carefully. Per pt MD feels he is restricting too much. Per pt he is limiting his sodium intake to 1000 mg per day. Pt usually has a good appetite but has not had a great appetite today, lunch untouched on bedside table. He does drink vanilla ensure at home and is willing to drink some here.  Weight has been stable for the past year and varies between 177 lb and 180 lb. Per MD note pt is on the dry side at 177 lb, fluid bolus ordered. Pt is negative 3 L since admission.   CBG's: 98-100-160 on lantus and novolog Meal Completion: 85-100% - did not eat lunch today Nutrition-Focused physical exam completed. Findings are no fat depletion, no muscle depletion, and no edema.   Diet Order:  Diet Heart Room service appropriate? Yes; Fluid consistency: Thin  Skin:  Wound (see comment) (MASD buttocks, stage II buttock)  Last BM:  1/8  Height:   Ht Readings from Last 1 Encounters:   05/15/16 6\' 2"  (1.88 m)    Weight:   Wt Readings from Last 1 Encounters:  05/18/16 177 lb 7.5 oz (80.5 kg)    Ideal Body Weight:  86.3 kg  BMI:  Body mass index is 22.79 kg/m.  Estimated Nutritional Needs:   Kcal:  2100-2300  Protein:  100-115 grams  Fluid:  2 L/day  EDUCATION NEEDS:   Education needs addressed  Kendell Bane RD, LDN, CNSC [phone] Pager [phone] After Hours Pager

## 2016-05-18 NOTE — Progress Notes (Signed)
Pharmacy Antibiotic Note  Scott Parsons is a 83 y.o. male admitted on 05/15/2016 with sepsis with staph epi in blood culture at [facility].  Pharmacy has been consulted for vancomycin dosing.   VT 14 on 750 mg IV every 24 hrs prior to 4th dose. SCr is steadily improving. UOP has been stable, unsure if charting complete today.   Plan: Increase vancomycin to 1g IV every 24hrs.  Monitor renal function, culture results, and clinical status. Follow-up UOP and dose adjust if decreasing.    Height: 6\' 2"  (188 cm) Weight: 177 lb 7.5 oz (80.5 kg) IBW/kg (Calculated) : 82.2  Temp (24hrs), Avg:97.9 F (36.6 C), Min:97.6 F (36.4 C), Max:98.6 F (37 C)   Recent Labs Lab 05/15/16 0547 05/15/16 1027 05/16/16 0510 05/17/16 0451 05/18/16 0545 05/18/16 2100  WBC 10.9*  --  21.1* 10.7* 6.2  --   CREATININE 2.86*  --  2.79* 2.49* 1.90*  --   LATICACIDVEN 2.6* 3.2*  --   --   --   --   VANCOTROUGH  --   --   --   --   --  14*    Estimated Creatinine Clearance: 33 mL/min (by C-G formula based on SCr of 1.9 mg/dL (H)).    Allergies  Allergen Reactions  . Amoxicillin Itching       . Biaxin [Clarithromycin] Itching and Swelling    Lip swelling     . Erythromycin Itching  . Penicillins Hives and Itching    Has patient had a PCN reaction causing immediate rash, facial/tongue/throat swelling, SOB or lightheadedness with hypotension: Yes Has patient had a PCN reaction causing severe rash involving mucus membranes or skin necrosis: No Has patient had a PCN reaction that required hospitalization No Has patient had a PCN reaction occurring within the last 10 years: No If all of the above answers are "NO", then may proceed with Cephalosporin use.  . Shellfish Allergy Itching        . Sulfa Antibiotics Other (See Comments)    AKI and hyperkalemia    Antimicrobials this admission:  1/6 Vancomycin >> 1/5 Cefepime >>1/8  Dose adjustments this admission:  1/9 VT = 14 on 750 mg IV  every 24 hrs.   Microbiology results:  1/6 BCx: ngtd 1/6 UCx: NGF 1/6 MRSA PCR: neg **FYI - 2/4 blood cx from [facility] hosp + for GPC>staph epi  Thank you for allowing pharmacy to be a part of this patient's care.  Link Snuffer, PharmD, BCPS Clinical Pharmacist Clinical Phone 05/18/2016 until 11 PM [phone] After hours, please call #[phone] 05/18/2016 10:30 PM

## 2016-05-18 NOTE — Progress Notes (Signed)
PROGRESS NOTE    Scott Parsons  ZOX:[id number] DOB: 1929/09/05 DOA: 05/15/2016 PCP: [name], MD   Brief Narrative:  84yo WM PMHx CVA, Chronic Systolic CHF (EF 81-19% per last echo here), Nonischemic Cardiomyopathy, LBBB S/P biV ICD, A-Fib on Eliquis, CAD native artery, COPD, OSA, and CKD stage IV, Renal artery stenosis   Who presented to [facility] with shortness of breath. He developed SOB and had taken an extra dose of Lasix 60 mg at home, but remained symptomatic so went to the hospital. At [facility] he was placed on BiPAP but oxygen saturation remained in the 70s and thus he was intubated. He received Lasix 80 mg IV, Solu-medrol with concern for possible COPD playing a role in his SOB, and broad spectrum antibiotics. His initial ABG revealed pH 7.28/pCO2 53/pO2 60 and after intubation improved to pH 7.34/pCO2 45/pO2 87. He was hypotensive so Levophed and Dobutamine had been initiated. He was then transferred to [facility] for further care.    Subjective: 1/9  A/O 4, NAD, lying in bed comfortably speaking with his family.   Assessment & Plan:   Principal Problem:   Acute respiratory failure with hypoxia (HCC) Active Problems:   Peripheral arterial disease (HCC)   Nonischemic cardiomyopathy (HCC)   ICD (implantable cardioverter-defibrillator), dual, in situ   Paroxysmal a-fib (HCC)   Pressure injury of skin   Acute on chronic renal failure (HCC)   Cardiogenic shock (HCC)   Acute hypoxemic respiratory failure (HCC)   Acute hypoxic respiratory failure likely due to CHF exacerbation vs CAP -Resolved>>Resolving  COPD  -titrate O2 to maintain SPO2 89 to 93%   OSA -CPAP per respiratory  Acute on Chronic Systolic and Diastolic CHF Exacerbation/ Nonischemic CMO  -(BNP 12,000 at [facility] but not overtly overloaded, low output HF?) -Hypotension requiring dobutamine and levophed initially>> Resolved -Management per cardiology  Persistent AFib -Currently in  NSR -Amiodarone 200 mg daily -Eliquis  HTN -Borderline hypotensive   LBBB  -s/p biV ICD  PVD  HLD   Acute renal failure on CKD Stage IV (baseline Cr 1.8 to 2.0)   - Cr 2.3 at [facility],  Lab Results  Component Value Date   CREATININE 1.90 (H) 05/18/2016   CREATININE 2.49 (H) 05/17/2016   CREATININE 2.79 (H) 05/16/2016  -At baseline  Hx renal artery stenosis s/p L renal artery stent in 2007   Staph epidermidis bacteremia 2/2 bottles -Complete course vancomycin 7 day course  Hypothyroidism -Elevated TSH (10 at [facility]), likely due to acute illness -Obtain free T4 -Hold starting Synthroid  DM type II controlled with complication -1/6 Hemoglobin A1c= 6.9 -Lantus 30 units daily -Customized SSI   Hypokalemia -Potassium goal> 4 -K-Dur 40 mEq       DVT prophylaxis: Eliquis Code Status: Full Family Communication: Family at bedside Disposition Plan: Per cardiology   Consultants:  Cardiology PC CM  Procedures/Significant Events:  02/2016 TEE : = LVEF 15% 1/5>> Presented to [facility] with acute respiratory failure requiring intubation  1/6>> Transferred to [facility] for further management  1/19 Echogardiogram; Left ventricle: severely dilated. LVEF =15-20%. Diffuse hypokinesis.  -(grade 2 diastolic  dysfunction).  - Mitral valve: There was mild to moderate regurgitation.   VENTILATOR SETTINGS: NA   Cultures 1/6 Blood cx>> [place] >  positive GPCs 2/2 >> staph epidermidis sensitive to cipro, clinda, gent, levofloxacin, linezolid, oxacillin, quinupristin-dalfopristin, rifampin, tetracycline, vanc. resistent to pcn and erythomycin 1/6 Blood cx here>> NGTD 1/6 Urine cx >> no growth   Antimicrobials: Anti-infectives  Start     Stop   05/15/16 2200  vancomycin (VANCOCIN) IVPB 750 mg/150 ml premix         05/15/16 2100  ceFEPIme (MAXIPIME) 1 g in dextrose 5 % 50 mL IVPB  Status:  Discontinued     05/17/16 1224       Devices NA   LINES /  TUBES:  NA    Continuous Infusions:   Objective: Vitals:   05/18/16 1200 05/18/16 1300 05/18/16 1400 05/18/16 1646  BP: 104/90 (!) 117/51 (!) 116/48   Pulse: 71 71 69   Resp: (!) 24 (!) 21 (!) 27   Temp:    97.7 F (36.5 C)  TempSrc:    Oral  SpO2: 96% 99% 100%   Weight:      Height:        Intake/Output Summary (Last 24 hours) at 05/18/16 1707 Last data filed at 05/18/16 1646  Gross per 24 hour  Intake          1255.48 ml  Output             1235 ml  Net            20.48 ml   Filed Weights   05/16/16 0500 05/17/16 0437 05/18/16 0400  Weight: 83.2 kg (183 lb 6.8 oz) 80.6 kg (177 lb 11.1 oz) 80.5 kg (177 lb 7.5 oz)    Examination:  General: A/O 4, NAD, No acute respiratory distress Eyes: negative scleral hemorrhage, negative anisocoria, negative icterus ENT: Negative Runny nose, negative gingival bleeding, Neck:  Negative scars, masses, torticollis, lymphadenopathy, JVD, right IJ covered and clean negative sign of infection Lungs: Clear to auscultation bilaterally without wheezes or crackles Cardiovascular: Regular rate and rhythm without murmur gallop or rub normal S1 and S2 Abdomen: negative abdominal pain, nondistended, positive soft, bowel sounds, no rebound, no ascites, no appreciable mass Extremities: No significant cyanosis, clubbing, or edema bilateral lower extremities Skin: Negative rashes, lesions, ulcers Psychiatric:  Negative depression, negative anxiety, negative fatigue, negative mania  Central nervous system:  Cranial nerves II through XII intact, tongue/uvula midline, all extremities muscle strength 5/5, sensation intact throughout, negative dysarthria, negative expressive aphasia, negative receptive aphasia.  .     Data Reviewed: Care during the described time interval was provided by me .  I have reviewed this patient's available data, including medical history, events of note, physical examination, and all test results as part of my evaluation.  I have personally reviewed and interpreted all radiology studies.  CBC:  Recent Labs Lab 05/15/16 0547 05/16/16 0510 05/17/16 0451 05/18/16 0545  WBC 10.9* 21.1* 10.7* 6.2  NEUTROABS 10.3*  --   --   --   HGB 14.3 14.7 13.9 12.7*  HCT 42.2 43.6 42.4 38.8*  MCV 97.9 97.1 100.0 100.0  PLT 128* 180 143* 115*   Basic Metabolic Panel:  Recent Labs Lab 05/15/16 0547 05/16/16 0510 05/17/16 0451 05/18/16 0545  NA 134* 138 141 143  K 4.8 3.6 3.4* 3.4*  CL 99* 101 103 107  CO2 21* 25 30 29   GLUCOSE 371* 164* 93 100*  BUN 44* 50* 55* 46*  CREATININE 2.86* 2.79* 2.49* 1.90*  CALCIUM 8.5* 9.3 9.1 8.9  MG 2.0 2.2 2.2 2.1  PHOS  --  3.3 4.2 2.8   GFR: Estimated Creatinine Clearance: 33 mL/min (by C-G formula based on SCr of 1.9 mg/dL (H)). Liver Function Tests:  Recent Labs Lab 05/15/16 0547  AST 31  ALT 39  ALKPHOS 126  BILITOT 1.6*  PROT 5.6*  ALBUMIN 3.4*   No results for input(s): LIPASE, AMYLASE in the last 168 hours. No results for input(s): AMMONIA in the last 168 hours. Coagulation Profile:  Recent Labs Lab 05/15/16 0547  INR 1.07   Cardiac Enzymes:  Recent Labs Lab 05/15/16 0547 05/15/16 1028 05/15/16 1824  TROPONINI 0.07* 0.06* 0.06*   BNP (last 3 results) No results for input(s): PROBNP in the last 8760 hours. HbA1C: No results for input(s): HGBA1C in the last 72 hours. CBG:  Recent Labs Lab 05/18/16 0031 05/18/16 0437 05/18/16 0825 05/18/16 1149 05/18/16 1648  GLUCAP 115* 98 100* 160* 132*   Lipid Profile: No results for input(s): CHOL, HDL, LDLCALC, TRIG, CHOLHDL, LDLDIRECT in the last 72 hours. Thyroid Function Tests: No results for input(s): TSH, T4TOTAL, FREET4, T3FREE, THYROIDAB in the last 72 hours. Anemia Panel: No results for input(s): VITAMINB12, FOLATE, FERRITIN, TIBC, IRON, RETICCTPCT in the last 72 hours. Urine analysis:    Component Value Date/Time   COLORURINE YELLOW 05/15/2016 0550   APPEARANCEUR CLEAR 05/15/2016  0550   LABSPEC 1.008 05/15/2016 0550   PHURINE 5.0 05/15/2016 0550   GLUCOSEU >=500 (A) 05/15/2016 0550   HGBUR LARGE (A) 05/15/2016 0550   BILIRUBINUR NEGATIVE 05/15/2016 0550   KETONESUR NEGATIVE 05/15/2016 0550   PROTEINUR NEGATIVE 05/15/2016 0550   NITRITE NEGATIVE 05/15/2016 0550   LEUKOCYTESUR NEGATIVE 05/15/2016 0550   Sepsis Labs: @LABRCNTIP (procalcitonin:4,lacticidven:4)  ) Recent Results (from the past 240 hour(s))  MRSA PCR Screening     Status: None   Collection Time: 05/15/16  5:39 AM  Result Value Ref Range Status   MRSA by PCR NEGATIVE NEGATIVE Final    Comment:        The GeneXpert MRSA Assay (FDA approved for NASAL specimens only), is one component of a comprehensive MRSA colonization surveillance program. It is not intended to diagnose MRSA infection nor to guide or monitor treatment for MRSA infections.   Culture, blood (Routine X 2) w Reflex to ID Panel     Status: None (Preliminary result)   Collection Time: 05/15/16  5:47 AM  Result Value Ref Range Status   Specimen Description BLOOD RIGHT HAND  Final   Special Requests BOTTLES DRAWN AEROBIC AND ANAEROBIC  5CC  Final   Culture NO GROWTH 3 DAYS  Final   Report Status PENDING  Incomplete  Culture, Urine     Status: None   Collection Time: 05/15/16  5:50 AM  Result Value Ref Range Status   Specimen Description URINE, CATHETERIZED  Final   Special Requests NONE  Final   Culture NO GROWTH  Final   Report Status 05/16/2016 FINAL  Final  Culture, blood (Routine X 2) w Reflex to ID Panel     Status: None (Preliminary result)   Collection Time: 05/15/16  5:59 AM  Result Value Ref Range Status   Specimen Description BLOOD LEFT HAND  Final   Special Requests BOTTLES DRAWN AEROBIC AND ANAEROBIC  3CC  Final   Culture NO GROWTH 3 DAYS  Final   Report Status PENDING  Incomplete  Culture, blood (Routine X 2) w Reflex to ID Panel     Status: None (Preliminary result)   Collection Time: 05/17/16  6:25 PM    Result Value Ref Range Status   Specimen Description BLOOD LEFT FOREARM  Final   Special Requests BOTTLES DRAWN AEROBIC AND ANAEROBIC 5CC  Final   Culture NO GROWTH < 24 HOURS  Final  Report Status PENDING  Incomplete  Culture, blood (Routine X 2) w Reflex to ID Panel     Status: None (Preliminary result)   Collection Time: 05/17/16  8:00 PM  Result Value Ref Range Status   Specimen Description BLOOD BLOOD LEFT FOREARM  Final   Special Requests BOTTLES DRAWN AEROBIC AND ANAEROBIC 5CC  Final   Culture NO GROWTH < 24 HOURS  Final   Report Status PENDING  Incomplete         Radiology Studies: Dg Chest Port 1 View  Result Date: 05/17/2016 CLINICAL DATA:  Pneumonia. EXAM: PORTABLE CHEST 1 VIEW COMPARISON:  05/15/2016. FINDINGS: Interim extubation and removal of NG tube. Right IJ line stable position. AICD in stable position. Stable cardiomegaly. Significant clearing of bibasilar pulmonary infiltrates. COPD. Biapical pleural thickening noted consistent with scarring. No prominent pleural effusion. No pneumothorax. IMPRESSION: 1. Interim extubation removal of NG tube. 2. Significant clearing of bibasilar infiltrates. 3. AICD in stable position.  Stable heart size. Electronically Signed   By: Maisie Fus  Register   On: 05/17/2016 07:00        Scheduled Meds: . Melene Muller ON 05/19/2016] allopurinol  100 mg Oral Daily  . amiodarone  200 mg Oral Daily  . apixaban  2.5 mg Oral BID  . atorvastatin  20 mg Oral q1800  . famotidine  20 mg Oral Daily  . feeding supplement (ENSURE ENLIVE)  237 mL Oral BID BM  . gabapentin  300 mg Oral QHS  . insulin aspart  1-3 Units Subcutaneous Q4H  . insulin glargine  30 Units Subcutaneous Q24H  . mouth rinse  15 mL Mouth Rinse BID  . sodium chloride flush  10-40 mL Intracatheter Q12H  . vancomycin  750 mg Intravenous Q24H   Continuous Infusions:   LOS: 3 days    Time spent: 40 minutes    Elis Sauber, Roselind Messier, MD [facility] Pager [phone]   If  7PM-7AM, please contact night-coverage [email or web address] Password [email or web address] 05/18/2016, 5:07 PM

## 2016-05-18 NOTE — Progress Notes (Signed)
Advanced Heart Failure Rounding Note   Subjective:    Yesterday norepi stopped.   Denies SOB/CP. Mild dizziness when he stands.   Sed Rate 14  Creatinine 2.79>2.4>1.9   Objective:   Weight Range:  Vital Signs:   Temp:  [97.5 F (36.4 C)-98.6 F (37 C)] 98.6 F (37 C) (01/09 0400) Pulse Rate:  [68-88] 70 (01/09 0600) Resp:  [9-26] 11 (01/09 0600) BP: (75-118)/(41-56) 115/50 (01/09 0600) SpO2:  [88 %-100 %] 96 % (01/09 0600) Weight:  [177 lb 7.5 oz (80.5 kg)] 177 lb 7.5 oz (80.5 kg) (01/09 0400) Last BM Date: 05/17/16  Weight change: Filed Weights   05/16/16 0500 05/17/16 0437 05/18/16 0400  Weight: 183 lb 6.8 oz (83.2 kg) 177 lb 11.1 oz (80.6 kg) 177 lb 7.5 oz (80.5 kg)    Intake/Output:   Intake/Output Summary (Last 24 hours) at 05/18/16 0755 Last data filed at 05/18/16 0300  Gross per 24 hour  Intake          1444.02 ml  Output             1575 ml  Net          -130.98 ml     Physical Exam: CVP 1-2 General:  Lying in bed . No resp difficulty HEENT: normal Neck: supple. JVP flat. RIJ TLC . Carotids 2+ bilat; no bruits. No lymphadenopathy or thryomegaly appreciated. Cor: PMI laterally displaced. Regular rate & rhythm. No rubs, gallops or murmurs. Lungs:Clear Abdomen: soft, nontender, nondistended. No hepatosplenomegaly. No bruits or masses. Good bowel sounds. Extremities: no cyanosis, clubbing, rash, edema Neuro: alert & orientedx3, cranial nerves grossly intact. moves all 4 extremities w/o difficulty. Affect pleasant  Telemetry: AV paced 70s  Labs: Basic Metabolic Panel:  Recent Labs Lab 05/15/16 0547 05/16/16 0510 05/17/16 0451 05/18/16 0545  NA 134* 138 141 143  K 4.8 3.6 3.4* 3.4*  CL 99* 101 103 107  CO2 21* _0 GLUCOSE 371* 164* 93 100*  BUN 44* 50* 55* 46*  CREATININE 2.86* 2.79* 2.49* 1.90*  CALCIUM 8.5* 9.3 9.1 8.9  MG 2.0 2.2 2.2 2.1  PHOS  --  3.3 4.2 2.8    Liver Function Tests:  Recent Labs Lab 05/15/16 0547    AST 31  ALT 39  ALKPHOS 126  BILITOT 1.6*  PROT 5.6*  ALBUMIN 3.4*   No results for input(s): LIPASE, AMYLASE in the last 168 hours. No results for input(s): AMMONIA in the last 168 hours.  CBC:  Recent Labs Lab 05/15/16 0547 05/16/16 0510 05/17/16 0451 05/18/16 0545  WBC 10.9* 21.1* 10.7* 6.2  NEUTROABS 10.3*  --   --   --   HGB 14.3 14.7 13.9 12.7*  HCT 42.2 43.6 42.4 38.8*  MCV 97.9 97.1 100.0 100.0  PLT 128* 180 143* 115*    Cardiac Enzymes:  Recent Labs Lab 05/15/16 0547 05/15/16 1028 05/15/16 1824  TROPONINI 0.07* 0.06* 0.06*    BNP: BNP (last 3 results)  Recent Labs  02/04/16 1541 03/09/16 1105 04/20/16 1059  BNP 1,594.5* 713.4* 1,061.4*    ProBNP (last 3 results) No results for input(s): PROBNP in the last 8760 hours.    Other results:  Imaging: Dg Chest Port 1 View  Result Date: 05/17/2016 CLINICAL DATA:  Pneumonia. EXAM: PORTABLE CHEST 1 VIEW COMPARISON:  05/15/2016. FINDINGS: Interim extubation and removal of NG tube. Right IJ line stable position. AICD in stable position. Stable cardiomegaly. Significant clearing of bibasilar pulmonary infiltrates.  COPD. Biapical pleural thickening noted consistent with scarring. No prominent pleural effusion. No pneumothorax. IMPRESSION: 1. Interim extubation removal of NG tube. 2. Significant clearing of bibasilar infiltrates. 3. AICD in stable position.  Stable heart size. Electronically Signed   By: Speed   On: 05/17/2016 07:00     Medications:     Scheduled Medications: . amiodarone  200 mg Oral Daily  . apixaban  2.5 mg Oral BID  . famotidine  20 mg Oral Daily  . insulin aspart  1-3 Units Subcutaneous Q4H  . insulin glargine  30 Units Subcutaneous Q24H  . mouth rinse  15 mL Mouth Rinse BID  . sodium chloride flush  10-40 mL Intracatheter Q12H  . vancomycin  750 mg Intravenous Q24H    Infusions:   PRN Medications: sodium chloride, sodium chloride flush,  zolpidem   Assessment:   1. Septic shock 2. CAP 3. Acute respiratory failure 4. Acute on chronic systolic HF    --Due to NICM EF < 20%. S/p CRT-D (MDT) 5. Acute on chronic renal failure stage IV 6. PAF s/p DC-CV in 10/17   Plan/Discussion:    Suspect main issue is PNA/sepsis. Howard. No sputum culture avaialble. Continue abx per CCM.   Todays CO-OX 67%. Off norepi 2 mcg. SBP improved today. CVP remains 1. Give 1 liter NS. Holding diuretics. Unable to start bb. No ace/arb/arni with elevated creatinine.   Creatinine down to 1.9.    Continue eliquis 2.5 bid and amiodarone 200 daily.  ESR 15.  CXR improving.    OOB. Plan for HHPT.     Length of Stay: 3   Amy Clegg NP-C  05/18/2016, 7:55 AM  Advanced Heart Failure Team Pager [phone] (M-F; 7a - 4p)  Please contact [facility] for night-coverage after hours (4p -7a ) and weekends on [email or web address]  Patient seen and examined with Darrick Grinder, NP. We discussed all aspects of the encounter. I agree with the assessment and plan as stated above.   He is feeling better this am but very weak. CVP still 1-2. Co-ox ok. On low dose norepi. Will give 1L NS. Hold lasix. Renal function continues to improve. No bb or ARB yet.   Remains in NSR. Continue Eliquis.   Have asked PT to see to assess for possible CIR stay.  Can go to SDU.   Bensimhon, Daniel,MD 2:13 PM

## 2016-05-18 NOTE — Progress Notes (Signed)
Pt refuse NIV for the night. Pt states that he is anxious and claustrophobic.

## 2016-05-18 NOTE — Progress Notes (Signed)
Patient Name: Scott Parsons Date of Encounter: 05/18/2016  Primary Cardiologist: Drs. Allyson Sabal, Bensimhon and Allred  Hospital Problem List     Principal Problem:   Acute respiratory failure with hypoxia (HCC) Active Problems:   Peripheral arterial disease (HCC)   Nonischemic cardiomyopathy (HCC)   ICD (implantable cardioverter-defibrillator), dual, in situ   Paroxysmal a-fib (HCC)   Pressure injury of skin   Acute on chronic renal failure (HCC)   Cardiogenic shock (HCC)   Acute hypoxemic respiratory failure (HCC)     Subjective   Feels well.  Only complaint is feeling tired  Inpatient Medications    Scheduled Meds: . amiodarone  200 mg Oral Daily  . apixaban  2.5 mg Oral BID  . famotidine  20 mg Oral Daily  . insulin aspart  1-3 Units Subcutaneous Q4H  . insulin glargine  30 Units Subcutaneous Q24H  . mouth rinse  15 mL Mouth Rinse BID  . sodium chloride flush  10-40 mL Intracatheter Q12H  . vancomycin  750 mg Intravenous Q24H   Continuous Infusions:  PRN Meds: sodium chloride, sodium chloride flush, zolpidem   Vital Signs    Vitals:   05/18/16 0300 05/18/16 0400 05/18/16 0500 05/18/16 0600  BP: (!) 109/52 (!) 111/51 (!) 112/56 (!) 115/50  Pulse: 70 70 68 70  Resp: 15 (!) 9 10 11   Temp: 97.8 F (36.6 C) 98.6 F (37 C)    TempSrc: Oral Oral    SpO2: 93% 94% 94% 96%  Weight:  80.5 kg (177 lb 7.5 oz)    Height:        Intake/Output Summary (Last 24 hours) at 05/18/16 0802 Last data filed at 05/18/16 0300  Gross per 24 hour  Intake          1444.02 ml  Output             1575 ml  Net          -130.98 ml   Filed Weights   05/16/16 0500 05/17/16 0437 05/18/16 0400  Weight: 83.2 kg (183 lb 6.8 oz) 80.6 kg (177 lb 11.1 oz) 80.5 kg (177 lb 7.5 oz)    Physical Exam    GEN: Well nourished, well developed, in no acute distress.  HEENT: Grossly normal.  Neck: Supple, no JVD, carotid bruits, or masses. Cardiac: RRR, no murmurs, rubs, or gallops. No  clubbing, cyanosis, edema.  Radials/DP/PT 2+ and equal bilaterally.  Respiratory:  Respirations regular and unlabored, clear to auscultation bilaterally. GI: Soft, nontender, nondistended, BS + x 4. MS: no deformity or atrophy. Skin: warm and dry, no rash. Neuro:  Strength and sensation are intact. Psych: AAOx3.  Normal affect.  Labs    CBC  Recent Labs  05/17/16 0451 05/18/16 0545  WBC 10.7* 6.2  HGB 13.9 12.7*  HCT 42.4 38.8*  MCV 100.0 100.0  PLT 143* 115*   Basic Metabolic Panel  Recent Labs  05/17/16 0451 05/18/16 0545  NA 141 143  K 3.4* 3.4*  CL 103 107  CO2 30 29  GLUCOSE 93 100*  BUN 55* 46*  CREATININE 2.49* 1.90*  CALCIUM 9.1 8.9  MG 2.2 2.1  PHOS 4.2 2.8   Liver Function Tests No results for input(s): AST, ALT, ALKPHOS, BILITOT, PROT, ALBUMIN in the last 72 hours. No results for input(s): LIPASE, AMYLASE in the last 72 hours. Cardiac Enzymes  Recent Labs  05/15/16 1028 05/15/16 1824  TROPONINI 0.06* 0.06*   BNP Invalid input(s): POCBNP D-Dimer  No results for input(s): DDIMER in the last 72 hours. Hemoglobin A1C No results for input(s): HGBA1C in the last 72 hours. Fasting Lipid Panel No results for input(s): CHOL, HDL, LDLCALC, TRIG, CHOLHDL, LDLDIRECT in the last 72 hours. Thyroid Function Tests No results for input(s): TSH, T4TOTAL, T3FREE, THYROIDAB in the last 72 hours.  Invalid input(s): FREET3  Telemetry    APVP.  3 beats of NSVT.  PVCs.  - Personally Reviewed  ECG    n/a - Personally Reviewed  Radiology    Dg Chest Port 1 View  Result Date: 05/17/2016 CLINICAL DATA:  Pneumonia. EXAM: PORTABLE CHEST 1 VIEW COMPARISON:  05/15/2016. FINDINGS: Interim extubation and removal of NG tube. Right IJ line stable position. AICD in stable position. Stable cardiomegaly. Significant clearing of bibasilar pulmonary infiltrates. COPD. Biapical pleural thickening noted consistent with scarring. No prominent pleural effusion. No pneumothorax.  IMPRESSION: 1. Interim extubation removal of NG tube. 2. Significant clearing of bibasilar infiltrates. 3. AICD in stable position.  Stable heart size. Electronically Signed   By: Maisie Fus  Register   On: 05/17/2016 07:00    Cardiac Studies   Echo 11/2015: Study Conclusions  - Left ventricle: ? some ventricular non compaction. The cavity   size was severely dilated. Wall thickness was normal. Systolic   function was severely reduced. The estimated ejection fraction   was in the range of 20% to 25%. Diffuse hypokinesis. Doppler   parameters are consistent with both elevated ventricular   end-diastolic filling pressure and elevated left atrial filling   pressure. - Aortic valve: There was mild stenosis. Valve area (VTI): 1.04   cm^2. Valve area (Vmax): 0.85 cm^2. Valve area (Vmean): 0.82   cm^2. - Mitral valve: There was moderate regurgitation. - Left atrium: The atrium was severely dilated. - Atrial septum: No defect or patent foramen ovale was identified. - Impressions: AS severity hard to judge due to poor LV function   but not severe.  Impressions:  - AS severity hard to judge due to poor LV function but not severe.  TEE 02/2016: LVEF 15-20%.  Severe central MR.  Mildly reduced RV function.    Patient Profile     Mr. Ritacco is an 77M with chronic systolic and diastolic heart failure (LVEF 15-20%), severe mitral regurgitation, LBBB, s/p BiV ICD, persistent atrial fibrillation s/p DCCV 02/2016  Assessment & Plan    # CAP: # Septic shock:  Mr. Fuhrmann is no longer requiring pressors.  However, his CVP is 0, indicating intravascular volume depletion.  We will give a gentle fluid bolus.   # Chronic systolic and diastolic heart failure:  LVEF 16-10%.  Co-ox this am is 67 down from 70 yesterday.  Septic shock slowly resolving.  We will give a gentle fluid bolus of 500 mL.  Holding home lasix, Imdur, metoprolol and spironolactone.   # Persistent atrial fibrillation: Mr. Vuolo  is not currently in atrial fibrillation as telemetry shows APVP.  Continue Eliquis and amiodarone. He underwent DCCV 02/2016.   # Acute on chronic renal failure: Creatinine improved to 1.9 today.   # Hyperlipidemia: Continue atorvastatin.  Signed, Chilton Si, MD  05/18/2016, 8:02 AM

## 2016-05-18 NOTE — Progress Notes (Signed)
PT Potassium 3.4. Bahgat, PA paged. See new order for Potassium replacement.   Glade Lloyd, RN

## 2016-05-18 NOTE — Discharge Instructions (Signed)
Nutrition Post Hospital Stay Proper nutrition can help your body recover from illness and injury.   Foods and beverages high in protein, vitamins, and minerals help rebuild muscle loss, promote healing, & reduce fall risk.   In addition to eating healthy foods, a nutrition shake is an easy, delicious way to get the nutrition you need during and after your hospital stay  It is recommended that you continue to drink 2 bottles per day of:       Ensure for at least 1 month (30 days) after your hospital stay  As needed based on appetite  Tips for adding a nutrition shake into your routine: As allowed, drink one with vitamins or medications instead of water or juice Enjoy one as a tasty mid-morning or afternoon snack Drink cold or make a milkshake out of it Drink one instead of milk with cereal or snacks Use as a coffee creamer   Available at the following grocery stores and pharmacies:           * Karin Golden * [employer] * [employer]  * R[employer] Aid          * Walmart * [employer]  * Walgreens      * Target  * BJ's   * CVS  * [employer]   * [facility] [phone]            For COUPONS visit: www.[email or web address]/join or [email or web address].br   Suggested Substitutions Ensure Plus = Boost Plus = Carnation Breakfast Essentials = Boost Compact Ensure Active Clear = Boost Breeze Glucerna Shake = Boost Glucose Control = Carnation Breakfast Essentials SUGAR FREE

## 2016-05-18 NOTE — Progress Notes (Signed)
  2D Echocardiogram has been performed.  Scott Parsons L Androw 05/18/2016, 4:01 PM

## 2016-05-18 NOTE — Progress Notes (Signed)
Physical Therapy Treatment Patient Details Name: Scott Parsons MRN: [id number] DOB: 1929/05/15 Today's Date: 05/18/2016    History of Present Illness 83yo man with PMHx of chronic systolic CHF (EF 40-98% per last echo here), nonischemic cardiomyopathy, LBBB s/p biV ICD, AFib on Eliquis, COPD, and CKD stage IV who presented to Hospital with shortness of breath in setting of possible PNA/sepsis.    PT Comments    Pt with improved ambulation tolerance but con't to require min A for bed mobility and min guard for ambulation. Acute PT to con't to follow.  Follow Up Recommendations  Home health PT;Supervision/Assistance - 24 hour     Equipment Recommendations  None recommended by PT    Recommendations for Other Services       Precautions / Restrictions Precautions Precautions: Fall Restrictions Weight Bearing Restrictions: No    Mobility  Bed Mobility Overal bed mobility: Needs Assistance Bed Mobility: Supine to Sit     Supine to sit: Min assist     General bed mobility comments: minA for trunk elevation due to being in air mattress and air being deflated  Transfers Overall transfer level: Needs assistance Equipment used: 1 person hand held assist Transfers: Sit to/from Stand Sit to Stand: Min assist         General transfer comment: guarded and slow but pushed up properly from the bed  Ambulation/Gait Ambulation/Gait assistance: Min guard Ambulation Distance (Feet): 250 Feet Assistive device:  (pushed IV pole) Gait Pattern/deviations: Step-through pattern;Decreased stride length;Drifts right/left;Narrow base of support Gait velocity: dec Gait velocity interpretation: Below normal speed for age/gender General Gait Details: pt with improved fluidity of gait pattern, SpO2 >93% on RA. pt reports 2/4 on dyspnea scale   Stairs            Wheelchair Mobility    Modified Rankin (Stroke Patients Only)       Balance Overall balance assessment: Needs  assistance         Standing balance support: Single extremity supported Standing balance-Leahy Scale: Fair Standing balance comment: pt prefers to hold on IV pole to steady himself                    Cognition Arousal/Alertness: Awake/alert Behavior During Therapy: WFL for tasks assessed/performed Overall Cognitive Status: Within Functional Limits for tasks assessed                      Exercises      General Comments        Pertinent Vitals/Pain Pain Assessment: No/denies pain    Home Living                      Prior Function            PT Goals (current goals can now be found in the care plan section) Progress towards PT goals: Progressing toward goals    Frequency    Min 3X/week      PT Plan Current plan remains appropriate    Co-evaluation             End of Session Equipment Utilized During Treatment: Gait belt Activity Tolerance: Patient tolerated treatment well Patient left: with call bell/phone within reach;in bed;with bed alarm set ([place] tech present for echo)     Time: 1451-1510 PT Time Calculation (min) (ACUTE ONLY): 19 min  Charges:  $Gait Training: 8-22 mins  G CodesRozell Searing Tyechia Allmendinger 2016/05/23, 3:52 PM   Lewis Shock, PT, DPT Pager #: [phone] Office #: [phone]

## 2016-05-19 DIAGNOSIS — A419 Sepsis, unspecified organism: Principal | ICD-10-CM

## 2016-05-19 LAB — CBC
HCT: 39 % (ref 39.0–52.0)
HEMOGLOBIN: 13 g/dL (ref 13.0–17.0)
MCH: 32.8 pg (ref 26.0–34.0)
MCHC: 33.3 g/dL (ref 30.0–36.0)
MCV: 98.5 fL (ref 78.0–100.0)
Platelets: 128 10*3/uL — ABNORMAL LOW (ref 150–400)
RBC: 3.96 MIL/uL — ABNORMAL LOW (ref 4.22–5.81)
RDW: 13.8 % (ref 11.5–15.5)
WBC: 6.3 10*3/uL (ref 4.0–10.5)

## 2016-05-19 LAB — COOXEMETRY PANEL
CARBOXYHEMOGLOBIN: 1 % (ref 0.5–1.5)
METHEMOGLOBIN: 0.8 % (ref 0.0–1.5)
O2 Saturation: 67.8 %
Total hemoglobin: 14.2 g/dL (ref 12.0–16.0)

## 2016-05-19 LAB — BASIC METABOLIC PANEL
Anion gap: 4 — ABNORMAL LOW (ref 5–15)
BUN: 34 mg/dL — AB (ref 6–20)
CHLORIDE: 110 mmol/L (ref 101–111)
CO2: 26 mmol/L (ref 22–32)
CREATININE: 1.47 mg/dL — AB (ref 0.61–1.24)
Calcium: 8.7 mg/dL — ABNORMAL LOW (ref 8.9–10.3)
GFR calc Af Amer: 49 mL/min — ABNORMAL LOW (ref >60)
GFR calc non Af Amer: 42 mL/min — ABNORMAL LOW (ref >60)
Glucose, Bld: 76 mg/dL (ref 65–99)
Potassium: 3.6 mmol/L (ref 3.5–5.1)
SODIUM: 140 mmol/L (ref 135–145)

## 2016-05-19 LAB — PHOSPHORUS: Phosphorus: 2.6 mg/dL (ref 2.5–4.6)

## 2016-05-19 LAB — GLUCOSE, CAPILLARY
GLUCOSE-CAPILLARY: 140 mg/dL — AB (ref 65–99)
GLUCOSE-CAPILLARY: 72 mg/dL (ref 65–99)
GLUCOSE-CAPILLARY: 75 mg/dL (ref 65–99)
GLUCOSE-CAPILLARY: 85 mg/dL (ref 65–99)
Glucose-Capillary: 242 mg/dL — ABNORMAL HIGH (ref 65–99)
Glucose-Capillary: 91 mg/dL (ref 65–99)
Glucose-Capillary: 195 mg/dL — ABNORMAL HIGH (ref 65–99)

## 2016-05-19 LAB — T4, FREE: FREE T4: 1.4 ng/dL — AB (ref 0.61–1.12)

## 2016-05-19 LAB — PROCALCITONIN: PROCALCITONIN: 0.13 ng/mL

## 2016-05-19 LAB — MAGNESIUM: MAGNESIUM: 2.1 mg/dL (ref 1.7–2.4)

## 2016-05-19 MED ORDER — INSULIN ASPART 100 UNIT/ML ~~LOC~~ SOLN
0.0000 [IU] | Freq: Three times a day (TID) | SUBCUTANEOUS | Status: DC
  Administered 2016-05-19: 5 [IU] via SUBCUTANEOUS
  Administered 2016-05-21: 3 [IU] via SUBCUTANEOUS

## 2016-05-19 MED ORDER — INSULIN ASPART 100 UNIT/ML ~~LOC~~ SOLN: 0.0000 [IU] | Freq: Every day | SUBCUTANEOUS | Status: DC

## 2016-05-19 MED ORDER — SODIUM CHLORIDE 0.9 % IV SOLN
INTRAVENOUS | Status: DC
  Administered 2016-05-19: 20:00:00 via INTRAVENOUS

## 2016-05-19 NOTE — Progress Notes (Signed)
Advanced Heart Failure Rounding Note   Subjective:    Yesterday received 1 liter NS. Dizziness resolved.   Denies SOB/CP.   Sed Rate 14  Creatinine 2.79>2.4>1.9 >1.47   Objective:   Weight Range:  Vital Signs:   Temp:  [97.6 F (36.4 C)-98 F (36.7 C)] 98 F (36.7 C) (01/10 0400) Pulse Rate:  [67-77] 70 (01/10 0700) Resp:  [9-32] 9 (01/10 0700) BP: (90-127)/(43-90) 105/58 (01/10 0700) SpO2:  [92 %-100 %] 100 % (01/10 0700) Weight:  [181 lb 7 oz (82.3 kg)] 181 lb 7 oz (82.3 kg) (01/10 0500) Last BM Date: 05/17/16  Weight change: Filed Weights   05/17/16 0437 05/18/16 0400 05/19/16 0500  Weight: 177 lb 11.1 oz (80.6 kg) 177 lb 7.5 oz (80.5 kg) 181 lb 7 oz (82.3 kg)    Intake/Output:   Intake/Output Summary (Last 24 hours) at 05/19/16 0727 Last data filed at 05/19/16 0000  Gross per 24 hour  Intake          1005.48 ml  Output              535 ml  Net           470.48 ml     Physical Exam: CVP ~3 General:  Lying in bed . No resp difficulty HEENT: normal Neck: supple. JVP flat. RIJ TLC . Carotids 2+ bilat; no bruits. No lymphadenopathy or thryomegaly appreciated. Cor: PMI laterally displaced. Regular rate & rhythm. No rubs, gallops or murmurs. Lungs:Clear except decreased in RLL Abdomen: soft, nontender, nondistended. No hepatosplenomegaly. No bruits or masses. Good bowel sounds. Extremities: no cyanosis, clubbing, rash, edema Neuro: alert & orientedx3, cranial nerves grossly intact. moves all 4 extremities w/o difficulty. Affect pleasant  Telemetry: AV paced 70s  Labs: Basic Metabolic Panel:  Recent Labs Lab 05/15/16 0547 05/16/16 0510 05/17/16 0451 05/18/16 0545 05/19/16 0420  NA 134* 138 141 143 140  K 4.8 3.6 3.4* 3.4* 3.6  CL 99* 101 103 107 110  CO2 21* '25 30 29 26  '$ GLUCOSE 371* 164* 93 100* 76  BUN 44* 50* 55* 46* 34*  CREATININE 2.86* 2.79* 2.49* 1.90* 1.47*  CALCIUM 8.5* 9.3 9.1 8.9 8.7*  MG 2.0 2.2 2.2 2.1 2.1  PHOS  --  3.3 4.2  2.8 2.6    Liver Function Tests:  Recent Labs Lab 05/15/16 0547  AST 31  ALT 39  ALKPHOS 126  BILITOT 1.6*  PROT 5.6*  ALBUMIN 3.4*   No results for input(s): LIPASE, AMYLASE in the last 168 hours. No results for input(s): AMMONIA in the last 168 hours.  CBC:  Recent Labs Lab 05/15/16 0547 05/16/16 0510 05/17/16 0451 05/18/16 0545 05/19/16 0420  WBC 10.9* 21.1* 10.7* 6.2 6.3  NEUTROABS 10.3*  --   --   --   --   HGB 14.3 14.7 13.9 12.7* 13.0  HCT 42.2 43.6 42.4 38.8* 39.0  MCV 97.9 97.1 100.0 100.0 98.5  PLT 128* 180 143* 115* 128*    Cardiac Enzymes:  Recent Labs Lab 05/15/16 0547 05/15/16 1028 05/15/16 1824  TROPONINI 0.07* 0.06* 0.06*    BNP: BNP (last 3 results)  Recent Labs  02/04/16 1541 03/09/16 1105 04/20/16 1059  BNP 1,594.5* 713.4* 1,061.4*    ProBNP (last 3 results) No results for input(s): PROBNP in the last 8760 hours.    Other results:  Imaging: No results found.   Medications:     Scheduled Medications: . allopurinol  100 mg Oral Daily  .  amiodarone  200 mg Oral Daily  . apixaban  2.5 mg Oral BID  . atorvastatin  20 mg Oral q1800  . famotidine  20 mg Oral Daily  . feeding supplement (ENSURE ENLIVE)  237 mL Oral BID BM  . gabapentin  300 mg Oral QHS  . insulin aspart  1-3 Units Subcutaneous Q4H  . insulin glargine  30 Units Subcutaneous Q24H  . mouth rinse  15 mL Mouth Rinse BID  . sodium chloride flush  10-40 mL Intracatheter Q12H  . vancomycin  1,000 mg Intravenous Q24H    Infusions:   PRN Medications: sodium chloride flush, zolpidem   Assessment:   1. Septic shock    --BCX from [facility] 2/2 staph epi 2. CAP 3. Acute respiratory failure 4. Acute on chronic systolic HF    --Due to NICM EF < 20%. S/p CRT-D (MDT) 5. Acute on chronic renal failure stage IV 6. PAF s/p DC-CV in 10/17   Plan/Discussion:    Suspect main issue is PNA/sepsis. [place]. No sputum culture avaialble. Continue abx per CCM.    Todays CO-OX 68%. SBP improved today. CVP 3. Holding diuretics. Unable to start bb wit soft BP. No ace/arb/arni with elevated creatinine.   Creatinine down to 1.47    Continue eliquis 2.5 bid and amiodarone 200 daily.  ESR 15.  CXR improving.    OOB. Plan for HHPT.    Should be able to go home soon. ? Thursday.    Length of Stay: 4   Amy Clegg NP-C  05/19/2016, 7:27 AM  Advanced Heart Failure Team Pager [phone] (M-F; 7a - 4p)  Please contact [facility] for night-coverage after hours (4p -7a ) and weekends on [email or web address]  Patient seen and examined with Darrick Grinder, NP. We discussed all aspects of the encounter. I agree with the assessment and plan as stated above.   Continues to improve. Volume status better. Off pressors. BP too low to add back HF meds.   Bcx from [place] 2/2 staph epi. May be contaminant but can also be real. I spoke with Dr. Renne Crigler who spoke with ID. Will plan 7 days of IV vancomycin as long as no device infection. Will plan TEE tomorrow. Home Friday possibly after vancomycin completed.   Continue Eliquis and amio for AF. Renal function back to baseline.   Bensimhon, Daniel,MD 3:43 PM

## 2016-05-19 NOTE — Progress Notes (Signed)
PROGRESS NOTE  Scott Parsons AVW:[id number] DOB: 08/01/1929 DOA: 05/15/2016 PCP: [name], MD   LOS: 4 days   Brief Narrative: 83yo WM PMHx CVA, Chronic Systolic CHF (EF 82-95% per last echo here), Nonischemic Cardiomyopathy, LBBB S/P biV ICD, A-Fib on Eliquis, CAD native artery, COPD, OSA, and CKD stage IV, Renal artery stenosis. Who presented to [facility] with shortness of breath. He developed SOB and had taken an extra dose of Lasix 60 mgat home, but remained symptomatic so went to the hospital. At [facility] he was placed on BiPAP but oxygen saturation remained in the 70s and thus he was intubated. He received Lasix 80 mg IV, Solu-medrol with concern for possible COPD playing a role in his SOB, and broad spectrum antibiotics. His initial ABG revealed pH 7.28/pCO2 53/pO2 60 and after intubation improved to pH 7.34/pCO2 45/pO2 87. He was hypotensive so Levophed and Dobutamine had been initiated. He was then transferred to [facility] for further care.   Assessment & Plan: Principal Problem:   Acute respiratory failure with hypoxia (HCC) Active Problems:   Peripheral arterial disease (HCC)   Nonischemic cardiomyopathy (HCC)   ICD (implantable cardioverter-defibrillator), dual, in situ   Paroxysmal a-fib (HCC)   Pressure injury of skin   Acute on chronic renal failure (HCC)   Cardiogenic shock (HCC)   Acute hypoxemic respiratory failure (HCC)   COPD mixed type (HCC)   Persistent atrial fibrillation (HCC)   LBBB (left bundle branch block)   Acute renal failure with acute tubular necrosis superimposed on stage 4 chronic kidney disease (HCC)   Uncontrolled type 2 diabetes mellitus with complication (HCC)   Acute hypoxic respiratory failure likely due to CHF exacerbation vs CAP - Resolved, he was initially on antibiotics of vancomycin and cefepime, felt less likely to have community-acquired pneumonia given clearing of infiltrates after diuresis, cefepime discontinued on  1/8, continue vancomycin for staph epidermidis bacteremia  COPD  - titrate O2 to maintain SPO2 89 to 93%  OSA - CPAP per respiratory  Acute on Chronic Systolic and Diastolic CHF Exacerbation/ Nonischemic cardiomyopathy with hypotension  - Hypotension requiring dobutamine and levophed initially>>Resolved, patient received a liter normal saline on 1/9 due to intravascular depletion  Persistent AFib - Currently in NSR - Amiodarone 200 mg daily - Eliquis  HTN - Borderline hypotensive initially, blood pressure is now improved  LBBB  - s/p biV ICD  PVD  HLD  Acute renal failure on CKD Stage IV (baseline Cr 1.8 to 2.0)   - Cr 2.3 at [facility], improving, now 1.47  Hx renal artery stenosis s/p L renal artery stent in 2007  Staph epidermidis bacteremia 2/2 bottles - Complete course vancomycin 7 day course, 2d echo without significant vegetations, d/w/ Dr. Orvan Falconer   Hypothyroidism - Elevated TSH (10 at [facility]), likely due to acute illness - free T4 1.4 - resume synthroid  DM type II controlled with complication - 1/6 Hemoglobin A1c= 6.9 - Lantus 30 units daily - SSI   Hypokalemia - Potassium goal> 4 - K-Dur 40 mEq   DVT prophylaxis: Eliquis Code Status: Full code Family Communication: no family at bedside Disposition Plan: home Friday  Consultants:   Cardiology   PCCM  Procedures:   2D echo 1/19 Echogardiogram; Left ventricle: severely dilated. LVEF =15-20%. Diffuse hypokinesis.  -(grade 2 diastolic dysfunction).  - Mitral valve: There was mild to moderate regurgitation.  Antimicrobials:  Vancomycin 1/6 >> day 5/7 end 11/12  Subjective: - no chest pain, shortness of breath, no  abdominal pain, nausea or vomiting.   Objective: Vitals:   05/19/16 0800 05/19/16 0900 05/19/16 1030 05/19/16 1100  BP: (!) 113/50 (!) 117/59 (!) 115/52 (!) 118/52  Pulse: 70 80    Resp: 12 19 13  (!) 28  Temp:    97.7 F (36.5 C)  TempSrc:    Oral  SpO2: 95%  (!) 87%    Weight:      Height:        Intake/Output Summary (Last 24 hours) at 05/19/16 1131 Last data filed at 05/19/16 0800  Gross per 24 hour  Intake              480 ml  Output              535 ml  Net              -55 ml   Filed Weights   05/17/16 0437 05/18/16 0400 05/19/16 0500  Weight: 80.6 kg (177 lb 11.1 oz) 80.5 kg (177 lb 7.5 oz) 82.3 kg (181 lb 7 oz)    Examination: Constitutional: NAD Vitals:   05/19/16 0800 05/19/16 0900 05/19/16 1030 05/19/16 1100  BP: (!) 113/50 (!) 117/59 (!) 115/52 (!) 118/52  Pulse: 70 80    Resp: 12 19 13  (!) 28  Temp:    97.7 F (36.5 C)  TempSrc:    Oral  SpO2: 95% (!) 87%    Weight:      Height:       Eyes: PERRL, lids and conjunctivae normal Respiratory: clear to auscultation bilaterally, no wheezing, no crackles.  Cardiovascular: Regular rate and rhythm, no murmurs / rubs / gallops.  Abdomen: no tenderness. Bowel sounds positive.  Musculoskeletal: no clubbing / cyanosis Neurologic: non focal    Data Reviewed: I have personally reviewed following labs and imaging studies  CBC:  Recent Labs Lab 05/15/16 0547 05/16/16 0510 05/17/16 0451 05/18/16 0545 05/19/16 0420  WBC 10.9* 21.1* 10.7* 6.2 6.3  NEUTROABS 10.3*  --   --   --   --   HGB 14.3 14.7 13.9 12.7* 13.0  HCT 42.2 43.6 42.4 38.8* 39.0  MCV 97.9 97.1 100.0 100.0 98.5  PLT 128* 180 143* 115* 128*   Basic Metabolic Panel:  Recent Labs Lab 05/15/16 0547 05/16/16 0510 05/17/16 0451 05/18/16 0545 05/19/16 0420  NA 134* 138 141 143 140  K 4.8 3.6 3.4* 3.4* 3.6  CL 99* 101 103 107 110  CO2 21* 25 30 29 26   GLUCOSE 371* 164* 93 100* 76  BUN 44* 50* 55* 46* 34*  CREATININE 2.86* 2.79* 2.49* 1.90* 1.47*  CALCIUM 8.5* 9.3 9.1 8.9 8.7*  MG 2.0 2.2 2.2 2.1 2.1  PHOS  --  3.3 4.2 2.8 2.6   GFR: Estimated Creatinine Clearance: 43.5 mL/min (by C-G formula based on SCr of 1.47 mg/dL (H)). Liver Function Tests:  Recent Labs Lab 05/15/16 0547  AST 31  ALT  39  ALKPHOS 126  BILITOT 1.6*  PROT 5.6*  ALBUMIN 3.4*   No results for input(s): LIPASE, AMYLASE in the last 168 hours. No results for input(s): AMMONIA in the last 168 hours. Coagulation Profile:  Recent Labs Lab 05/15/16 0547  INR 1.07   Cardiac Enzymes:  Recent Labs Lab 05/15/16 0547 05/15/16 1028 05/15/16 1824  TROPONINI 0.07* 0.06* 0.06*   BNP (last 3 results) No results for input(s): PROBNP in the last 8760 hours. HbA1C: No results for input(s): HGBA1C in the last 72 hours. CBG:  Recent Labs Lab 05/19/16 0022 05/19/16 0416 05/19/16 0419 05/19/16 0828 05/19/16 1103  GLUCAP 140* 72 75 85 242*   Lipid Profile: No results for input(s): CHOL, HDL, LDLCALC, TRIG, CHOLHDL, LDLDIRECT in the last 72 hours. Thyroid Function Tests:  Recent Labs  05/19/16 0420  FREET4 1.40*   Anemia Panel: No results for input(s): VITAMINB12, FOLATE, FERRITIN, TIBC, IRON, RETICCTPCT in the last 72 hours. Urine analysis:    Component Value Date/Time   COLORURINE YELLOW 05/15/2016 0550   APPEARANCEUR CLEAR 05/15/2016 0550   LABSPEC 1.008 05/15/2016 0550   PHURINE 5.0 05/15/2016 0550   GLUCOSEU >=500 (A) 05/15/2016 0550   HGBUR LARGE (A) 05/15/2016 0550   BILIRUBINUR NEGATIVE 05/15/2016 0550   KETONESUR NEGATIVE 05/15/2016 0550   PROTEINUR NEGATIVE 05/15/2016 0550   NITRITE NEGATIVE 05/15/2016 0550   LEUKOCYTESUR NEGATIVE 05/15/2016 0550   Sepsis Labs: Invalid input(s): PROCALCITONIN, LACTICIDVEN  Recent Results (from the past 240 hour(s))  MRSA PCR Screening     Status: None   Collection Time: 05/15/16  5:39 AM  Result Value Ref Range Status   MRSA by PCR NEGATIVE NEGATIVE Final    Comment:        The GeneXpert MRSA Assay (FDA approved for NASAL specimens only), is one component of a comprehensive MRSA colonization surveillance program. It is not intended to diagnose MRSA infection nor to guide or monitor treatment for MRSA infections.   Culture, blood  (Routine X 2) w Reflex to ID Panel     Status: None (Preliminary result)   Collection Time: 05/15/16  5:47 AM  Result Value Ref Range Status   Specimen Description BLOOD RIGHT HAND  Final   Special Requests BOTTLES DRAWN AEROBIC AND ANAEROBIC  5CC  Final   Culture NO GROWTH 3 DAYS  Final   Report Status PENDING  Incomplete  Culture, Urine     Status: None   Collection Time: 05/15/16  5:50 AM  Result Value Ref Range Status   Specimen Description URINE, CATHETERIZED  Final   Special Requests NONE  Final   Culture NO GROWTH  Final   Report Status 05/16/2016 FINAL  Final  Culture, blood (Routine X 2) w Reflex to ID Panel     Status: None (Preliminary result)   Collection Time: 05/15/16  5:59 AM  Result Value Ref Range Status   Specimen Description BLOOD LEFT HAND  Final   Special Requests BOTTLES DRAWN AEROBIC AND ANAEROBIC  3CC  Final   Culture NO GROWTH 3 DAYS  Final   Report Status PENDING  Incomplete  Culture, blood (Routine X 2) w Reflex to ID Panel     Status: None (Preliminary result)   Collection Time: 05/17/16  6:25 PM  Result Value Ref Range Status   Specimen Description BLOOD LEFT FOREARM  Final   Special Requests BOTTLES DRAWN AEROBIC AND ANAEROBIC 5CC  Final   Culture NO GROWTH < 24 HOURS  Final   Report Status PENDING  Incomplete  Culture, blood (Routine X 2) w Reflex to ID Panel     Status: None (Preliminary result)   Collection Time: 05/17/16  8:00 PM  Result Value Ref Range Status   Specimen Description BLOOD BLOOD LEFT FOREARM  Final   Special Requests BOTTLES DRAWN AEROBIC AND ANAEROBIC 5CC  Final   Culture NO GROWTH < 24 HOURS  Final   Report Status PENDING  Incomplete      Radiology Studies: No results found.   Scheduled Meds: . allopurinol  100  mg Oral Daily  . amiodarone  200 mg Oral Daily  . apixaban  2.5 mg Oral BID  . atorvastatin  20 mg Oral q1800  . famotidine  20 mg Oral Daily  . feeding supplement (ENSURE ENLIVE)  237 mL Oral BID BM  .  gabapentin  300 mg Oral QHS  . insulin aspart  1-3 Units Subcutaneous Q4H  . insulin glargine  30 Units Subcutaneous Q24H  . mouth rinse  15 mL Mouth Rinse BID  . sodium chloride flush  10-40 mL Intracatheter Q12H  . vancomycin  1,000 mg Intravenous Q24H   Continuous Infusions:  Pamella Pert, MD, PhD [facility] Pager [phone]  If 7PM-7AM, please contact night-coverage [email or web address] Password [email or web address] 05/19/2016, 11:31 AM

## 2016-05-19 NOTE — Progress Notes (Signed)
Patient refused CPAP for the night. Patient wear oxygen set at 2lpm with SP02=97%. Will continue to monitor.

## 2016-05-20 ENCOUNTER — Encounter (HOSPITAL_COMMUNITY)
Admission: AD | Disposition: A | Discharge status: Home / Self Care | Payer: Self-pay | Source: Other Acute Inpatient Hospital | Attending: Internal Medicine

## 2016-05-20 ENCOUNTER — Inpatient Hospital Stay (HOSPITAL_COMMUNITY): Payer: Medicare Other

## 2016-05-20 DIAGNOSIS — R7881 Bacteremia: Secondary | ICD-10-CM

## 2016-05-20 HISTORY — PX: TEE WITHOUT CARDIOVERSION: SHX5443

## 2016-05-20 LAB — CULTURE, BLOOD (ROUTINE X 2)
CULTURE: NO GROWTH
Culture: NO GROWTH

## 2016-05-20 LAB — COMPREHENSIVE METABOLIC PANEL
ALT: 18 U/L (ref 17–63)
AST: 19 U/L (ref 15–41)
Albumin: 2.9 g/dL — ABNORMAL LOW (ref 3.5–5.0)
Alkaline Phosphatase: 93 U/L (ref 38–126)
Anion gap: 8 (ref 5–15)
BUN: 32 mg/dL — ABNORMAL HIGH (ref 6–20)
CHLORIDE: 108 mmol/L (ref 101–111)
CO2: 24 mmol/L (ref 22–32)
CREATININE: 1.51 mg/dL — AB (ref 0.61–1.24)
Calcium: 8.5 mg/dL — ABNORMAL LOW (ref 8.9–10.3)
GFR, EST AFRICAN AMERICAN: 47 mL/min — AB (ref >60)
GFR, EST NON AFRICAN AMERICAN: 41 mL/min — AB (ref >60)
Glucose, Bld: 89 mg/dL (ref 65–99)
Potassium: 3.9 mmol/L (ref 3.5–5.1)
Sodium: 140 mmol/L (ref 135–145)
Total Bilirubin: 1.3 mg/dL — ABNORMAL HIGH (ref 0.3–1.2)
Total Protein: 5 g/dL — ABNORMAL LOW (ref 6.5–8.1)

## 2016-05-20 LAB — CBC
HEMATOCRIT: 38.7 % — AB (ref 39.0–52.0)
Hemoglobin: 12.8 g/dL — ABNORMAL LOW (ref 13.0–17.0)
MCH: 32.9 pg (ref 26.0–34.0)
MCHC: 33.1 g/dL (ref 30.0–36.0)
MCV: 99.5 fL (ref 78.0–100.0)
PLATELETS: 122 10*3/uL — AB (ref 150–400)
RBC: 3.89 MIL/uL — ABNORMAL LOW (ref 4.22–5.81)
RDW: 14.1 % (ref 11.5–15.5)
WBC: 6.1 10*3/uL (ref 4.0–10.5)

## 2016-05-20 LAB — GLUCOSE, CAPILLARY
GLUCOSE-CAPILLARY: 97 mg/dL (ref 65–99)
GLUCOSE-CAPILLARY: 109 mg/dL — AB (ref 65–99)
GLUCOSE-CAPILLARY: 103 mg/dL — AB (ref 65–99)
GLUCOSE-CAPILLARY: 188 mg/dL — AB (ref 65–99)

## 2016-05-20 LAB — PHOSPHORUS: PHOSPHORUS: 2.8 mg/dL (ref 2.5–4.6)

## 2016-05-20 LAB — COOXEMETRY PANEL
Carboxyhemoglobin: 1.1 % (ref 0.5–1.5)
METHEMOGLOBIN: 1.2 % (ref 0.0–1.5)
O2 Saturation: 62.2 %
TOTAL HEMOGLOBIN: 12.4 g/dL (ref 12.0–16.0)

## 2016-05-20 LAB — MAGNESIUM: MAGNESIUM: 2.1 mg/dL (ref 1.7–2.4)

## 2016-05-20 SURGERY — ECHOCARDIOGRAM, TRANSESOPHAGEAL
Anesthesia: Moderate Sedation

## 2016-05-20 MED ORDER — LIDOCAINE VISCOUS 2 % MT SOLN
OROMUCOSAL | Status: AC
  Filled 2016-05-20: qty 15

## 2016-05-20 MED ORDER — MIDAZOLAM HCL 5 MG/ML IJ SOLN
INTRAMUSCULAR | Status: AC
  Filled 2016-05-20: qty 2

## 2016-05-20 MED ORDER — FENTANYL CITRATE (PF) 100 MCG/2ML IJ SOLN
INTRAMUSCULAR | Status: DC | PRN
  Administered 2016-05-20 (×2): 25 ug via INTRAVENOUS

## 2016-05-20 MED ORDER — MIDAZOLAM HCL 10 MG/2ML IJ SOLN
INTRAMUSCULAR | Status: DC | PRN
  Administered 2016-05-20 (×2): 1 mg via INTRAVENOUS

## 2016-05-20 MED ORDER — FENTANYL CITRATE (PF) 100 MCG/2ML IJ SOLN
INTRAMUSCULAR | Status: AC
  Filled 2016-05-20: qty 2

## 2016-05-20 NOTE — Progress Notes (Signed)
Pt to transfer to telemetry unit, bed on unit 3 [place], report called to Rudd, Charity fundraiser. Pt will transfer to room 3 [address], 3E25.  Pt and wife informed earlier of plan for transfer today, waiting bed assignment then. Wife and daughter to return at 4pm, will attempt to contact them to notify of new room number.

## 2016-05-20 NOTE — Progress Notes (Signed)
Pt's TEE moved up to this am for 1000, endo dept up already and picked up pt. Pt tried to call his daughter from his cell phone with no answer at time he called ~[phone].  Dr Gala Romney up on unit and spoke with pt again about procedure, pt agreeable (consent to be signed in endo, as pt in wheelchair going down at time doctor spoke with him).  RN  Attempted to call wife for MD to speak with, reached answering machine, attempted another number from pt's cell phone, no answer, then busy signal. Called back to wife home number and left a message and number to return call.

## 2016-05-20 NOTE — Progress Notes (Signed)
Advanced Heart Failure Rounding Note   Subjective:    Denies SOB/CP. Able to ambulate in the hall. No fevers or chills.     Creatinine 1.51  Objective:   Weight Range:  Vital Signs:   Temp:  [97.4 F (36.3 C)-97.8 F (36.6 C)] 97.4 F (36.3 C) (01/11 0729) Pulse Rate:  [70-80] 71 (01/11 0729) Resp:  [11-28] 21 (01/11 0729) BP: (104-118)/(43-71) 111/54 (01/11 0729) SpO2:  [87 %-96 %] 96 % (01/11 0729) Weight:  [181 lb 8 oz (82.3 kg)] 181 lb 8 oz (82.3 kg) (01/11 0500) Last BM Date: 05/17/16  Weight change: Filed Weights   05/18/16 0400 05/19/16 0500 05/20/16 0500  Weight: 177 lb 7.5 oz (80.5 kg) 181 lb 7 oz (82.3 kg) 181 lb 8 oz (82.3 kg)    Intake/Output:   Intake/Output Summary (Last 24 hours) at 05/20/16 0755 Last data filed at 05/20/16 0400  Gross per 24 hour  Intake             1330 ml  Output              600 ml  Net              730 ml     Physical Exam: CVP ~3-4 General:  Lying in bed . No resp difficulty HEENT: normal Neck: supple. JVP flat  RIJ TLC . Carotids 2+ bilat; no bruits. No lymphadenopathy or thryomegaly appreciated. Cor: PMI laterally displaced. Regular rate & rhythm. No rubs, gallops or murmurs. Lungs:Clear except decreased in RLL Abdomen: soft, nontender, nondistended. No hepatosplenomegaly. No bruits or masses. Good bowel sounds. Extremities: no cyanosis, clubbing, rash, edema Neuro: alert & orientedx3, cranial nerves grossly intact. moves all 4 extremities w/o difficulty. Affect pleasant  Telemetry: AV paced 70s  Labs: Basic Metabolic Panel:  Recent Labs Lab 05/16/16 0510 05/17/16 0451 05/18/16 0545 05/19/16 0420 05/20/16 0340  NA 138 141 143 140 140  K 3.6 3.4* 3.4* 3.6 3.9  CL 101 103 107 110 108  CO2 25 30 29 26 24   GLUCOSE 164* 93 100* 76 89  BUN 50* 55* 46* 34* 32*  CREATININE 2.79* 2.49* 1.90* 1.47* 1.51*  CALCIUM 9.3 9.1 8.9 8.7* 8.5*  MG 2.2 2.2 2.1 2.1 2.1  PHOS 3.3 4.2 2.8 2.6 2.8    Liver Function  Tests:  Recent Labs Lab 05/15/16 0547 05/20/16 0340  AST 31 19  ALT 39 18  ALKPHOS 126 93  BILITOT 1.6* 1.3*  PROT 5.6* 5.0*  ALBUMIN 3.4* 2.9*   No results for input(s): LIPASE, AMYLASE in the last 168 hours. No results for input(s): AMMONIA in the last 168 hours.  CBC:  Recent Labs Lab 05/15/16 0547 05/16/16 0510 05/17/16 0451 05/18/16 0545 05/19/16 0420 05/20/16 0340  WBC 10.9* 21.1* 10.7* 6.2 6.3 6.1  NEUTROABS 10.3*  --   --   --   --   --   HGB 14.3 14.7 13.9 12.7* 13.0 12.8*  HCT 42.2 43.6 42.4 38.8* 39.0 38.7*  MCV 97.9 97.1 100.0 100.0 98.5 99.5  PLT 128* 180 143* 115* 128* 122*    Cardiac Enzymes:  Recent Labs Lab 05/15/16 0547 05/15/16 1028 05/15/16 1824  TROPONINI 0.07* 0.06* 0.06*    BNP: BNP (last 3 results)  Recent Labs  02/04/16 1541 03/09/16 1105 04/20/16 1059  BNP 1,594.5* 713.4* 1,061.4*    ProBNP (last 3 results) No results for input(s): PROBNP in the last 8760 hours.    Other results:  Imaging: No results found.   Medications:     Scheduled Medications: . allopurinol  100 mg Oral Daily  . amiodarone  200 mg Oral Daily  . apixaban  2.5 mg Oral BID  . atorvastatin  20 mg Oral q1800  . famotidine  20 mg Oral Daily  . feeding supplement (ENSURE ENLIVE)  237 mL Oral BID BM  . gabapentin  300 mg Oral QHS  . insulin aspart  0-15 Units Subcutaneous TID WC  . insulin aspart  0-5 Units Subcutaneous QHS  . insulin glargine  30 Units Subcutaneous Q24H  . mouth rinse  15 mL Mouth Rinse BID  . sodium chloride flush  10-40 mL Intracatheter Q12H  . vancomycin  1,000 mg Intravenous Q24H    Infusions: . sodium chloride 20 mL/hr at 05/20/16 0401    PRN Medications: sodium chloride flush, zolpidem   Assessment:   1. Septic shock    --BCX from [facility] 2/2 staph epi 2. CAP 3. Acute respiratory failure 4. Acute on chronic systolic HF    --Due to NICM EF < 20%. S/p CRT-D (MDT) 5. Acute on chronic renal failure  stage IV 6. PAF s/p DC-CV in 10/17   Plan/Discussion:    Suspect main issue is PNA/sepsis. BCX from [place] 2/2 staph epi. May be contaminant but can also be real. Dr Gala Romney spoke with Dr. Lafe Garin who spoke with ID. Will plan 7 days of IV vancomycin as long as no device infection. Will plan TEE today. morrow  Todays CO-OX pending.  SBP improved today. CVP 3. Holding diuretics. Unable to start bb with soft BP. No ace/arb/arni with elevated creatinine. SBP better today. Consider adding BB after TEE.   Creatinine down to 1.51  Continue eliquis 2.5 bid and amiodarone 200 daily.     OOB. Plan for HHPT.   Length of Stay: 5   Amy Clegg NP-C  05/20/2016, 7:55 AM  Advanced Heart Failure Team Pager [phone] (M-F; 7a - 4p)  Please contact [facility] for night-coverage after hours (4p -7a ) and weekends on [email or web address]  Patient seen and examined with Tonye Becket, NP. We discussed all aspects of the encounter. I agree with the assessment and plan as stated above.   BP improving today. Hopefully can start adding back his HF meds later today.  Bcx from [place] 2/2 staph epi. May be contaminant but can also be real. I spoke with Dr. Lafe Garin yesterday  who spoke with ID. Will plan 7 days of IV vancomycin as long as no device infection. Will plan TEE today. If TEE negative for vegetation possibly home Friday possibly after vancomycin completed.   Continue Eliquis and amio for AF. Renal function back to baseline.   Continue to ambulate.   Amy Clegg,MD 7:55 AM

## 2016-05-20 NOTE — Progress Notes (Signed)
Physical Therapy Treatment Patient Details Name: LOUCAS LINSCHEID MRN: [id number] DOB: 06/15/1929 Today's Date: 05/27/16    History of Present Illness 83yo man with PMHx of chronic systolic CHF (EF 73-40% per last echo here), nonischemic cardiomyopathy, LBBB s/p biV ICD, AFib on Eliquis, COPD, and CKD stage IV who presented to Hospital with shortness of breath in setting of possible PNA/sepsis.    PT Comments    Pt continues to make good progress with mobility and endurance.   Follow Up Recommendations  Supervision/Assistance - 24 hour;Home health PT     Equipment Recommendations  None recommended by PT (Pt to borrow rolling walker)    Recommendations for Other Services       Precautions / Restrictions Precautions Precautions: Fall Restrictions Weight Bearing Restrictions: No    Mobility  Bed Mobility               General bed mobility comments: Pt sitting EOB  Transfers Overall transfer level: Needs assistance Equipment used: None Transfers: Sit to/from Stand Sit to Stand: Min guard         General transfer comment: Incr time  Ambulation/Gait Ambulation/Gait assistance: Supervision Ambulation Distance (Feet): 350 Feet Assistive device: Rolling walker (2 wheeled);Straight cane Gait Pattern/deviations: Step-through pattern;Decreased stride length;Drifts right/left;Narrow base of support Gait velocity: decr Gait velocity interpretation: Below normal speed for age/gender General Gait Details: Slightly unsteady initially but no LOB. Pt feels more confident with walker. Amb on RA. Unable to read SpO2 with amb but 98% upon sitting down after amb   Stairs            Wheelchair Mobility    Modified Rankin (Stroke Patients Only)       Balance Overall balance assessment: Needs assistance Sitting-balance support: No upper extremity supported;Feet supported Sitting balance-Leahy Scale: Good     Standing balance support: No upper extremity  supported Standing balance-Leahy Scale: Fair Standing balance comment: Stable with static standing                    Cognition Arousal/Alertness: Awake/alert Behavior During Therapy: WFL for tasks assessed/performed Overall Cognitive Status: Within Functional Limits for tasks assessed                      Exercises      General Comments        Pertinent Vitals/Pain Pain Assessment: No/denies pain    Home Living                      Prior Function            PT Goals (current goals can now be found in the care plan section) Progress towards PT goals: Progressing toward goals    Frequency    Min 3X/week      PT Plan Current plan remains appropriate    Co-evaluation             End of Session Equipment Utilized During Treatment: Gait belt Activity Tolerance: Patient tolerated treatment well Patient left: with call bell/phone within reach;in bed;with bed alarm set ([place] tech present for echo)     Time: 3709-6438 PT Time Calculation (min) (ACUTE ONLY): 20 min  Charges:  $Gait Training: 8-22 mins                    G CodesAngelina Ok Maycok May 27, 2016, 3:21 PM [employer] PT [phone]

## 2016-05-20 NOTE — Progress Notes (Signed)
  Echocardiogram Echocardiogram Transesophageal has been performed.  Arvil Chaco 05/20/2016, 10:46 AM

## 2016-05-20 NOTE — Plan of Care (Signed)
Problem: Tissue Perfusion: Goal: Risk factors for ineffective tissue perfusion will decrease Outcome: Not Progressing Chronic disease

## 2016-05-20 NOTE — CV Procedure (Signed)
    TRANSESOPHAGEAL ECHOCARDIOGRAM   NAME:  Scott Parsons   MRN: [id number] DOB:  [birth year]   ADMIT DATE: 05/15/2016  INDICATIONS: Bacteremia   PROCEDURE:   Informed consent was obtained prior to the procedure. The risks, benefits and alternatives for the procedure were discussed and the patient comprehended these risks.  Risks include, but are not limited to, cough, sore throat, vomiting, nausea, somnolence, esophageal and stomach trauma or perforation, bleeding, low blood pressure, aspiration, pneumonia, infection, trauma to the teeth and death.    After a procedural time-out, the patient was given 2 mg versed and50 mcg fentanyl for moderate sedation.  The oropharynx was anesthetized 10 cc of topical 1% viscous lidocaine.  The transesophageal probe was inserted in the esophagus and stomach without difficulty and multiple views were obtained.    COMPLICATIONS:    There were no immediate complications.  FINDINGS:  LEFT VENTRICLE: Dilated EF = 15%. Severe global HK  RIGHT VENTRICLE: Normal size and function. Pacing wire ok. No vegetation.   LEFT ATRIUM: Severely dilated  LEFT ATRIAL APPENDAGE: Not well visualized  RIGHT ATRIUM: Normal. Pacing wires and central line ok. No vegetation.   AORTIC VALVE:  [place]. Mildly calcified. No. AI/AS. No vegetation  MITRAL VALVE:    Normal. Moderate MR. No vegetation.   TRICUSPID VALVE: Normal. Mild TR. No vegetation/   PULMONIC VALVE: Grossly normal. Trivial PI. No vegetation  INTERATRIAL SEPTUM: No PFO or ASD.  PERICARDIUM: No effusion  DESCENDING AORTA: Severe plaque   CONCLUSION:   No TEE evidence of endocarditis.   Laurin Paulo,MD 10:14 AM

## 2016-05-20 NOTE — Progress Notes (Signed)
Patient refuses CPAP at this time. States that he has had three and cannot tolerate them. RT will continue to monitor as needed.

## 2016-05-20 NOTE — Progress Notes (Signed)
   TEE   LVEF 15%. No evidence endocarditis.   Bensimhon, Daniel,MD 10:17 AM

## 2016-05-20 NOTE — Progress Notes (Signed)
Pt refuses CPAP at night, states that he uses 2L O2 via nasal canula while sleeping. No O2 during waking hours.

## 2016-05-20 NOTE — Progress Notes (Signed)
PROGRESS NOTE  Scott Parsons:[id number] DOB: July 27, 1929 DOA: 05/15/2016 PCP: [name], MD   LOS: 5 days   Brief Narrative: 83yo WM PMHx CVA, Chronic Systolic CHF (EF 81-19% per last echo here), Nonischemic Cardiomyopathy, LBBB S/P biV ICD, A-Fib on Eliquis, CAD native artery, COPD, OSA, and CKD stage IV, Renal artery stenosis. Who presented to [facility] with shortness of breath. He developed SOB and had taken an extra dose of Lasix 60 mgat home, but remained symptomatic so went to the hospital. At [facility] he was placed on BiPAP but oxygen saturation remained in the 70s and thus he was intubated. He received Lasix 80 mg IV, Solu-medrol with concern for possible COPD playing a role in his SOB, and broad spectrum antibiotics. His initial ABG revealed pH 7.28/pCO2 53/pO2 60 and after intubation improved to pH 7.34/pCO2 45/pO2 87. He was hypotensive so Levophed and Dobutamine had been initiated. He was then transferred to [facility] for further care.   Assessment & Plan: Principal Problem:   Acute respiratory failure with hypoxia (HCC) Active Problems:   Peripheral arterial disease (HCC)   Nonischemic cardiomyopathy (HCC)   ICD (implantable cardioverter-defibrillator), dual, in situ   Paroxysmal a-fib (HCC)   Pressure injury of skin   Acute on chronic renal failure (HCC)   Cardiogenic shock (HCC)   Acute hypoxemic respiratory failure (HCC)   COPD mixed type (HCC)   Persistent atrial fibrillation (HCC)   LBBB (left bundle branch block)   Acute renal failure with acute tubular necrosis superimposed on stage 4 chronic kidney disease (HCC)   Uncontrolled type 2 diabetes mellitus with complication (HCC)   Sepsis (HCC)   Acute hypoxic respiratory failure likely due to CHF exacerbation vs CAP - Resolved, he was initially on antibiotics of vancomycin and cefepime, felt less likely to have community-acquired pneumonia given clearing of infiltrates after diuresis, cefepime  discontinued on 1/8, continue vancomycin for staph epidermidis bacteremia  COPD  - titrate O2 to maintain SPO2 89 to 93%  OSA  - CPAP   Acute on Chronic Systolic and Diastolic CHF Exacerbation / Nonischemic cardiomyopathy with hypotension  - Hypotension requiring dobutamine and levophed initially>>Resolved, patient received a liter normal saline on 1/9 due to intravascular depletion  Persistent AFib  - Currently in NSR  - Amiodarone 200 mg daily  - Eliquis   HTN - Borderline hypotensive initially, blood pressure is now improved  LBBB  - s/p biV ICD  PVD  HLD  Acute renal failure on CKD Stage IV (baseline Cr 1.8 to 2.0)   - Cr 2.3 at [facility], improving, now 1.51 and stable  Hx renal artery stenosis s/p L renal artery stent in 2007  Staph epidermidis bacteremia 2/2 bottles - will complete course vancomycin 7 day course, 2d echo without significant vegetations, d/w/ Dr. Orvan Falconer. Last day 1/12. TEE today negative for endocarditis and cultures have remained negative.    Hypothyroidism - Elevated TSH (10 at [facility]), likely due to acute illness - free T4 1.4 - resume synthroid  DM type II controlled with complication - 1/6 Hemoglobin A1c= 6.9 - Lantus 30 units daily - SSI   Hypokalemia - Potassium goal> 4 - K-Dur 40 mEq   DVT prophylaxis: Eliquis Code Status: Full code Family Communication: no family at bedside Disposition Plan: home Friday  Consultants:   Cardiology   PCCM  Procedures:   2D echo 1/19 Echogardiogram; Left ventricle: severely dilated. LVEF =15-20%. Diffuse hypokinesis.  -(grade 2 diastolic dysfunction).  - Mitral valve:  There was mild to moderate regurgitation.  Antimicrobials:  Vancomycin 1/6 >> day 6/7 end 11/12  Subjective: - no chest pain, shortness of breath, no abdominal pain, nausea or vomiting.   Objective: Vitals:   05/20/16 1010 05/20/16 1015 05/20/16 1023 05/20/16 1035  BP: (!) 131/58 (!) 114/40 (!) 113/45  (!) 118/43  Pulse: 71 73 72 70  Resp: 15 15 17 15   Temp:   97.5 F (36.4 C)   TempSrc:   Oral   SpO2: 99% 97% 94% 97%  Weight:      Height:        Intake/Output Summary (Last 24 hours) at 05/20/16 1111 Last data filed at 05/20/16 0935  Gross per 24 hour  Intake              850 ml  Output              800 ml  Net               50 ml   Filed Weights   05/18/16 0400 05/19/16 0500 05/20/16 0500  Weight: 80.5 kg (177 lb 7.5 oz) 82.3 kg (181 lb 7 oz) 82.3 kg (181 lb 8 oz)    Examination: Constitutional: NAD Vitals:   05/20/16 1010 05/20/16 1015 05/20/16 1023 05/20/16 1035  BP: (!) 131/58 (!) 114/40 (!) 113/45 (!) 118/43  Pulse: 71 73 72 70  Resp: 15 15 17 15   Temp:   97.5 F (36.4 C)   TempSrc:   Oral   SpO2: 99% 97% 94% 97%  Weight:      Height:       Eyes: PERRL, lids and conjunctivae normal Respiratory: clear to auscultation bilaterally, no wheezing, no crackles.  Cardiovascular: Regular rate and rhythm, no murmurs / rubs / gallops.  Abdomen: no tenderness. Bowel sounds positive.  Musculoskeletal: no clubbing / cyanosis Neurologic: non focal    Data Reviewed: I have personally reviewed following labs and imaging studies  CBC:  Recent Labs Lab 05/15/16 0547 05/16/16 0510 05/17/16 0451 05/18/16 0545 05/19/16 0420 05/20/16 0340  WBC 10.9* 21.1* 10.7* 6.2 6.3 6.1  NEUTROABS 10.3*  --   --   --   --   --   HGB 14.3 14.7 13.9 12.7* 13.0 12.8*  HCT 42.2 43.6 42.4 38.8* 39.0 38.7*  MCV 97.9 97.1 100.0 100.0 98.5 99.5  PLT 128* 180 143* 115* 128* 122*   Basic Metabolic Panel:  Recent Labs Lab 05/16/16 0510 05/17/16 0451 05/18/16 0545 05/19/16 0420 05/20/16 0340  NA 138 141 143 140 140  K 3.6 3.4* 3.4* 3.6 3.9  CL 101 103 107 110 108  CO2 25 30 29 26 24   GLUCOSE 164* 93 100* 76 89  BUN 50* 55* 46* 34* 32*  CREATININE 2.79* 2.49* 1.90* 1.47* 1.51*  CALCIUM 9.3 9.1 8.9 8.7* 8.5*  MG 2.2 2.2 2.1 2.1 2.1  PHOS 3.3 4.2 2.8 2.6 2.8   GFR: Estimated  Creatinine Clearance: 42.3 mL/min (by C-G formula based on SCr of 1.51 mg/dL (H)). Liver Function Tests:  Recent Labs Lab 05/15/16 0547 05/20/16 0340  AST 31 19  ALT 39 18  ALKPHOS 126 93  BILITOT 1.6* 1.3*  PROT 5.6* 5.0*  ALBUMIN 3.4* 2.9*   No results for input(s): LIPASE, AMYLASE in the last 168 hours. No results for input(s): AMMONIA in the last 168 hours. Coagulation Profile:  Recent Labs Lab 05/15/16 0547  INR 1.07   Cardiac Enzymes:  Recent Labs Lab 05/15/16  0865 05/15/16 1028 05/15/16 1824  TROPONINI 0.07* 0.06* 0.06*   BNP (last 3 results) No results for input(s): PROBNP in the last 8760 hours. HbA1C: No results for input(s): HGBA1C in the last 72 hours. CBG:  Recent Labs Lab 05/19/16 0828 05/19/16 1103 05/19/16 1600 05/19/16 2150 05/20/16 0728  GLUCAP 85 242* 91 195* 97   Lipid Profile: No results for input(s): CHOL, HDL, LDLCALC, TRIG, CHOLHDL, LDLDIRECT in the last 72 hours. Thyroid Function Tests:  Recent Labs  05/19/16 0420  FREET4 1.40*   Anemia Panel: No results for input(s): VITAMINB12, FOLATE, FERRITIN, TIBC, IRON, RETICCTPCT in the last 72 hours. Urine analysis:    Component Value Date/Time   COLORURINE YELLOW 05/15/2016 0550   APPEARANCEUR CLEAR 05/15/2016 0550   LABSPEC 1.008 05/15/2016 0550   PHURINE 5.0 05/15/2016 0550   GLUCOSEU >=500 (A) 05/15/2016 0550   HGBUR LARGE (A) 05/15/2016 0550   BILIRUBINUR NEGATIVE 05/15/2016 0550   KETONESUR NEGATIVE 05/15/2016 0550   PROTEINUR NEGATIVE 05/15/2016 0550   NITRITE NEGATIVE 05/15/2016 0550   LEUKOCYTESUR NEGATIVE 05/15/2016 0550   Sepsis Labs: Invalid input(s): PROCALCITONIN, LACTICIDVEN  Recent Results (from the past 240 hour(s))  MRSA PCR Screening     Status: None   Collection Time: 05/15/16  5:39 AM  Result Value Ref Range Status   MRSA by PCR NEGATIVE NEGATIVE Final    Comment:        The GeneXpert MRSA Assay (FDA approved for NASAL specimens only), is one  component of a comprehensive MRSA colonization surveillance program. It is not intended to diagnose MRSA infection nor to guide or monitor treatment for MRSA infections.   Culture, blood (Routine X 2) w Reflex to ID Panel     Status: None (Preliminary result)   Collection Time: 05/15/16  5:47 AM  Result Value Ref Range Status   Specimen Description BLOOD RIGHT HAND  Final   Special Requests BOTTLES DRAWN AEROBIC AND ANAEROBIC  5CC  Final   Culture NO GROWTH 4 DAYS  Final   Report Status PENDING  Incomplete  Culture, Urine     Status: None   Collection Time: 05/15/16  5:50 AM  Result Value Ref Range Status   Specimen Description URINE, CATHETERIZED  Final   Special Requests NONE  Final   Culture NO GROWTH  Final   Report Status 05/16/2016 FINAL  Final  Culture, blood (Routine X 2) w Reflex to ID Panel     Status: None (Preliminary result)   Collection Time: 05/15/16  5:59 AM  Result Value Ref Range Status   Specimen Description BLOOD LEFT HAND  Final   Special Requests BOTTLES DRAWN AEROBIC AND ANAEROBIC  3CC  Final   Culture NO GROWTH 4 DAYS  Final   Report Status PENDING  Incomplete  Culture, blood (Routine X 2) w Reflex to ID Panel     Status: None (Preliminary result)   Collection Time: 05/17/16  6:25 PM  Result Value Ref Range Status   Specimen Description BLOOD LEFT FOREARM  Final   Special Requests BOTTLES DRAWN AEROBIC AND ANAEROBIC 5CC  Final   Culture NO GROWTH 2 DAYS  Final   Report Status PENDING  Incomplete  Culture, blood (Routine X 2) w Reflex to ID Panel     Status: None (Preliminary result)   Collection Time: 05/17/16  8:00 PM  Result Value Ref Range Status   Specimen Description BLOOD BLOOD LEFT FOREARM  Final   Special Requests BOTTLES DRAWN AEROBIC AND ANAEROBIC  5CC  Final   Culture NO GROWTH 2 DAYS  Final   Report Status PENDING  Incomplete      Radiology Studies: No results found.   Scheduled Meds: . allopurinol  100 mg Oral Daily  . amiodarone   200 mg Oral Daily  . apixaban  2.5 mg Oral BID  . atorvastatin  20 mg Oral q1800  . famotidine  20 mg Oral Daily  . feeding supplement (ENSURE ENLIVE)  237 mL Oral BID BM  . gabapentin  300 mg Oral QHS  . insulin aspart  0-15 Units Subcutaneous TID WC  . insulin aspart  0-5 Units Subcutaneous QHS  . insulin glargine  30 Units Subcutaneous Q24H  . mouth rinse  15 mL Mouth Rinse BID  . sodium chloride flush  10-40 mL Intracatheter Q12H  . vancomycin  1,000 mg Intravenous Q24H   Continuous Infusions:  Pamella Pert, MD, PhD [facility] Pager [phone]  If 7PM-7AM, please contact night-coverage [email or web address] Password [email or web address] 05/20/2016, 11:11 AM

## 2016-05-20 NOTE — Interval H&P Note (Signed)
History and Physical Interval Note:  05/20/2016 10:01 AM  Scott Parsons  has presented today for surgery, with the diagnosis of BACTEREMIA  The various methods of treatment have been discussed with the patient and family. After consideration of risks, benefits and other options for treatment, the patient has consented to  Procedure(s): TRANSESOPHAGEAL ECHOCARDIOGRAM (TEE) (N/A) as a surgical intervention .  The patient's history has been reviewed, patient examined, no change in status, stable for surgery.  I have reviewed the patient's chart and labs.  Questions were answered to the patient's satisfaction.     Bensimhon, Reuel Boom

## 2016-05-20 NOTE — H&P (View-Only) (Signed)
Advanced Heart Failure Rounding Note   Subjective:    Yesterday received 1 liter NS. Dizziness resolved.   Denies SOB/CP.   Sed Rate 14  Creatinine 2.79>2.4>1.9 >1.47   Objective:   Weight Range:  Vital Signs:   Temp:  [97.6 F (36.4 C)-98 F (36.7 C)] 98 F (36.7 C) (01/10 0400) Pulse Rate:  [67-77] 70 (01/10 0700) Resp:  [9-32] 9 (01/10 0700) BP: (90-127)/(43-90) 105/58 (01/10 0700) SpO2:  [92 %-100 %] 100 % (01/10 0700) Weight:  [181 lb 7 oz (82.3 kg)] 181 lb 7 oz (82.3 kg) (01/10 0500) Last BM Date: 05/17/16  Weight change: Filed Weights   05/17/16 0437 05/18/16 0400 05/19/16 0500  Weight: 177 lb 11.1 oz (80.6 kg) 177 lb 7.5 oz (80.5 kg) 181 lb 7 oz (82.3 kg)    Intake/Output:   Intake/Output Summary (Last 24 hours) at 05/19/16 0727 Last data filed at 05/19/16 0000  Gross per 24 hour  Intake          1005.48 ml  Output              535 ml  Net           470.48 ml     Physical Exam: CVP ~3 General:  Lying in bed . No resp difficulty HEENT: normal Neck: supple. JVP flat. RIJ TLC . Carotids 2+ bilat; no bruits. No lymphadenopathy or thryomegaly appreciated. Cor: PMI laterally displaced. Regular rate & rhythm. No rubs, gallops or murmurs. Lungs:Clear except decreased in RLL Abdomen: soft, nontender, nondistended. No hepatosplenomegaly. No bruits or masses. Good bowel sounds. Extremities: no cyanosis, clubbing, rash, edema Neuro: alert & orientedx3, cranial nerves grossly intact. moves all 4 extremities w/o difficulty. Affect pleasant  Telemetry: AV paced 70s  Labs: Basic Metabolic Panel:  Recent Labs Lab 05/15/16 0547 05/16/16 0510 05/17/16 0451 05/18/16 0545 05/19/16 0420  NA 134* 138 141 143 140  K 4.8 3.6 3.4* 3.4* 3.6  CL 99* 101 103 107 110  CO2 21* '25 30 29 26  '$ GLUCOSE 371* 164* 93 100* 76  BUN 44* 50* 55* 46* 34*  CREATININE 2.86* 2.79* 2.49* 1.90* 1.47*  CALCIUM 8.5* 9.3 9.1 8.9 8.7*  MG 2.0 2.2 2.2 2.1 2.1  PHOS  --  3.3 4.2  2.8 2.6    Liver Function Tests:  Recent Labs Lab 05/15/16 0547  AST 31  ALT 39  ALKPHOS 126  BILITOT 1.6*  PROT 5.6*  ALBUMIN 3.4*   No results for input(s): LIPASE, AMYLASE in the last 168 hours. No results for input(s): AMMONIA in the last 168 hours.  CBC:  Recent Labs Lab 05/15/16 0547 05/16/16 0510 05/17/16 0451 05/18/16 0545 05/19/16 0420  WBC 10.9* 21.1* 10.7* 6.2 6.3  NEUTROABS 10.3*  --   --   --   --   HGB 14.3 14.7 13.9 12.7* 13.0  HCT 42.2 43.6 42.4 38.8* 39.0  MCV 97.9 97.1 100.0 100.0 98.5  PLT 128* 180 143* 115* 128*    Cardiac Enzymes:  Recent Labs Lab 05/15/16 0547 05/15/16 1028 05/15/16 1824  TROPONINI 0.07* 0.06* 0.06*    BNP: BNP (last 3 results)  Recent Labs  02/04/16 1541 03/09/16 1105 04/20/16 1059  BNP 1,594.5* 713.4* 1,061.4*    ProBNP (last 3 results) No results for input(s): PROBNP in the last 8760 hours.    Other results:  Imaging: No results found.   Medications:     Scheduled Medications: . allopurinol  100 mg Oral Daily  .  amiodarone  200 mg Oral Daily  . apixaban  2.5 mg Oral BID  . atorvastatin  20 mg Oral q1800  . famotidine  20 mg Oral Daily  . feeding supplement (ENSURE ENLIVE)  237 mL Oral BID BM  . gabapentin  300 mg Oral QHS  . insulin aspart  1-3 Units Subcutaneous Q4H  . insulin glargine  30 Units Subcutaneous Q24H  . mouth rinse  15 mL Mouth Rinse BID  . sodium chloride flush  10-40 mL Intracatheter Q12H  . vancomycin  1,000 mg Intravenous Q24H    Infusions:   PRN Medications: sodium chloride flush, zolpidem   Assessment:   1. Septic shock    --BCX from [facility] 2/2 staph epi 2. CAP 3. Acute respiratory failure 4. Acute on chronic systolic HF    --Due to NICM EF < 20%. S/p CRT-D (MDT) 5. Acute on chronic renal failure stage IV 6. PAF s/p DC-CV in 10/17   Plan/Discussion:    Suspect main issue is PNA/sepsis. [place]. No sputum culture avaialble. Continue abx per CCM.    Todays CO-OX 68%. SBP improved today. CVP 3. Holding diuretics. Unable to start bb wit soft BP. No ace/arb/arni with elevated creatinine.   Creatinine down to 1.47    Continue eliquis 2.5 bid and amiodarone 200 daily.  ESR 15.  CXR improving.    OOB. Plan for HHPT.    Should be able to go home soon. ? Thursday.    Length of Stay: 4   Amy Clegg NP-C  05/19/2016, 7:27 AM  Advanced Heart Failure Team Pager [phone] (M-F; 7a - 4p)  Please contact [facility] for night-coverage after hours (4p -7a ) and weekends on [email or web address]  Patient seen and examined with Darrick Grinder, NP. We discussed all aspects of the encounter. I agree with the assessment and plan as stated above.   Continues to improve. Volume status better. Off pressors. BP too low to add back HF meds.   Bcx from [place] 2/2 staph epi. May be contaminant but can also be real. I spoke with Dr. Renne Crigler who spoke with ID. Will plan 7 days of IV vancomycin as long as no device infection. Will plan TEE tomorrow. Home Friday possibly after vancomycin completed.   Continue Eliquis and amio for AF. Renal function back to baseline.   Umar Patmon,MD 3:43 PM

## 2016-05-21 ENCOUNTER — Encounter (HOSPITAL_COMMUNITY): Payer: Self-pay | Admitting: Internal Medicine

## 2016-05-21 LAB — GLUCOSE, CAPILLARY
GLUCOSE-CAPILLARY: 165 mg/dL — AB (ref 65–99)
Glucose-Capillary: 82 mg/dL (ref 65–99)

## 2016-05-21 LAB — BASIC METABOLIC PANEL
ANION GAP: 7 (ref 5–15)
BUN: 30 mg/dL — ABNORMAL HIGH (ref 6–20)
CHLORIDE: 107 mmol/L (ref 101–111)
CO2: 25 mmol/L (ref 22–32)
Calcium: 8.7 mg/dL — ABNORMAL LOW (ref 8.9–10.3)
Creatinine, Ser: 1.56 mg/dL — ABNORMAL HIGH (ref 0.61–1.24)
GFR calc Af Amer: 45 mL/min — ABNORMAL LOW (ref >60)
GFR calc non Af Amer: 39 mL/min — ABNORMAL LOW (ref >60)
Glucose, Bld: 101 mg/dL — ABNORMAL HIGH (ref 65–99)
POTASSIUM: 3.6 mmol/L (ref 3.5–5.1)
Sodium: 139 mmol/L (ref 135–145)

## 2016-05-21 MED ORDER — FUROSEMIDE 40 MG PO TABS: 40.0000 mg | ORAL_TABLET | Freq: Every day | ORAL | Status: DC

## 2016-05-21 MED ORDER — VANCOMYCIN HCL IN DEXTROSE 750-5 MG/150ML-% IV SOLN
750.0000 mg | Freq: Once | INTRAVENOUS | Status: AC
  Administered 2016-05-21: 750 mg via INTRAVENOUS
  Filled 2016-05-21: qty 150

## 2016-05-21 MED ORDER — SPIRONOLACTONE 25 MG PO TABS: 12.5000 mg | ORAL_TABLET | Freq: Every day | ORAL | 6 refills | Status: DC

## 2016-05-21 MED ORDER — FUROSEMIDE 40 MG PO TABS: 60.0000 mg | ORAL_TABLET | Freq: Every day | ORAL | Status: DC

## 2016-05-21 MED ORDER — FUROSEMIDE 40 MG PO TABS: 40.0000 mg | ORAL_TABLET | Freq: Every day | ORAL | 6 refills | Status: DC

## 2016-05-21 MED ORDER — SPIRONOLACTONE 25 MG PO TABS
12.5000 mg | ORAL_TABLET | Freq: Every day | ORAL | Status: DC
  Administered 2016-05-21: 12.5 mg via ORAL
  Filled 2016-05-21: qty 1

## 2016-05-21 NOTE — Progress Notes (Signed)
Call placed to CCMD to notify of telemetry monitoring d/c.   

## 2016-05-21 NOTE — Care Management Important Message (Signed)
Important Message  Patient Details  Name: CLARKSON CZAPLICKI MRN: [id number] Date of Birth: [birth year]   Medicare Important Message Given:  Yes    Kamali Sakata Stefan Church 05/21/2016, 12:36 PM

## 2016-05-21 NOTE — Progress Notes (Signed)
IJ Removed successfully, pt advised to remain flat x 30 minutes per IV team.   Peripheral IV access d/c'd. Pts Vitals are stable. He is not in distress, he denies complaints. Pt's family is at the bedside for discharge education. Pt received discharge paperwork.

## 2016-05-21 NOTE — Progress Notes (Signed)
Advanced Heart Failure Rounding Note   Subjective:    Feeling good this am. No fever, chills, or SOB ambulating halls. Had episode of confusion overnight but now resolved.    Creatinine 1.56. Weight stable.   TEE 05/20/16 LVEF 15%, no evidence of endocarditis.   Objective:   Weight Range:  Vital Signs:   Temp:  [97.4 F (36.3 C)-98.4 F (36.9 C)] 97.5 F (36.4 C) (01/12 0403) Pulse Rate:  [67-77] 77 (01/12 0403) Resp:  [13-19] 18 (01/12 0403) BP: (101-166)/(40-97) 137/59 (01/12 0403) SpO2:  [94 %-99 %] 97 % (01/12 0403) Weight:  [181 lb 3.2 oz (82.2 kg)-181 lb 11.2 oz (82.4 kg)] 181 lb 11.2 oz (82.4 kg) (01/12 0403) Last BM Date: 05/17/16  Weight change: Filed Weights   05/20/16 0500 05/20/16 1653 05/21/16 0403  Weight: 181 lb 8 oz (82.3 kg) 181 lb 3.2 oz (82.2 kg) 181 lb 11.2 oz (82.4 kg)    Intake/Output:   Intake/Output Summary (Last 24 hours) at 05/21/16 0811 Last data filed at 05/21/16 0427  Gross per 24 hour  Intake              895 ml  Output              975 ml  Net              -80 ml     Physical Exam: JVP 4-5 General:  Sitting in recliner. NAD.  HEENT: NAD Neck: supple. JVP flat  RIJ TLC . Carotids 2+ bilat; no bruits. No thyromegaly or nodule noted.  Cor: PMI laterally displaced. RRR. No M/G/R Lungs: Mildly decreased RLL, otherwise clear Abdomen: soft, NT, ND, no HSM. No bruits or masses. +BS  Extremities: no cyanosis, clubbing, rash. No peripheral edema Neuro: alert & orientedx3, cranial nerves grossly intact. moves all 4 extremities w/o difficulty. Affect pleasant  Telemetry: Reviewed personally, stable -> AV paced 70s  Labs: Basic Metabolic Panel:  Recent Labs Lab 05/16/16 0510 05/17/16 0451 05/18/16 0545 05/19/16 0420 05/20/16 0340 05/21/16 0417  NA 138 141 143 140 140 139  K 3.6 3.4* 3.4* 3.6 3.9 3.6  CL 101 103 107 110 108 107  CO2 25 30 29 26 24 25   GLUCOSE 164* 93 100* 76 89 101*  BUN 50* 55* 46* 34* 32* 30*  CREATININE  2.79* 2.49* 1.90* 1.47* 1.51* 1.56*  CALCIUM 9.3 9.1 8.9 8.7* 8.5* 8.7*  MG 2.2 2.2 2.1 2.1 2.1  --   PHOS 3.3 4.2 2.8 2.6 2.8  --     Liver Function Tests:  Recent Labs Lab 05/15/16 0547 05/20/16 0340  AST 31 19  ALT 39 18  ALKPHOS 126 93  BILITOT 1.6* 1.3*  PROT 5.6* 5.0*  ALBUMIN 3.4* 2.9*   No results for input(s): LIPASE, AMYLASE in the last 168 hours. No results for input(s): AMMONIA in the last 168 hours.  CBC:  Recent Labs Lab 05/15/16 0547 05/16/16 0510 05/17/16 0451 05/18/16 0545 05/19/16 0420 05/20/16 0340  WBC 10.9* 21.1* 10.7* 6.2 6.3 6.1  NEUTROABS 10.3*  --   --   --   --   --   HGB 14.3 14.7 13.9 12.7* 13.0 12.8*  HCT 42.2 43.6 42.4 38.8* 39.0 38.7*  MCV 97.9 97.1 100.0 100.0 98.5 99.5  PLT 128* 180 143* 115* 128* 122*    Cardiac Enzymes:  Recent Labs Lab 05/15/16 0547 05/15/16 1028 05/15/16 1824  TROPONINI 0.07* 0.06* 0.06*    BNP: BNP (last  3 results)  Recent Labs  02/04/16 1541 03/09/16 1105 04/20/16 1059  BNP 1,594.5* 713.4* 1,061.4*    ProBNP (last 3 results) No results for input(s): PROBNP in the last 8760 hours.    Other results:  Imaging: No results found.   Medications:     Scheduled Medications: . allopurinol  100 mg Oral Daily  . amiodarone  200 mg Oral Daily  . apixaban  2.5 mg Oral BID  . atorvastatin  20 mg Oral q1800  . famotidine  20 mg Oral Daily  . feeding supplement (ENSURE ENLIVE)  237 mL Oral BID BM  . gabapentin  300 mg Oral QHS  . insulin aspart  0-15 Units Subcutaneous TID WC  . insulin aspart  0-5 Units Subcutaneous QHS  . insulin glargine  30 Units Subcutaneous Q24H  . mouth rinse  15 mL Mouth Rinse BID  . sodium chloride flush  10-40 mL Intracatheter Q12H  . vancomycin  1,000 mg Intravenous Q24H    Infusions:   PRN Medications: sodium chloride flush, zolpidem   Assessment:   1. Septic shock    --BCX from [facility] 2/2 staph epi 2. CAP 3. Acute respiratory failure 4.  Acute on chronic systolic HF    --Due to NICM EF < 20%. S/p CRT-D (MDT) 5. Acute on chronic renal failure stage IV 6. PAF s/p DC-CV in 10/17   Plan/Discussion:    Primary issue this admit PNA/sepsis. BCX from [place] 2/2 staph epi.  - Can be contaminant but treated with 7 days IV vanc for coverage. Last dose today.   TEE 05/20/16 LVEF 15%, no evidence of Endocarditis.  [place] stable yesterday. SBP lower than baseline. Continue to hold BB for now.   Add back decreased lasix at 40 mg daily tomorrow.  Start spironolactone 12.5 mg daily today.   Creatinine improved.   Continue eliquis 2.5 bid and amiodarone 200 daily.     Will have close follow up next week, 05/28/16.  HF meds for home Lasix 40 mg daily Spiro 12.5 mg daily Eliquis 2.5 mg BID Amio 200 mg daily Atorvastatin 20 mg daily  Length of Stay: 6  Graciella Freer, PA-C  05/21/2016, 8:11 AM  Advanced Heart Failure Team Pager [phone] (M-F; 7a - 4p)  Please contact [facility] for night-coverage after hours (4p -7a ) and weekends on [email or web address]   Patient seen and examined with Otilio Saber, PA-C. We discussed all aspects of the encounter. I agree with the assessment and plan as stated above.   He is much improved. Walking halls independently. TEE yesterday with low EF (15%) but no vegetations. Ok for d/c today We have cut HF meds back a little bit from home meds due to low BP. Will adjust in clinic next week. Needs HHPT.   Bensimhon, Daniel,MD 8:41 AM

## 2016-05-21 NOTE — Progress Notes (Signed)
Patient woke up from sleep disoriented and confused. Patient pulled out IV and tele monitor. Patient was reoriented and helped back in bed. Bed alarm activated. Call bell within reach. Will continue to monitor.

## 2016-05-21 NOTE — Discharge Summary (Signed)
**Note Scott-Identified via Obfuscation** Physician Discharge Summary  Scott Parsons WYO:[id number] DOB: 1929-12-02 DOA: 05/15/2016  PCP: Scott Bott, MD  Admit date: 05/15/2016 Discharge date: 05/21/2016  Admitted From: home Disposition:  home  Recommendations for Outpatient Follow-up:  1. Follow up with PCP in 1-2 weeks  Home Health: PT Equipment/Devices: none  Discharge Condition: stable CODE STATUS: Full Diet recommendation: heart healthy  HPI: Per Dr. Arcelia Parsons, Mr. Scott Parsons is a 83yo man with PMHx of chronic systolic CHF (EF 77-41% per last echo here), nonischemic cardiomyopathy, LBBB s/p biV ICD, AFib on Eliquis, COPD, and CKD stage IV who presented to [facility] with shortness of breath. He developed SOB and had taken an extra dose of Lasix 60 mg at home, but remained symptomatic so went to the [facility]. At Scott [facility] he was placed on BiPAP but oxygen saturation remained in the 70s and thus he was intubated. He received Lasix 80 mg IV, Solu-medrol with concern for possible COPD playing a role in his SOB, and broad spectrum antibiotics. His initial ABG revealed pH 7.28/pCO2 53/pO2 60 and after intubation improved to pH 7.34/pCO2 45/pO2 87. He was hypotensive so Levophed and Dobutamine had been initiated. He was then transferred to Scott [facility]ons for further care.   Parsons Course: Discharge Diagnoses:  Principal Problem:   Acute respiratory failure with hypoxia (Scott [facility]ons) Active Problems:   Peripheral arterial disease (HCC)   Nonischemic cardiomyopathy (HCC)   ICD (implantable cardioverter-defibrillator), dual, in situ   Paroxysmal a-fib (HCC)   Pressure injury of skin   Acute on chronic renal failure (HCC)   Cardiogenic shock (HCC)   Acute hypoxemic respiratory failure (HCC)   COPD mixed type (HCC)   Persistent atrial fibrillation (HCC)   LBBB (left bundle branch block)   Acute renal failure with acute tubular necrosis superimposed on stage 4 chronic kidney disease (Scott Parsons)   Uncontrolled type 2 diabetes  mellitus with complication (HCC)   Sepsis (HCC)  Acute hypoxic respiratory failure likely due to CHF exacerbation vs CAP - Resolved, he was initially on antibiotics of vancomycin and cefepime, felt less likely to have community - acquired pneumonia given rapid clearing of infiltrates after diuresis, cefepime discontinued on 1/8, completed a course of Vancomycin for staph epidermidis as below COPD- titrate O2 to maintain SPO2 89 to 93%, stable OSA - CPAP  Acute on Chronic Systolic and Diastolic CHF Exacerbation / Nonischemic cardiomyopathy with hypotension - Hypotension requiring dobutamine and levophed initially>>Resolved. Cardiology consulted and have followed patient while hospitalized.  Persistent AFib - Currently in NSR, continue Amiodarone 200 mg daily and Eliquis  HTN - Borderline hypotensive initially, blood pressure is now normal LBBB - s/p biV ICD PVD HLD Acute renal failure on CKD Stage IV(baseline Cr 1.8 to 2.0) - Cr 2.3 at [facility], improving, now 1.5 and stable Hx renal artery stenosis s/p L renal artery stent in 2007 Staph epidermidis bacteremia 2/2 bottles - completed a 7 day course with vancomycin, 2d echo without significant vegetations, d/w/ Scott Parsons from Scott Parsons. Last day was on 1/12. TEE negative for endocarditis and cultures have remained negative.  Hypothyroidism - Elevated TSH (10 at Scott Parsons), likely due to acute illness, free T4 1.4,  resume synthroid, will need repeat TFTs in 3-4 weeks as an outpatient DM type II controlled with complication - 1/6 Hemoglobin A1c=6.9  Discharge Instructions   Allergies as of 05/21/2016      Reactions   Amoxicillin Itching      Biaxin [clarithromycin] Itching, Swelling   Lip swelling  Erythromycin Itching   Penicillins Hives, Itching   Has patient had a PCN reaction causing immediate rash, facial/tongue/throat swelling, SOB or lightheadedness with hypotension: Yes Has patient had a PCN reaction causing severe rash involving  mucus membranes or skin necrosis: No Has patient had a PCN reaction that required hospitalization No Has patient had a PCN reaction occurring within the last 10 years: No If all of the above answers are "NO", then may proceed with Cephalosporin use.   Shellfish Allergy Itching       Sulfa Antibiotics Other (See Comments)   AKI and hyperkalemia      Medication List    STOP taking these medications   isosorbide mononitrate 30 MG 24 hr tablet Commonly known as:  IMDUR   metoprolol 200 MG 24 hr tablet Commonly known as:  TOPROL-XL     TAKE these medications   allopurinol 100 MG tablet Commonly known as:  ZYLOPRIM Take 100 mg by mouth daily.   amiodarone 200 MG tablet Commonly known as:  PACERONE Take 1 tablet (200 mg total) by mouth daily.   atorvastatin 20 MG tablet Commonly known as:  LIPITOR Take 20 mg by mouth daily.   Co Q 10 100 MG Caps Take 100 mg by mouth daily.   ELIQUIS 2.5 MG Tabs tablet Generic drug:  apixaban TAKE ONE TABLET BY MOUTH TWICE DAILY   FIFTY50 GLUCOSE METER 2.0 w/Device Kit   furosemide 40 MG tablet Commonly known as:  LASIX Take 1 tablet (40 mg total) by mouth daily. What changed:  how much to take   gabapentin 300 MG capsule Commonly known as:  NEURONTIN Take 300 mg by mouth at bedtime.   Magnesium 500 MG Caps Take 500 mg by mouth daily.   niacin 500 MG tablet Take 500 mg by mouth daily with breakfast.   nitroGLYCERIN 0.4 MG SL tablet Commonly known as:  NITROSTAT Place 0.4 mg under the tongue every 5 (five) minutes as needed for chest pain (maximum 3 tablets).   OXYGEN Inhale 4 L into the lungs at bedtime.   spironolactone 25 MG tablet Commonly known as:  ALDACTONE Take 0.5 tablets (12.5 mg total) by mouth daily.   zolpidem 10 MG tablet Commonly known as:  AMBIEN Take 5 mg by mouth at bedtime.            Durable Medical Equipment        Start     Ordered   05/18/16 0805  Heart failure home health orders  (Heart  failure home health orders / Face to face)  Once    Comments:  Heart Failure Follow-up Care:  Verify follow-up appointments per Patient Discharge Instructions. Confirm transportation arranged. Reconcile home medications with discharge medication list. Remove discontinued medications from use. Assist patient/caregiver to manage medications using pill box. Reinforce low sodium food selection Assessments: Vital signs and oxygen saturation at each visit. Assess home environment for safety concerns, caregiver support and availability of low-sodium foods. Consult Education officer, museum, PT/OT, Dietitian, and CNA based on assessments. Perform comprehensive cardiopulmonary assessment. Notify MD for any change in condition or weight gain of 3 pounds in one day or 5 pounds in one week with symptoms. Daily Weights and Symptom Monitoring: Ensure patient has access to scales. Teach patient/caregiver to weigh daily before breakfast and after voiding using same scale and record.    Teach patient/caregiver to track weight and symptoms and when to notify Provider. Activity: Develop individualized activity plan with patient/caregiver.   Question Answer  Comment  Heart Failure Follow-up Care [facility] ([facility] [place] Visits Set up telemonitoring equipment to monitor daily vital signs, weights and oxygen saturation   Obtain the following labs Basic Metabolic Panel   Lab frequency Other see comments   Fax lab results to AHF Clinic at [phone]   Diet Low Sodium Heart Healthy   Fluid restrictions: 2000 mL Fluid      05/18/16 0805     Follow-up Information    [facility] [place] Follow up on 05/28/2016.   Specialty:  Cardiology Why:  at 1000 for post Parsons follow up. Code for parking is 5000. Please bring all of your medications to your visit.  Contact information: [address] [id number] [place]  [facility].   Why:  They will do your home health care at your home Contact information: [place], [place], [place] [address]  [phone]          Allergies  Allergen Reactions  . Amoxicillin Itching       . Biaxin [Clarithromycin] Itching and Swelling    Lip swelling     . Erythromycin Itching  . Penicillins Hives and Itching    Has patient had a PCN reaction causing immediate rash, facial/tongue/throat swelling, SOB or lightheadedness with hypotension: Yes Has patient had a PCN reaction causing severe rash involving mucus membranes or skin necrosis: No Has patient had a PCN reaction that required hospitalization No Has patient had a PCN reaction occurring within the last 10 years: No If all of the above answers are "NO", then may proceed with Cephalosporin use.  . Shellfish Allergy Itching        . Sulfa Antibiotics Other (See Comments)    AKI and hyperkalemia    Consultations:  Cardiology   PCCM  Procedures/Studies:  2D echo  TEE  Dg Chest Port 1 View  Result Date: 05/17/2016 CLINICAL DATA:  Pneumonia. EXAM: PORTABLE CHEST 1 VIEW COMPARISON:  05/15/2016. FINDINGS: Interim extubation and removal of NG tube. Right IJ line stable position. AICD in stable position. Stable cardiomegaly. Significant clearing of bibasilar pulmonary infiltrates. COPD. Biapical pleural thickening noted consistent with scarring. No prominent pleural effusion. No pneumothorax. IMPRESSION: 1. Interim extubation removal of NG tube. 2. Significant clearing of bibasilar infiltrates. 3. AICD in stable position.  Stable heart size. Electronically Signed   By: Marcello Moores  Register   On: 05/17/2016 07:00   Dg Chest Port 1 View  Result Date: 05/15/2016 CLINICAL DATA:  Acute respiratory failure, status post line placement EXAM: PORTABLE CHEST 1 VIEW COMPARISON:  02/27/2016 FINDINGS: Pacing device is again seen and stable. Endotracheal tube,  nasogastric catheter and right jugular central line are noted in satisfactory position. No pneumothorax is noted. The lungs are well aerated bilaterally with left retrocardiac consolidation and significant lower lobe infiltrate on the right. Increased perihilar changes are noted likely related to vascular congestion and pulmonary edema. Small pleural effusions are noted bilaterally. IMPRESSION: Central vascular congestion consistent with CHF and pulmonary edema. Bilateral lower lobe Infiltrates are seen worse on the left than the right. Tubes and lines as described above.  No pneumothorax is noted. Electronically Signed   By: Inez Catalina M.D.   On: 05/15/2016 07:48     Subjective: - no chest pain, shortness of breath, no abdominal pain, nausea or vomiting.   Discharge Exam:  Vitals:   05/21/16 0403 05/21/16 1141  BP: (!) 137/59 (!) 113/42  Pulse: 77 74  Resp: 18 18  Temp: 97.5 F (36.4 C) 97.8 F (36.6 C)   Vitals:   05/20/16 1953 05/21/16 0017 05/21/16 0403 05/21/16 1141  BP: (!) 141/58 (!) 101/59 (!) 137/59 (!) 113/42  Pulse: 73 72 77 74  Resp: _0 Temp: 98.3 F (36.8 C) 98.3 F (36.8 C) 97.5 F (36.4 C) 97.8 F (36.6 C)  TempSrc: Oral Oral Oral Oral  SpO2: 99% 97% 97% 98%  Weight:   82.4 kg (181 lb 11.2 oz)   Height:       General: Pt is alert, awake, not in acute distress Cardiovascular: RRR, S1/S2 +, no rubs, no gallops Respiratory: CTA bilaterally, no wheezing, no rhonchi Abdominal: Soft, NT, ND, bowel sounds + Extremities: no edema, no cyanosis  The results of significant diagnostics from this hospitalization (including imaging, microbiology, ancillary and laboratory) are listed below for reference.     Microbiology: Recent Results (from the past 240 hour(s))  MRSA PCR Screening     Status: None   Collection Time: 05/15/16  5:39 AM  Result Value Ref Range Status   MRSA by PCR NEGATIVE NEGATIVE Final    Comment:        The GeneXpert MRSA Assay  (FDA approved for NASAL specimens only), is one component of a comprehensive MRSA colonization surveillance program. It is not intended to diagnose MRSA infection nor to guide or monitor treatment for MRSA infections.   Culture, blood (Routine X 2) w Reflex to ID Panel     Status: None   Collection Time: 05/15/16  5:47 AM  Result Value Ref Range Status   Specimen Description BLOOD RIGHT HAND  Final   Special Requests BOTTLES DRAWN AEROBIC AND ANAEROBIC  5CC  Final   Culture NO GROWTH 5 DAYS  Final   Report Status 05/20/2016 FINAL  Final  Culture, Urine     Status: None   Collection Time: 05/15/16  5:50 AM  Result Value Ref Range Status   Specimen Description URINE, CATHETERIZED  Final   Special Requests NONE  Final   Culture NO GROWTH  Final   Report Status 05/16/2016 FINAL  Final  Culture, blood (Routine X 2) w Reflex to ID Panel     Status: None   Collection Time: 05/15/16  5:59 AM  Result Value Ref Range Status   Specimen Description BLOOD LEFT HAND  Final   Special Requests BOTTLES DRAWN AEROBIC AND ANAEROBIC  3CC  Final   Culture NO GROWTH 5 DAYS  Final   Report Status 05/20/2016 FINAL  Final  Culture, blood (Routine X 2) w Reflex to ID Panel     Status: None (Preliminary result)   Collection Time: 05/17/16  6:25 PM  Result Value Ref Range Status   Specimen Description BLOOD LEFT FOREARM  Final   Special Requests BOTTLES DRAWN AEROBIC AND ANAEROBIC 5CC  Final   Culture NO GROWTH 3 DAYS  Final   Report Status PENDING  Incomplete  Culture, blood (Routine X 2) w Reflex to ID Panel     Status: None (Preliminary result)   Collection Time: 05/17/16  8:00 PM  Result Value Ref Range Status   Specimen Description BLOOD BLOOD LEFT FOREARM  Final   Special Requests BOTTLES DRAWN AEROBIC AND ANAEROBIC 5CC  Final   Culture NO GROWTH 3 DAYS  Final   Report Status PENDING  Incomplete  Labs: BNP (last 3 results)  Recent Labs  02/04/16 1541 03/09/16 1105 04/20/16 1059   BNP 1,594.5* 713.4* 4,709.6*   Basic Metabolic Panel:  Recent Labs Lab 05/16/16 0510 05/17/16 0451 05/18/16 0545 05/19/16 0420 05/20/16 0340 05/21/16 0417  NA 138 141 143 140 140 139  K 3.6 3.4* 3.4* 3.6 3.9 3.6  CL 101 103 107 110 108 107  CO2 _0 GLUCOSE 164* 93 100* 76 89 101*  BUN 50* 55* 46* 34* 32* 30*  CREATININE 2.79* 2.49* 1.90* 1.47* 1.51* 1.56*  CALCIUM 9.3 9.1 8.9 8.7* 8.5* 8.7*  MG 2.2 2.2 2.1 2.1 2.1  --   PHOS 3.3 4.2 2.8 2.6 2.8  --    Liver Function Tests:  Recent Labs Lab 05/15/16 0547 05/20/16 0340  AST 31 19  ALT 39 18  ALKPHOS 126 93  BILITOT 1.6* 1.3*  PROT 5.6* 5.0*  ALBUMIN 3.4* 2.9*   No results for input(s): LIPASE, AMYLASE in the last 168 hours. No results for input(s): AMMONIA in the last 168 hours. CBC:  Recent Labs Lab 05/15/16 0547 05/16/16 0510 05/17/16 0451 05/18/16 0545 05/19/16 0420 05/20/16 0340  WBC 10.9* 21.1* 10.7* 6.2 6.3 6.1  NEUTROABS 10.3*  --   --   --   --   --   HGB 14.3 14.7 13.9 12.7* 13.0 12.8*  HCT 42.2 43.6 42.4 38.8* 39.0 38.7*  MCV 97.9 97.1 100.0 100.0 98.5 99.5  PLT 128* 180 143* 115* 128* 122*   Cardiac Enzymes:  Recent Labs Lab 05/15/16 0547 05/15/16 1028 05/15/16 1824  TROPONINI 0.07* 0.06* 0.06*   BNP: Invalid input(s): POCBNP CBG:  Recent Labs Lab 05/20/16 1249 05/20/16 1817 05/20/16 2148 05/21/16 0610 05/21/16 1049  GLUCAP 109* 103* 188* 82 165*   D-Dimer No results for input(s): DDIMER in the last 72 hours. Hgb A1c No results for input(s): HGBA1C in the last 72 hours. Lipid Profile No results for input(s): CHOL, HDL, LDLCALC, TRIG, CHOLHDL, LDLDIRECT in the last 72 hours. Thyroid function studies No results for input(s): TSH, T4TOTAL, T3FREE, THYROIDAB in the last 72 hours.  Invalid input(s): FREET3 Anemia work up No results for input(s): VITAMINB12, FOLATE, FERRITIN, TIBC, IRON, RETICCTPCT in the last 72 hours. Urinalysis    Component Value  Date/Time   COLORURINE YELLOW 05/15/2016 0550   APPEARANCEUR CLEAR 05/15/2016 0550   LABSPEC 1.008 05/15/2016 0550   PHURINE 5.0 05/15/2016 0550   GLUCOSEU >=500 (A) 05/15/2016 0550   HGBUR LARGE (A) 05/15/2016 0550   BILIRUBINUR NEGATIVE 05/15/2016 0550   KETONESUR NEGATIVE 05/15/2016 0550   PROTEINUR NEGATIVE 05/15/2016 0550   NITRITE NEGATIVE 05/15/2016 0550   LEUKOCYTESUR NEGATIVE 05/15/2016 0550   Sepsis Labs Invalid input(s): PROCALCITONIN,  WBC,  LACTICIDVEN Microbiology Recent Results (from the past 240 hour(s))  MRSA PCR Screening     Status: None   Collection Time: 05/15/16  5:39 AM  Result Value Ref Range Status   MRSA by PCR NEGATIVE NEGATIVE Final    Comment:        The GeneXpert MRSA Assay (FDA approved for NASAL specimens only), is one component of a comprehensive MRSA colonization surveillance program. It is not intended to diagnose MRSA infection nor to guide or monitor treatment for MRSA infections.   Culture, blood (Routine X 2) w Reflex to ID Panel     Status: None   Collection Time: 05/15/16  5:47 AM  Result Value Ref Range Status   Specimen Description BLOOD  RIGHT HAND  Final   Special Requests BOTTLES DRAWN AEROBIC AND ANAEROBIC  5CC  Final   Culture NO GROWTH 5 DAYS  Final   Report Status 05/20/2016 FINAL  Final  Culture, Urine     Status: None   Collection Time: 05/15/16  5:50 AM  Result Value Ref Range Status   Specimen Description URINE, CATHETERIZED  Final   Special Requests NONE  Final   Culture NO GROWTH  Final   Report Status 05/16/2016 FINAL  Final  Culture, blood (Routine X 2) w Reflex to ID Panel     Status: None   Collection Time: 05/15/16  5:59 AM  Result Value Ref Range Status   Specimen Description BLOOD LEFT HAND  Final   Special Requests BOTTLES DRAWN AEROBIC AND ANAEROBIC  3CC  Final   Culture NO GROWTH 5 DAYS  Final   Report Status 05/20/2016 FINAL  Final  Culture, blood (Routine X 2) w Reflex to ID Panel     Status: None  (Preliminary result)   Collection Time: 05/17/16  6:25 PM  Result Value Ref Range Status   Specimen Description BLOOD LEFT FOREARM  Final   Special Requests BOTTLES DRAWN AEROBIC AND ANAEROBIC 5CC  Final   Culture NO GROWTH 3 DAYS  Final   Report Status PENDING  Incomplete  Culture, blood (Routine X 2) w Reflex to ID Panel     Status: None (Preliminary result)   Collection Time: 05/17/16  8:00 PM  Result Value Ref Range Status   Specimen Description BLOOD BLOOD LEFT FOREARM  Final   Special Requests BOTTLES DRAWN AEROBIC AND ANAEROBIC 5CC  Final   Culture NO GROWTH 3 DAYS  Final   Report Status PENDING  Incomplete    Time coordinating discharge: Over 30 minutes   SIGNED:  Marzetta Board, MD  [facility] 05/21/2016, 3:38 PM Pager [phone]  If 7PM-7AM, please contact night-coverage [email or web address] Password [email or web address]

## 2016-05-21 NOTE — Progress Notes (Signed)
Physical Therapy Treatment Patient Details Name: Scott Parsons MRN: [id number] DOB: 27-Aug-1929 Today's Date: 2016/06/09    History of Present Illness 83yo man with PMHx of chronic systolic CHF (EF 93-71% per last echo here), nonischemic cardiomyopathy, LBBB s/p biV ICD, AFib on Eliquis, COPD, and CKD stage IV who presented to Hospital with shortness of breath in setting of possible PNA/sepsis.    PT Comments    Pt continues to make good progress.   Follow Up Recommendations  Home health PT;Supervision/Assistance - 24 hour     Equipment Recommendations  None recommended by PT (Pt to borrow rolling walker)    Recommendations for Other Services       Precautions / Restrictions Precautions Precautions: Fall Restrictions Weight Bearing Restrictions: No    Mobility  Bed Mobility               General bed mobility comments: Pt sitting EOB  Transfers Overall transfer level: Needs assistance Equipment used: None Transfers: Sit to/from Stand Sit to Stand: Supervision         General transfer comment: Incr time  Ambulation/Gait Ambulation/Gait assistance: Supervision Ambulation Distance (Feet): 800 Feet Assistive device: Straight cane;Rolling walker (2 wheeled) Gait Pattern/deviations: Step-through pattern;Decreased stride length;Drifts right/left;Narrow base of support     General Gait Details: Pt demonstrating steady gait with cane for 600' and 200' with RW. Amb on RA with SpO2 >97%.   Stairs Stairs: Yes   Stair Management: One rail Right;Step to pattern;Forwards Number of Stairs: 3    Wheelchair Mobility    Modified Rankin (Stroke Patients Only)       Balance Overall balance assessment: Needs assistance Sitting-balance support: No upper extremity supported;Feet supported Sitting balance-Leahy Scale: Good     Standing balance support: No upper extremity supported Standing balance-Leahy Scale: Fair Standing balance comment: Stable with static  standing                    Cognition Arousal/Alertness: Awake/alert Behavior During Therapy: WFL for tasks assessed/performed Overall Cognitive Status: Within Functional Limits for tasks assessed                      Exercises      General Comments        Pertinent Vitals/Pain Pain Assessment: No/denies pain    Home Living                      Prior Function            PT Goals (current goals can now be found in the care plan section) Progress towards PT goals: Progressing toward goals    Frequency    Min 3X/week      PT Plan Current plan remains appropriate    Co-evaluation             End of Session Equipment Utilized During Treatment: Gait belt Activity Tolerance: Patient tolerated treatment well Patient left: in chair;with call bell/phone within reach;with chair alarm set ([place] tech present for echo)     Time: 6967-8938 PT Time Calculation (min) (ACUTE ONLY): 23 min  Charges:  $Gait Training: 23-37 mins                    G CodesAngelina Ok Maycok 2016-06-09, 8:59 AM [employer] PT [phone]

## 2016-05-21 NOTE — Progress Notes (Signed)
Patient is discharging home today with HHC services provided by [facility] in [place]; CM talked to [place] at [facility]; they will send a nurse out to see him tomorrow; Alexis Goodell [phone]

## 2016-05-21 NOTE — Progress Notes (Signed)
Order entered for IV team to remove central line, for pt discharge.

## 2016-05-22 LAB — CULTURE, BLOOD (ROUTINE X 2)
CULTURE: NO GROWTH
Culture: NO GROWTH

## 2016-05-24 ENCOUNTER — Ambulatory Visit (INDEPENDENT_AMBULATORY_CARE_PROVIDER_SITE_OTHER): Payer: Medicare Other

## 2016-05-24 DIAGNOSIS — Z95810 Presence of automatic (implantable) cardiac defibrillator: Secondary | ICD-10-CM

## 2016-05-24 DIAGNOSIS — I509 Heart failure, unspecified: Secondary | ICD-10-CM

## 2016-05-24 NOTE — Progress Notes (Signed)
EPIC Encounter for ICM Monitoring  Patient Name: Scott Parsons is a 83 y.o. male Date: 05/24/2016 Primary Care Physican: [name], MD Primary Cardiologist:Berry/Bensimhon Electrophysiologist: Allred Dry Weight:    181.8 lb  Bi-V Pacing:  99.3%          Heart Failure questions reviewed, pt asymptomatic.  Hospitalized for pneumonia and discharged from hospital on 05/21/2016.    Thoracic impedance abnormal suggesting fluid accumulation since 05/01/2016 and worsened on 05/15/2016.  Recommendations: Copy of ICM check sent to Dr Gala Romney, Maxine Glenn, PA, and Dr Johney Frame for review and if any recommendations will call back.  Encouraged to call for fluid symptoms.  Follow-up plan: ICM clinic phone appointment on 06/08/2016.   Already scheduled for HF office visit on 05/28/2016 for post hospital follow up.     3 month ICM trend: 05/24/2016   1 Year ICM trend:      Karie Soda, RN 05/24/2016 8:28 AM

## 2016-05-25 NOTE — Progress Notes (Signed)
Per Dr Gala Romney pt should increase Lasix to 40 mg BID for 2 days, keep appt as sch on Fri 1/19.  Spoke w/pt, he is aware and agreeable.

## 2016-05-28 ENCOUNTER — Inpatient Hospital Stay (HOSPITAL_COMMUNITY): Admit: 2016-05-28 | Payer: Medicare Other

## 2016-05-28 NOTE — Progress Notes (Signed)
Call to patient and left message to send remote ICM transmission on 05/31/2016 to recheck fluid levels.

## 2016-05-31 ENCOUNTER — Ambulatory Visit (INDEPENDENT_AMBULATORY_CARE_PROVIDER_SITE_OTHER): Payer: Medicare Other

## 2016-05-31 DIAGNOSIS — I509 Heart failure, unspecified: Secondary | ICD-10-CM

## 2016-05-31 DIAGNOSIS — Z95810 Presence of automatic (implantable) cardiac defibrillator: Secondary | ICD-10-CM

## 2016-05-31 NOTE — Progress Notes (Signed)
EPIC Encounter for ICM Monitoring  Patient Name: Scott Parsons is a 83 y.o. male Date: 05/31/2016 Primary Care Physican: [name], MD Primary Cardiologist:Berry/Bensimhon Electrophysiologist: Allred Dry Weight:172.5 lb  Bi-V Pacing: 99.2%      Heart Failure questions reviewed, pt asymptomatic.  Lost 9 lbs since last ICM transmission 05/24/2016.  Thoracic impedance returned close to normal after increased Furosemide x 2 days on 1/15 as recommended by Dr Gala Romney.  Recommendations: No changes. Discussed limiting dietary salt intake to 2000 mg/day and fluid intake to < 2 liters per day. Encouraged to call for fluid symptoms.  Follow-up plan: ICM clinic phone appointment on 06/15/2016.   He stated he is waiting to be rescheduled for his HF clinic follow up appointment since it was canceled last week due to snow.  He said they told him they would call back.  Advised would send a message to get him rescheduled and if he has not heard from the clinic in a couple of days to give their office a call.    Copy of ICM check sent to primary cardiologist and device physician.   3 month ICM trend: 05/31/2016   1 Year ICM trend:      Karie Soda, RN 05/31/2016 3:05 PM

## 2016-06-10 ENCOUNTER — Telehealth (HOSPITAL_COMMUNITY): Payer: Self-pay | Admitting: Cardiology

## 2016-06-10 ENCOUNTER — Ambulatory Visit (HOSPITAL_COMMUNITY)
Admission: RE | Admit: 2016-06-10 | Discharge: 2016-06-10 | Disposition: A | Discharge status: Home / Self Care | Payer: Medicare Other | Source: Ambulatory Visit | Attending: Internal Medicine | Admitting: Internal Medicine

## 2016-06-10 ENCOUNTER — Encounter (HOSPITAL_COMMUNITY): Payer: Self-pay

## 2016-06-10 VITALS — BP 130/56 | HR 79 | Wt 180.0 lb

## 2016-06-10 DIAGNOSIS — I480 Paroxysmal atrial fibrillation: Secondary | ICD-10-CM

## 2016-06-10 DIAGNOSIS — I2510 Atherosclerotic heart disease of native coronary artery without angina pectoris: Secondary | ICD-10-CM | POA: Insufficient documentation

## 2016-06-10 DIAGNOSIS — Z7901 Long term (current) use of anticoagulants: Secondary | ICD-10-CM | POA: Insufficient documentation

## 2016-06-10 DIAGNOSIS — Z8673 Personal history of transient ischemic attack (TIA), and cerebral infarction without residual deficits: Secondary | ICD-10-CM | POA: Insufficient documentation

## 2016-06-10 DIAGNOSIS — I739 Peripheral vascular disease, unspecified: Secondary | ICD-10-CM | POA: Diagnosis not present

## 2016-06-10 DIAGNOSIS — I5022 Chronic systolic (congestive) heart failure: Secondary | ICD-10-CM | POA: Diagnosis present

## 2016-06-10 DIAGNOSIS — Z91013 Allergy to seafood: Secondary | ICD-10-CM | POA: Insufficient documentation

## 2016-06-10 DIAGNOSIS — E785 Hyperlipidemia, unspecified: Secondary | ICD-10-CM | POA: Diagnosis not present

## 2016-06-10 DIAGNOSIS — Z87891 Personal history of nicotine dependence: Secondary | ICD-10-CM | POA: Insufficient documentation

## 2016-06-10 DIAGNOSIS — R7303 Prediabetes: Secondary | ICD-10-CM | POA: Diagnosis not present

## 2016-06-10 DIAGNOSIS — Z79899 Other long term (current) drug therapy: Secondary | ICD-10-CM | POA: Insufficient documentation

## 2016-06-10 DIAGNOSIS — R0602 Shortness of breath: Secondary | ICD-10-CM | POA: Diagnosis not present

## 2016-06-10 DIAGNOSIS — Z7902 Long term (current) use of antithrombotics/antiplatelets: Secondary | ICD-10-CM | POA: Insufficient documentation

## 2016-06-10 DIAGNOSIS — I4891 Unspecified atrial fibrillation: Secondary | ICD-10-CM | POA: Insufficient documentation

## 2016-06-10 DIAGNOSIS — I5042 Chronic combined systolic (congestive) and diastolic (congestive) heart failure: Secondary | ICD-10-CM | POA: Diagnosis not present

## 2016-06-10 DIAGNOSIS — G4733 Obstructive sleep apnea (adult) (pediatric): Secondary | ICD-10-CM | POA: Insufficient documentation

## 2016-06-10 DIAGNOSIS — I447 Left bundle-branch block, unspecified: Secondary | ICD-10-CM | POA: Diagnosis not present

## 2016-06-10 DIAGNOSIS — Z880 Allergy status to penicillin: Secondary | ICD-10-CM | POA: Diagnosis not present

## 2016-06-10 DIAGNOSIS — Z8249 Family history of ischemic heart disease and other diseases of the circulatory system: Secondary | ICD-10-CM | POA: Insufficient documentation

## 2016-06-10 DIAGNOSIS — I429 Cardiomyopathy, unspecified: Secondary | ICD-10-CM | POA: Insufficient documentation

## 2016-06-10 DIAGNOSIS — N184 Chronic kidney disease, stage 4 (severe): Secondary | ICD-10-CM | POA: Diagnosis not present

## 2016-06-10 DIAGNOSIS — I509 Heart failure, unspecified: Secondary | ICD-10-CM

## 2016-06-10 DIAGNOSIS — Z888 Allergy status to other drugs, medicaments and biological substances status: Secondary | ICD-10-CM | POA: Insufficient documentation

## 2016-06-10 DIAGNOSIS — I6522 Occlusion and stenosis of left carotid artery: Secondary | ICD-10-CM | POA: Insufficient documentation

## 2016-06-10 DIAGNOSIS — I6523 Occlusion and stenosis of bilateral carotid arteries: Secondary | ICD-10-CM | POA: Insufficient documentation

## 2016-06-10 DIAGNOSIS — Z95810 Presence of automatic (implantable) cardiac defibrillator: Secondary | ICD-10-CM | POA: Insufficient documentation

## 2016-06-10 DIAGNOSIS — I472 Ventricular tachycardia: Secondary | ICD-10-CM | POA: Diagnosis not present

## 2016-06-10 LAB — BASIC METABOLIC PANEL
Anion gap: 9 (ref 5–15)
BUN: 35 mg/dL — AB (ref 6–20)
CALCIUM: 9.6 mg/dL (ref 8.9–10.3)
CO2: 27 mmol/L (ref 22–32)
CREATININE: 2.37 mg/dL — AB (ref 0.61–1.24)
Chloride: 99 mmol/L — ABNORMAL LOW (ref 101–111)
GFR calc non Af Amer: 24 mL/min — ABNORMAL LOW (ref >60)
GFR, EST AFRICAN AMERICAN: 27 mL/min — AB (ref >60)
GLUCOSE: 149 mg/dL — AB (ref 65–99)
Potassium: 4.2 mmol/L (ref 3.5–5.1)
Sodium: 135 mmol/L (ref 135–145)

## 2016-06-10 NOTE — Progress Notes (Signed)
Patient ID: Scott Parsons, male   DOB: [birth year], 83 y.o.   MRN: [id number]    Advanced Heart Failure Clinic Note    Primary Care: [name], MD Primary Cardiologist: Dr Gwenlyn Found Primary HF: Dr. [name]   HPI: Scott Parsons is a 83 y.o. male with history of NICM, Chronic systolic CHF (Echo 11/8936 EF 20-25%), LBBB s/p biV ICD, CKD III-IV, HTN, PAD, Carotid stenosis, and RAS s/p renal stenting.   He is followed closely in the [facility] CHF clinic by Carolynn Serve. In 09/2015 was found to have worsening of renal fxn along with hyperkalemia. He was admitted to [facility] and his lisinopril, valsartan, and spironolactone were discontinued. Labs were stable upon f/u and he was subsequently placed on entresto.  Was taken to [facility] ED on 11/11/15 with acute worsening dyspnea. Required Bipap. Tx to [facility] for further eval. Was admitted by CCM for resp failure. Had significant symptom improvement with diuresis. Catheterization as below with mild non-obstructive CAD and well compensated hemodynamics after diuresis. Diuresed 2.2 L. Weight 190 on discharge.   Admitted 8/26-8/30 with flash edema. Diuresed with IV lasix. ICD adjusted to increase pacing. Out 10 lbs with IV lasix. Discharge weight was 180 lbs.   Admitted 1/6 through 1/12 with respiratory failure and volume overload. Required short term intubation. Treated for pneumonia. Placed dobutamine and levophed. Discharged on lasix 40 mg daily. Discharge weight 181 pounds.   Today he returns for post hospital follow up. Overall feeling good. Complaining of fatigue. SOB with steps but otherwise denies SOB. Sleeps on a wedge. Denies PND. Weight at home 171-175 pounds. No fever or chills. Appetite ok. Taking all medications. Lives with his wife.   Optivol - well below threshold, activity ~1 hour per day. No VT  Labs  05/21/2016: K 3.6 Creatinine 1.56     R/LHC 11/13/15  Mild non-obstructive CAD, and well compensated hemodynamics after diuresis.  RA = 3 RV =  36/3 PA = 34/12 (20) PCW = 12 Ao = 118/48 (72) LV = 129/18 Fick cardiac output/index = 5.6/2.6 PVR = 1.4 WU SVR = 984 FA sat = 87% PA sat = 58%, 55%   TEE 05/20/16 LVEF 15%, no evidence of endocarditis.   Echo 11/13/15  LVEF 20-25% with diffuse hypokinesis. Mild AS, Moderate MR, Severe LAE   Past Medical History:  Diagnosis Date  . Carotid artery disease ([place])    a. 04/2015 mild to mod bilat ICA stenosis;  b. 05/173 >10% LICA stenosis, no significant dzs on right.  . Chronic combined systolic and diastolic CHF, NYHA class 4 ([place])    a. 01/2013 Echo: EF 25-30%; b. 08/2013 Echo: EF 15-20%; c. 04/2014 Echo: EF 20-25%, mild LVH, Gr1 DD, mild MR, mod dil LA.  . CKD (chronic kidney disease), stage IV ([place])    a. baseline creat of ~ 1.8;  b. 09/2015 Admitted to [facility] with AKI and hyperkalemia.  [name] History of stroke   . Hyperlipidemia   . Hypertensive heart disease with CHF (congestive heart failure) ([place])   . ICD (implantable cardioverter-defibrillator), dual, in situ    a. 09/2013 s/p MDT Hillery Aldo XT CRT-D (ser #[id number]).  . LBBB (left bundle branch block)   . Non-obstructive CAD    a. 11/2001 Cath [facility]): LM nl, LAD 18m LCX nondom, nl, RI large, nl, RCA dominant, 470m.  . Marland Kitchenonischemic cardiomyopathy ([place]   a. 11/2001 nonobs cath; b. 09/2013 s/p MDT ViHillery AldoT CRT-D (ser #B#[id number]); c. 04/2014 Echo:  EF 20-25%;  d. Followed @ [facility] by K. Andree Elk, MD.  . OSA (obstructive sleep apnea)    a. Does not tolerate/use CPAP.  . Pre-diabetes   . PVD (peripheral vascular disease) ([place])    a. 10/2005 PTA: right external iliac artery angioplasty and left renal artery angioplasty and stenting;  b. 10/2015 ABI's in setting of recurrent claudication: R 0.97, L 0.77 (reduced by 0.12 from prior study).  . Renal artery stenosis ([place])    a. 10/2005 s/p L renal artery stenting.  [name] Respiratory failure requiring intubation ([place]) 05/15/2016   by [facility], extubated after arrival to [place]    . TIA (transient ischemic attack) 06/2010    Current Outpatient Prescriptions  Medication Sig Dispense Refill  . allopurinol (ZYLOPRIM) 100 MG tablet Take 100 mg by mouth daily.    [name] amiodarone (PACERONE) 200 MG tablet Take 1 tablet (200 mg total) by mouth daily. 90 tablet 3  . atorvastatin (LIPITOR) 20 MG tablet Take 20 mg by mouth daily.    . Blood Glucose Monitoring Suppl (FIFTY50 GLUCOSE METER 2.0) w/Device KIT     . ELIQUIS 2.5 MG TABS tablet TAKE ONE TABLET BY MOUTH TWICE DAILY 60 tablet 3  . furosemide (LASIX) 40 MG tablet Take 1 tablet (40 mg total) by mouth daily. 30 tablet 6  . gabapentin (NEURONTIN) 300 MG capsule Take 300 mg by mouth at bedtime.    . Magnesium 500 MG CAPS Take 500 mg by mouth daily.    . niacin 500 MG tablet Take 500 mg by mouth daily with breakfast.    . OXYGEN Inhale 4 L into the lungs at bedtime.     [name] spironolactone (ALDACTONE) 25 MG tablet Take 0.5 tablets (12.5 mg total) by mouth daily. 15 tablet 6  . zolpidem (AMBIEN) 10 MG tablet Take 5 mg by mouth at bedtime.     . Coenzyme Q10 (CO Q 10) 100 MG CAPS Take 100 mg by mouth daily.    . nitroGLYCERIN (NITROSTAT) 0.4 MG SL tablet Place 0.4 mg under the tongue every 5 (five) minutes as needed for chest pain (maximum 3 tablets).     No current facility-administered medications for this encounter.     Allergies  Allergen Reactions  . Amoxicillin Itching       . Biaxin [Clarithromycin] Itching and Swelling    Lip swelling     . Erythromycin Itching  . Penicillins Hives and Itching    Has patient had a PCN reaction causing immediate rash, facial/tongue/throat swelling, SOB or lightheadedness with hypotension: Yes Has patient had a PCN reaction causing severe rash involving mucus membranes or skin necrosis: No Has patient had a PCN reaction that required hospitalization No Has patient had a PCN reaction occurring within the last 10 years: No If all of the above answers are "NO", then may proceed with  Cephalosporin use.  . Shellfish Allergy Itching        . Sulfa Antibiotics Other (See Comments)    AKI and hyperkalemia      Social History   Social History  . Marital status: Married    Spouse name: N/A  . Number of children: N/A  . Years of education: N/A   Occupational History  . Retired    Social History Main Topics  . Smoking status: Former Smoker    Years: 52.00    Types: Cigarettes    Quit date: 08/11/2004  . Smokeless tobacco: Never Used  . Alcohol use No  .  Drug use: No  . Sexual activity: Yes    Partners: Female   Other Topics Concern  . Not on file   Social History Narrative   Retired.  Lives in [place] with his wife.  Tries to remain active.      Family History  Problem Relation Age of Onset  . Alzheimer's disease Mother   . Heart disease Father   . Heart disease Paternal Grandfather   . Heart attack Paternal Grandfather     Vitals:   06/10/16 0900  BP: (!) 130/56  Pulse: 79  SpO2: 97%  Weight: 180 lb (81.6 kg)    Wt Readings from Last 3 Encounters:  06/10/16 180 lb (81.6 kg)  05/21/16 181 lb 11.2 oz (82.4 kg)  04/22/16 183 lb (83 kg)     PHYSICAL EXAM:  General: Elderly. NAD.   HEENT: Normal  Neck: supple. JVP 5-6 with mild HJR. Carotids 2+ bilat; no bruits. No thyromegaly or nodule noted.  Cor: PMI nondisplaced. RRR, 2/6 MR, TR Lungs: CTAB on room air.   Abdomen: soft, NT, ND, no HSM. No bruits or masses. +BS  Extremities: no cyanosis, clubbing, rash. No lower extremity edema. Warm.  Neuro: A&Ox3. cranial nerves grossly intact. moves all 4 extremities w/o difficulty. Affect pleasant  ASSESSMENT & PLAN:  1. Chronic systolic HF:  TEE 0/12/22 LVEF 15%, no evidence of endocarditis.  Echo 11/13/15 EF 20-25%.  Nonischemic cardiomyopathy.  Medtronic CRT-D.   NYHA II-III  - Volume status stable. Continue lasix 40 mg daily + spiro 12.5 mg daily -Keep off bb with fatigue.  - No Ace with CKD.   - Reinforced fluid restriction to < 2 L  daily, sodium restriction to less than 2000 mg daily, and the importance of daily weights.   2. CKD III -IV BMET today. May need to adjust diuretics again.  3. NSVT and frequent PVCs: Off BB with recent low output. Consider at next visit.   4. Carotid stenosis > 70% on L  5. CAD - Mild non obstructive on cath 11/2015 - Now off ASA and plavix with Eliquis.  - Continue atorvastatin 20 mg daily.  6. Paroxysmal, A fib - New onset 9/23 deivice interrogation confims.   - s/p TEE/DCCV 02/25/16.  - No further afib since DCCV as of 04/20/16.  - No bleeding problems. Continue Eliquis 2.5 mg BID. He is off ASA and plavix.  - Continue amio 200 mg daily.  - This patients CHA2DS2-VASc Score and unadjusted Ischemic Stroke Rate (% per year) is equal to 11.2 % stroke rate/year from a score of 7   Follow up in 3 weeks with Dr [name]. Ideally would benefit from Palliative Care but he would like to hold off.   Scott Grinder, NP-C   06/10/2016

## 2016-06-10 NOTE — Telephone Encounter (Signed)
Patient aware. Patient voiced understanding Repeat labs 2/8

## 2016-06-10 NOTE — Telephone Encounter (Signed)
-----   Message from Sherald Hess, NP sent at 06/10/2016  1:45 PM EST ----- Please call ask him to hold lasix for 2 days. Stop spiro. Repeat BMET next week.

## 2016-06-10 NOTE — Patient Instructions (Signed)
Labs today We will only contact you if something comes back abnormal or we need to make some changes. Otherwise no news is good news!   Your physician recommends that you schedule a follow-up appointment in: 3 weeks with Dr Gala Romney  Do the following things EVERYDAY: 1) Weigh yourself in the morning before breakfast. Write it down and keep it in a log. 2) Take your medicines as prescribed 3) Eat low salt foods-Limit salt (sodium) to 2000 mg per day.  4) Stay as active as you can everyday 5) Limit all fluids for the day to less than 2 liters

## 2016-06-15 ENCOUNTER — Ambulatory Visit (INDEPENDENT_AMBULATORY_CARE_PROVIDER_SITE_OTHER): Payer: Medicare Other

## 2016-06-15 DIAGNOSIS — Z95810 Presence of automatic (implantable) cardiac defibrillator: Secondary | ICD-10-CM

## 2016-06-15 DIAGNOSIS — I509 Heart failure, unspecified: Secondary | ICD-10-CM

## 2016-06-15 NOTE — Progress Notes (Signed)
EPIC Encounter for ICM Monitoring  Patient Name: Scott Parsons is a 83 y.o. male Date: 06/15/2016 Primary Care Physican: [name], MD Primary Cardiologist:Berry/Bensimhon Electrophysiologist: Allred Dry Weight: 175.2 lbs  Bi-V Pacing:  99.6%       Heart Failure questions reviewed, pt asymptomatic   Thoracic impedance normal.  Lasix was on hold x 2 days following lab results on 2/1 and he is back on his prescribed dosage now.   Labs: Lab scheduled for 06/17/2016 06/10/2016 Creatinine 2.37, BUN 35, Potassium 4.2, Sodium 135, EGFR 24-27 05/21/2016 Creatinine 1.56, BUN 30, Potassium 3.6, Sodium 139, EGFR 39-45  05/20/2016 Creatinine 1.51, BUN 32, Potassium 3.9, Sodium 140, EGFR 41-47  05/19/2016 Creatinine 1.47, BUN 34, Potassium 3.6, Sodium 140, EGFR 42-49  05/18/2016 Creatinine 1.90, BUN 46, Potassium 3.4, Sodium 143, EGFR 31-36  05/17/2016 Creatinine 2.49, BUN 55, Potassium 3.4, Sodium 141, EGFR 22-26  05/16/2016 Creatinine 2.79, BUN 50, Potassium 3.6, Sodium 138, EGFR 19-22  05/15/2016 Creatinine 2.86, BUN 44, Potassium 4.8, Sodium 134, EGFR 19-22  04/28/2016 Creatinine 2.46, BUN 45, Potassium 4.1, Sodium 139, EGFR 23-26  04/20/2016 Creatinine 2.31, BUN 43, Potassium 4.0, Sodium 138, EGFR 25-28   Recommendations: No changes. Reminded to limit dietary salt intake to 2000 mg/day and fluid intake to < 2 liters/day. Encouraged to call for fluid symptoms.  Follow-up plan: ICM clinic phone appointment on 07/22/2016. HF clinic appointment on 07/05/2016  Copy of ICM check sent to primary cardiologist and device physician.   3 month ICM trend: 06/15/2016   1 Year ICM trend:      Rosalene Billings, RN 06/15/2016 5:08 PM

## 2016-06-17 ENCOUNTER — Ambulatory Visit (HOSPITAL_COMMUNITY)
Admission: RE | Admit: 2016-06-17 | Discharge: 2016-06-17 | Disposition: A | Discharge status: Home / Self Care | Payer: Medicare Other | Source: Ambulatory Visit | Attending: Cardiology | Admitting: Cardiology

## 2016-06-17 ENCOUNTER — Other Ambulatory Visit: Payer: Self-pay | Admitting: Internal Medicine

## 2016-06-17 DIAGNOSIS — I509 Heart failure, unspecified: Secondary | ICD-10-CM | POA: Insufficient documentation

## 2016-06-17 LAB — BASIC METABOLIC PANEL
Anion gap: 8 (ref 5–15)
BUN: 32 mg/dL — AB (ref 6–20)
CALCIUM: 9.3 mg/dL (ref 8.9–10.3)
CO2: 29 mmol/L (ref 22–32)
CREATININE: 2.21 mg/dL — AB (ref 0.61–1.24)
Chloride: 100 mmol/L — ABNORMAL LOW (ref 101–111)
GFR calc Af Amer: 30 mL/min — ABNORMAL LOW (ref >60)
GFR, EST NON AFRICAN AMERICAN: 26 mL/min — AB (ref >60)
Glucose, Bld: 173 mg/dL — ABNORMAL HIGH (ref 65–99)
POTASSIUM: 4 mmol/L (ref 3.5–5.1)
SODIUM: 137 mmol/L (ref 135–145)

## 2016-06-25 ENCOUNTER — Encounter (HOSPITAL_COMMUNITY): Payer: Medicare Other | Admitting: Internal Medicine

## 2016-07-05 ENCOUNTER — Encounter (HOSPITAL_COMMUNITY): Payer: Self-pay | Admitting: Internal Medicine

## 2016-07-05 ENCOUNTER — Ambulatory Visit (HOSPITAL_COMMUNITY)
Admission: RE | Admit: 2016-07-05 | Discharge: 2016-07-05 | Disposition: A | Discharge status: Home / Self Care | Payer: Medicare Other | Source: Ambulatory Visit | Attending: Internal Medicine | Admitting: Internal Medicine

## 2016-07-05 VITALS — BP 104/50 | HR 83 | Wt 180.8 lb

## 2016-07-05 DIAGNOSIS — R7303 Prediabetes: Secondary | ICD-10-CM | POA: Diagnosis not present

## 2016-07-05 DIAGNOSIS — Z7901 Long term (current) use of anticoagulants: Secondary | ICD-10-CM | POA: Diagnosis not present

## 2016-07-05 DIAGNOSIS — I5042 Chronic combined systolic (congestive) and diastolic (congestive) heart failure: Secondary | ICD-10-CM | POA: Insufficient documentation

## 2016-07-05 DIAGNOSIS — Z7902 Long term (current) use of antithrombotics/antiplatelets: Secondary | ICD-10-CM | POA: Diagnosis not present

## 2016-07-05 DIAGNOSIS — Z91013 Allergy to seafood: Secondary | ICD-10-CM | POA: Diagnosis not present

## 2016-07-05 DIAGNOSIS — Z820 Family history of epilepsy and other diseases of the nervous system: Secondary | ICD-10-CM | POA: Insufficient documentation

## 2016-07-05 DIAGNOSIS — I472 Ventricular tachycardia: Secondary | ICD-10-CM | POA: Insufficient documentation

## 2016-07-05 DIAGNOSIS — I429 Cardiomyopathy, unspecified: Secondary | ICD-10-CM | POA: Diagnosis not present

## 2016-07-05 DIAGNOSIS — Z95810 Presence of automatic (implantable) cardiac defibrillator: Secondary | ICD-10-CM | POA: Diagnosis not present

## 2016-07-05 DIAGNOSIS — I447 Left bundle-branch block, unspecified: Secondary | ICD-10-CM | POA: Insufficient documentation

## 2016-07-05 DIAGNOSIS — I2510 Atherosclerotic heart disease of native coronary artery without angina pectoris: Secondary | ICD-10-CM | POA: Diagnosis not present

## 2016-07-05 DIAGNOSIS — I739 Peripheral vascular disease, unspecified: Secondary | ICD-10-CM | POA: Insufficient documentation

## 2016-07-05 DIAGNOSIS — I6523 Occlusion and stenosis of bilateral carotid arteries: Secondary | ICD-10-CM | POA: Insufficient documentation

## 2016-07-05 DIAGNOSIS — I480 Paroxysmal atrial fibrillation: Secondary | ICD-10-CM

## 2016-07-05 DIAGNOSIS — I4891 Unspecified atrial fibrillation: Secondary | ICD-10-CM | POA: Insufficient documentation

## 2016-07-05 DIAGNOSIS — Z79899 Other long term (current) drug therapy: Secondary | ICD-10-CM | POA: Diagnosis not present

## 2016-07-05 DIAGNOSIS — N184 Chronic kidney disease, stage 4 (severe): Secondary | ICD-10-CM | POA: Diagnosis not present

## 2016-07-05 DIAGNOSIS — R0602 Shortness of breath: Secondary | ICD-10-CM | POA: Insufficient documentation

## 2016-07-05 DIAGNOSIS — E785 Hyperlipidemia, unspecified: Secondary | ICD-10-CM | POA: Insufficient documentation

## 2016-07-05 DIAGNOSIS — Z880 Allergy status to penicillin: Secondary | ICD-10-CM | POA: Insufficient documentation

## 2016-07-05 DIAGNOSIS — Z8249 Family history of ischemic heart disease and other diseases of the circulatory system: Secondary | ICD-10-CM | POA: Diagnosis not present

## 2016-07-05 DIAGNOSIS — N179 Acute kidney failure, unspecified: Secondary | ICD-10-CM | POA: Diagnosis not present

## 2016-07-05 DIAGNOSIS — G4733 Obstructive sleep apnea (adult) (pediatric): Secondary | ICD-10-CM | POA: Insufficient documentation

## 2016-07-05 DIAGNOSIS — I5022 Chronic systolic (congestive) heart failure: Secondary | ICD-10-CM | POA: Diagnosis not present

## 2016-07-05 DIAGNOSIS — Z87891 Personal history of nicotine dependence: Secondary | ICD-10-CM | POA: Insufficient documentation

## 2016-07-05 DIAGNOSIS — Z888 Allergy status to other drugs, medicaments and biological substances status: Secondary | ICD-10-CM | POA: Diagnosis not present

## 2016-07-05 DIAGNOSIS — Z8673 Personal history of transient ischemic attack (TIA), and cerebral infarction without residual deficits: Secondary | ICD-10-CM | POA: Diagnosis not present

## 2016-07-05 LAB — BASIC METABOLIC PANEL
ANION GAP: 13 (ref 5–15)
BUN: 29 mg/dL — ABNORMAL HIGH (ref 6–20)
CHLORIDE: 99 mmol/L — AB (ref 101–111)
CO2: 27 mmol/L (ref 22–32)
CREATININE: 2.3 mg/dL — AB (ref 0.61–1.24)
Calcium: 9.1 mg/dL (ref 8.9–10.3)
GFR calc non Af Amer: 24 mL/min — ABNORMAL LOW (ref >60)
GFR, EST AFRICAN AMERICAN: 28 mL/min — AB (ref >60)
Glucose, Bld: 113 mg/dL — ABNORMAL HIGH (ref 65–99)
Potassium: 3.8 mmol/L (ref 3.5–5.1)
Sodium: 139 mmol/L (ref 135–145)

## 2016-07-05 LAB — BRAIN NATRIURETIC PEPTIDE: B Natriuretic Peptide: 887.4 pg/mL — ABNORMAL HIGH (ref 0.0–100.0)

## 2016-07-05 MED ORDER — FUROSEMIDE 40 MG PO TABS: ORAL_TABLET | ORAL | 6 refills | Status: DC

## 2016-07-05 NOTE — Addendum Note (Signed)
Encounter addended by: Noralee Space, RN on: 07/05/2016  4:00 PM<BR>    Actions taken: Visit diagnoses modified, Diagnosis association updated, Order list changed

## 2016-07-05 NOTE — Addendum Note (Signed)
Encounter addended by: Dolores Patty, MD on: 07/05/2016  3:48 PM<BR>    Actions taken: Charge Capture section accepted, Sign clinical note

## 2016-07-05 NOTE — Progress Notes (Addendum)
Patient ID: Scott Parsons, male   DOB: [birth year], 83 y.o.   MRN: [id number]    Advanced Heart Failure Clinic Note    Primary Care: [name], MD Primary Cardiologist: Dr Gwenlyn Found Primary HF: Dr. [name]   HPI: Scott Parsons is a 83 y.o. male with history of NICM, Chronic systolic CHF (Echo 05/173 EF 20-25%), LBBB s/p biV ICD, CKD III-IV, HTN, PAD, Carotid stenosis, and RAS s/p renal stenting.   He was previously followed closely by Carolynn Serve at [facility]. In 09/2015 was found to have worsening of renal fxn along with hyperkalemia. He was admitted to [facility] and his lisinopril, valsartan, and spironolactone were discontinued. Labs were stable upon f/u and he was subsequently placed on entresto.  Was taken to [facility] ED on 11/11/15 with acute worsening dyspnea. Required Bipap. Tx to [facility] for further eval. Was admitted by CCM for resp failure. Had significant symptom improvement with diuresis. Catheterization as below with mild non-obstructive CAD and well compensated hemodynamics after diuresis. Diuresed 2.2 L. Weight 190 on discharge.   Admitted 8/26-8/30 with flash edema. Diuresed with IV lasix. ICD adjusted to increase pacing. Out 10 lbs with IV lasix. Discharge weight was 180 lbs.   Admitted 1/6 through 05/21/16 with respiratory failure and volume overload. Required short term intubation. Treated for pneumonia. Placed on dobutamine and levophed. Discharged on lasix 40 mg daily. Discharge weight 181 pounds.   Today he returns for routine follow up. Seen a couple of weeks ago and was feeling pretty good. However creatinine rose from 1.56 to 2.37. Arlyce Harman stopped and lasix held for several days. Now taking lasix 40 daily and will take extra when his weight goes up. Breathing usually pretty good but 2 weeks ago has episode where he got really SOB when he was at his grandaughter's house where it was cold. Episode resolved after he warmed up in car. No CP.  Fatigues easily. No orthopnea or PND.  Weight at home stable at 175 pounds (previously ranged 171-175 pounds). Taking all medications.  Optivol - trending up, activity ~1-2 hour per day (increasing). No VT or AF. LV pacing 100%  Labs  05/21/2016: K 3.6 Creatinine 1.56     R/LHC 11/13/15  Mild non-obstructive CAD, and well compensated hemodynamics after diuresis.  RA = 3 RV = 36/3 PA = 34/12 (20) PCW = 12 Ao = 118/48 (72) LV = 129/18 Fick cardiac output/index = 5.6/2.6 PVR = 1.4 WU SVR = 984 FA sat = 87% PA sat = 58%, 55%   TEE 05/20/16 LVEF 15%, no evidence of endocarditis.   Echo 11/13/15  LVEF 20-25% with diffuse hypokinesis. Mild AS, Moderate MR, Severe LAE   Past Medical History:  Diagnosis Date  . Carotid artery disease ([place])    a. 04/2015 mild to mod bilat ICA stenosis;  b. 05/256 >52% LICA stenosis, no significant dzs on right.  . Chronic combined systolic and diastolic CHF, NYHA class 4 ([place])    a. 01/2013 Echo: EF 25-30%; b. 08/2013 Echo: EF 15-20%; c. 04/2014 Echo: EF 20-25%, mild LVH, Gr1 DD, mild MR, mod dil LA.  . CKD (chronic kidney disease), stage IV ([place])    a. baseline creat of ~ 1.8;  b. [facility].  [name] History of stroke   . Hyperlipidemia   . Hypertensive heart disease with CHF (congestive heart failure) ([place])   . ICD (implantable cardioverter-defibrillator), dual, in situ    a. 09/2013 s/p MDT Hillery Aldo  XT CRT-D (ser #[id number]).  . LBBB (left bundle branch block)   . Non-obstructive CAD    a. 11/2001 Cath [facility]): LM nl, LAD 50m LCX nondom, nl, RI large, nl, RCA dominant, 452m.  . Marland Kitchenonischemic cardiomyopathy ([place]   a. 11/2001 nonobs cath; b. 09/2013 s/p MDT ViHillery AldoT CRT-D (ser #B#[id number]); c. 04/2014 Echo: EF 20-25%;  d. Followed @ [facility] by K. AdAndree ElkMD.  . OSA (obstructive sleep apnea)    a. Does not tolerate/use CPAP.  . Pre-diabetes   . PVD (peripheral vascular disease) ([place]   a. 10/2005 PTA: right external iliac artery angioplasty and left  renal artery angioplasty and stenting;  b. 10/2015 ABI's in setting of recurrent claudication: R 0.97, L 0.77 (reduced by 0.12 from prior study).  . Renal artery stenosis ([place]   a. 10/2005 s/p L renal artery stenting.  . Marland Kitchenespiratory failure requiring intubation ([place]01/10/2016   by [facility], extubated after arrival to [place]  . TIA (transient ischemic attack) 06/2010    Current Outpatient Prescriptions  Medication Sig Dispense Refill  . allopurinol (ZYLOPRIM) 100 MG tablet Take 100 mg by mouth daily.    . Marland Kitchenmiodarone (PACERONE) 200 MG tablet Take 200 mg by mouth 2 (two) times daily.    . Marland Kitchentorvastatin (LIPITOR) 20 MG tablet Take 20 mg by mouth daily.    . Blood Glucose Monitoring Suppl (FIFTY50 GLUCOSE METER 2.0) w/Device KIT     . Coenzyme Q10 (CO Q 10) 100 MG CAPS Take 100 mg by mouth daily.    . Marland KitchenLIQUIS 2.5 MG TABS tablet TAKE ONE TABLET BY MOUTH TWICE DAILY 60 tablet 3  . furosemide (LASIX) 40 MG tablet Take 1 tablet (40 mg total) by mouth daily. 30 tablet 6  . gabapentin (NEURONTIN) 300 MG capsule Take 300 mg by mouth at bedtime.    . Magnesium 500 MG CAPS Take 500 mg by mouth daily.    . niacin 500 MG tablet Take 500 mg by mouth daily with breakfast.    . OXYGEN Inhale 4 L into the lungs at bedtime.     . Marland Kitchenolpidem (AMBIEN) 10 MG tablet Take 5 mg by mouth at bedtime.     . nitroGLYCERIN (NITROSTAT) 0.4 MG SL tablet Place 0.4 mg under the tongue every 5 (five) minutes as needed for chest pain (maximum 3 tablets).     No current facility-administered medications for this encounter.     Allergies  Allergen Reactions  . Amoxicillin Itching       . Biaxin [Clarithromycin] Itching and Swelling    Lip swelling     . Erythromycin Itching  . Penicillins Hives and Itching    Has patient had a PCN reaction causing immediate rash, facial/tongue/throat swelling, SOB or lightheadedness with hypotension: Yes Has patient had a PCN reaction causing severe rash involving mucus  membranes or skin necrosis: No Has patient had a PCN reaction that required hospitalization No Has patient had a PCN reaction occurring within the last 10 years: No If all of the above answers are "NO", then may proceed with Cephalosporin use.  . Shellfish Allergy Itching        . Sulfa Antibiotics Other (See Comments)    AKI and hyperkalemia      Social History   Social History  . Marital status: Married    Spouse name: N/A  . Number of children: N/A  . Years of education: N/A   Occupational History  .  Retired    Social History Main Topics  . Smoking status: Former Smoker    Years: 52.00    Types: Cigarettes    Quit date: 08/11/2004  . Smokeless tobacco: Never Used  . Alcohol use No  . Drug use: No  . Sexual activity: Yes    Partners: Female   Other Topics Concern  . Not on file   Social History Narrative   Retired.  Lives in [place] with his wife.  Tries to remain active.      Family History  Problem Relation Age of Onset  . Alzheimer's disease Mother   . Heart disease Father   . Heart disease Paternal Grandfather   . Heart attack Paternal Grandfather     Vitals:   07/05/16 1506  BP: (!) 104/50  Pulse: 83  SpO2: 98%  Weight: 180 lb 12 oz (82 kg)    Wt Readings from Last 3 Encounters:  07/05/16 180 lb 12 oz (82 kg)  06/10/16 180 lb (81.6 kg)  05/21/16 181 lb 11.2 oz (82.4 kg)     PHYSICAL EXAM:  General: Elderly. NAD.   HEENT: Normal  Neck: supple. JVP 9 with mild HJR. Carotids 2+ bilat; no bruits. No thyromegaly or nodule noted.  Cor: PMI nondisplaced. RRR, 2/6 MR, TR Lungs: CTAB on room air.   Abdomen: soft, NT, mildly distended, no HSM. No bruits or masses. +BS  Extremities: no cyanosis, clubbing, rash. No lower extremity edema. Warm.  Neuro: A&Ox3. cranial nerves grossly intact. moves all 4 extremities w/o difficulty. Affect pleasant  ASSESSMENT & PLAN:  1. Chronic systolic HF:  TEE 2/94/76 LVEF 15%, no evidence of endocarditis.    Echo 11/13/15 EF 20-25%.  Nonischemic cardiomyopathy.  Medtronic CRT-D.   Chronic NYHA III  - Volume status increased on exam and trending up on Optivol. Increase lasix to 40 in am and 20 in afternoon. Hold afternoon dose if weight under 172. Arlyce Harman stopped 2/18 due to AKI -Keep off bb with fatigue.  - No Ace with CKD.   - Reinforced fluid restriction to < 2 L daily, sodium restriction to less than 2000 mg daily, and the importance of daily weights.   2. CKD III -IV -Recent AKI with creatinine up 1.6->2.4. Arlyce Harman stopped and lasix held for several days.  -Recheck BMET today 3. NSVT and frequent PVCs: Off BB with recent low output. 4. Carotid stenosis > 70% on L  5. CAD - Mild non obstructive on cath 11/2015 - Now off ASA and plavix with Eliquis.  - Continue atorvastatin 20 mg daily.   6. Paroxysmal, A fib - New onset 9/23 deivice interrogation confims.   - s/p TEE/DCCV 02/25/16.  - No further afib since DCCV as of 04/20/16.  - No bleeding problems. Continue Eliquis 2.5 mg BID. He is off ASA and plavix.  - Continue amio 200 mg daily.  - This patients CHA2DS2-VASc Score and unadjusted Ischemic Stroke Rate (% per year) is equal to 11.2 % stroke rate/year from a score of 7    Glori Bickers, MD 07/05/2016

## 2016-07-05 NOTE — Patient Instructions (Signed)
Take lasix 1 pill in the morning (40mg ) and half a pill in the afternoon (20mg ). Do not take afternoon dose if weight under 172 pounds.   Continue other medications as prescribed.   Call if symptoms worse or more swelling.

## 2016-07-21 ENCOUNTER — Other Ambulatory Visit: Payer: Self-pay | Admitting: Internal Medicine

## 2016-07-22 ENCOUNTER — Ambulatory Visit (INDEPENDENT_AMBULATORY_CARE_PROVIDER_SITE_OTHER): Payer: Medicare Other

## 2016-07-22 ENCOUNTER — Ambulatory Visit (INDEPENDENT_AMBULATORY_CARE_PROVIDER_SITE_OTHER): Payer: Medicare Other | Admitting: *Deleted

## 2016-07-22 DIAGNOSIS — I5022 Chronic systolic (congestive) heart failure: Secondary | ICD-10-CM

## 2016-07-22 DIAGNOSIS — I428 Other cardiomyopathies: Secondary | ICD-10-CM

## 2016-07-22 DIAGNOSIS — Z95810 Presence of automatic (implantable) cardiac defibrillator: Secondary | ICD-10-CM | POA: Diagnosis not present

## 2016-07-22 NOTE — Progress Notes (Signed)
Remote ICD transmission.   

## 2016-07-22 NOTE — Progress Notes (Signed)
EPIC Encounter for ICM Monitoring  Patient Name: Scott Parsons is a 83 y.o. male Date: 07/22/2016 Primary Care Physican: [name], MD Primary Cardiologist:Berry/Bensimhon Electrophysiologist: Allred Dry Weight:    176.2 lbs  Bi-V Pacing:  99.6%       Heart Failure questions reviewed, pt symptomatic with SOB the last couple of days and had ER visit yesterday.  He was given 1 extra fluid at the ER and breathing has improved.   Thoracic impedance abnormal suggesting fluid accumulation since last ICM transmission on 06/15/2016.    Prescribed and confirmed dosage: Furosemide 40 mg 1 tablet every am and 0.5 tablet (20 mg total) every pm.  Since last office visit with Dr [name] on 07/05/2016, patient has self adjusted Furosemide and took additional 40 mg tablet for total of 100 mg x 2 days.  Labs:  07/05/2016 Creatinine 2.30, BUN 29, Potassium 3.8, Sodium 139, EGFR 24-28 06/17/2016 Creatinine 2.21, BUN 32, Potassium 4.0, Sodium 137, EGFR 26-30 06/10/2016 Creatinine 2.37, BUN 35, Potassium 4.2, Sodium 135, EGFR 24-27 05/21/2016 Creatinine 1.56, BUN 30, Potassium 3.6, Sodium 139, EGFR 39-45  05/20/2016 Creatinine 1.51, BUN 32, Potassium 3.9, Sodium 140, EGFR 41-47  05/19/2016 Creatinine 1.47, BUN 34, Potassium 3.6, Sodium 140, EGFR 42-49  05/18/2016 Creatinine 1.90, BUN 46, Potassium 3.4, Sodium 143, EGFR 31-36  05/17/2016 Creatinine 2.49, BUN 55, Potassium 3.4, Sodium 141, EGFR 22-26  05/16/2016 Creatinine 2.79, BUN 50, Potassium 3.6, Sodium 138, EGFR 19-22  05/15/2016 Creatinine 2.86, BUN 44, Potassium 4.8, Sodium 134, EGFR 19-22  04/28/2016 Creatinine 2.46, BUN 45, Potassium 4.1, Sodium 139, EGFR 23-26  04/20/2016 Creatinine 2.31, BUN 43, Potassium 4.0, Sodium 138, EGFR 25-28    Recommendations:   07/21/2016 ER instructions were to increase Furosemide to 80 mg am and 20 mg pm today (3/15) and 07/23/2016.    Follow-up plan: ICM clinic phone appointment on 07/26/2016 to recheck fluid  levels.  Copy of ICM check sent to Darrick Grinder, NP with HF clinic, Dr [name] and Dr [name] for review and if any recommendations will call him back.    3 month ICM trend: 07/22/2016   1 Year ICM trend:      Rosalene Billings, RN 07/22/2016 9:15 AM

## 2016-07-23 ENCOUNTER — Encounter: Payer: Self-pay | Admitting: Cardiology

## 2016-07-23 LAB — CUP PACEART REMOTE DEVICE CHECK
Battery Voltage: 2.97 V
Brady Statistic AP VP Percent: 85.9 %
Brady Statistic AS VP Percent: 13.93 %
Brady Statistic AS VS Percent: 0.07 %
Date Time Interrogation Session: 20180315131240
HIGH POWER IMPEDANCE MEASURED VALUE: 73 Ohm
Implantable Lead Implant Date: 20150507
Implantable Lead Location: 753859
Implantable Lead Location: 753860
Implantable Pulse Generator Implant Date: 20150507
Lead Channel Impedance Value: 342 Ohm
Lead Channel Impedance Value: 361 Ohm
Lead Channel Impedance Value: 437 Ohm
Lead Channel Impedance Value: 513 Ohm
Lead Channel Impedance Value: 551 Ohm
Lead Channel Impedance Value: 551 Ohm
Lead Channel Impedance Value: 570 Ohm
Lead Channel Pacing Threshold Amplitude: 0.875 V
Lead Channel Pacing Threshold Amplitude: 1.125 V
Lead Channel Pacing Threshold Pulse Width: 0.4 ms
Lead Channel Pacing Threshold Pulse Width: 0.4 ms
Lead Channel Sensing Intrinsic Amplitude: 2.375 mV
Lead Channel Setting Pacing Amplitude: 2 V
Lead Channel Setting Pacing Amplitude: 2.5 V
Lead Channel Setting Pacing Pulse Width: 0.4 ms
Lead Channel Setting Sensing Sensitivity: 0.3 mV
MDC IDC LEAD IMPLANT DT: 20150507
MDC IDC LEAD IMPLANT DT: 20150507
MDC IDC LEAD LOCATION: 753858
MDC IDC MSMT BATTERY REMAINING LONGEVITY: 58 mo
MDC IDC MSMT LEADCHNL LV IMPEDANCE VALUE: 304 Ohm
MDC IDC MSMT LEADCHNL LV IMPEDANCE VALUE: 304 Ohm
MDC IDC MSMT LEADCHNL LV IMPEDANCE VALUE: 361 Ohm
MDC IDC MSMT LEADCHNL LV IMPEDANCE VALUE: 399 Ohm
MDC IDC MSMT LEADCHNL LV IMPEDANCE VALUE: 513 Ohm
MDC IDC MSMT LEADCHNL RA IMPEDANCE VALUE: 551 Ohm
MDC IDC MSMT LEADCHNL RA SENSING INTR AMPL: 2.375 mV
MDC IDC MSMT LEADCHNL RV PACING THRESHOLD AMPLITUDE: 0.75 V
MDC IDC MSMT LEADCHNL RV PACING THRESHOLD PULSEWIDTH: 0.4 ms
MDC IDC MSMT LEADCHNL RV SENSING INTR AMPL: 22.125 mV
MDC IDC MSMT LEADCHNL RV SENSING INTR AMPL: 22.125 mV
MDC IDC SET LEADCHNL LV PACING AMPLITUDE: 2.25 V
MDC IDC SET LEADCHNL LV PACING PULSEWIDTH: 0.4 ms
MDC IDC STAT BRADY AP VS PERCENT: 0.1 %
MDC IDC STAT BRADY RA PERCENT PACED: 85.85 %
MDC IDC STAT BRADY RV PERCENT PACED: 99.62 %

## 2016-07-26 ENCOUNTER — Ambulatory Visit (INDEPENDENT_AMBULATORY_CARE_PROVIDER_SITE_OTHER): Payer: Medicare Other

## 2016-07-26 DIAGNOSIS — I5022 Chronic systolic (congestive) heart failure: Secondary | ICD-10-CM | POA: Diagnosis not present

## 2016-07-26 DIAGNOSIS — Z95810 Presence of automatic (implantable) cardiac defibrillator: Secondary | ICD-10-CM | POA: Diagnosis not present

## 2016-07-26 NOTE — Progress Notes (Signed)
EPIC Encounter for ICM Monitoring  Patient Name: Scott Parsons is a 83 y.o. male Date: 07/26/2016 Primary Care Physican: [name], MD Primary Cardiologist:Berry/Bensimhon Electrophysiologist: Allred Dry Weight:unknown Bi-V Pacing:  99.7%              Spoke with wife, patient not home.  She said he seems to be feeling fine.     Thoracic impedance continues to be abnormal suggesting fluid accumulation since 06/14/2016 but is close to baseline.  Prescribed and confirmed dosage: Furosemide 40 mg 1 tablet every am and 0.5 tablet (20 mg total) every pm.   Labs:  07/05/2016 Creatinine 2.30, BUN 29, Potassium 3.8, Sodium 139, EGFR 24-28 06/17/2016 Creatinine 2.21, BUN 32, Potassium 4.0, Sodium 137, EGFR 26-30 06/10/2016 Creatinine 2.37, BUN 35, Potassium 4.2, Sodium 135, EGFR 24-27 01/12/2018Creatinine 1.56, BUN 30, Potassium 3.6, Sodium 139, EGFR 39-45  01/11/2018Creatinine 1.51, BUN 32, Potassium 3.9, Sodium 140, EGFR 41-47  01/10/2018Creatinine 1.47, BUN 34, Potassium 3.6, Sodium 140, EGFR 42-49  05/18/2016 Creatinine 1.90, BUN 46, Potassium 3.4, Sodium 143, EGFR 31-36  01/08/2018Creatinine 2.49, BUN 55, Potassium 3.4, Sodium 141, EGFR 22-26  01/07/2018Creatinine 2.79, BUN 50, Potassium 3.6, Sodium 138, EGFR 19-22  01/06/2018Creatinine 2.86, BUN 44, Potassium 4.8, Sodium 134, EGFR 19-22  04/28/2016 Creatinine 2.46, BUN 45, Potassium 4.1, Sodium 139, EGFR 23-26  04/20/2016 Creatinine 2.31, BUN 43, Potassium 4.0, Sodium 138, EGFR 25-28   Recommendations: No changes.   Follow-up plan: ICM clinic phone appointment on 08/02/2016.  HF office clinic appointment on 08/09/2016  Copy of ICM check sent to primary cardiologist and device physician.   3 month ICM trend: 07/26/2016   1 Year ICM trend:      Rosalene Billings, RN 07/26/2016 9:42 AM

## 2016-07-27 ENCOUNTER — Other Ambulatory Visit (HOSPITAL_COMMUNITY): Payer: Self-pay | Admitting: Student

## 2016-08-09 ENCOUNTER — Ambulatory Visit (HOSPITAL_COMMUNITY)
Admission: RE | Admit: 2016-08-09 | Discharge: 2016-08-09 | Disposition: A | Discharge status: Home / Self Care | Payer: Medicare Other | Source: Ambulatory Visit | Attending: Internal Medicine | Admitting: Internal Medicine

## 2016-08-09 ENCOUNTER — Telehealth (HOSPITAL_COMMUNITY): Payer: Self-pay | Admitting: *Deleted

## 2016-08-09 VITALS — BP 112/52 | HR 82 | Wt 180.2 lb

## 2016-08-09 DIAGNOSIS — Z7902 Long term (current) use of antithrombotics/antiplatelets: Secondary | ICD-10-CM | POA: Insufficient documentation

## 2016-08-09 DIAGNOSIS — Z880 Allergy status to penicillin: Secondary | ICD-10-CM | POA: Diagnosis not present

## 2016-08-09 DIAGNOSIS — I472 Ventricular tachycardia: Secondary | ICD-10-CM | POA: Diagnosis not present

## 2016-08-09 DIAGNOSIS — I2583 Coronary atherosclerosis due to lipid rich plaque: Secondary | ICD-10-CM

## 2016-08-09 DIAGNOSIS — I5022 Chronic systolic (congestive) heart failure: Secondary | ICD-10-CM | POA: Diagnosis present

## 2016-08-09 DIAGNOSIS — Z87891 Personal history of nicotine dependence: Secondary | ICD-10-CM | POA: Insufficient documentation

## 2016-08-09 DIAGNOSIS — I779 Disorder of arteries and arterioles, unspecified: Secondary | ICD-10-CM | POA: Diagnosis not present

## 2016-08-09 DIAGNOSIS — I6522 Occlusion and stenosis of left carotid artery: Secondary | ICD-10-CM | POA: Diagnosis not present

## 2016-08-09 DIAGNOSIS — R0602 Shortness of breath: Secondary | ICD-10-CM | POA: Diagnosis not present

## 2016-08-09 DIAGNOSIS — N179 Acute kidney failure, unspecified: Secondary | ICD-10-CM | POA: Diagnosis not present

## 2016-08-09 DIAGNOSIS — N184 Chronic kidney disease, stage 4 (severe): Secondary | ICD-10-CM

## 2016-08-09 DIAGNOSIS — E785 Hyperlipidemia, unspecified: Secondary | ICD-10-CM | POA: Insufficient documentation

## 2016-08-09 DIAGNOSIS — Z91013 Allergy to seafood: Secondary | ICD-10-CM | POA: Diagnosis not present

## 2016-08-09 DIAGNOSIS — Z79899 Other long term (current) drug therapy: Secondary | ICD-10-CM | POA: Insufficient documentation

## 2016-08-09 DIAGNOSIS — G4733 Obstructive sleep apnea (adult) (pediatric): Secondary | ICD-10-CM | POA: Diagnosis not present

## 2016-08-09 DIAGNOSIS — I739 Peripheral vascular disease, unspecified: Secondary | ICD-10-CM | POA: Insufficient documentation

## 2016-08-09 DIAGNOSIS — N289 Disorder of kidney and ureter, unspecified: Secondary | ICD-10-CM

## 2016-08-09 DIAGNOSIS — Z8673 Personal history of transient ischemic attack (TIA), and cerebral infarction without residual deficits: Secondary | ICD-10-CM | POA: Diagnosis not present

## 2016-08-09 DIAGNOSIS — I480 Paroxysmal atrial fibrillation: Secondary | ICD-10-CM

## 2016-08-09 DIAGNOSIS — Z820 Family history of epilepsy and other diseases of the nervous system: Secondary | ICD-10-CM | POA: Insufficient documentation

## 2016-08-09 DIAGNOSIS — I10 Essential (primary) hypertension: Secondary | ICD-10-CM

## 2016-08-09 DIAGNOSIS — Z7901 Long term (current) use of anticoagulants: Secondary | ICD-10-CM | POA: Insufficient documentation

## 2016-08-09 DIAGNOSIS — I429 Cardiomyopathy, unspecified: Secondary | ICD-10-CM | POA: Insufficient documentation

## 2016-08-09 DIAGNOSIS — Z8249 Family history of ischemic heart disease and other diseases of the circulatory system: Secondary | ICD-10-CM | POA: Insufficient documentation

## 2016-08-09 DIAGNOSIS — I6523 Occlusion and stenosis of bilateral carotid arteries: Secondary | ICD-10-CM | POA: Diagnosis not present

## 2016-08-09 DIAGNOSIS — I2510 Atherosclerotic heart disease of native coronary artery without angina pectoris: Secondary | ICD-10-CM

## 2016-08-09 DIAGNOSIS — Z888 Allergy status to other drugs, medicaments and biological substances status: Secondary | ICD-10-CM | POA: Insufficient documentation

## 2016-08-09 DIAGNOSIS — I5042 Chronic combined systolic (congestive) and diastolic (congestive) heart failure: Secondary | ICD-10-CM | POA: Diagnosis not present

## 2016-08-09 DIAGNOSIS — I447 Left bundle-branch block, unspecified: Secondary | ICD-10-CM | POA: Diagnosis not present

## 2016-08-09 DIAGNOSIS — Z95810 Presence of automatic (implantable) cardiac defibrillator: Secondary | ICD-10-CM | POA: Diagnosis not present

## 2016-08-09 DIAGNOSIS — R7303 Prediabetes: Secondary | ICD-10-CM | POA: Insufficient documentation

## 2016-08-09 LAB — BASIC METABOLIC PANEL
ANION GAP: 9 (ref 5–15)
BUN: 37 mg/dL — ABNORMAL HIGH (ref 6–20)
CO2: 26 mmol/L (ref 22–32)
Calcium: 8.9 mg/dL (ref 8.9–10.3)
Chloride: 103 mmol/L (ref 101–111)
Creatinine, Ser: 2.62 mg/dL — ABNORMAL HIGH (ref 0.61–1.24)
GFR calc Af Amer: 24 mL/min — ABNORMAL LOW (ref >60)
GFR, EST NON AFRICAN AMERICAN: 21 mL/min — AB (ref >60)
GLUCOSE: 138 mg/dL — AB (ref 65–99)
POTASSIUM: 3.9 mmol/L (ref 3.5–5.1)
Sodium: 138 mmol/L (ref 135–145)

## 2016-08-09 LAB — BRAIN NATRIURETIC PEPTIDE: B Natriuretic Peptide: 1330.1 pg/mL — ABNORMAL HIGH (ref 0.0–100.0)

## 2016-08-09 NOTE — Progress Notes (Signed)
Patient ID: Scott Parsons, male   DOB: October 26, 1929, 83 y.o.   MRN: [id number]    Advanced Heart Failure Clinic Note    Primary Care: [name], MD Primary Cardiologist: Dr Gwenlyn Found Primary HF: Dr. [name]   HPI: Scott Parsons is a 83 y.o. male with history of NICM, Chronic systolic CHF (Echo 09/3662 EF 20-25%), LBBB s/p biV ICD, CKD III-IV, HTN, PAD, Carotid stenosis, and RAS s/p renal stenting.   He was previously followed closely by Carolynn Serve at [facility]. In 09/2015 was found to have worsening of renal fxn along with hyperkalemia. He was admitted to [facility] and his lisinopril, valsartan, and spironolactone were discontinued. Labs were stable upon f/u and he was subsequently placed on entresto.  Was taken to [facility] ED on 11/11/15 with acute worsening dyspnea. Required Bipap. Tx to [facility] for further eval. Was admitted by CCM for resp failure. Had significant symptom improvement with diuresis. Catheterization as below with mild non-obstructive CAD and well compensated hemodynamics after diuresis. Diuresed 2.2 L. Weight 190 on discharge.   Admitted 8/26-8/30 with flash edema. Diuresed with IV lasix. ICD adjusted to increase pacing. Out 10 lbs with IV lasix. Discharge weight was 180 lbs.   Admitted 1/6 through 05/21/16 with respiratory failure and volume overload. Required short term intubation. Treated for pneumonia. Placed on dobutamine and levophed. Discharged on lasix 40 mg daily. Discharge weight 181 pounds.   Pt presents today for regular follow up. Feeling OK overall.  Weight unchanged by our scales.  Weight at home 173.8, says lowest it has been in sometime. No SOB walking around house. Gets SOB with chores and has to sit and rest for a little while. No CP, lightheadedness or dizziness. No orthopnea or PND. Taking all medications as directed. Has taken extra lasix ~ 3 times since last visit.   Optivol - Fluid index trending up, thoracic impedence below threshold for past few months. Pt  active ~ 1 hr a day.   Labs  05/21/2016: K 3.6 Creatinine 1.56     R/LHC 11/13/15  Mild non-obstructive CAD, and well compensated hemodynamics after diuresis.  RA = 3 RV = 36/3 PA = 34/12 (20) PCW = 12 Ao = 118/48 (72) LV = 129/18 Fick cardiac output/index = 5.6/2.6 PVR = 1.4 WU SVR = 984 FA sat = 87% PA sat = 58%, 55%   TEE 05/20/16 LVEF 15%, no evidence of endocarditis.   Echo 11/13/15  LVEF 20-25% with diffuse hypokinesis. Mild AS, Moderate MR, Severe LAE   Past Medical History:  Diagnosis Date  . Carotid artery disease ([place])    a. 04/2015 mild to mod bilat ICA stenosis;  b. 08/345 >42% LICA stenosis, no significant dzs on right.  . Chronic combined systolic and diastolic CHF, NYHA class 4 ([place])    a. 01/2013 Echo: EF 25-30%; b. 08/2013 Echo: EF 15-20%; c. 04/2014 Echo: EF 20-25%, mild LVH, Gr1 DD, mild MR, mod dil LA.  . CKD (chronic kidney disease), stage IV ([place])    a. baseline creat of ~ 1.8;  b. 09/2015 Admitted to [facility] with AKI and hyperkalemia.  [name] History of stroke   . Hyperlipidemia   . Hypertensive heart disease with CHF (congestive heart failure) ([place])   . ICD (implantable cardioverter-defibrillator), dual, in situ    a. 09/2013 s/p MDT Hillery Aldo XT CRT-D (ser #[id number]).  . LBBB (left bundle branch block)   . Non-obstructive CAD    a. 11/2001 Cath [facility]): LM nl, LAD  45m LCX nondom, nl, RI large, nl, RCA dominant, 461m.  . Marland Kitchenonischemic cardiomyopathy ([facility]   a. 11/2001 nonobs cath; b. 09/2013 s/p MDT ViHillery AldoT CRT-D (ser #B#[id number]); c. 04/2014 Echo: EF 20-25%;  d. Followed @ [facility] by K. AdAndree ElkMD.  . OSA (obstructive sleep apnea)    a. Does not tolerate/use CPAP.  . Pre-diabetes   . PVD (peripheral vascular disease) ([place]   a. 10/2005 PTA: right external iliac artery angioplasty and left renal artery angioplasty and stenting;  b. 10/2015 ABI's in setting of recurrent claudication: R 0.97, L 0.77 (reduced by 0.12 from prior study).  . Renal artery  stenosis ([place]   a. 10/2005 s/p L renal artery stenting.  . Marland Kitchenespiratory failure requiring intubation ([facility]/10/2016   by [facility], extubated after arrival to [place]  . TIA (transient ischemic attack) 06/2010    Current Outpatient Prescriptions  Medication Sig Dispense Refill  . allopurinol (ZYLOPRIM) 100 MG tablet Take 100 mg by mouth daily.    . Marland Kitchenmiodarone (PACERONE) 200 MG tablet Take 200 mg by mouth 2 (two) times daily.    . Marland Kitchentorvastatin (LIPITOR) 20 MG tablet Take 20 mg by mouth daily.    . Blood Glucose Monitoring Suppl (FIFTY50 GLUCOSE METER 2.0) w/Device KIT     . ELIQUIS 2.5 MG TABS tablet TAKE ONE TABLET BY MOUTH TWICE DAILY 60 tablet 3  . furosemide (LASIX) 40 MG tablet Take 1 tab in AM and 1/2 tab in PM, can take extra tab as needed    . gabapentin (NEURONTIN) 300 MG capsule Take 300 mg by mouth at bedtime.    . Magnesium 500 MG CAPS Take 500 mg by mouth daily.    . niacin 500 MG tablet Take 500 mg by mouth daily with breakfast.    . nitroGLYCERIN (NITROSTAT) 0.4 MG SL tablet Place 0.4 mg under the tongue every 5 (five) minutes as needed for chest pain (maximum 3 tablets).    . OXYGEN Inhale 4 L into the lungs at bedtime.     . Marland Kitchenolpidem (AMBIEN) 10 MG tablet Take 5 mg by mouth at bedtime.      No current facility-administered medications for this encounter.     Allergies  Allergen Reactions  . Amoxicillin Itching       . Biaxin [Clarithromycin] Itching and Swelling    Lip swelling     . Erythromycin Itching  . Penicillins Hives and Itching    Has patient had a PCN reaction causing immediate rash, facial/tongue/throat swelling, SOB or lightheadedness with hypotension: Yes Has patient had a PCN reaction causing severe rash involving mucus membranes or skin necrosis: No Has patient had a PCN reaction that required hospitalization No Has patient had a PCN reaction occurring within the last 10 years: No If all of the above answers are "NO", then may proceed  with Cephalosporin use.  . Shellfish Allergy Itching        . Sulfa Antibiotics Other (See Comments)    AKI and hyperkalemia      Social History   Social History  . Marital status: Married    Spouse name: N/A  . Number of children: N/A  . Years of education: N/A   Occupational History  . Retired    Social History Main Topics  . Smoking status: Former Smoker    Years: 52.00    Types: Cigarettes    Quit date: 08/11/2004  . Smokeless tobacco: Never Used  . Alcohol  use No  . Drug use: No  . Sexual activity: Yes    Partners: Female   Other Topics Concern  . Not on file   Social History Narrative   Retired.  Lives in [place] with his wife.  Tries to remain active.      Family History  Problem Relation Age of Onset  . Alzheimer's disease Mother   . Heart disease Father   . Heart disease Paternal Grandfather   . Heart attack Paternal Grandfather     Vitals:   08/09/16 1040  BP: (!) 112/52  Pulse: 82  SpO2: 98%  Weight: 180 lb 4 oz (81.8 kg)    Wt Readings from Last 3 Encounters:  08/09/16 180 lb 4 oz (81.8 kg)  07/05/16 180 lb 12 oz (82 kg)  06/10/16 180 lb (81.6 kg)     PHYSICAL EXAM:  General: Elderly and chronically ill appearing.   HEENT: Normal Neck: Supple. JVP 6-7 cm. Carotids 2+ bilat; no bruits. No thyromegaly or nodule noted.   Cor: PMI non-displaced. RRR. 2/6 MR, TR.  Lungs: Clear, normal effort Abdomen: soft, NT, ND, no HSM. No bruits or masses. +BS  Extremities: no cyanosis, clubbing, rash. Trace ankle edema. Warm.   Neuro: Alert & oriented x 3. Cranial nerves grossly intact. Moves all 4 extremities w/o difficulty. Affect pleasant    ASSESSMENT & PLAN:  1. Chronic systolic HF:  TEE 9/83/38 LVEF 15%, no evidence of endocarditis.  Echo 11/13/15 EF 20-25%.  Nonischemic cardiomyopathy.  [employer] CRT-D.   Chronic NYHA III  - Volume status looks OK on exam but trending up on Optivol. Taking occasional extra lasix. - With tenuous kidney  function, will continue Lasix 40 mg q am and 20 mg q pm with extra tab as needed for weight gain.  Arlyce Harman stopped 2/18 due to AKI -Keep off bb with fatigue.  - No Ace with CKD.   - Reinforced fluid restriction to < 2 L daily, sodium restriction to less than 2000 mg daily, and the importance of daily weights.   2. CKD III -IV -Recent AKI with creatinine up 1.6->2.4. Arlyce Harman stopped and lasix held for several days and remained stable with no change. - Repeat BMET today.  3. NSVT and frequent PVCs: Off BB with recent low output and fatigue.  4. Carotid stenosis > 70% on L  5. CAD - Mild non obstructive on cath 11/2015 - Now off ASA and plavix with Eliquis.  - Continue atorvastatin 20 mg daily.   - No change to current plan.  6. Paroxysmal, A fib - New onset 9/23 deivice interrogation confirmed..   - s/p TEE/DCCV 02/25/16.  - No further afib since DCCV as of 04/20/16. Per ICD interrogation today.  - No bleeding problems. Continue Eliquis 2.5 mg BID. He is off ASA and plavix.  - Continue amio 200 mg daily.  - Patients CHA2DS2-VASc Score is 7.   - No change  Doing OK overall. Will not push medications with tenuous nature. No ACE/ARB with CKD IV and soft pressures.  BMET today. Follow up 6-8 weeks, sooner with symptoms.   Shirley Friar, PA-C  08/09/2016

## 2016-08-09 NOTE — Telephone Encounter (Signed)
-----   Message from Graciella Freer, PA-C sent at 08/09/2016  1:12 PM EDT -----  Creatinine rising. Hold lasix x 2 days and recheck BMET on Thursday.  If symptoms worsen may need to come into hospital.   BNP up. Worry about low output with recent requirement of inotropic support.  Casimiro Needle [address] [place], PA-C 08/09/2016 1:10 PM

## 2016-08-09 NOTE — Patient Instructions (Signed)
Labs today  Your physician recommends that you schedule a follow-up appointment in: 6-8 weeks   

## 2016-08-09 NOTE — Addendum Note (Signed)
Encounter addended by: Graciella Freer, PA-C on: 08/09/2016 12:21 PM<BR>    Actions taken: LOS modified, Follow-up modified

## 2016-08-09 NOTE — Telephone Encounter (Signed)
Notes recorded by Noralee Space, RN on 08/09/2016 at 4:48 PM EDT Pt aware, agreeable and verbalizes understanding, repeat labs [facility] 4/5

## 2016-08-12 ENCOUNTER — Ambulatory Visit (HOSPITAL_COMMUNITY)
Admission: RE | Admit: 2016-08-12 | Discharge: 2016-08-12 | Disposition: A | Discharge status: Home / Self Care | Payer: Medicare Other | Source: Ambulatory Visit | Attending: Internal Medicine | Admitting: Internal Medicine

## 2016-08-12 DIAGNOSIS — N289 Disorder of kidney and ureter, unspecified: Secondary | ICD-10-CM | POA: Diagnosis not present

## 2016-08-12 LAB — BASIC METABOLIC PANEL
ANION GAP: 9 (ref 5–15)
BUN: 37 mg/dL — ABNORMAL HIGH (ref 6–20)
CALCIUM: 8.8 mg/dL — AB (ref 8.9–10.3)
CO2: 27 mmol/L (ref 22–32)
CREATININE: 2.78 mg/dL — AB (ref 0.61–1.24)
Chloride: 100 mmol/L — ABNORMAL LOW (ref 101–111)
GFR calc Af Amer: 23 mL/min — ABNORMAL LOW (ref >60)
GFR, EST NON AFRICAN AMERICAN: 19 mL/min — AB (ref >60)
GLUCOSE: 263 mg/dL — AB (ref 65–99)
Potassium: 3.9 mmol/L (ref 3.5–5.1)
Sodium: 136 mmol/L (ref 135–145)

## 2016-08-12 NOTE — Progress Notes (Signed)
No ICM remote transmission received for 08/02/2016 and next ICM transmission scheduled for 08/31/2016.

## 2016-08-16 ENCOUNTER — Other Ambulatory Visit: Payer: Self-pay | Admitting: Internal Medicine

## 2016-08-16 DIAGNOSIS — K5900 Constipation, unspecified: Secondary | ICD-10-CM | POA: Insufficient documentation

## 2016-08-23 ENCOUNTER — Encounter (HOSPITAL_COMMUNITY): Payer: Medicare Other

## 2016-08-31 ENCOUNTER — Ambulatory Visit (INDEPENDENT_AMBULATORY_CARE_PROVIDER_SITE_OTHER): Payer: Medicare Other

## 2016-08-31 DIAGNOSIS — Z95810 Presence of automatic (implantable) cardiac defibrillator: Secondary | ICD-10-CM

## 2016-08-31 DIAGNOSIS — I5042 Chronic combined systolic (congestive) and diastolic (congestive) heart failure: Secondary | ICD-10-CM | POA: Diagnosis not present

## 2016-09-02 NOTE — Progress Notes (Signed)
EPIC Encounter for ICM Monitoring  Patient Name: Scott Parsons is a 83 y.o. male Date: 09/02/2016 Primary Care Physican: [name], MD Primary Cardiologist:Berry/Bensimhon Electrophysiologist: Allred Dry Weight:172.2.lbs  Bi-V Pacing:  99.6%       Heart Failure questions reviewed, pt has gained 2 lbs since being discharged from [facility] on 08/26/2016 after 10 day stay.  He was admitted due to difficulty breathing and was diuresed during hospitalization. He reported breathing is currently at his baseline.  He stated his local hospital tried to transfer him to [facility] but was told it would several days before he would have a bed which is the reason for [facility] Admission.  Patient's cardiologist in [place] was Carolynn Serve, MD.    Thoracic impedance abnormal suggesting fluid accumulation since 06/17/2016.  Prescribed and confirmed dosage: Furosemide 40 mg 1 tab in AM and 1/2 tab in PM, can take extra tab as needed  Labs:  08/25/2016 Creatinine 1.90, BUN 44, Potassium 3.6, Sodium 135, EGFR 34-41 Care Everywhere 08/12/2016 Creatinine 2.78, BUN 37, Potassium 3.9. Sodium 136, EGFR 19-23 08/09/2016 Creatinine 2.62, BUN 37, Potassium 3.9, Sodium 138, EGFR 21-24 07/05/2016 Creatinine 2.30, BUN 29, Potassium 3.8, Sodium 139, EGFR 24-28 06/17/2016 Creatinine 2.21, BUN 32, Potassium 4.0, Sodium 137, EGFR 26-30 06/10/2016 Creatinine 2.37, BUN 35, Potassium 4.2, Sodium 135, EGFR 24-27 01/12/2018Creatinine 1.56, BUN 30, Potassium 3.6, Sodium 139, EGFR 39-45  01/11/2018Creatinine 1.51, BUN 32, Potassium 3.9, Sodium 140, EGFR 41-47  01/10/2018Creatinine 1.47, BUN 34, Potassium 3.6, Sodium 140, EGFR 42-49  05/18/2016 Creatinine 1.90, BUN 46, Potassium 3.4, Sodium 143, EGFR 31-36  01/08/2018Creatinine 2.49, BUN 55, Potassium 3.4, Sodium 141, EGFR 22-26  01/07/2018Creatinine 2.79, BUN 50, Potassium 3.6, Sodium 138, EGFR 19-22  01/06/2018Creatinine 2.86, BUN 44, Potassium 4.8,  Sodium 134, EGFR 19-22  04/28/2016 Creatinine 2.46, BUN 45, Potassium 4.1, Sodium 139, EGFR 23-26  04/20/2016 Creatinine 2.31, BUN 43, Potassium 4.0, Sodium 138, EGFR 25-28    Recommendations:  Copy of ICM check sent to Dr [name], Dr Curt Bears and HF PA Barrington Ellison.   Follow-up plan: ICM clinic phone appointment on 09/07/2016.  Patient reported having a HF office appointment scheduled for 5/2 at 9:30 am but not seeing it listed in epic appointments.   3 month ICM trend: 09/02/2016   1 Year ICM trend:      Rosalene Billings, RN 09/02/2016 2:50 PM

## 2016-09-03 NOTE — Progress Notes (Signed)
Call to patient and advised Dr Elberta Fortis reviewed transmission and he recommended he may take Furosemide 40 mg 1 tablet twice a day x 3 days only and then return to prescribed dosage of 40 mg AM and 20 mg in PM.  He reported HF clinic appointment is 09/14/2016 for post hospital follow up.  Next ICM remote transmission 09/07/2016.  Advised if symptoms worsen over the weekend to use ER if needed.

## 2016-09-03 NOTE — Progress Notes (Signed)
Received: Today  Message Contents  Will Jorja Loa, MD  Karie Soda, RN        If patients weight going up, can take full dose of lasix in the evening for the next 3 days.   Previous Messages

## 2016-09-07 ENCOUNTER — Telehealth: Payer: Self-pay

## 2016-09-07 ENCOUNTER — Ambulatory Visit (INDEPENDENT_AMBULATORY_CARE_PROVIDER_SITE_OTHER): Payer: Self-pay

## 2016-09-07 DIAGNOSIS — Z95810 Presence of automatic (implantable) cardiac defibrillator: Secondary | ICD-10-CM

## 2016-09-07 DIAGNOSIS — I5042 Chronic combined systolic (congestive) and diastolic (congestive) heart failure: Secondary | ICD-10-CM

## 2016-09-07 NOTE — Progress Notes (Signed)
EPIC Encounter for ICM Monitoring  Patient Name: Scott Parsons is a 83 y.o. male Date: 09/07/2016 Primary Care Physican: [name], MD Primary Cardiologist:Berry/Bensimhon Electrophysiologist: Allred Dry Weight: 173lbs  Bi-V Pacing: 99.2%                                              Heart Failure questions reviewed, pt reported no changes in the last few days.    He had a post hospital follow up visit with [facility] Cardiologist, Carolynn Serve MD, today (he had a 10 day stay at [facility] and discharged on 08/26/2016).        [facility] physician told patient he thought he may be a little dry on physical exam which does not correlate with Optivol.    Thoracic impedance remains abnormal suggesting fluid accumulation after taking Furosemide 40 mg 1 tablet twice a day x 3 days as ordered.    Prescribed and confirmed dosage: Furosemide 40 mg 1 tab in AM and 1/2 tab in PM, can take extra tab as needed  Labs:  08/25/2016 Creatinine 1.90, BUN 44, Potassium 3.6, Sodium 135, EGFR 34-41 Care Everywhere 08/12/2016 Creatinine 2.78, BUN 37, Potassium 3.9. Sodium 136, EGFR 19-23 08/09/2016 Creatinine 2.62, BUN 37, Potassium 3.9, Sodium 138, EGFR 21-24 07/05/2016 Creatinine 2.30, BUN 29, Potassium 3.8, Sodium 139, EGFR 24-28 06/17/2016 Creatinine 2.21, BUN 32, Potassium 4.0, Sodium 137, EGFR 26-30 06/10/2016 Creatinine 2.37, BUN 35, Potassium 4.2, Sodium 135, EGFR 24-27 01/12/2018Creatinine 1.56, BUN 30, Potassium 3.6, Sodium 139, EGFR 39-45  01/11/2018Creatinine 1.51, BUN 32, Potassium 3.9, Sodium 140, EGFR 41-47  01/10/2018Creatinine 1.47, BUN 34, Potassium 3.6, Sodium 140, EGFR 42-49  05/18/2016 Creatinine 1.90, BUN 46, Potassium 3.4, Sodium 143, EGFR 31-36  01/08/2018Creatinine 2.49, BUN 55, Potassium 3.4, Sodium 141, EGFR 22-26  01/07/2018Creatinine 2.79, BUN 50, Potassium 3.6, Sodium 138, EGFR 19-22  01/06/2018Creatinine 2.86, BUN 44, Potassium 4.8, Sodium 134,  EGFR 19-22  04/28/2016 Creatinine 2.46, BUN 45, Potassium 4.1, Sodium 139, EGFR 23-26  04/20/2016 Creatinine 2.31, BUN 43, Potassium 4.0, Sodium 138, EGFR 25-28   Recommendations:   No changes today since patient has a post hospital visit with Dr [name] 5/8.   Encouraged to call for fluid symptoms.  Follow-up plan: ICM clinic phone appointment on 09/20/2016 since he has an office appointment scheduled on 09/14/2016 with HF clinic.  Copy of ICM check sent to primary cardiologist and device physician.   3 month ICM trend: 09/07/2016   1 Year ICM trend:      Rosalene Billings, RN 09/07/2016 9:04 AM

## 2016-09-07 NOTE — Telephone Encounter (Signed)
Remote ICM transmission received.  Attempted patient call and left message to return call.   

## 2016-09-14 ENCOUNTER — Encounter (HOSPITAL_COMMUNITY): Payer: Medicare Other

## 2016-09-20 ENCOUNTER — Telehealth: Payer: Self-pay | Admitting: Cardiology

## 2016-09-20 NOTE — Telephone Encounter (Signed)
LMOVM reminding pt to send remote transmission.   

## 2016-09-23 DIAGNOSIS — R630 Anorexia: Secondary | ICD-10-CM | POA: Insufficient documentation

## 2016-09-23 DIAGNOSIS — R5381 Other malaise: Secondary | ICD-10-CM | POA: Insufficient documentation

## 2016-09-24 NOTE — Progress Notes (Signed)
No ICM remote transmission received for 09/20/2016 and next ICM transmission scheduled for 10/05/2016.

## 2016-10-05 ENCOUNTER — Telehealth: Payer: Self-pay | Admitting: Cardiology

## 2016-10-05 ENCOUNTER — Encounter (HOSPITAL_COMMUNITY): Payer: Medicare Other

## 2016-10-05 NOTE — Telephone Encounter (Signed)
LMOVM reminding pt to send remote transmission.   

## 2016-10-07 ENCOUNTER — Telehealth: Payer: Self-pay | Admitting: Internal Medicine

## 2016-10-07 NOTE — Telephone Encounter (Signed)
ICM remote transmission rescheduled for 10/21/2016 since patient is currently in the hospital.

## 2016-10-07 NOTE — Telephone Encounter (Signed)
New message      Calling to let Britta Mccreedy know that pt is in the hosp and cannot send a remote transmission.  He has been in the hosp for 4 weeks.

## 2016-10-07 NOTE — Telephone Encounter (Signed)
LVM for pt stating that would plan to check his device at his apt with Gypsy Balsam 11/01/2016, direct number to device clinic given for pt to call with any questions.

## 2016-10-11 DIAGNOSIS — R9431 Abnormal electrocardiogram [ECG] [EKG]: Secondary | ICD-10-CM | POA: Insufficient documentation

## 2016-10-14 ENCOUNTER — Other Ambulatory Visit: Payer: Self-pay | Admitting: Cardiovascular Disease

## 2016-10-14 DIAGNOSIS — I701 Atherosclerosis of renal artery: Secondary | ICD-10-CM

## 2016-10-14 DIAGNOSIS — I739 Peripheral vascular disease, unspecified: Secondary | ICD-10-CM

## 2016-10-21 ENCOUNTER — Telehealth: Payer: Self-pay | Admitting: Cardiology

## 2016-10-21 ENCOUNTER — Encounter: Payer: Medicare Other | Admitting: *Deleted

## 2016-10-21 DIAGNOSIS — D500 Iron deficiency anemia secondary to blood loss (chronic): Secondary | ICD-10-CM | POA: Insufficient documentation

## 2016-10-21 NOTE — Telephone Encounter (Signed)
Confirmed remote transmission w/ pt wife.   

## 2016-10-22 ENCOUNTER — Telehealth: Payer: Self-pay

## 2016-10-22 ENCOUNTER — Encounter: Payer: Self-pay | Admitting: Cardiology

## 2016-10-22 NOTE — Telephone Encounter (Signed)
Patient called and stated he has been in the hospital twice in the last 45 days at [facility] and was in inpatient rehab for 3 weeks.  He is feeling better and denied any fluid symptoms.  He stated he will send remote transmission on 10/28/2016

## 2016-10-27 ENCOUNTER — Ambulatory Visit (HOSPITAL_COMMUNITY)
Admission: RE | Admit: 2016-10-27 | Discharge: 2016-10-27 | Disposition: A | Discharge status: Home / Self Care | Payer: Medicare Other | Source: Ambulatory Visit | Attending: Cardiovascular Disease | Admitting: Cardiovascular Disease

## 2016-10-27 DIAGNOSIS — I701 Atherosclerosis of renal artery: Secondary | ICD-10-CM

## 2016-10-27 DIAGNOSIS — I739 Peripheral vascular disease, unspecified: Secondary | ICD-10-CM | POA: Insufficient documentation

## 2016-10-28 ENCOUNTER — Ambulatory Visit (INDEPENDENT_AMBULATORY_CARE_PROVIDER_SITE_OTHER): Payer: Medicare Other

## 2016-10-28 ENCOUNTER — Other Ambulatory Visit: Payer: Self-pay | Admitting: Cardiovascular Disease

## 2016-10-28 DIAGNOSIS — I5042 Chronic combined systolic (congestive) and diastolic (congestive) heart failure: Secondary | ICD-10-CM

## 2016-10-28 DIAGNOSIS — Z95810 Presence of automatic (implantable) cardiac defibrillator: Secondary | ICD-10-CM | POA: Diagnosis not present

## 2016-10-28 DIAGNOSIS — I739 Peripheral vascular disease, unspecified: Secondary | ICD-10-CM

## 2016-10-28 NOTE — Progress Notes (Signed)
Received: Today  Message Contents  Hillis Range, MD  Scott Parsons, Scott Igo, RN        Looks like he is trying to put the fluid back on.  Needs very close follow-up with Bensimhon's team.

## 2016-10-28 NOTE — Progress Notes (Signed)
EPIC Encounter for ICM Monitoring  Patient Name: Scott Parsons is a 83 y.o. male Date: 10/28/2016 Primary Care Physican: [name], MD Primary Cardiologist:Berry/Bensimhon Electrophysiologist: Allred Dry Weight: 151lbs  Bi-V Pacing:  99.4%       Heart Failure questions reviewed, pt asymptomatic since 10/14/2016 hospital discharge.  He has lost 20 pounds during last 2 hospitalizations.  Discharge weight on 10/14/2016 was 151 lbs   Thoracic impedance continues to be abnormal suggesting fluid accumulation .    Prescribed dosage: Medication changed at time of last hospital discharge (10/14/16) to Bumetanide 1mg  1.5 tablets every AM and 1 tablet every PM.  Potassium 20 mEq 1 tablet daily.    Recommendations:  He reported limiting salt to 1500 mg daily. Copy of ICM check sent to Dr Gala Romney and Dr Johney Frame for review and if any recommendations will call him back.   Follow-up plan: ICM clinic phone appointment on 11/11/2016 patient has defib office check on 11/04/2016 with Gypsy Balsam and office appointment scheduled on 11/03/2016 with HF clinic, Otilio Saber, [place].  3 month ICM trend: 10/28/2016   1 Year ICM trend:      Karie Soda, RN 10/28/2016 1:45 PM

## 2016-10-31 NOTE — Progress Notes (Deleted)
Electrophysiology Office Note Date: 10/31/2016  ID:  Scott Parsons, DOB [birth year], MRN [id number]  PCP: [name], MD Primary Cardiologist: Donnelly Electrophysiologist: Allred  CC: Routine ICD follow-up  Scott Parsons is a 83 y.o. male seen today for Dr [name].  He presents today for routine electrophysiology followup.  Since last being seen in our clinic, the patient reports doing very well. He denies chest pain, palpitations, dyspnea, PND, orthopnea, nausea, vomiting, dizziness, syncope, edema, weight gain, or early satiety.  He has not had ICD shocks.   Device History: MDT CRTD implanted 2015 for NICM, CHF History of appropriate therapy: No History of AAD therapy: Yes - amiodarone for AF    Past Medical History:  Diagnosis Date  . Carotid artery disease ([place])    a. 04/2015 mild to mod bilat ICA stenosis;  b. 0/9381 >82% LICA stenosis, no significant dzs on right.  . Chronic combined systolic and diastolic CHF, NYHA class 4 ([place])    a. 01/2013 Echo: EF 25-30%; b. 08/2013 Echo: EF 15-20%; c. 04/2014 Echo: EF 20-25%, mild LVH, Gr1 DD, mild MR, mod dil LA.  . CKD (chronic kidney disease), stage IV ([place])    a. baseline creat of ~ 1.8;  b. 09/2015 Ad[facility] to [facility] with AKI and hyperkalemia.  [name] History of stroke   . Hyperlipidemia   . Hypertensive heart disease with CHF (congestive heart failure) ([place])   . ICD (implantable cardioverter-defibrillator), dual, in situ    a. 09/2013 s/p MDT Hillery Aldo XT CRT-D (ser #[id number]).  . LBBB (left bundle branch block)   . Non-obstructive CAD    a. 11/2001 Cath [facility]): LM nl, LAD 41m LCX nondom, nl, RI large, nl, RCA dominant, 440m.  . Marland Kitchenonischemic cardiomyopathy ([place]   a. 11/2001 nonobs cath; b. 09/2013 s/p MDT ViHillery AldoT CRT-D (ser #[id number]); c. 04/2014 Echo: EF 20-25%;  d. Followed @ [facility] by K. AdAndree ElkMD.  . OSA (obstructive sleep apnea)    a. Does not tolerate/use CPAP.  . Pre-diabetes   . PVD (peripheral vascular  disease) ([place]   a. 10/2005 PTA: right external iliac artery angioplasty and left renal artery angioplasty and stenting;  b. 10/2015 ABI's in setting of recurrent claudication: R 0.97, L 0.77 (reduced by 0.12 from prior study).  . Renal artery stenosis ([facility]   a. 10/2005 s/p L renal artery stenting.  . Marland Kitchenespiratory failure requiring intubation ([facility]inton[place]01/10/2016   [facility], extubated after arrival to [place]  . TIA (transient ischemic attack) 06/2010   Past Surgical History:  Procedure Laterality Date  . BI-VENTRICULAR IMPLANTABLE CARDIOVERTER DEFIBRILLATOR N/A 09/13/2013   Procedure: BI-VENTRICULAR IMPLANTABLE CARDIOVERTER DEFIBRILLATOR  (CRT-D);  Surgeon: JaCoralyn MarkMD;  [facility];  Service: Cardiovascular;  Laterality: N/A;  . BI-VENTRICULAR IMPLANTABLE CARDIOVERTER DEFIBRILLATOR  (CRT-D)  09-13-2013   MDT CRTD implanted by Dr AlRayann Heman. CARDIAC CATHETERIZATION  2003   Mild LV dysfuntion. he had apical and inferoapical wall motion abnormalities and EF of 45%.  . Marland KitchenARDIAC CATHETERIZATION  10/18/2005   STENTS: left renal artery as well as both iliac arteries. Was stented with 1272m 4mm53mart Nitinol self-expanding stent replaced with a 9mm 46mmm b4moon at nominal pressures, resulting in a reduction od 50% proximal right external iliac artery  stenosis to 0% residual.  . CARDIAC CATHETERIZATION N/A 11/13/2015   Procedure: Right/Left Heart Cath and Coronary Angiography;  Surgeon: DanielJolaine Artist Location:  [place] INVASIVE CV LAB;  Service: Cardiovascular;  Laterality: N/A;  . CARDIOVERSION N/A 02/25/2016   Procedure: CARDIOVERSION;  Surgeon: Jolaine Artist, MD;  Location: [facility] ENDOSCOPY;  Service: Cardiovascular;  Laterality: N/A;  . HAND SURGERY  01/1995  . HERNIA REPAIR    . TEE WITHOUT CARDIOVERSION N/A 02/25/2016   Procedure: TRANSESOPHAGEAL ECHOCARDIOGRAM (TEE);  Surgeon: Jolaine Artist, MD;  [facility] END[facility]COPY;  Service: Cardiovascular;  Laterality:  N/A;  . TEE WITHOUT CARDIOVERSION N/A 05/20/2016   Procedure: TRANSESOPHAGEAL ECHOCARDIOGRAM (TEE);  Surgeon: Jolaine Artist, MD;  Location: [facility] ENDOSCOPY;  Service: Cardiovascular;  Laterality: N/A;    Current Outpatient Prescriptions  Medication Sig Dispense Refill  . allopurinol (ZYLOPRIM) 100 MG tablet Take 100 mg by mouth daily.    [name] amiodarone (PACERONE) 200 MG tablet Take 200 mg by mouth 2 (two) times daily.    [name] atorvastatin (LIPITOR) 20 MG tablet Take 20 mg by mouth daily.    . Blood Glucose Monitoring Suppl (FIFTY50 GLUCOSE METER 2.0) w/Device KIT     . bumetanide (BUMEX) 1 MG tablet Take 1 mg by mouth. Patient reported taking 1.5 tablets in AM and 1 tablet in PM since hospital discharge on 10/14/2016    . ELIQUIS 2.5 MG TABS tablet TAKE ONE TABLET BY MOUTH TWICE DAILY 60 tablet 3  . furosemide (LASIX) 40 MG tablet Take 1 tab in AM and 1/2 tab in PM, can take extra tab as needed.  Patient reported Lasix discontinued on 10/14/2016 at time of hospital discharge.    . gabapentin (NEURONTIN) 300 MG capsule Take 300 mg by mouth at bedtime.    . Magnesium 500 MG CAPS Take 500 mg by mouth daily.    . niacin 500 MG tablet Take 500 mg by mouth daily with breakfast.    . nitroGLYCERIN (NITROSTAT) 0.4 MG SL tablet Place 0.4 mg under the tongue every 5 (five) minutes as needed for chest pain (maximum 3 tablets).    . OXYGEN Inhale 4 L into the lungs at bedtime.     [name] zolpidem (AMBIEN) 10 MG tablet Take 5 mg by mouth at bedtime.      No current facility-administered medications for this visit.     Allergies:   Amoxicillin; Biaxin [clarithromycin]; Erythromycin; Penicillins; Shellfish allergy; and Sulfa antibiotics   Social History: Social History   Social History  . Marital status: Married    Spouse name: N/A  . Number of children: N/A  . Years of education: N/A   Occupational History  . Retired    Social History Main Topics  . Smoking status: Former Smoker    Years: 52.00     Types: Cigarettes    Quit date: 08/11/2004  . Smokeless tobacco: Never Used  . Alcohol use No  . Drug use: No  . Sexual activity: Yes    Partners: Female   Other Topics Concern  . Not on file   Social History Narrative   Retired.  Lives in [place] with his wife.  Tries to remain active.    Family History: Family History  Problem Relation Age of Onset  . Alzheimer's disease Mother   . Heart disease Father   . Heart disease Paternal Grandfather   . Heart attack Paternal Grandfather     Review of Systems: All other systems reviewed and are otherwise negative except as noted above.   Physical Exam: VS:  There were no vitals taken for this visit. , BMI There is no height  or weight on file to calculate BMI.  GEN- The patient is well appearing, alert and oriented x 3 today.   HEENT: normocephalic, atraumatic; sclera clear, conjunctiva pink; hearing intact; oropharynx clear; neck supple, no JVP Lymph- no cervical lymphadenopathy Lungs- Clear to ausculation bilaterally, normal work of breathing.  No wheezes, rales, rhonchi Heart- Regular rate and rhythm, no murmurs, rubs or gallops, PMI not laterally displaced GI- soft, non-tender, non-distended, bowel sounds present, no hepatosplenomegaly Extremities- no clubbing, cyanosis, or edema; DP/PT/radial pulses 2+ bilaterally MS- no significant deformity or atrophy Skin- warm and dry, no rash or lesion; ICD pocket well healed Psych- euthymic mood, full affect Neuro- strength and sensation are intact  ICD interrogation- reviewed in detail today,  See PACEART report  EKG:  EKG is not ordered today.  Recent Labs: 04/22/2016: TSH 3.05 05/20/2016: ALT 18; Hemoglobin 12.8; Magnesium 2.1; Platelets 122 08/09/2016: B Natriuretic Peptide 1,330.1 08/12/2016: BUN 37; Creatinine, Ser 2.78; Potassium 3.9; Sodium 136   Wt Readings from Last 3 Encounters:  08/09/16 180 lb 4 oz (81.8 kg)  07/05/16 180 lb 12 oz (82 kg)  06/10/16 180 lb (81.6 kg)       Other studies Reviewed: Additional studies/ records that were reviewed today include: Dr [name] and Dr Bensimhon's office notes   Assessment and Plan:  1.  Chronic systolic dysfunction euvolemic today Stable on an appropriate medical regimen Normal ICD function See Pace Art report No changes today Followed closely by AHF and ICM clinic   2.  Persistent atrial fibrillation Burden by device interrogation ***% V rates *** Continue amiodarone. LFT's, TSH today Continue Eliquis for CHADS2VASC of 6 (2.'5mg'$  twice daily for age >9, creat >1.5)   Current medicines are reviewed at length with the patient today.   The patient does not have concerns regarding his medicines.  The following changes were made today:  none  Labs/ tests ordered today include: none No orders of the defined types were placed in this encounter.    Disposition:   Follow up with AHF as scheduled, ICM clinic, Dr [name] 6 months     Signed, Chanetta Marshall, NP 10/31/2016 7:37 PM  [place] [address] [place] [address] [phone] (office) [phone] (fax)

## 2016-11-03 ENCOUNTER — Telehealth (HOSPITAL_COMMUNITY): Payer: Self-pay

## 2016-11-03 ENCOUNTER — Inpatient Hospital Stay (HOSPITAL_COMMUNITY): Admission: RE | Admit: 2016-11-03 | Payer: Medicare Other | Source: Ambulatory Visit

## 2016-11-03 NOTE — Telephone Encounter (Signed)
CHF Clinic appointment reminder call placed to patient for upcoming post-hospital follow up.  Does understand purpose of this appointment and where CHF Clinic is located? Patient was unaware of apt today and rescheduled to next Monday  How is patient feeling? Well, no complaints  Does patient have all of their medications since their recent discharge? Yes  Patient also reminded to take all medications as prescribed on the day of his/her appointment and to bring all medications to this appointment.  Advised to call our office for tardiness or cancellations/rescheduling needs.  Scott Parsons, Bettina Gavia

## 2016-11-04 ENCOUNTER — Encounter: Payer: Self-pay | Admitting: *Deleted

## 2016-11-04 ENCOUNTER — Encounter: Payer: Medicare Other | Admitting: Nurse Practitioner

## 2016-11-04 DIAGNOSIS — I340 Nonrheumatic mitral (valve) insufficiency: Secondary | ICD-10-CM | POA: Insufficient documentation

## 2016-11-05 ENCOUNTER — Telehealth (HOSPITAL_COMMUNITY): Payer: Self-pay | Admitting: Cardiology

## 2016-11-05 ENCOUNTER — Encounter: Payer: Self-pay | Admitting: Internal Medicine

## 2016-11-05 NOTE — Telephone Encounter (Signed)
CHF Clinic appointment reminder call placed to patient for upcoming post-hospital follow up.  Does understand purpose of this appointment and where CHF Clinic is located? YES   How is patient feeling? WEAK, REPORTS HE IS ONLY ABLE TO GET AROUND THE HOUSE   Does patient have all of their medications since their recent discharge? YES, REPORTS MULTIPLE MEDICATIONS WERE CHANGED EX: LASIX->DEMADEX  Patient also reminded to take all medications as prescribed on the day of his/her appointment and to bring all medications to this appointment.  Advised to call our office for tardiness or cancellations/rescheduling needs.  Makayla Confer, CMA

## 2016-11-08 ENCOUNTER — Encounter (HOSPITAL_COMMUNITY): Payer: Self-pay

## 2016-11-08 ENCOUNTER — Encounter (HOSPITAL_COMMUNITY): Payer: Self-pay | Admitting: *Deleted

## 2016-11-08 ENCOUNTER — Telehealth (HOSPITAL_COMMUNITY): Payer: Self-pay | Admitting: *Deleted

## 2016-11-08 ENCOUNTER — Ambulatory Visit (HOSPITAL_COMMUNITY)
Admission: RE | Admit: 2016-11-08 | Discharge: 2016-11-08 | Disposition: A | Discharge status: Home / Self Care | Payer: Medicare Other | Source: Ambulatory Visit | Attending: Cardiology | Admitting: Cardiology

## 2016-11-08 ENCOUNTER — Other Ambulatory Visit (HOSPITAL_COMMUNITY): Payer: Self-pay | Admitting: *Deleted

## 2016-11-08 VITALS — BP 124/56 | HR 73 | Wt 153.4 lb

## 2016-11-08 DIAGNOSIS — E785 Hyperlipidemia, unspecified: Secondary | ICD-10-CM | POA: Insufficient documentation

## 2016-11-08 DIAGNOSIS — N184 Chronic kidney disease, stage 4 (severe): Secondary | ICD-10-CM | POA: Diagnosis not present

## 2016-11-08 DIAGNOSIS — Z79899 Other long term (current) drug therapy: Secondary | ICD-10-CM | POA: Insufficient documentation

## 2016-11-08 DIAGNOSIS — Z87891 Personal history of nicotine dependence: Secondary | ICD-10-CM | POA: Insufficient documentation

## 2016-11-08 DIAGNOSIS — D500 Iron deficiency anemia secondary to blood loss (chronic): Secondary | ICD-10-CM | POA: Insufficient documentation

## 2016-11-08 DIAGNOSIS — I2510 Atherosclerotic heart disease of native coronary artery without angina pectoris: Secondary | ICD-10-CM | POA: Diagnosis not present

## 2016-11-08 DIAGNOSIS — Z880 Allergy status to penicillin: Secondary | ICD-10-CM | POA: Insufficient documentation

## 2016-11-08 DIAGNOSIS — I4819 Other persistent atrial fibrillation: Secondary | ICD-10-CM

## 2016-11-08 DIAGNOSIS — Z8249 Family history of ischemic heart disease and other diseases of the circulatory system: Secondary | ICD-10-CM | POA: Diagnosis not present

## 2016-11-08 DIAGNOSIS — I472 Ventricular tachycardia: Secondary | ICD-10-CM | POA: Insufficient documentation

## 2016-11-08 DIAGNOSIS — Z820 Family history of epilepsy and other diseases of the nervous system: Secondary | ICD-10-CM | POA: Diagnosis not present

## 2016-11-08 DIAGNOSIS — Z95810 Presence of automatic (implantable) cardiac defibrillator: Secondary | ICD-10-CM | POA: Diagnosis not present

## 2016-11-08 DIAGNOSIS — I739 Peripheral vascular disease, unspecified: Secondary | ICD-10-CM | POA: Insufficient documentation

## 2016-11-08 DIAGNOSIS — I481 Persistent atrial fibrillation: Secondary | ICD-10-CM | POA: Diagnosis not present

## 2016-11-08 DIAGNOSIS — I447 Left bundle-branch block, unspecified: Secondary | ICD-10-CM | POA: Insufficient documentation

## 2016-11-08 DIAGNOSIS — I5022 Chronic systolic (congestive) heart failure: Secondary | ICD-10-CM

## 2016-11-08 DIAGNOSIS — R0602 Shortness of breath: Secondary | ICD-10-CM | POA: Insufficient documentation

## 2016-11-08 DIAGNOSIS — R7303 Prediabetes: Secondary | ICD-10-CM | POA: Insufficient documentation

## 2016-11-08 DIAGNOSIS — Z91013 Allergy to seafood: Secondary | ICD-10-CM | POA: Diagnosis not present

## 2016-11-08 DIAGNOSIS — I6523 Occlusion and stenosis of bilateral carotid arteries: Secondary | ICD-10-CM | POA: Insufficient documentation

## 2016-11-08 DIAGNOSIS — Z8673 Personal history of transient ischemic attack (TIA), and cerebral infarction without residual deficits: Secondary | ICD-10-CM | POA: Insufficient documentation

## 2016-11-08 DIAGNOSIS — G4733 Obstructive sleep apnea (adult) (pediatric): Secondary | ICD-10-CM | POA: Diagnosis not present

## 2016-11-08 DIAGNOSIS — I5042 Chronic combined systolic (congestive) and diastolic (congestive) heart failure: Secondary | ICD-10-CM | POA: Diagnosis not present

## 2016-11-08 DIAGNOSIS — I429 Cardiomyopathy, unspecified: Secondary | ICD-10-CM | POA: Insufficient documentation

## 2016-11-08 DIAGNOSIS — Z888 Allergy status to other drugs, medicaments and biological substances status: Secondary | ICD-10-CM | POA: Insufficient documentation

## 2016-11-08 DIAGNOSIS — I480 Paroxysmal atrial fibrillation: Secondary | ICD-10-CM | POA: Insufficient documentation

## 2016-11-08 LAB — BASIC METABOLIC PANEL
Anion gap: 9 (ref 5–15)
BUN: 37 mg/dL — AB (ref 6–20)
CO2: 28 mmol/L (ref 22–32)
Calcium: 9.1 mg/dL (ref 8.9–10.3)
Chloride: 99 mmol/L — ABNORMAL LOW (ref 101–111)
Creatinine, Ser: 2.23 mg/dL — ABNORMAL HIGH (ref 0.61–1.24)
GFR calc non Af Amer: 25 mL/min — ABNORMAL LOW (ref >60)
GFR, EST AFRICAN AMERICAN: 29 mL/min — AB (ref >60)
Glucose, Bld: 132 mg/dL — ABNORMAL HIGH (ref 65–99)
Potassium: 3.8 mmol/L (ref 3.5–5.1)
SODIUM: 136 mmol/L (ref 135–145)

## 2016-11-08 LAB — CBC
HEMATOCRIT: 32.1 % — AB (ref 39.0–52.0)
Hemoglobin: 9.7 g/dL — ABNORMAL LOW (ref 13.0–17.0)
MCH: 26.7 pg (ref 26.0–34.0)
MCHC: 30.2 g/dL (ref 30.0–36.0)
MCV: 88.4 fL (ref 78.0–100.0)
Platelets: 298 10*3/uL (ref 150–400)
RBC: 3.63 MIL/uL — ABNORMAL LOW (ref 4.22–5.81)
RDW: 17.6 % — AB (ref 11.5–15.5)
WBC: 6.2 10*3/uL (ref 4.0–10.5)

## 2016-11-08 LAB — BRAIN NATRIURETIC PEPTIDE: B Natriuretic Peptide: 1565.5 pg/mL — ABNORMAL HIGH (ref 0.0–100.0)

## 2016-11-08 MED ORDER — POTASSIUM CHLORIDE CRYS ER 20 MEQ PO TBCR: 40.0000 meq | EXTENDED_RELEASE_TABLET | Freq: Every day | ORAL | 3 refills | Status: DC

## 2016-11-08 MED ORDER — BUMETANIDE 1 MG PO TABS: 2.0000 mg | ORAL_TABLET | Freq: Two times a day (BID) | ORAL | 3 refills | Status: DC

## 2016-11-08 NOTE — Patient Instructions (Signed)
Labs today (will call for abnormal results, otherwise no news is good news)  INCREASE Potassium to 40 mEq (2 Tablets) Once Daily  INCREASE Bumex to 2 mg (2 Tablets) Twice Daily  Follow up next week.

## 2016-11-08 NOTE — Progress Notes (Signed)
Patient ID: Scott Parsons, male   DOB: [birth year], 83 y.o.   MRN: [id number]    Advanced Heart Failure Clinic Note    Primary Care: [name], MD Primary Cardiologist: Dr Gwenlyn Found Primary HF: Dr. [name]   HPI: Scott Parsons is a 83 y.o. male with history of NICM, Chronic systolic CHF (Echo 05/3242 EF 20-25%), LBBB s/p biV ICD, CKD III-IV, HTN, PAD, Carotid stenosis, and RAS s/p renal stenting.   He was previously followed closely by Carolynn Serve at [facility]. In 09/2015 was found to have worsening of renal fxn along with hyperkalemia. He was admitted to [facility] and his lisinopril, valsartan, and spironolactone were discontinued. Labs were stable upon f/u and he was subsequently placed on entresto.  Was taken to [facility] ED on 11/11/15 with acute worsening dyspnea. Required Bipap. Tx to [facility] for further eval. Was admitted by CCM for resp failure. Had significant symptom improvement with diuresis. Catheterization as below with mild non-obstructive CAD and well compensated hemodynamics after diuresis. Diuresed 2.2 L. Weight 190 on discharge.   Admitted 8/26-8/30 with flash pulmonary edema. Diuresed with IV lasix. ICD adjusted to increase pacing. Out 10 lbs with IV lasix. Discharge weight was 180 lbs.   Admitted 1/6 through 05/21/16 with respiratory failure and volume overload. Required short term intubation. Treated for pneumonia. Placed on dobutamine and levophed. Discharged on lasix 40 mg daily. Discharge weight 181 pounds.   Admitted 09/23/16-10/14/16 at [facility] for CHF exacerbation. Also with anemia, heme + stool. Endoscopy did not show any source for bleeding. Required 2 units PRBC's. His Eliquis and ASA were held at discharge. Discharge weight was 155 pounds, Hgb was 8.7 at discharge.   He returns today for HF follow up. Overall feels very weak, so fatigued that he cannot eat. Says that he is SOB with dressing and showering. He is SOB at rest at times, he does not move much. Stays in  wheelchair or in his chair most days. Blood pressures at home are 90's/40's. Denies dizziness, syncope and presyncope.Denies chest pain and palpitations.   Optivol: Fluid markedly increased   Labs  05/21/2016: K 3.6 Creatinine 1.56     R/LHC 11/13/15  Mild non-obstructive CAD, and well compensated hemodynamics after diuresis.  RA = 3 RV = 36/3 PA = 34/12 (20) PCW = 12 Ao = 118/48 (72) LV = 129/18 Fick cardiac output/index = 5.6/2.6 PVR = 1.4 WU SVR = 984 FA sat = 87% PA sat = 58%, 55%   TEE 05/20/16 LVEF 15%, no evidence of endocarditis.   Echo 11/13/15  LVEF 20-25% with diffuse hypokinesis. Mild AS, Moderate MR, Severe LAE   Past Medical History:  Diagnosis Date  . Carotid artery disease ([place])    a. 04/2015 mild to mod bilat ICA stenosis;  b. 0/1027 >25% LICA stenosis, no significant dzs on right.  . Chronic combined systolic and diastolic CHF, NYHA class 4 ([place])    a. 01/2013 Echo: EF 25-30%; b. 08/2013 Echo: EF 15-20%; c. 04/2014 Echo: EF 20-25%, mild LVH, Gr1 DD, mild MR, mod dil LA.  . CKD (chronic kidney disease), stage IV ([place])    a. baseline creat of ~ 1.8;  b. 09/2015 Admitted to [facility] with AKI and hyperkalemia.  [name] History of stroke   . Hyperlipidemia   . Hypertensive heart disease with CHF (congestive heart failure) ([place])   . ICD (implantable cardioverter-defibrillator), dual, in situ    a. 09/2013 s/p MDT Hillery Aldo XT CRT-D (ser #[id number]).  [name]  LBBB (left bundle branch block)   . Non-obstructive CAD    a. 11/2001 Cath [facility]): LM nl, LAD 30m LCX nondom, nl, RI large, nl, RCA dominant, 470m.  . Marland Kitchenonischemic cardiomyopathy (HCBe[place]   a. 11/2001 nonobs cath; b. 09/2013 s/p MDT ViHillery AldoT CRT-D (ser #B#[id number]); c. 04/2014 Echo: EF 20-25%;  d. Followed @ [facility] by K. AdAndree ElkMD.  . OSA (obstructive sleep apnea)    a. Does not tolerate/use CPAP.  . Pre-diabetes   . PVD (peripheral vascular disease) ([place]Good[place]   a. 10/2005 PTA: right external iliac artery angioplasty and  left renal artery angioplasty and stenting;  b. 10/2015 ABI's in setting of recurrent claudication: R 0.97, L 0.77 (reduced by 0.12 from prior study).  . Renal artery stenosis ([place]   a. 10/2005 s/p L renal artery stenting.  . Marland Kitchenespiratory failure requiring intubation (HCCollinwood01/10/2016   [facility] ext[facility]ated after arrival to [place]  . TIA (transient ischemic attack) 06/2010    Current Outpatient Prescriptions  Medication Sig Dispense Refill  . allopurinol (ZYLOPRIM) 100 MG tablet Take 100 mg by mouth daily.    . Marland Kitchenmiodarone (PACERONE) 200 MG tablet Take 200 mg by mouth 2 (two) times daily.    . Marland Kitchentorvastatin (LIPITOR) 20 MG tablet Take 20 mg by mouth every evening.     . Blood Glucose Monitoring Suppl (FIFTY50 GLUCOSE METER 2.0) w/Device KIT     . bumetanide (BUMEX) 1 MG tablet Take 2 tablets (2 mg total) by mouth 2 (two) times daily. (Patient taking differently: Take 2 mg by mouth daily. ) 120 tablet 3  . ferrous sulfate 325 (65 FE) MG tablet Take 325 mg by mouth daily with breakfast.    . ipratropium (ATROVENT) 0.02 % nebulizer solution Inhale 2.5 mL (500 mcg total) by nebulization every six (6) hours as needed.    . Marland Kitchenpratropium-albuterol (DUONEB) 0.5-2.5 (3) MG/3ML SOLN Inhale 3 mL by nebulization every eight (8) hours as needed.    . mirtazapine (REMERON) 30 MG tablet Take 30 mg by mouth at bedtime.     . OXYGEN Inhale 4 L into the lungs at bedtime.     . potassium chloride SA (K-DUR,KLOR-CON) 20 MEQ tablet Take 2 tablets (40 mEq total) by mouth daily. (Patient taking differently: Take 20 mEq by mouth daily. ) 60 tablet 3  . vitamin C (ASCORBIC ACID) 500 MG tablet Take 500 mg by mouth daily.    . Marland Kitchenolpidem (AMBIEN) 10 MG tablet Take 5 mg by mouth at bedtime.     . niacin 500 MG tablet Take 500 mg by mouth daily.    . nitroGLYCERIN (NITROSTAT) 0.4 MG SL tablet Place 0.4 mg under the tongue every 5 (five) minutes as needed for chest pain (maximum 3 tablets).     No current  facility-administered medications for this encounter.     Allergies  Allergen Reactions  . Amoxicillin Itching       . Biaxin [Clarithromycin] Itching and Swelling    Lip swelling     . Erythromycin Itching  . Penicillins Hives and Itching    Has patient had a PCN reaction causing immediate rash, facial/tongue/throat swelling, SOB or lightheadedness with hypotension: Yes Has patient had a PCN reaction causing severe rash involving mucus membranes or skin necrosis: No Has patient had a PCN reaction that required hospitalization No Has patient had a PCN reaction occurring within the last 10 years: No If all of the above answers are "  NO", then may proceed with Cephalosporin use.  . Shellfish Allergy Itching        . Sulfa Antibiotics Other (See Comments)    Hyperkalemia  AKI and hyperkalemia      Social History   Social History  . Marital status: Married    Spouse name: N/A  . Number of children: N/A  . Years of education: N/A   Occupational History  . Retired    Social History Main Topics  . Smoking status: Former Smoker    Years: 52.00    Types: Cigarettes    Quit date: 08/11/2004  . Smokeless tobacco: Never Used  . Alcohol use No  . Drug use: No  . Sexual activity: Yes    Partners: Female   Other Topics Concern  . Not on file   Social History Narrative   Retired.  Lives in [place] with his wife.  Tries to remain active.      Family History  Problem Relation Age of Onset  . Alzheimer's disease Mother   . Heart disease Father   . Heart disease Paternal Grandfather   . Heart attack Paternal Grandfather     Vitals:   11/08/16 1103  BP: (!) 124/56  Pulse: 73  SpO2: 96%  Weight: 153 lb 6.4 oz (69.6 kg)    Wt Readings from Last 3 Encounters:  11/08/16 153 lb 6.4 oz (69.6 kg)  08/09/16 180 lb 4 oz (81.8 kg)  07/05/16 180 lb 12 oz (82 kg)     PHYSICAL EXAM:  General:Elderly and ill appearing male. Arrived in wheelchair.  HEENT: Normal. Neck:  Supple. JVP to jaw. Carotids 2+ bilat; no bruits. No thyromegaly or nodule noted.   Cor: PMI non-displaced. Regular rate and rhythm. 2/6 TR murmur.  Lungs: Clear in upper lobes bilaterally, diminished in bases.  Abdomen: Soft, non tender, non distended. no HSM. No bruits or masses.  Extremities: no cyanosis, clubbing, rash. Cool. 1+ pedal edema.   Neuro: Alert & oriented x 3. Cranial nerves grossly intact. Moves all 4 extremities w/o difficulty. Affect pleasant    ASSESSMENT & PLAN:  1. Chronic systolic HF: NICM, Echo 12/5460 EF 20-25%, TEE in 05/2016 EF 15%. S/p Medtronic ICD - NYHA IV - Volume status elevated on exam, confirmed by Optivol - Increase Bumex to 36m BID.  - Will plan for RHC in 2 days to further assess hemodynamics.  -Arlyce Harmanstopped in the past due to AKI.  - Increased Kcl supplementation.  - BMET today.  - No beta blocker with suspected low output.     2. CKD III -IV - Baseline creatinine 2.2-2.6 - BMET today.   3. NSVT and frequent PVCs:  - Will check K today.  - s/p ICD.   4. Carotid stenosis: - 770%LICA stenosis - Continue statin.  - Repeat carotid duplex in Dec. 2018.  - Can stop niacin.   5. CAD:  Mild non obstructive on cath 11/2015 - Not on ASA with stable CAD and on Eliquis.  - Continue atorvastatin 20 mg daily.    6. PAF:  - s/p TEE/DCCV 02/25/16.  - No further afib since DCCV as of 04/20/16.  - No Afib by device interrogation today.  - This patients CHA2DS2-VASc Score and unadjusted Ischemic Stroke Rate (% per year) is equal to 7.2 % stroke rate/year from a score of 5 Above score calculated as 1 point each if present [CHF, HTN, DM, Vascular=MI/PAD/Aortic Plaque, Age if 65-74, or Male], 2 points  each if present [Age > 75, or Stroke/TIA/TE] - Eliquis has been on hold since discharge on 10/14/16.  - Continue amio 200 mg daily.   7. Heme positive stool, blood loss anemia - Stat CBC and iron studies today.  - Likely needs feraheme.  - No melena    RHC on 11/11/16. Stat labs as above.   Arbutus Leas, NP  11/09/2016   Patient seen and examined with Jettie Booze, NP. We discussed all aspects of the encounter. I agree with the assessment and plan as stated above.   He feels very poorly.NYHA IV. Concern over recurrent anemia but CBC ok today.  Volume status mildly elevated on exam and by Optivoll. No VT/AF on device interrogation today.  Suspect low output HF. Will plan RHC this week.   Glori Bickers, MD  9:11 PM

## 2016-11-08 NOTE — Telephone Encounter (Signed)
Corliss Blacker Peptide  Order: [id number]  Status:  Final result Visible to patient:  No (Not Released) Dx:  Chronic systolic heart failure (HCC)  Notes recorded by Suezanne Cheshire, RN on 11/08/2016 at 1:15 PM EDT Patient has been scheduled for RHC with Dr. Gala Romney on July 5th, patient is aware and directs given over the phone. ------  Notes recorded by Little Ishikawa, NP on 11/08/2016 at 12:55 PM EDT Dr. Gala Romney would like to set this patient up for a RHC this week.

## 2016-11-11 ENCOUNTER — Encounter (HOSPITAL_COMMUNITY)
Admission: RE | Disposition: A | Discharge status: Home / Self Care | Payer: Self-pay | Source: Ambulatory Visit | Attending: Internal Medicine

## 2016-11-11 ENCOUNTER — Ambulatory Visit (HOSPITAL_COMMUNITY)
Admission: RE | Admit: 2016-11-11 | Discharge: 2016-11-11 | Disposition: A | Discharge status: Home / Self Care | Payer: Medicare Other | Source: Ambulatory Visit | Attending: Internal Medicine | Admitting: Internal Medicine

## 2016-11-11 ENCOUNTER — Ambulatory Visit (INDEPENDENT_AMBULATORY_CARE_PROVIDER_SITE_OTHER): Payer: Medicare Other

## 2016-11-11 DIAGNOSIS — D500 Iron deficiency anemia secondary to blood loss (chronic): Secondary | ICD-10-CM | POA: Insufficient documentation

## 2016-11-11 DIAGNOSIS — E785 Hyperlipidemia, unspecified: Secondary | ICD-10-CM | POA: Diagnosis not present

## 2016-11-11 DIAGNOSIS — N184 Chronic kidney disease, stage 4 (severe): Secondary | ICD-10-CM | POA: Diagnosis not present

## 2016-11-11 DIAGNOSIS — Z882 Allergy status to sulfonamides status: Secondary | ICD-10-CM | POA: Insufficient documentation

## 2016-11-11 DIAGNOSIS — I130 Hypertensive heart and chronic kidney disease with heart failure and stage 1 through stage 4 chronic kidney disease, or unspecified chronic kidney disease: Secondary | ICD-10-CM | POA: Diagnosis present

## 2016-11-11 DIAGNOSIS — I428 Other cardiomyopathies: Secondary | ICD-10-CM | POA: Insufficient documentation

## 2016-11-11 DIAGNOSIS — Z91013 Allergy to seafood: Secondary | ICD-10-CM | POA: Diagnosis not present

## 2016-11-11 DIAGNOSIS — I2510 Atherosclerotic heart disease of native coronary artery without angina pectoris: Secondary | ICD-10-CM | POA: Insufficient documentation

## 2016-11-11 DIAGNOSIS — I447 Left bundle-branch block, unspecified: Secondary | ICD-10-CM | POA: Insufficient documentation

## 2016-11-11 DIAGNOSIS — I6523 Occlusion and stenosis of bilateral carotid arteries: Secondary | ICD-10-CM | POA: Diagnosis not present

## 2016-11-11 DIAGNOSIS — Z8673 Personal history of transient ischemic attack (TIA), and cerebral infarction without residual deficits: Secondary | ICD-10-CM | POA: Diagnosis not present

## 2016-11-11 DIAGNOSIS — R7303 Prediabetes: Secondary | ICD-10-CM | POA: Insufficient documentation

## 2016-11-11 DIAGNOSIS — Z95810 Presence of automatic (implantable) cardiac defibrillator: Secondary | ICD-10-CM | POA: Insufficient documentation

## 2016-11-11 DIAGNOSIS — I701 Atherosclerosis of renal artery: Secondary | ICD-10-CM | POA: Diagnosis not present

## 2016-11-11 DIAGNOSIS — Z87891 Personal history of nicotine dependence: Secondary | ICD-10-CM | POA: Diagnosis not present

## 2016-11-11 DIAGNOSIS — I5022 Chronic systolic (congestive) heart failure: Secondary | ICD-10-CM

## 2016-11-11 DIAGNOSIS — G4733 Obstructive sleep apnea (adult) (pediatric): Secondary | ICD-10-CM | POA: Diagnosis not present

## 2016-11-11 DIAGNOSIS — I739 Peripheral vascular disease, unspecified: Secondary | ICD-10-CM | POA: Insufficient documentation

## 2016-11-11 DIAGNOSIS — I5042 Chronic combined systolic (congestive) and diastolic (congestive) heart failure: Secondary | ICD-10-CM | POA: Diagnosis not present

## 2016-11-11 DIAGNOSIS — Z880 Allergy status to penicillin: Secondary | ICD-10-CM | POA: Insufficient documentation

## 2016-11-11 HISTORY — PX: RIGHT HEART CATH: CATH118263

## 2016-11-11 LAB — POCT I-STAT 3, ART BLOOD GAS (G3+)
Acid-Base Excess: 1 mmol/L (ref 0.0–2.0)
BICARBONATE: 24.5 mmol/L (ref 20.0–28.0)
O2 Saturation: 93 %
PCO2 ART: 34.4 mmHg (ref 32.0–48.0)
PH ART: 7.459 — AB (ref 7.350–7.450)
PO2 ART: 61 mmHg — AB (ref 83.0–108.0)
TCO2: 25 mmol/L (ref 0–100)

## 2016-11-11 LAB — PROTIME-INR
INR: 1.06
PROTHROMBIN TIME: 13.8 s (ref 11.4–15.2)

## 2016-11-11 LAB — POCT I-STAT 3, VENOUS BLOOD GAS (G3P V)
ACID-BASE EXCESS: 5 mmol/L — AB (ref 0.0–2.0)
Acid-Base Excess: 5 mmol/L — ABNORMAL HIGH (ref 0.0–2.0)
BICARBONATE: 28.5 mmol/L — AB (ref 20.0–28.0)
BICARBONATE: 28.8 mmol/L — AB (ref 20.0–28.0)
O2 SAT: 64 %
O2 Saturation: 62 %
PCO2 VEN: 38.7 mmHg — AB (ref 44.0–60.0)
PH VEN: 7.473 — AB (ref 7.250–7.430)
PO2 VEN: 30 mmHg — AB (ref 32.0–45.0)
PO2 VEN: 31 mmHg — AB (ref 32.0–45.0)
TCO2: 30 mmol/L (ref 0–100)
TCO2: 30 mmol/L (ref 0–100)
pCO2, Ven: 39.4 mmHg — ABNORMAL LOW (ref 44.0–60.0)
pH, Ven: 7.476 — ABNORMAL HIGH (ref 7.250–7.430)

## 2016-11-11 LAB — BASIC METABOLIC PANEL
ANION GAP: 10 (ref 5–15)
BUN: 38 mg/dL — ABNORMAL HIGH (ref 6–20)
CALCIUM: 8.8 mg/dL — AB (ref 8.9–10.3)
CO2: 27 mmol/L (ref 22–32)
Chloride: 99 mmol/L — ABNORMAL LOW (ref 101–111)
Creatinine, Ser: 2.29 mg/dL — ABNORMAL HIGH (ref 0.61–1.24)
GFR, EST AFRICAN AMERICAN: 28 mL/min — AB (ref >60)
GFR, EST NON AFRICAN AMERICAN: 25 mL/min — AB (ref >60)
GLUCOSE: 134 mg/dL — AB (ref 65–99)
Potassium: 4 mmol/L (ref 3.5–5.1)
SODIUM: 136 mmol/L (ref 135–145)

## 2016-11-11 LAB — CBC
HCT: 30.7 % — ABNORMAL LOW (ref 39.0–52.0)
HEMOGLOBIN: 9.4 g/dL — AB (ref 13.0–17.0)
MCH: 27.6 pg (ref 26.0–34.0)
MCHC: 30.6 g/dL (ref 30.0–36.0)
MCV: 90 fL (ref 78.0–100.0)
Platelets: 255 10*3/uL (ref 150–400)
RBC: 3.41 MIL/uL — ABNORMAL LOW (ref 4.22–5.81)
RDW: 18.1 % — ABNORMAL HIGH (ref 11.5–15.5)
WBC: 4.9 10*3/uL (ref 4.0–10.5)

## 2016-11-11 SURGERY — RIGHT HEART CATH
Anesthesia: LOCAL

## 2016-11-11 MED ORDER — ASPIRIN 81 MG PO CHEW
CHEWABLE_TABLET | ORAL | Status: AC
  Administered 2016-11-11: 81 mg via ORAL
  Filled 2016-11-11: qty 1

## 2016-11-11 MED ORDER — SODIUM CHLORIDE 0.9 % IV SOLN: INTRAVENOUS | Status: AC

## 2016-11-11 MED ORDER — SODIUM CHLORIDE 0.9 % IV SOLN: 250.0000 mL | INTRAVENOUS | Status: DC | PRN

## 2016-11-11 MED ORDER — LIDOCAINE HCL (PF) 1 % IJ SOLN
INTRAMUSCULAR | Status: AC
  Filled 2016-11-11: qty 30

## 2016-11-11 MED ORDER — SODIUM CHLORIDE 0.9 % IV SOLN
INTRAVENOUS | Status: DC
  Administered 2016-11-11: 11:00:00 via INTRAVENOUS

## 2016-11-11 MED ORDER — HEPARIN (PORCINE) IN NACL 2-0.9 UNIT/ML-% IJ SOLN
INTRAMUSCULAR | Status: AC | PRN
  Administered 2016-11-11: 500 mL

## 2016-11-11 MED ORDER — HEPARIN (PORCINE) IN NACL 2-0.9 UNIT/ML-% IJ SOLN
INTRAMUSCULAR | Status: AC
  Filled 2016-11-11: qty 500

## 2016-11-11 MED ORDER — LIDOCAINE HCL (PF) 1 % IJ SOLN
INTRAMUSCULAR | Status: DC | PRN
  Administered 2016-11-11: 2 mL
  Administered 2016-11-11: 20 mL

## 2016-11-11 MED ORDER — ASPIRIN 81 MG PO CHEW
81.0000 mg | CHEWABLE_TABLET | ORAL | Status: AC
  Administered 2016-11-11: 81 mg via ORAL

## 2016-11-11 MED ORDER — SODIUM CHLORIDE 0.9% FLUSH: 3.0000 mL | INTRAVENOUS | Status: DC | PRN

## 2016-11-11 MED ORDER — SODIUM CHLORIDE 0.9% FLUSH: 3.0000 mL | Freq: Two times a day (BID) | INTRAVENOUS | Status: DC

## 2016-11-11 SURGICAL SUPPLY — 9 items
CATH BALLN WEDGE 5F 110CM (CATHETERS) ×1 IMPLANT
CATH SWAN GANZ 7F STRAIGHT (CATHETERS) ×1 IMPLANT
PACK CARDIAC CATHETERIZATION (CUSTOM PROCEDURE TRAY) ×2 IMPLANT
SHEATH GLIDE SLENDER 4/5FR (SHEATH) ×1 IMPLANT
SHEATH PINNACLE 7F 10CM (SHEATH) ×1 IMPLANT
TRANSDUCER W/STOPCOCK (MISCELLANEOUS) ×2 IMPLANT
TUBING ART PRESS 72  MALE/FEM (TUBING) ×1
TUBING ART PRESS 72 MALE/FEM (TUBING) IMPLANT
WIRE EMERALD 3MM-J .025X260CM (WIRE) ×1 IMPLANT

## 2016-11-11 NOTE — Interval H&P Note (Signed)
History and Physical Interval Note:  11/11/2016 1:35 PM  Scott Parsons  has presented today for surgery, with the diagnosis of chf  The various methods of treatment have been discussed with the patient and family. After consideration of risks, benefits and other options for treatment, the patient has consented to  Procedure(s): Right Heart Cath (N/A) as a surgical intervention .  The patient's history has been reviewed, patient examined, no change in status, stable for surgery.  I have reviewed the patient's chart and labs.  Questions were answered to the patient's satisfaction.     Mistee Soliman, Reuel Boom

## 2016-11-11 NOTE — H&P (View-Only) (Signed)
Patient ID: Scott Parsons, male   DOB: 08/09/1929, 83 y.o.   MRN: [id number]    Advanced Heart Failure Clinic Note    Primary Care: Scott Hoffman, MD Primary Cardiologist: Dr Scott Parsons Primary HF: Dr. Darrious Parsons   HPI: Scott Parsons is a 83 y.o. male with history of NICM, Chronic systolic CHF (Echo 11/2015 EF 20-25%), LBBB s/p biV ICD, CKD III-IV, HTN, PAD, Carotid stenosis, and RAS s/p renal stenting.   He was previously followed closely by [facility] at [facility]. In 09/2015 was found to have worsening of renal fxn along with hyperkalemia. He was admitted to [facility] and his lisinopril, valsartan, and spironolactone were discontinued. Labs were stable upon f/u and he was subsequently placed on entresto.  Was taken to [facility] on 11/11/15 with acute worsening dyspnea. Required Bipap. Tx to [facility] for further eval. Was admitted by CCM for resp failure. Had significant symptom improvement with diuresis. Catheterization as below with mild non-obstructive CAD and well compensated hemodynamics after diuresis. Diuresed 2.2 L. Weight 190 on discharge.   Admitted 8/26-8/30 with flash pulmonary edema. Diuresed with IV lasix. ICD adjusted to increase pacing. Out 10 lbs with IV lasix. Discharge weight was 180 lbs.   Admitted 1/6 through 05/21/16 with respiratory failure and volume overload. Required short term intubation. Treated for pneumonia. Placed on dobutamine and levophed. Discharged on lasix 40 mg daily. Discharge weight 181 pounds.   Admitted 09/23/16-10/14/16 [facility] for CHF exacerbation. Also with anemia, heme + stool. Endoscopy did not show any source for bleeding. Required 2 units PRBC's. His Eliquis and ASA were held at discharge. Discharge weight was 155 pounds, Hgb was 8.7 at discharge.   He returns today for HF follow up. Overall feels very weak, so fatigued that he cannot eat. Says that he is SOB with dressing and showering. He is SOB at rest at times, he does not move much. Stays in  wheelchair or in his chair most days. Blood pressures at home are 90's/40's. Denies dizziness, syncope and presyncope.Denies chest pain and palpitations.   Optivol: Fluid markedly increased   Labs  05/21/2016: K 3.6 Creatinine 1.56     R/LHC 11/13/15  Mild non-obstructive CAD, and well compensated hemodynamics after diuresis.  RA = 3 RV = 36/3 PA = 34/12 (20) PCW = 12 Ao = 118/48 (72) LV = 129/18 Fick cardiac output/index = 5.6/2.6 PVR = 1.4 WU SVR = 984 FA sat = 87% PA sat = 58%, 55%   TEE 05/20/16 LVEF 15%, no evidence of endocarditis.   Echo 11/13/15  LVEF 20-25% with diffuse hypokinesis. Mild AS, Moderate MR, Severe LAE   Past Medical History:  Diagnosis Date  . Carotid artery disease (HCC)    a. 04/2015 mild to mod bilat ICA stenosis;  b. 10/2015 >70% LICA stenosis, no significant dzs on right.  . Chronic combined systolic and diastolic CHF, NYHA class 4 (HCC)    a. 01/2013 Echo: EF 25-30%; b. 08/2013 Echo: EF 15-20%; c. 04/2014 Echo: EF 20-25%, mild LVH, Gr1 DD, mild MR, mod dil LA.  . CKD (chronic kidney disease), stage IV (HCC)    a. baseline creat of ~ 1.8;  b. 09/2015 Admitted to [facility] with AKI and hyperkalemia.  . History of stroke   . Hyperlipidemia   . Hypertensive heart disease with CHF (congestive heart failure) (HCC)   . ICD (implantable cardioverter-defibrillator), dual, in situ    a. 09/2013 s/p MDT Viva Quad XT CRT-D (ser #[id number]).  .   LBBB (left bundle branch block)   . Non-obstructive CAD    a. 11/2001 Cath [facility]): LM nl, LAD 30m LCX nondom, nl, RI large, nl, RCA dominant, 470m.  . Marland Kitchenonischemic cardiomyopathy ([facility]   a. 11/2001 nonobs cath; b. 09/2013 s/p MDT ViHillery AldoT CRT-D (ser #B#[id number]); c. 04/2014 Echo: EF 20-25%;  d. Followed @ [facility] by [facility].  . OSA (obstructive sleep apnea)    a. Does not tolerate/use CPAP.  . Pre-diabetes   . PVD (peripheral vascular disease) ([facility]   a. 10/2005 PTA: right external iliac artery angioplasty and  left renal artery angioplasty and stenting;  b. 10/2015 ABI's in setting of recurrent claudication: R 0.97, L 0.77 (reduced by 0.12 from prior study).  . Renal artery stenosis ([facility]   a. 10/2005 s/p L renal artery stenting.  . Marland Kitchenespiratory failure requiring intubation ([facility]/10/2016   by [facility], extubated after arrival to [place]  . TIA (transient ischemic attack) 06/2010    Current Outpatient Prescriptions  Medication Sig Dispense Refill  . allopurinol (ZYLOPRIM) 100 MG tablet Take 100 mg by mouth daily.    . Marland Kitchenmiodarone (PACERONE) 200 MG tablet Take 200 mg by mouth 2 (two) times daily.    . Marland Kitchentorvastatin (LIPITOR) 20 MG tablet Take 20 mg by mouth every evening.     . Blood Glucose Monitoring Suppl (FIFTY50 GLUCOSE METER 2.0) w/Device KIT     . bumetanide (BUMEX) 1 MG tablet Take 2 tablets (2 mg total) by mouth 2 (two) times daily. (Patient taking differently: Take 2 mg by mouth daily. ) 120 tablet 3  . ferrous sulfate 325 (65 FE) MG tablet Take 325 mg by mouth daily with breakfast.    . ipratropium (ATROVENT) 0.02 % nebulizer solution Inhale 2.5 mL (500 mcg total) by nebulization every six (6) hours as needed.    . Marland Kitchenpratropium-albuterol (DUONEB) 0.5-2.5 (3) MG/3ML SOLN Inhale 3 mL by nebulization every eight (8) hours as needed.    . mirtazapine (REMERON) 30 MG tablet Take 30 mg by mouth at bedtime.     . OXYGEN Inhale 4 L into the lungs at bedtime.     . potassium chloride SA (K-DUR,KLOR-CON) 20 MEQ tablet Take 2 tablets (40 mEq total) by mouth daily. (Patient taking differently: Take 20 mEq by mouth daily. ) 60 tablet 3  . vitamin C (ASCORBIC ACID) 500 MG tablet Take 500 mg by mouth daily.    . Marland Kitchenolpidem (AMBIEN) 10 MG tablet Take 5 mg by mouth at bedtime.     . niacin 500 MG tablet Take 500 mg by mouth daily.    . nitroGLYCERIN (NITROSTAT) 0.4 MG SL tablet Place 0.4 mg under the tongue every 5 (five) minutes as needed for chest pain (maximum 3 tablets).     No current  facility-administered medications for this encounter.     Allergies  Allergen Reactions  . Amoxicillin Itching       . Biaxin [Clarithromycin] Itching and Swelling    Lip swelling     . Erythromycin Itching  . Penicillins Hives and Itching    Has patient had a PCN reaction causing immediate rash, facial/tongue/throat swelling, SOB or lightheadedness with hypotension: Yes Has patient had a PCN reaction causing severe rash involving mucus membranes or skin necrosis: No Has patient had a PCN reaction that required hospitalization No Has patient had a PCN reaction occurring within the last 10 years: No If all of the above answers are "  NO", then may proceed with Cephalosporin use.  . Shellfish Allergy Itching        . Sulfa Antibiotics Other (See Comments)    Hyperkalemia  AKI and hyperkalemia      Social History   Social History  . Marital status: Married    Spouse name: N/A  . Number of children: N/A  . Years of education: N/A   Occupational History  . Retired    Social History Main Topics  . Smoking status: Former Smoker    Years: 52.00    Types: Cigarettes    Quit date: 08/11/2004  . Smokeless tobacco: Never Used  . Alcohol use No  . Drug use: No  . Sexual activity: Yes    Partners: Female   Other Topics Concern  . Not on file   Social History Narrative   Retired.  Lives in [place] with his wife.  Tries to remain active.      Family History  Problem Relation Age of Onset  . Alzheimer's disease Mother   . Heart disease Father   . Heart disease Paternal Grandfather   . Heart attack Paternal Grandfather     Vitals:   11/08/16 1103  BP: (!) 124/56  Pulse: 73  SpO2: 96%  Weight: 153 lb 6.4 oz (69.6 kg)    Wt Readings from Last 3 Encounters:  11/08/16 153 lb 6.4 oz (69.6 kg)  08/09/16 180 lb 4 oz (81.8 kg)  07/05/16 180 lb 12 oz (82 kg)     PHYSICAL EXAM:  General:Elderly and ill appearing male. Arrived in wheelchair.  HEENT: Normal. Neck:  Supple. JVP to jaw. Carotids 2+ bilat; no bruits. No thyromegaly or nodule noted.   Cor: PMI non-displaced. Regular rate and rhythm. 2/6 TR murmur.  Lungs: Clear in upper lobes bilaterally, diminished in bases.  Abdomen: Soft, non tender, non distended. no HSM. No bruits or masses.  Extremities: no cyanosis, clubbing, rash. Cool. 1+ pedal edema.   Neuro: Alert & oriented x 3. Cranial nerves grossly intact. Moves all 4 extremities w/o difficulty. Affect pleasant    ASSESSMENT & PLAN:  1. Chronic systolic HF: NICM, Echo 11/2015 EF 20-25%, TEE in 05/2016 EF 15%. S/p Medtronic ICD - NYHA IV - Volume status elevated on exam, confirmed by Optivol - Increase Bumex to 2mg BID.  - Will plan for RHC in 2 days to further assess hemodynamics.  - Spiro stopped in the past due to AKI.  - Increased Kcl supplementation.  - BMET today.  - No beta blocker with suspected low output.     2. CKD III -IV - Baseline creatinine 2.2-2.6 - BMET today.   3. NSVT and frequent PVCs:  - Will check K today.  - s/p ICD.   4. Carotid stenosis: - 70% LICA stenosis - Continue statin.  - Repeat carotid duplex in Dec. 2018.  - Can stop niacin.   5. CAD:  Mild non obstructive on cath 11/2015 - Not on ASA with stable CAD and on Eliquis.  - Continue atorvastatin 20 mg daily.    6. PAF:  - s/p TEE/DCCV 02/25/16.  - No further afib since DCCV as of 04/20/16.  - No Afib by device interrogation today.  - This patients CHA2DS2-VASc Score and unadjusted Ischemic Stroke Rate (% per year) is equal to 7.2 % stroke rate/year from a score of 5 Above score calculated as 1 point each if present [CHF, HTN, DM, Vascular=MI/PAD/Aortic Plaque, Age if 65-74, or Male], 2 points   each if present [Age > 75, or Stroke/TIA/TE] - Eliquis has been on hold since discharge on 10/14/16.  - Continue amio 200 mg daily.   7. Heme positive stool, blood loss anemia - Stat CBC and iron studies today.  - Likely needs feraheme.  - No melena    RHC on 11/11/16. Stat labs as above.   Arbutus Leas, NP  11/09/2016   Patient seen and examined with Jettie Booze, NP. We discussed all aspects of the encounter. I agree with the assessment and plan as stated above.   He feels very poorly.NYHA IV. Concern over recurrent anemia but CBC ok today.  Volume status mildly elevated on exam and by Optivoll. No VT/AF on device interrogation today.  Suspect low output HF. Will plan RHC this week.   Glori Bickers, MD  9:11 PM

## 2016-11-11 NOTE — Progress Notes (Signed)
Site area: rt ac venous sheath Site Prior to Removal:  Level 0 Pressure Applied For: 15 minutes Manual:   yes Patient Status During Pull:  stable Post Pull Site:  Level 0 Post Pull Instructions Given:  yes Post Pull Pulses Present: brachial and radial pulses palpable Dressing Applied:  Gauze and tegaderm Bedrest begins @  Comments:

## 2016-11-11 NOTE — Discharge Instructions (Signed)
venogram, Care After °This sheet gives you information about how to care for yourself after your procedure. Your health care provider may also give you more specific instructions. If you have problems or questions, contact your health care provider. °What can I expect after the procedure? °After the procedure, it is common to have bruising and tenderness at the catheter insertion area. °Follow these instructions at home: °Insertion site care °· Follow instructions from your health care provider about how to take care of your insertion site. Make sure you: °? Wash your hands with soap and water before you change your bandage (dressing). If soap and water are not available, use hand sanitizer. °? Change your dressing as told by your health care provider. °? Leave stitches (sutures), skin glue, or adhesive strips in place. These skin closures may need to stay in place for 2 weeks or longer. If adhesive strip edges start to loosen and curl up, you may trim the loose edges. Do not remove adhesive strips completely unless your health care provider tells you to do that. °· Do not take baths, swim, or use a hot tub until your health care provider approves. °· You may shower 24-48 hours after the procedure or as told by your health care provider. °? Gently wash the site with plain soap and water. °? Pat the area dry with a clean towel. °? Do not rub the site. This may cause bleeding. °· Do not apply powder or lotion to the site. Keep the site clean and dry. °· Check your insertion site every day for signs of infection. Check for: °? Redness, swelling, or pain. °? Fluid or blood. °? Warmth. °? Pus or a bad smell. °Activity °· Rest as told by your health care provider, usually for 1-2 days. °· Do not lift anything that is heavier than 10 lbs. (4.5 kg) or as told by your health care provider. °· Do not drive for 24 hours if you were given a medicine to help you relax (sedative). °· Do not drive or use heavy machinery while  taking prescription pain medicine. °General instructions °· Return to your normal activities as told by your health care provider, usually in about a week. Ask your health care provider what activities are safe for you. °· If the catheter site starts bleeding, lie flat and put pressure on the site. If the bleeding does not stop, get help right away. This is a medical emergency. °· Drink enough fluid to keep your urine clear or pale yellow. This helps flush the contrast dye from your body. °· Take over-the-counter and prescription medicines only as told by your health care provider. °· Keep all follow-up visits as told by your health care provider. This is important. °Contact a health care provider if: °· You have a fever or chills. °· You have redness, swelling, or pain around your insertion site. °· You have fluid or blood coming from your insertion site. °· The insertion site feels warm to the touch. °· You have pus or a bad smell coming from your insertion site. °· You have bruising around the insertion site. °· You notice blood collecting in the tissue around the catheter site (hematoma). The hematoma may be painful to the touch. °Get help right away if: °· You have severe pain at the catheter insertion area. °· The catheter insertion area swells very fast. °· The catheter insertion area is bleeding, and the bleeding does not stop when you hold steady pressure on the area. °·   The area near or just beyond the catheter insertion site becomes pale, cool, tingly, or numb. These symptoms may represent a serious problem that is an emergency. Do not wait to see if the symptoms will go away. Get medical help right away. Call your local emergency services (911 in the [place] Do not drive yourself to the hospital. Summary  After the procedure, it is common to have bruising and tenderness at the catheter insertion area.  After the procedure, it is important to rest and drink plenty of fluids.  Do not take baths,  swim, or use a hot tub until your health care provider says it is okay to do so. You may shower 24-48 hours after the procedure or as told by your health care provider.  If the catheter site starts bleeding, lie flat and put pressure on the site. If the bleeding does not stop, get help right away. This is a medical emergency. This information is not intended to replace advice given to you by your health care provider. Make sure you discuss any questions you have with your health care provider. Document Released: 11/12/2004 Document Revised: 03/31/2016 Document Reviewed: 03/31/2016 Elsevier Interactive Patient Education  2017 [employer].

## 2016-11-11 NOTE — Progress Notes (Signed)
Site area: rt groin fv sheath Site Prior to Removal:  Level 0 Pressure Applied For: 15 minutes Manual:   yes Patient Status During Pull:  stable Post Pull Site:  Level 0 Post Pull Instructions Given:  yes Post Pull Pulses Present: palpable Dressing Applied:  Gauze and tegaderm Bedrest begins @ 1445 Comments:

## 2016-11-11 NOTE — Progress Notes (Signed)
EPIC Encounter for ICM Monitoring  Patient Name: [facility] is a 83 y.o. male Date: 11/11/2016 Primary Care Physican: [name], MD Primary Cardiologist:Berry/Bensimhon Electrophysiologist: Allred Dry Weight:Last weight 151lbs  Bi-V Pacing:  99.1%      No call to patient today since he is having catheterization by Dr Gala Romney.    Thoracic impedance continues to be abnormal suggesting fluid accumulation.  Prescribed dosage: Bumetanide 2 mg 2 tablets twice a day.  Potassium 20 mEq 2 tablets daily.    Recommendations: None  Follow-up plan: ICM clinic phone appointment on 11/22/2016 since patient has office appointment scheduled 11/16/2016 with HF clinic.  Copy of ICM check sent to Dr Gala Romney and Dr Johney Frame.   3 month ICM trend: 11/11/2016   1 Year ICM trend:      Karie Soda, RN 11/11/2016 11:46 AM

## 2016-11-12 ENCOUNTER — Encounter (HOSPITAL_COMMUNITY): Payer: Self-pay | Admitting: Internal Medicine

## 2016-11-16 ENCOUNTER — Ambulatory Visit (HOSPITAL_COMMUNITY)
Admission: RE | Admit: 2016-11-16 | Discharge: 2016-11-16 | Disposition: A | Discharge status: Home / Self Care | Payer: Medicare Other | Source: Ambulatory Visit | Attending: Internal Medicine | Admitting: Internal Medicine

## 2016-11-16 VITALS — BP 90/46 | HR 74 | Wt 151.4 lb

## 2016-11-16 DIAGNOSIS — I6522 Occlusion and stenosis of left carotid artery: Secondary | ICD-10-CM | POA: Diagnosis not present

## 2016-11-16 DIAGNOSIS — N184 Chronic kidney disease, stage 4 (severe): Secondary | ICD-10-CM | POA: Diagnosis not present

## 2016-11-16 DIAGNOSIS — I493 Ventricular premature depolarization: Secondary | ICD-10-CM | POA: Insufficient documentation

## 2016-11-16 DIAGNOSIS — Z87891 Personal history of nicotine dependence: Secondary | ICD-10-CM | POA: Insufficient documentation

## 2016-11-16 DIAGNOSIS — I2510 Atherosclerotic heart disease of native coronary artery without angina pectoris: Secondary | ICD-10-CM | POA: Diagnosis not present

## 2016-11-16 DIAGNOSIS — D500 Iron deficiency anemia secondary to blood loss (chronic): Secondary | ICD-10-CM | POA: Diagnosis not present

## 2016-11-16 DIAGNOSIS — I480 Paroxysmal atrial fibrillation: Secondary | ICD-10-CM | POA: Insufficient documentation

## 2016-11-16 DIAGNOSIS — G4733 Obstructive sleep apnea (adult) (pediatric): Secondary | ICD-10-CM

## 2016-11-16 DIAGNOSIS — I4891 Unspecified atrial fibrillation: Secondary | ICD-10-CM

## 2016-11-16 DIAGNOSIS — I10 Essential (primary) hypertension: Secondary | ICD-10-CM | POA: Diagnosis not present

## 2016-11-16 DIAGNOSIS — Z880 Allergy status to penicillin: Secondary | ICD-10-CM | POA: Insufficient documentation

## 2016-11-16 DIAGNOSIS — I779 Disorder of arteries and arterioles, unspecified: Secondary | ICD-10-CM | POA: Diagnosis not present

## 2016-11-16 DIAGNOSIS — E785 Hyperlipidemia, unspecified: Secondary | ICD-10-CM | POA: Insufficient documentation

## 2016-11-16 DIAGNOSIS — R195 Other fecal abnormalities: Secondary | ICD-10-CM | POA: Diagnosis not present

## 2016-11-16 DIAGNOSIS — Z95810 Presence of automatic (implantable) cardiac defibrillator: Secondary | ICD-10-CM | POA: Diagnosis not present

## 2016-11-16 DIAGNOSIS — Z8673 Personal history of transient ischemic attack (TIA), and cerebral infarction without residual deficits: Secondary | ICD-10-CM | POA: Diagnosis not present

## 2016-11-16 DIAGNOSIS — I428 Other cardiomyopathies: Secondary | ICD-10-CM | POA: Insufficient documentation

## 2016-11-16 DIAGNOSIS — I739 Peripheral vascular disease, unspecified: Secondary | ICD-10-CM | POA: Insufficient documentation

## 2016-11-16 DIAGNOSIS — Z79899 Other long term (current) drug therapy: Secondary | ICD-10-CM | POA: Insufficient documentation

## 2016-11-16 DIAGNOSIS — I5022 Chronic systolic (congestive) heart failure: Secondary | ICD-10-CM

## 2016-11-16 DIAGNOSIS — I447 Left bundle-branch block, unspecified: Secondary | ICD-10-CM | POA: Insufficient documentation

## 2016-11-16 DIAGNOSIS — R7303 Prediabetes: Secondary | ICD-10-CM | POA: Insufficient documentation

## 2016-11-16 DIAGNOSIS — I2583 Coronary atherosclerosis due to lipid rich plaque: Secondary | ICD-10-CM | POA: Diagnosis not present

## 2016-11-16 DIAGNOSIS — I130 Hypertensive heart and chronic kidney disease with heart failure and stage 1 through stage 4 chronic kidney disease, or unspecified chronic kidney disease: Secondary | ICD-10-CM | POA: Insufficient documentation

## 2016-11-16 DIAGNOSIS — Z91013 Allergy to seafood: Secondary | ICD-10-CM | POA: Diagnosis not present

## 2016-11-16 DIAGNOSIS — I472 Ventricular tachycardia: Secondary | ICD-10-CM | POA: Insufficient documentation

## 2016-11-16 NOTE — Progress Notes (Signed)
Patient ID: Scott Parsons, male   DOB: 1929/12/10, 83 y.o.   MRN: [id number]    Advanced Heart Failure Clinic Note    Primary Care: [name], MD Primary Cardiologist: Dr Gwenlyn Found Primary HF: Dr. [name]   HPI: Scott Parsons is a 83 y.o. male with history of NICM, Chronic systolic CHF (Echo 01/5283 EF 20-25%), LBBB s/p biV ICD, CKD III-IV, HTN, PAD, Carotid stenosis, and RAS s/p renal stenting.   He was previously followed closely by Carolynn Serve at [facility]. In 09/2015 was found to have worsening of renal fxn along with hyperkalemia. He was admitted to [facility] and his lisinopril, valsartan, and spironolactone were discontinued. Labs were stable upon f/u and he was subsequently placed on entresto.  Was taken to [facility] ED on 11/11/15 with acute worsening dyspnea. Required Bipap. Tx to [facility] for further eval. Was admitted by CCM for resp failure. Had significant symptom improvement with diuresis. Catheterization as below with mild non-obstructive CAD and well compensated hemodynamics after diuresis. Diuresed 2.2 L. Weight 190 on discharge.   Admitted 8/26-8/30 with flash pulmonary edema. Diuresed with IV lasix. ICD adjusted to increase pacing. Out 10 lbs with IV lasix. Discharge weight was 180 lbs.   Admitted 1/6 through 05/21/16 with respiratory failure and volume overload. Required short term intubation. Treated for pneumonia. Placed on dobutamine and levophed. Discharged on lasix 40 mg daily. Discharge weight 181 pounds.   Admitted 09/23/16-10/14/16 at [facility] for CHF exacerbation. Also with anemia, heme + stool. Endoscopy did not show any source for bleeding. Required 2 units PRBC's. His Eliquis and ASA were held at discharge. Discharge weight was 155 pounds, Hgb was 8.7 at discharge.   He presents today for regular follow up. Last visit felt horrible so set up for RHC as below. Bumex cut back. CO normal. Weight down 2 more lbs from last visit. Feels much better. Appetite better.  Weight at home 146. Pressures in 90-100s at home. Denies lightheadedness or dizziness. Mild orthopnea at times. Denies chest pain. Wears 02 at night. Wakes up SOB at times. Previously snored a lot per wife, but hard for her to hear now over 02.  He feels much better over the past 2 weeks.   Optivol: Fluid index maxed out since April, with mild downtrend in Jun.  Thoracic impedence below threshold but trending up. Pt activity 15 minutes.   Labs  05/21/2016: K 3.6 Creatinine 1.56   RHC 11/11/16 RA = 1 RV = 43/3 PA = 32/10 (18) PCW = 7 Fick cardiac output/index = 6.6/3.5 PVR = 1.7 WU  FA sat = 93% PA sat = 62%, 64%    R/LHC 11/13/15  Mild non-obstructive CAD, and well compensated hemodynamics after diuresis.  RA = 3 RV = 36/3 PA = 34/12 (20) PCW = 12 Ao = 118/48 (72) LV = 129/18 Fick cardiac output/index = 5.6/2.6 PVR = 1.4 WU SVR = 984 FA sat = 87% PA sat = 58%, 55%   TEE 05/20/16 LVEF 15%, no evidence of endocarditis.   Echo 11/13/15  LVEF 20-25% with diffuse hypokinesis. Mild AS, Moderate MR, Severe LAE   Past Medical History:  Diagnosis Date  . Carotid artery disease ([place])    a. 04/2015 mild to mod bilat ICA stenosis;  b. 05/3242 >01% LICA stenosis, no significant dzs on right.  . Chronic combined systolic and diastolic CHF, NYHA class 4 ([place])    a. 01/2013 Echo: EF 25-30%; b. 08/2013 Echo: EF 15-20%; c. 04/2014 Echo: EF  20-25%, mild LVH, Gr1 DD, mild MR, mod dil LA.  . CKD (chronic kidney disease), stage IV ([place])    a. baseline creat of ~ 1.8;  b. 09/2015 Admitted to [facility] with AKI and hyperkalemia.  [name] History of stroke   . Hyperlipidemia   . Hypertensive heart disease with CHF (congestive heart failure) ([place])   . ICD (implantable cardioverter-defibrillator), dual, in situ    a. 09/2013 s/p MDT Hillery Aldo XT CRT-D (ser #[id number]).  . LBBB (left bundle branch block)   . Non-obstructive CAD    a. 11/2001 Cath [facility]): LM nl, LAD 67m LCX nondom, nl, RI large, nl, RCA  dominant, 469m.  . Marland Kitchenonischemic cardiomyopathy ([place]   a. 11/2001 nonobs cath; b. 09/2013 s/p MDT ViHillery AldoT CRT-D (ser #B#[id number]); c. 04/2014 Echo: EF 20-25%;  d. Followed @ [facility] by K. AdAndree ElkMD.  . OSA (obstructive sleep apnea)    a. Does not tolerate/use CPAP.  . Pre-diabetes   . PVD (peripheral vascular disease) ([place]   a. 10/2005 PTA: right external iliac artery angioplasty and left renal artery angioplasty and stenting;  b. 10/2015 ABI's in setting of recurrent claudication: R 0.97, L 0.77 (reduced by 0.12 from prior study).  . Renal artery stenosis ([place]   a. 10/2005 s/p L renal artery stenting.  . Marland Kitchenespiratory failure requiring intubation (HCLake [place]01/10/2016   by [facility], extubated after arrival to [place]  . TIA (transient ischemic attack) 06/2010    Current Outpatient Prescriptions  Medication Sig Dispense Refill  . allopurinol (ZYLOPRIM) 100 MG tablet Take 100 mg by mouth daily.    . Marland Kitchenmiodarone (PACERONE) 200 MG tablet Take 200 mg by mouth 2 (two) times daily.    . Marland Kitchentorvastatin (LIPITOR) 20 MG tablet Take 20 mg by mouth every evening.     . Blood Glucose Monitoring Suppl (FIFTY50 GLUCOSE METER 2.0) w/Device KIT     . bumetanide (BUMEX) 1 MG tablet Take 1 mg by mouth 2 (two) times daily.    . ferrous sulfate 325 (65 FE) MG tablet Take 325 mg by mouth daily with breakfast.    . ipratropium (ATROVENT) 0.02 % nebulizer solution Inhale 2.5 mL (500 mcg total) by nebulization every six (6) hours as needed.    . Marland Kitchenpratropium-albuterol (DUONEB) 0.5-2.5 (3) MG/3ML SOLN Inhale 3 mL by nebulization every eight (8) hours as needed.    . mirtazapine (REMERON) 30 MG tablet Take 30 mg by mouth at bedtime.     . OXYGEN Inhale 4 L into the lungs at bedtime.     . pantoprazole (PROTONIX) 20 MG tablet Take 20 mg by mouth daily.    . potassium chloride SA (K-DUR,KLOR-CON) 20 MEQ tablet Take 20 mEq by mouth daily.    . vitamin C (ASCORBIC ACID) 500 MG tablet Take 500 mg by mouth daily.    .  nitroGLYCERIN (NITROSTAT) 0.4 MG SL tablet Place 0.4 mg under the tongue every 5 (five) minutes as needed for chest pain (maximum 3 tablets).     No current facility-administered medications for this encounter.     Allergies  Allergen Reactions  . Amoxicillin Itching       . Biaxin [Clarithromycin] Itching and Swelling    Lip swelling     . Erythromycin Itching  . Penicillins Hives and Itching    Has patient had a PCN reaction causing immediate rash, facial/tongue/throat swelling, SOB or lightheadedness with hypotension: Yes Has patient had a PCN reaction causing  severe rash involving mucus membranes or skin necrosis: No Has patient had a PCN reaction that required hospitalization No Has patient had a PCN reaction occurring within the last 10 years: No If all of the above answers are "NO", then may proceed with Cephalosporin use.  . Shellfish Allergy Itching        . Sulfa Antibiotics Other (See Comments)    Hyperkalemia  AKI and hyperkalemia      Social History   Social History  . Marital status: Married    Spouse name: N/A  . Number of children: N/A  . Years of education: N/A   Occupational History  . Retired    Social History Main Topics  . Smoking status: Former Smoker    Years: 52.00    Types: Cigarettes    Quit date: 08/11/2004  . Smokeless tobacco: Never Used  . Alcohol use No  . Drug use: No  . Sexual activity: Yes    Partners: Female   Other Topics Concern  . Not on file   Social History Narrative   Retired.  Lives in [place] with his wife.  Tries to remain active.      Family History  Problem Relation Age of Onset  . Alzheimer's disease Mother   . Heart disease Father   . Heart disease Paternal Grandfather   . Heart attack Paternal Grandfather     Vitals:   11/16/16 1534  BP: (!) 90/46  Pulse: 74  SpO2: 98%  Weight: 151 lb 6.4 oz (68.7 kg)    Wt Readings from Last 3 Encounters:  11/16/16 151 lb 6.4 oz (68.7 kg)  11/11/16 147 lb  (66.7 kg)  11/08/16 153 lb 6.4 oz (69.6 kg)     PHYSICAL EXAM:  General: Elderly and fatigued appearing. [facility]. Color improved.  HEENT: Normal Neck: Supple. JVP 8-9 cm. Carotids 2+ bilat; no bruits. No thyromegaly or nodule noted. Cor: PMI nondisplaced. RRR, 2/6 TR murmur Lungs: CTAB, Diminished basilar sounds. Abdomen: Soft, non-tender, non-distended, no HSM. No bruits or masses. +BS  Extremities: No cyanosis, clubbing, or rash. Trace ankle edema. Cool to the touch.   Neuro: Alert & orientedx3, cranial nerves grossly intact. moves all 4 extremities w/o difficulty. Affect pleasant   ASSESSMENT & PLAN:  1. Chronic systolic HF: NICM, Echo 11/1060 EF 20-25%, TEE in 05/2016 EF 15%. S/p Medtronic ICD - NYHA IIIb - Volume status elevated by Optivol, but depleted by recent cath.  Looks OK on exam. - Recent RHC with normal output and low filling pressures - Continue bumex 1 mg BID. Can hold as needed.  Arlyce Harman stopped in the past due to AKI.  - No beta blocker with suspected low output.     2. CKD III -IV - Baseline creatinine 2.2-2.6. Recent labs stable.   3. NSVT and frequent PVCs:  - Recent labs stable.   - s/p ICD.   4. Carotid stenosis: - 69% LICA stenosis - Continue statin.  - Repeat carotid duplex in Dec. 2018.  - Can stop niacin.   5. CAD:  Mild non obstructive on cath 11/2015 - Not on ASA with stable CAD and on Eliquis.  - Continue atorvastatin 20 mg daily.    6. PAF:  - s/p TEE/DCCV 02/25/16.  - No further afib since DCCV as of 04/20/16.  - No afib by device interrogation today.  - CHA2DS2-VASc Score 5.  - Eliquis has been on hold since discharge on 10/14/16.  - Continue amio  200 mg daily.   7. Heme positive stool, blood loss anemia - Recent CBC stable.  - No bleeding.   8. OSA - Not wearing his CPAP as often. - Encouraged nightly use.   Feeling better. No change to meds with soft pressures.   Very complicated picture. Prognosis concerning overall.  Keep close 3-4 week follow up.   Shirley Friar, PA-C  11/16/2016

## 2016-11-16 NOTE — Patient Instructions (Signed)
No changes to medication at this time.  No lab work today.  Follow up 3-4 weeks.  Take all medication as prescribed the day of your appointment. Bring all medications with you to your appointment.  Do the following things EVERYDAY: 1) Weigh yourself in the morning before breakfast. Write it down and keep it in a log. 2) Take your medicines as prescribed 3) Eat low salt foods-Limit salt (sodium) to 2000 mg per day.  4) Stay as active as you can everyday 5) Limit all fluids for the day to less than 2 liters

## 2016-11-16 NOTE — Progress Notes (Signed)
Advanced Heart Failure Medication Review by a Pharmacist  Does the patient  feel that his/her medications are working for him/her?  yes  Has the patient been experiencing any side effects to the medications prescribed?  no  Does the patient measure his/her own blood pressure or blood glucose at home?  yes   Does the patient have any problems obtaining medications due to transportation or finances?   no  Understanding of regimen: good Understanding of indications: good Potential of compliance: good Patient understands to avoid NSAIDs. Patient understands to avoid decongestants.  Issues to address at subsequent visits: None   Pharmacist comments: Scott Parsons is a pleasant 83 yo M presenting with a friend and a current medication list. He reports good compliance with his regimen and did not have any specific medication-related questions or concerns for me at this time.   [name] Bonnye Fava, PharmD, BCPS, CPP Clinical Pharmacist Pager: [phone] Phone: [phone] 11/16/2016 3:45 PM      Time with patient: 10 minutes Preparation and documentation time: 2 minutes Total time: 12 minutes

## 2016-11-22 ENCOUNTER — Ambulatory Visit (INDEPENDENT_AMBULATORY_CARE_PROVIDER_SITE_OTHER): Payer: Medicare Other

## 2016-11-22 DIAGNOSIS — Z95810 Presence of automatic (implantable) cardiac defibrillator: Secondary | ICD-10-CM | POA: Diagnosis not present

## 2016-11-22 DIAGNOSIS — I5022 Chronic systolic (congestive) heart failure: Secondary | ICD-10-CM | POA: Diagnosis not present

## 2016-11-22 NOTE — Progress Notes (Signed)
EPIC Encounter for ICM Monitoring  Patient Name: Scott Parsons is a 83 y.o. male Date: 11/22/2016 Primary Care Physican: [name], MD Primary Cardiologist:Berry/Bensimhon Electrophysiologist: Allred Dry Weight:146.7lbs  Bi-V Pacing: 99.1%           Heart Failure questions reviewed, pt asymptomatic.  Denied any fluid symptoms   Thoracic impedance abnormal suggesting fluid accumulation.  Prescribed dosage: Bumetanide 1 mg 1 tablet twice a day. Potassium 20 mEq 1 tablet daily.   Labs:  11/16/2016 Creatinine 2.23, BUN 35, Potassium 4.1, Sodium 136, Care Everywhere results 11/11/2016 Creatinine 2.29, BUN 38, Potassium 4.0, Sodium 136, EGFR 25-28 11/08/2016 Creatinine 2.23, BUN 37, Potassium 3.8, Sodium 136, EGFR 19-23 08/25/2016 Creatinine 1.90, BUN 44, Potassium 3.6, Sodium 135, EGFR 34-41 Care Everywhere 08/12/2016 Creatinine 2.78, BUN 37, Potassium 3.9. Sodium 136, EGFR 19-23 08/09/2016 Creatinine 2.62, BUN 37, Potassium 3.9, Sodium 138, EGFR 21-24 07/05/2016 Creatinine 2.30, BUN 29, Potassium 3.8, Sodium 139, EGFR 24-28 06/17/2016 Creatinine 2.21, BUN 32, Potassium 4.0, Sodium 137, EGFR 26-30 06/10/2016 Creatinine 2.37, BUN 35, Potassium 4.2, Sodium 135, EGFR 24-27 01/12/2018Creatinine 1.56, BUN 30, Potassium 3.6, Sodium 139, EGFR 39-45  01/11/2018Creatinine 1.51, BUN 32, Potassium 3.9, Sodium 140, EGFR 41-47  01/10/2018Creatinine 1.47, BUN 34, Potassium 3.6, Sodium 140, EGFR 42-49  05/18/2016 Creatinine 1.90, BUN 46, Potassium 3.4, Sodium 143, EGFR 31-36  01/08/2018Creatinine 2.49, BUN 55, Potassium 3.4, Sodium 141, EGFR 22-26  01/07/2018Creatinine 2.79, BUN 50, Potassium 3.6, Sodium 138, EGFR 19-22  01/06/2018Creatinine 2.86, BUN 44, Potassium 4.8, Sodium 134, EGFR 19-22  04/28/2016 Creatinine 2.46, BUN 45, Potassium 4.1, Sodium 139, EGFR 23-26  04/20/2016 Creatinine 2.31, BUN 43, Potassium 4.0, Sodium 138, EGFR 25-28  Recommendations:  Copy of ICM check  sent to Dr [name] and Dr [name] for review and if any recommendations will call back.    Follow-up plan: ICM clinic phone appointment on 12/07/2016.  Office appointment scheduled 12/14/2016 with HF clinic.  3 month ICM trend: 11/22/2016   1 Year ICM trend:      Rosalene Billings, RN 11/22/2016 2:12 PM

## 2016-11-28 ENCOUNTER — Other Ambulatory Visit (HOSPITAL_COMMUNITY): Payer: Self-pay | Admitting: Internal Medicine

## 2016-12-03 ENCOUNTER — Other Ambulatory Visit (HOSPITAL_COMMUNITY): Payer: Self-pay | Admitting: *Deleted

## 2016-12-03 MED ORDER — BUMETANIDE 1 MG PO TABS: 1.0000 mg | ORAL_TABLET | Freq: Two times a day (BID) | ORAL | 3 refills | Status: DC

## 2016-12-03 MED ORDER — POTASSIUM CHLORIDE CRYS ER 20 MEQ PO TBCR: 20.0000 meq | EXTENDED_RELEASE_TABLET | Freq: Every day | ORAL | 3 refills | Status: DC

## 2016-12-07 ENCOUNTER — Other Ambulatory Visit: Payer: Self-pay | Admitting: Cardiology

## 2016-12-07 ENCOUNTER — Ambulatory Visit (INDEPENDENT_AMBULATORY_CARE_PROVIDER_SITE_OTHER): Payer: Medicare Other

## 2016-12-07 ENCOUNTER — Ambulatory Visit (INDEPENDENT_AMBULATORY_CARE_PROVIDER_SITE_OTHER): Payer: Medicare Other | Admitting: *Deleted

## 2016-12-07 DIAGNOSIS — I428 Other cardiomyopathies: Secondary | ICD-10-CM

## 2016-12-07 DIAGNOSIS — Z95810 Presence of automatic (implantable) cardiac defibrillator: Secondary | ICD-10-CM

## 2016-12-07 DIAGNOSIS — I5022 Chronic systolic (congestive) heart failure: Secondary | ICD-10-CM | POA: Diagnosis not present

## 2016-12-07 NOTE — Progress Notes (Signed)
Remote ICD transmission.   

## 2016-12-07 NOTE — Progress Notes (Signed)
EPIC Encounter for ICM Monitoring  Patient Name: Scott Parsons is a 83 y.o. male Date: 12/07/2016 Primary Care Physican: [name], MD Primary Cardiologist:Berry/Bensimhon Electrophysiologist: Allred Dry Weight:150lbs  Bi-V Pacing: 98.1%      Heart Failure questions reviewed, pt reported gaining 10 lbs in last month but none in last week and swelling in feet in last 2 weeks.  Patient's weight gain has been addressed by [facility] cardiologist on 12/02/2016 and extra bumex was prescribed.    Thoracic impedance abnormal suggesting fluid accumulation.  Prescribed dosage: Bumetanide 1 mg - Take 1 tab in the morning and 2 tabs in the afternoon at least 6 hours before you usual bedtime (increased by [facility] 7/26). Potassium 20 mEq 1 tabletdaily.   Labs:  12/02/2016 Creatinine 2.40, BUN 44, Potassium 4.9, Sodium 139  Care Everywhere results 11/16/2016 Creatinine 2.23, BUN 35, Potassium 4.1, Sodium 136, Care Everywhere results 11/11/2016 Creatinine 2.29, BUN 38, Potassium 4.0, Sodium 136, EGFR 25-28 11/08/2016 Creatinine 2.23, BUN 37, Potassium 3.8, Sodium 136, EGFR 19-23 08/25/2016 Creatinine 1.90, BUN 44, Potassium 3.6, Sodium 135, EGFR 34-41 Care Everywhere 08/12/2016 Creatinine 2.78, BUN 37, Potassium 3.9. Sodium 136, EGFR 19-23 08/09/2016 Creatinine 2.62, BUN 37, Potassium 3.9, Sodium 138, EGFR 21-24 07/05/2016 Creatinine 2.30, BUN 29, Potassium 3.8, Sodium 139, EGFR 24-28 06/17/2016 Creatinine 2.21, BUN 32, Potassium 4.0, Sodium 137, EGFR 26-30 06/10/2016 Creatinine 2.37, BUN 35, Potassium 4.2, Sodium 135, EGFR 24-27 01/12/2018Creatinine 1.56, BUN 30, Potassium 3.6, Sodium 139, EGFR 39-45  01/11/2018Creatinine 1.51, BUN 32, Potassium 3.9, Sodium 140, EGFR 41-47  01/10/2018Creatinine 1.47, BUN 34, Potassium 3.6, Sodium 140, EGFR 42-49  05/18/2016 Creatinine 1.90, BUN 46, Potassium 3.4, Sodium 143, EGFR 31-36  01/08/2018Creatinine 2.49, BUN 55, Potassium 3.4,  Sodium 141, EGFR 22-26  01/07/2018Creatinine 2.79, BUN 50, Potassium 3.6, Sodium 138, EGFR 19-22  01/06/2018Creatinine 2.86, BUN 44, Potassium 4.8, Sodium 134, EGFR 19-22  04/28/2016 Creatinine 2.46, BUN 45, Potassium 4.1, Sodium 139, EGFR 23-26  04/20/2016 Creatinine 2.31, BUN 43, Potassium 4.0, Sodium 138, EGFR 25-28  Recommendations:   Fluid accumulation and symptoms have been addressed by [facility] on 7/26.    Follow-up plan: ICM clinic phone appointment on 12/13/2016 to recheck fluid levels.  Office appointment scheduled 12/14/2016 with Heart Failure Clinic.  Copy of ICM check sent to primary cardiologist and device physician.   3 month ICM trend: 12/06/2016   1 Year ICM trend:      Rosalene Billings, RN 12/07/2016 9:01 AM

## 2016-12-09 ENCOUNTER — Encounter: Payer: Self-pay | Admitting: Cardiology

## 2016-12-10 LAB — CUP PACEART REMOTE DEVICE CHECK
Brady Statistic AP VP Percent: 88.72 %
Brady Statistic AS VP Percent: 10.85 %
Brady Statistic RA Percent Paced: 87.14 %
HIGH POWER IMPEDANCE MEASURED VALUE: 48 Ohm
Implantable Lead Implant Date: 20150507
Implantable Lead Location: 753858
Implantable Lead Model: 5076
Implantable Lead Model: 6935
Implantable Pulse Generator Implant Date: 20150507
Lead Channel Impedance Value: 285 Ohm
Lead Channel Impedance Value: 285 Ohm
Lead Channel Impedance Value: 285 Ohm
Lead Channel Impedance Value: 342 Ohm
Lead Channel Impedance Value: 418 Ohm
Lead Channel Impedance Value: 494 Ohm
Lead Channel Pacing Threshold Amplitude: 0.75 V
Lead Channel Sensing Intrinsic Amplitude: 1.625 mV
Lead Channel Sensing Intrinsic Amplitude: 12.375 mV
Lead Channel Sensing Intrinsic Amplitude: 12.375 mV
Lead Channel Setting Pacing Amplitude: 3 V
Lead Channel Setting Pacing Pulse Width: 0.4 ms
Lead Channel Setting Pacing Pulse Width: 0.4 ms
MDC IDC LEAD IMPLANT DT: 20150507
MDC IDC LEAD IMPLANT DT: 20150507
MDC IDC LEAD LOCATION: 753859
MDC IDC LEAD LOCATION: 753860
MDC IDC MSMT BATTERY REMAINING LONGEVITY: 40 mo
MDC IDC MSMT BATTERY VOLTAGE: 2.96 V
MDC IDC MSMT LEADCHNL LV IMPEDANCE VALUE: 342 Ohm
MDC IDC MSMT LEADCHNL LV IMPEDANCE VALUE: 361 Ohm
MDC IDC MSMT LEADCHNL LV IMPEDANCE VALUE: 475 Ohm
MDC IDC MSMT LEADCHNL LV IMPEDANCE VALUE: 475 Ohm
MDC IDC MSMT LEADCHNL LV IMPEDANCE VALUE: 494 Ohm
MDC IDC MSMT LEADCHNL LV IMPEDANCE VALUE: 494 Ohm
MDC IDC MSMT LEADCHNL LV PACING THRESHOLD AMPLITUDE: 1.875 V
MDC IDC MSMT LEADCHNL LV PACING THRESHOLD PULSEWIDTH: 0.4 ms
MDC IDC MSMT LEADCHNL RA IMPEDANCE VALUE: 418 Ohm
MDC IDC MSMT LEADCHNL RA PACING THRESHOLD AMPLITUDE: 0.625 V
MDC IDC MSMT LEADCHNL RA PACING THRESHOLD PULSEWIDTH: 0.4 ms
MDC IDC MSMT LEADCHNL RA SENSING INTR AMPL: 1.625 mV
MDC IDC MSMT LEADCHNL RV PACING THRESHOLD PULSEWIDTH: 0.4 ms
MDC IDC SESS DTM: 20180731092603
MDC IDC SET LEADCHNL RA PACING AMPLITUDE: 2 V
MDC IDC SET LEADCHNL RV PACING AMPLITUDE: 2.5 V
MDC IDC SET LEADCHNL RV SENSING SENSITIVITY: 0.3 mV
MDC IDC STAT BRADY AP VS PERCENT: 0.18 %
MDC IDC STAT BRADY AS VS PERCENT: 0.25 %
MDC IDC STAT BRADY RV PERCENT PACED: 98.13 %

## 2016-12-13 ENCOUNTER — Ambulatory Visit (INDEPENDENT_AMBULATORY_CARE_PROVIDER_SITE_OTHER): Payer: Self-pay

## 2016-12-13 DIAGNOSIS — Z95810 Presence of automatic (implantable) cardiac defibrillator: Secondary | ICD-10-CM

## 2016-12-13 DIAGNOSIS — I5022 Chronic systolic (congestive) heart failure: Secondary | ICD-10-CM

## 2016-12-13 NOTE — Progress Notes (Signed)
EPIC Encounter for ICM Monitoring  Patient Name: Scott Parsons is a 83 y.o. male Date: 12/13/2016 Primary Care Physican: Hoffman, Byron, MD Primary Cardiologist:Berry/Bensimhon Electrophysiologist: Allred Dry Weight:150.7lbs  Bi-V Pacing: 96.9%         Heart Failure questions reviewed, pt symptomatic with swelling of feet.   Thoracic impedance abnormal suggesting fluid accumulation  Prescribed dosage: Bumetanide 1mg - Take 1 tab in the morning and 2 tabs in the afternoon at least 6 hours before you usual bedtime (increased by [facility] 7/26). Potassium 20 mEq 1tabletdaily.   Labs:  12/02/2016 Creatinine 2.40, BUN 44, Potassium 4.9, Sodium 139  Care Everywhere results 11/16/2016 Creatinine 2.23, BUN 35, Potassium 4.1, Sodium 136, Care Everywhere results 11/11/2016 Creatinine 2.29, BUN 38, Potassium 4.0, Sodium 136, EGFR 25-28 11/08/2016 Creatinine 2.23, BUN 37, Potassium 3.8, Sodium 136, EGFR 19-23 08/25/2016 Creatinine 1.90, BUN 44, Potassium 3.6, Sodium 135, EGFR 34-41 Care Everywhere 08/12/2016 Creatinine 2.78, BUN 37, Potassium 3.9. Sodium 136, EGFR 19-23 08/09/2016 Creatinine 2.62, BUN 37, Potassium 3.9, Sodium 138, EGFR 21-24 07/05/2016 Creatinine 2.30, BUN 29, Potassium 3.8, Sodium 139, EGFR 24-28 06/17/2016 Creatinine 2.21, BUN 32, Potassium 4.0, Sodium 137, EGFR 26-30 06/10/2016 Creatinine 2.37, BUN 35, Potassium 4.2, Sodium 135, EGFR 24-27 01/12/2018Creatinine 1.56, BUN 30, Potassium 3.6, Sodium 139, EGFR 39-45  01/11/2018Creatinine 1.51, BUN 32, Potassium 3.9, Sodium 140, EGFR 41-47  01/10/2018Creatinine 1.47, BUN 34, Potassium 3.6, Sodium 140, EGFR 42-49  05/18/2016 Creatinine 1.90, BUN 46, Potassium 3.4, Sodium 143, EGFR 31-36  01/08/2018Creatinine 2.49, BUN 55, Potassium 3.4, Sodium 141, EGFR 22-26  01/07/2018Creatinine 2.79, BUN 50, Potassium 3.6, Sodium 138, EGFR 19-22  01/06/2018Creatinine 2.86, BUN 44, Potassium 4.8, Sodium 134, EGFR  19-22  04/28/2016 Creatinine 2.46, BUN 45, Potassium 4.1, Sodium 139, EGFR 23-26  04/20/2016 Creatinine 2.31, BUN 43, Potassium 4.0, Sodium 138, EGFR 25-28  Recommendations:   Patient has HF appointment tomorrow and any recommendations will be given at that time.  Advised to take all medication bottles to appointment.  Follow-up plan: ICM clinic phone appointment on 12/27/2016 to recheck fluid levels.  Office appointment scheduled 12/14/2016 with Heart Failure Clinic and Renee Ursuy, PA.  Copy of ICM check sent to primary cardiologist and device physician.   3 month ICM trend: 12/13/2016   1 Year ICM trend:      Laurie S Short, RN 12/13/2016 11:13 AM     

## 2016-12-13 NOTE — Progress Notes (Addendum)
Cardiology Office Note Date:  12/14/2016  Patient ID:  Scott, Parsons 1930-04-13, MRN [id number] PCP:  [name], MD  Cardiologist  Dr. Gwenlyn Found CHF: Dr. [name] Electrophysiologist: Dr. [name]   Chief Complaint: routine device visit, BP check  History of Present Illness: Scott Parsons is a 83 y.o. male with history of PVD carotid disease monitored by Dr. Gwenlyn Found and RAS with stent on left on 2007 and b/l iliac artery stents, NOD coronaries by cath in 2003, HTN, HLD (followed by his PMD), CRI stage IV also managed with his PMD, TIA, LBBB and NICM with CRT-D, AFib.  He comes today seen for Dr. [name], last seen by him Dec 2017, he had had a hospitalization with CHF/COPD/CAP exacerbation requiring intubation in Jan, during that stay he had staph epidermis bacteremia, discharge summary reports discussed with ID completed Abx course with negative TEE for endocarditis and repeat BC during his stay were negative.  He has not had symptoms of illness, fever since, only CHF exacerbations.  Follows with CHF team, as well as [place] cardiology team.  Most recently seems like he was seen by them l;ate July, and sees our CHF clinic this afternoon.  He is off BB 2/2 low output HF, no ACE with CKD.  He had a hospital stay in [facility] about 5 weeks ago and was off his Eliquis since then 2/2 anemia requiring transfusions.    He reports his home weights since discharge +/- 4 weeks has wavered less then a pound up.down.  He denies any CP, palpitations, feels like his breathing/DOE is at about his baseline, no near syncope or syncope.  Generally feels weak.     Device information:  MDT CRT-D, implanted 09/13/13, Dr. [name], CM  AFib Hx Dx Sep p2017 TEE/DCCV 02/25/16 DCCV 04/20/16 amiodarone  Past Medical History:  Diagnosis Date  . Carotid artery disease ([place])    a. 04/2015 mild to mod bilat ICA stenosis;  b. 01/5637 >75% LICA stenosis, no significant dzs on right.  . Chronic combined  systolic and diastolic CHF, NYHA class 4 ([place])    a. 01/2013 Echo: EF 25-30%; b. 08/2013 Echo: EF 15-20%; c. 04/2014 Echo: EF 20-25%, mild LVH, Gr1 DD, mild MR, mod dil LA.  . CKD (chronic kidney disease), stage IV ([place])    a. baseline creat of ~ 1.8;  b. 09/2015 Admitted to [facility] with AKI and hyperkalemia.  [name] History of stroke   . Hyperlipidemia   . Hypertensive heart disease with CHF (congestive heart failure) ([place])   . ICD (implantable cardioverter-defibrillator), dual, in situ    a. 09/2013 s/p MDT Hillery Aldo XT CRT-D (ser #[id number]).  . LBBB (left bundle branch block)   . Non-obstructive CAD    a. 11/2001 Cath [facility]): LM nl, LAD 46m LCX nondom, nl, RI large, nl, RCA dominant, 474m.  . Marland Kitchenonischemic cardiomyopathy ([place]   a. 11/2001 nonobs cath; b. 09/2013 s/p MDT ViHillery AldoT CRT-D (ser #[id number]); c. 04/2014 Echo: EF 20-25%;  d. Followed @ [facility] by K. AdAndree ElkMD.  . OSA (obstructive sleep apnea)    a. Does not tolerate/use CPAP.  . Pre-diabetes   . PVD (peripheral vascular disease) ([place]   a. 10/2005 PTA: right external iliac artery angioplasty and left renal artery angioplasty and stenting;  b. 10/2015 ABI's in setting of recurrent claudication: R 0.97, L 0.77 (reduced by 0.12 from prior study).  . Renal artery stenosis ([place]   a.  10/2005 s/p L renal artery stenting.  [name] Respiratory failure requiring intubation ([place]) 05/15/2016   by [facility], extubated after arrival to [place]  . TIA (transient ischemic attack) 06/2010    Past Surgical History:  Procedure Laterality Date  . BI-VENTRICULAR IMPLANTABLE CARDIOVERTER DEFIBRILLATOR N/A 09/13/2013   Procedure: BI-VENTRICULAR IMPLANTABLE CARDIOVERTER DEFIBRILLATOR  (CRT-D);  Surgeon: Coralyn Mark, MD;  Location[facility] CATH LAB;  Service: Cardiovascular;  Laterality: N/A;  . BI-VENTRICULAR IMPLANTABLE CARDIOVERTER DEFIBRILLATOR  (CRT-D)  09-13-2013   MDT CRTD implanted by Dr [name]  . CARDIAC CATHETERIZATION  2003   Mild LV  dysfuntion. he had apical and inferoapical wall motion abnormalities and EF of 45%.  [name] CARDIAC CATHETERIZATION  10/18/2005   STENTS: left renal artery as well as both iliac arteries. Was stented with 28m x 488mSmart Nitinol self-expanding stent replaced with a 64m564m 4mm78mlloon at nominal pressures, resulting in a reduction od 50% proximal right external iliac artery  stenosis to 0% residual.  . CARDIAC CATHETERIZATION N/A 11/13/2015   Procedure: Right/Left Heart Cath and Coronary Angiography;  Surgeon: DaniJolaine Artist;  [facility];  Service: Cardiovascular;  Laterality: N/A;  . CARDIOVERSION N/A 02/25/2016   Procedure: CARDIOVERSION;  Surgeon: DaniJolaine Artist;  [facility];  Service: Cardiovascular;  Laterality: N/A;  . HAND SURGERY  01/1995  . HERNIA REPAIR    . RIGHT HEART CATH N/A 11/11/2016   Procedure: Right Heart Cath;  Surgeon: BensJolaine Artist;  [facility];  Service: Cardiovascular;  Laterality: N/A;  . TEE WITHOUT CARDIOVERSION N/A 02/25/2016   Procedure: TRANSESOPHAGEAL ECHOCARDIOGRAM (TEE);  Surgeon: DaniJolaine Artist;  [facility];  Service: Cardiovascular;  Laterality: N/A;  . TEE WITHOUT CARDIOVERSION N/A 05/20/2016   Procedure: TRANSESOPHAGEAL ECHOCARDIOGRAM (TEE);  Surgeon: DaniJolaine Artist;  [facility];  Service: Cardiovascular;  Laterality: N/A;    Current Outpatient Prescriptions  Medication Sig Dispense Refill  . allopurinol (ZYLOPRIM) 100 MG tablet Take 100 mg by mouth daily.    . amMarland Kitchenodarone (PACERONE) 200 MG tablet Take 200 mg by mouth 2 (two) times daily.    . atMarland Kitchenrvastatin (LIPITOR) 20 MG tablet Take 20 mg by mouth every evening.     . Blood Glucose Monitoring Suppl (FIFTY50 GLUCOSE METER 2.0) w/Device KIT     . bumetanide (BUMEX) 1 MG tablet Take 1-3 mg by mouth daily.    . ferrous sulfate 325 (65 FE) MG tablet Take 325 mg by mouth daily with breakfast.    . mirtazapine  (REMERON) 30 MG tablet Take 30 mg by mouth.    . nitroGLYCERIN (NITROSTAT) 0.4 MG SL tablet Place 0.4 mg under the tongue every 5 (five) minutes as needed for chest pain (maximum 3 tablets).    . OXYGEN Inhale 4 L into the lungs as directed.     . pantoprazole (PROTONIX) 20 MG tablet Take 20 mg by mouth daily.    . potassium chloride SA (K-DUR,KLOR-CON) 20 MEQ tablet Take 1 tablet (20 mEq total) by mouth daily. 30 tablet 3  . vitamin C (ASCORBIC ACID) 500 MG tablet Take 500 mg by mouth daily.    . ipMarland Kitchenatropium (ATROVENT) 0.02 % nebulizer solution Inhale 2.5 mL (500 mcg total) by nebulization every six (6) hours as needed for shortness of breath or wheezing    . ipratropium-albuterol (DUONEB) 0.5-2.5 (3) MG/3ML SOLN Inhale 3 mL by nebulization every eight (8) hours as needed  shortness of breath or wheezing     No current facility-administered medications for this visit.     Allergies:   Amoxicillin; Biaxin [clarithromycin]; Erythromycin; Penicillins; Shellfish allergy; and Sulfa antibiotics   Social History:  The patient  reports that he quit smoking about 12 years ago. His smoking use included Cigarettes. He quit after 52.00 years of use. He has never used smokeless tobacco. He reports that he does not drink alcohol or use drugs.   Family History:  The patient's family history includes Alzheimer's disease in his mother; Heart attack in his paternal grandfather; Heart disease in his father and paternal grandfather.  ROS:  Please see the history of present illness.    All other systems are reviewed and otherwise negative.   PHYSICAL EXAM:  VS:  BP (!) 96/50   Pulse 78   Ht '6\' 2"'$  (1.88 m)   Wt 154 lb 3.2 oz (69.9 kg)   SpO2 98%   BMI 19.80 kg/m  BMI: Body mass index is 19.8 kg/m. Well nourished, well developed, in no acute distress  HEENT: normocephalic, atraumatic  Neck: no JVD, carotid bruits or masses Cardiac:  normal S1, S2; RRR; no significant murmurs, no rubs, or gallops Lungs:  diminished at he bases b/l, no wheezing, rhonchi or rales  Abd: soft, nontender MS: no deformity age appropriate atrophy Ext: trace if any edema noted Skin: warm and dry, no rash Neuro:  No gross deficits appreciated Psych: euthymic mood, full affect  ICD site is stable, no tethering or discomfort   EKG:  Today reviewed by myself is AV paced, PVC DEVICE check today by industry and reviewed by myself: battery and lead measurements are stable, no VT, No AF/AMS, OptiVol has been very high (at maximum elevation) since April-May, 98.3% BiVe pacing  11/11/16: [facility], Dr. [name] Findings: RA = 1 RV = 34/3 PA = 32/10 (18) PCW = 7 Fick cardiac output/index = 6.6/3.5 PVR = 1.7 WU  FA sat = 93% PA sat = 62%, 64%  Assessment: 1. Normal cardiac output with low filling pressures Plan/Discussion: Will decrease bumex to 2 mg daily (was bid).  05/20/16: TEE Study Conclusions - Left ventricle: The estimated ejection fraction was 15%. Severe   diffuse hypokinesis. - Aortic valve: No evidence of vegetation. No evidence of   vegetation. - Mitral valve: No evidence of vegetation. There was moderate   regurgitation. - Left atrium: The atrium was severely dilated. - Right atrium: No evidence of thrombus in the atrial cavity or   appendage. - Tricuspid valve: No evidence of vegetation. - Pulmonic valve: No evidence of vegetation.  05/07/14: Echocardiogram Study Conclusions - Left ventricle: The cavity size was mildly dilated. Wall thickness was increased in a pattern of mild LVH. Systolic function was severely reduced. The estimated ejection fraction was in the range of 20% to 25%. Severe hypokinesis of the entire myocardium. Doppler parameters are consistent with abnormal left ventricular relaxation (grade 1 diastolic dysfunction). - Mitral valve: There was mild regurgitation. - Left atrium: The atrium was moderately dilated  Recent Labs: 04/22/2016: TSH 3.05 05/20/2016: ALT  18; Magnesium 2.1 11/08/2016: B Natriuretic Peptide 1,565.5 11/11/2016: BUN 38; Creatinine, Ser 2.29; Hemoglobin 9.4; Platelets 255; Potassium 4.0; Sodium 136  05/15/2016: Triglycerides 58    Wt Readings from Last 3 Encounters:  12/14/16 154 lb 3.2 oz (69.9 kg)  11/16/16 151 lb 6.4 oz (68.7 kg)  11/11/16 147 lb (66.7 kg)     Other studies reviewed: Additional studies/records reviewed today include:  summarized above    ASSESSMENT AND PLAN:  1. NICM, CRT-D     He reports stable weights at home     Optivol suggests significant fluid OL     Will defer to CHF visit today     2. Paroxysmal AFib     CHA2DS2Vasc is at least 6     Has been off Eliquis for weeks since his last hospital stay in [facility], he reports having a EGD/colonoscopy without bleeding afterwards, never told to resume by anyone, no specific comments by GI that he can recall.      I discussed with the patient/wife, if no contraindication to foll a/c would recommend it be resumed, he is seeing a couple of cardiology teams routinely and I will defer to his primary providers, he states he will discuss with dr. Hayden Pedro or the APP today at the CHF clinic, he was given a copy of his device interrogation to bring to his appointment at the HF clinic after here.  3. HTN     Now with hypotension    Disposition:  Continue q3 month remotes and annual in-clinic EP/devic visits, he follows very closely with CHF and [facility].   Current medicines are reviewed at length with the patient today.  The patient did not have any concerns regarding medicines  Signed, Jennings Books, PA-C 12/14/2016 11:34 AM     [facility] [address] [place] [address] [phone] (office)  [phone] (fax)

## 2016-12-14 ENCOUNTER — Ambulatory Visit (INDEPENDENT_AMBULATORY_CARE_PROVIDER_SITE_OTHER): Payer: Medicare Other | Admitting: Physician Assistant

## 2016-12-14 ENCOUNTER — Ambulatory Visit (HOSPITAL_COMMUNITY)
Admission: RE | Admit: 2016-12-14 | Discharge: 2016-12-14 | Disposition: A | Discharge status: Home / Self Care | Payer: Medicare Other | Source: Ambulatory Visit | Attending: Cardiology | Admitting: Cardiology

## 2016-12-14 ENCOUNTER — Encounter (HOSPITAL_COMMUNITY): Payer: Self-pay

## 2016-12-14 ENCOUNTER — Other Ambulatory Visit (HOSPITAL_COMMUNITY): Payer: Self-pay | Admitting: Internal Medicine

## 2016-12-14 ENCOUNTER — Encounter: Payer: Self-pay | Admitting: Physician Assistant

## 2016-12-14 VITALS — BP 116/54 | HR 93 | Wt 153.4 lb

## 2016-12-14 VITALS — BP 96/50 | HR 78 | Ht 74.0 in | Wt 154.2 lb

## 2016-12-14 DIAGNOSIS — Z882 Allergy status to sulfonamides status: Secondary | ICD-10-CM | POA: Insufficient documentation

## 2016-12-14 DIAGNOSIS — I10 Essential (primary) hypertension: Secondary | ICD-10-CM | POA: Diagnosis not present

## 2016-12-14 DIAGNOSIS — I5022 Chronic systolic (congestive) heart failure: Secondary | ICD-10-CM | POA: Insufficient documentation

## 2016-12-14 DIAGNOSIS — Z91013 Allergy to seafood: Secondary | ICD-10-CM | POA: Diagnosis not present

## 2016-12-14 DIAGNOSIS — I428 Other cardiomyopathies: Secondary | ICD-10-CM | POA: Insufficient documentation

## 2016-12-14 DIAGNOSIS — I447 Left bundle-branch block, unspecified: Secondary | ICD-10-CM | POA: Insufficient documentation

## 2016-12-14 DIAGNOSIS — Z8249 Family history of ischemic heart disease and other diseases of the circulatory system: Secondary | ICD-10-CM | POA: Insufficient documentation

## 2016-12-14 DIAGNOSIS — I739 Peripheral vascular disease, unspecified: Secondary | ICD-10-CM | POA: Insufficient documentation

## 2016-12-14 DIAGNOSIS — Z880 Allergy status to penicillin: Secondary | ICD-10-CM | POA: Diagnosis not present

## 2016-12-14 DIAGNOSIS — G4733 Obstructive sleep apnea (adult) (pediatric): Secondary | ICD-10-CM | POA: Diagnosis not present

## 2016-12-14 DIAGNOSIS — Z79899 Other long term (current) drug therapy: Secondary | ICD-10-CM | POA: Diagnosis not present

## 2016-12-14 DIAGNOSIS — D500 Iron deficiency anemia secondary to blood loss (chronic): Secondary | ICD-10-CM | POA: Insufficient documentation

## 2016-12-14 DIAGNOSIS — I779 Disorder of arteries and arterioles, unspecified: Secondary | ICD-10-CM | POA: Diagnosis not present

## 2016-12-14 DIAGNOSIS — I130 Hypertensive heart and chronic kidney disease with heart failure and stage 1 through stage 4 chronic kidney disease, or unspecified chronic kidney disease: Secondary | ICD-10-CM | POA: Diagnosis not present

## 2016-12-14 DIAGNOSIS — Z95810 Presence of automatic (implantable) cardiac defibrillator: Secondary | ICD-10-CM

## 2016-12-14 DIAGNOSIS — Z87891 Personal history of nicotine dependence: Secondary | ICD-10-CM | POA: Insufficient documentation

## 2016-12-14 DIAGNOSIS — N184 Chronic kidney disease, stage 4 (severe): Secondary | ICD-10-CM

## 2016-12-14 DIAGNOSIS — I480 Paroxysmal atrial fibrillation: Secondary | ICD-10-CM | POA: Insufficient documentation

## 2016-12-14 DIAGNOSIS — I2583 Coronary atherosclerosis due to lipid rich plaque: Secondary | ICD-10-CM

## 2016-12-14 DIAGNOSIS — I4891 Unspecified atrial fibrillation: Secondary | ICD-10-CM

## 2016-12-14 DIAGNOSIS — I2510 Atherosclerotic heart disease of native coronary artery without angina pectoris: Secondary | ICD-10-CM | POA: Insufficient documentation

## 2016-12-14 DIAGNOSIS — R195 Other fecal abnormalities: Secondary | ICD-10-CM | POA: Insufficient documentation

## 2016-12-14 DIAGNOSIS — I472 Ventricular tachycardia: Secondary | ICD-10-CM | POA: Diagnosis not present

## 2016-12-14 DIAGNOSIS — E785 Hyperlipidemia, unspecified: Secondary | ICD-10-CM | POA: Diagnosis not present

## 2016-12-14 DIAGNOSIS — R7303 Prediabetes: Secondary | ICD-10-CM | POA: Insufficient documentation

## 2016-12-14 DIAGNOSIS — I6522 Occlusion and stenosis of left carotid artery: Secondary | ICD-10-CM | POA: Diagnosis not present

## 2016-12-14 DIAGNOSIS — Z8673 Personal history of transient ischemic attack (TIA), and cerebral infarction without residual deficits: Secondary | ICD-10-CM | POA: Diagnosis not present

## 2016-12-14 DIAGNOSIS — Z881 Allergy status to other antibiotic agents status: Secondary | ICD-10-CM | POA: Diagnosis not present

## 2016-12-14 LAB — CBC
HEMATOCRIT: 34 % — AB (ref 39.0–52.0)
HEMOGLOBIN: 10.5 g/dL — AB (ref 13.0–17.0)
MCH: 28.3 pg (ref 26.0–34.0)
MCHC: 30.9 g/dL (ref 30.0–36.0)
MCV: 91.6 fL (ref 78.0–100.0)
Platelets: 165 10*3/uL (ref 150–400)
RBC: 3.71 MIL/uL — AB (ref 4.22–5.81)
RDW: 21.1 % — ABNORMAL HIGH (ref 11.5–15.5)
WBC: 4.5 10*3/uL (ref 4.0–10.5)

## 2016-12-14 LAB — BASIC METABOLIC PANEL
ANION GAP: 12 (ref 5–15)
BUN: 37 mg/dL — AB (ref 6–20)
CO2: 26 mmol/L (ref 22–32)
Calcium: 9.1 mg/dL (ref 8.9–10.3)
Chloride: 102 mmol/L (ref 101–111)
Creatinine, Ser: 2.74 mg/dL — ABNORMAL HIGH (ref 0.61–1.24)
GFR calc Af Amer: 23 mL/min — ABNORMAL LOW (ref >60)
GFR calc non Af Amer: 20 mL/min — ABNORMAL LOW (ref >60)
GLUCOSE: 114 mg/dL — AB (ref 65–99)
POTASSIUM: 4.1 mmol/L (ref 3.5–5.1)
Sodium: 140 mmol/L (ref 135–145)

## 2016-12-14 LAB — BRAIN NATRIURETIC PEPTIDE: B Natriuretic Peptide: 1681.4 pg/mL — ABNORMAL HIGH (ref 0.0–100.0)

## 2016-12-14 MED ORDER — BUMETANIDE 1 MG PO TABS: 1.0000 mg | ORAL_TABLET | Freq: Two times a day (BID) | ORAL | 6 refills | Status: DC

## 2016-12-14 NOTE — Patient Instructions (Addendum)
Medication Instructions:    Your physician recommends that you continue on your current medications as directed. Please refer to the Current Medication list given to you today.   If you need a refill on your cardiac medications before your next appointment, please call your pharmacy.  Labwork: NONE ORDERED  TODAY    Testing/Procedures:  NONE ORDERED  TODAY    Follow-Up:  Your physician wants you to follow-up in: ONE YEAR WITH ALLRED  You will receive a reminder letter in the mail two months in advance. If you don't receive a letter, please call our office to schedule the follow-up appointment.  Remote monitoring is used to monitor your Pacemaker of ICD from home. This monitoring reduces the number of office visits required to check your device to one time per year. It allows [place] to keep an eye on the functioning of your device to ensure it is working properly. You are scheduled for a device check from home on . AS SCHEDULED You may send your transmission at any time that day. If you have a wireless device, the transmission will be sent automatically. After your physician reviews your transmission, you will receive a postcard with your next transmission date.     Any Other Special Instructions Will Be Listed Below (If Applicable).                                                                                                                                                 '

## 2016-12-14 NOTE — Progress Notes (Signed)
Patient ID: Scott Parsons, male   DOB: Apr 24, 1930, 83 y.o.   MRN: [id number]    Advanced Heart Failure Clinic Note    Primary Care: Scott Qua, MD Primary Cardiologist: Dr Scott Parsons Primary HF: Dr. Gala Parsons   HPI: Scott Parsons is a 83 y.o. male with history of NICM, Chronic systolic CHF (Echo 11/2015 EF 20-25%), LBBB s/p biV ICD, CKD III-IV, HTN, PAD, Carotid stenosis, and RAS s/p renal stenting.   He was previously followed closely by Scott Parsons at [facility]. In 09/2015 was found to have worsening of renal fxn along with hyperkalemia. He was admitted to [facility] and his lisinopril, valsartan, and spironolactone were discontinued. Labs were stable upon f/u and he was subsequently placed on entresto.  Was taken to [facility] ED on 11/11/15 with acute worsening dyspnea. Required Bipap. Tx to [facility] for further eval. Was admitted by CCM for resp failure. Had significant symptom improvement with diuresis. Catheterization as below with mild non-obstructive CAD and well compensated hemodynamics after diuresis. Diuresed 2.2 L. Weight 190 on discharge.   Admitted 8/26-8/30 with flash pulmonary edema. Diuresed with IV lasix. ICD adjusted to increase pacing. Out 10 lbs with IV lasix. Discharge weight was 180 lbs.   Admitted 1/6 through 05/21/16 with respiratory failure and volume overload. Required short term intubation. Treated for pneumonia. Placed on dobutamine and levophed. Discharged on lasix 40 mg daily. Discharge weight 181 pounds.   Admitted 09/23/16-10/14/16 at [facility] for CHF exacerbation. Also with anemia, heme + stool. Endoscopy did not show any source for bleeding. Required 2 units PRBC's. His Eliquis and ASA were held at discharge. Discharge weight was 155 pounds, Hgb was 8.7 at discharge.   He presents today for regular follow up.  Feeling OK overall. Has been taking extra bumex for the past week. Has been taking 1 mg q am and 2 mg q pm instead of 1 mg BID. Weight at home 150-151 and  stable for the past 2-3 months. His "sunday" shoes don't feet. Appetite improving but not gaining weight. Wears 02 at night. Denies SOB getting around the house. Does get SOB changing clothes, but not showering. No bendopnea. Occasional lightheadedness with rapid standing but not marked or limiting. Blood pressures have been running in 70-80s at home.   Optivol: 12/14/16 Fluid impedence chronically elevated and inaccurate. No AT/AF, VT VF. 0% A fib burden. Pt activity increasing over past 1-2 weeks to 1-2 hours daily.   Review of systems complete and found to be negative unless listed in HPI.   Labs  05/21/2016: K 3.6 Creatinine 1.56   RHC 11/11/16 RA = 1 RV = 34/3 PA = 32/10 (18) PCW = 7 Fick cardiac output/index = 6.6/3.5 PVR = 1.7 WU  FA sat = 93% PA sat = 62%, 64%    R/LHC 11/13/15  Mild non-obstructive CAD, and well compensated hemodynamics after diuresis.  RA = 3 RV = 36/3 PA = 34/12 (20) PCW = 12 Ao = 118/48 (72) LV = 129/18 Fick cardiac output/index = 5.6/2.6 PVR = 1.4 WU SVR = 984 FA sat = 87% PA sat = 58%, 55%   TEE 05/20/16 LVEF 15%, no evidence of endocarditis.   Echo 11/13/15  LVEF 20-25% with diffuse hypokinesis. Mild AS, Moderate MR, Severe LAE   Past Medical History:  Diagnosis Date  . Carotid artery disease (HCC)    a. 04/2015 mild to mod bilat ICA stenosis;  b. 10/2015 >70% LICA stenosis, no significant dzs on right.  . Chronic  combined systolic and diastolic CHF, NYHA class 4 (Scott Parsons)    a. 01/2013 Echo: EF 25-30%; b. 08/2013 Echo: EF 15-20%; c. 04/2014 Echo: EF 20-25%, mild LVH, Gr1 DD, mild MR, mod dil LA.  . CKD (chronic kidney disease), stage IV ([place])    a. baseline creat of ~ 1.8;  b. 09/2015 Admitted to [facility] with AKI and hyperkalemia.  [name] History of stroke   . Hyperlipidemia   . Hypertensive heart disease with CHF (congestive heart failure) ([place])   . ICD (implantable cardioverter-defibrillator), dual, in situ    a. 09/2013 s/p MDT Scott Parsons CRT-D (ser  #[id number]).  . LBBB (left bundle branch block)   . Non-obstructive CAD    a. 11/2001 Cath [facility]): LM nl, LAD 75m LCX nondom, nl, RI large, nl, RCA dominant, 453m.  . Mar[place]onischemic cardiomyopathy ([place]   a. 11/2001 nonobs cath; b. 09/2013 s/p MDT ViHillery Parsons CRT-D (ser #B#[id number]); c. 04/2014 Echo: EF 20-25%;  d. Followed @ [facility] by Scott Parsons.  . OSA (obstructive sleep apnea)    a. Does not tolerate/use CPAP.  . Pre-diabetes   . PVD (peripheral vascular disease) (HC[place]   a. 10/2005 PTA: right external iliac artery angioplasty and left renal artery angioplasty and stenting;  b. 10/2015 ABI's in setting of recurrent claudication: R 0.97, L 0.77 (reduced by 0.12 from prior study).  . Renal artery stenosis ([place]   a. 10/2005 s/p L renal artery stenting.  . Marland Kitchenespiratory failure requiring intubation (HCTheba01/10/2016   by [facility], extubated after arrival to [place]  . TIA (transient ischemic attack) 06/2010    Current Outpatient Prescriptions  Medication Sig Dispense Refill  . acetaminophen (TYLENOL) 325 MG tablet Take 650 mg by mouth every 6 (six) hours as needed for mild pain.    . Marland Kitchenllopurinol (ZYLOPRIM) 100 MG tablet Take 100 mg by mouth daily.    . Marland Kitchenmiodarone (PACERONE) 200 MG tablet Take 200 mg by mouth daily.     . Marland Kitchentorvastatin (LIPITOR) 20 MG tablet Take 20 mg by mouth every evening.     . Blood Glucose Monitoring Suppl (FIFTY50 GLUCOSE METER 2.0) w/Device KIT     . bumetanide (BUMEX) 1 MG tablet Take 1-2 mg by mouth 2 (two) times daily. Take 1 mg (1 tablet) in the morning and 2 mg (2 tablets) in the afternoon    . ferrous sulfate 325 (65 FE) MG tablet Take 325 mg by mouth daily with breakfast.    . ipratropium (ATROVENT) 0.02 % nebulizer solution Inhale 2.5 mL (500 mcg total) by nebulization every six (6) hours as needed for shortness of breath or wheezing    . ipratropium-albuterol (DUONEB) 0.5-2.5 (3) MG/3ML SOLN Inhale 3 mL by nebulization every eight (8) hours as  needed shortness of breath or wheezing    . mirtazapine (REMERON) 30 MG tablet Take 30 mg by mouth.    . OXYGEN Inhale 4 L into the lungs as directed.     . pantoprazole (PROTONIX) 40 MG tablet Take 40 mg by mouth daily.    . potassium chloride SA (K-DUR,KLOR-CON) 20 MEQ tablet Take 1 tablet (20 mEq total) by mouth daily. 30 tablet 3  . sacubitril-valsartan (ENTRESTO) 24-26 MG Take 1 tablet by mouth daily.    . vitamin C (ASCORBIC ACID) 500 MG tablet Take 500 mg by mouth daily.    . nitroGLYCERIN (NITROSTAT) 0.4 MG SL tablet Place 0.4 mg under the tongue every 5 (five)  minutes as needed for chest pain (maximum 3 tablets).     No current facility-administered medications for this encounter.     Allergies  Allergen Reactions  . Amoxicillin Itching       . Biaxin [Clarithromycin] Itching and Swelling    Lip swelling     . Erythromycin Itching  . Penicillins Hives and Itching    Has patient had a PCN reaction causing immediate rash, facial/tongue/throat swelling, SOB or lightheadedness with hypotension: Yes Has patient had a PCN reaction causing severe rash involving mucus membranes or skin necrosis: No Has patient had a PCN reaction that required hospitalization No Has patient had a PCN reaction occurring within the last 10 years: No If all of the above answers are "NO", then may proceed with Cephalosporin use.  . Shellfish Allergy Itching        . Sulfa Antibiotics Other (See Comments)    Hyperkalemia  AKI and hyperkalemia      Social History   Social History  . Marital status: Married    Spouse name: N/A  . Number of children: N/A  . Years of education: N/A   Occupational History  . Retired    Social History Main Topics  . Smoking status: Former Smoker    Years: 52.00    Types: Cigarettes    Quit date: 08/11/2004  . Smokeless tobacco: Never Used  . Alcohol use No  . Drug use: No  . Sexual activity: Yes    Partners: Female   Other Topics Concern  . Not on file     Social History Narrative   Retired.  Lives in [place] with his wife.  Tries to remain active.      Family History  Problem Relation Age of Onset  . Alzheimer's disease Mother   . Heart disease Father   . Heart disease Paternal Grandfather   . Heart attack Paternal Grandfather     Vitals:   12/14/16 1343  BP: (!) 116/54  Pulse: 93  SpO2: 99%  Weight: 153 lb 6.4 oz (69.6 kg)    Wt Readings from Last 3 Encounters:  12/14/16 153 lb 6.4 oz (69.6 kg)  12/14/16 154 lb 3.2 oz (69.9 kg)  11/16/16 151 lb 6.4 oz (68.7 kg)     PHYSICAL EXAM:  General: Elderly and fatigued appearing. Ambulated into clinic without difficulty.  HEENT: Normal Neck: Supple. JVP 9-10 cm. Carotids 2+ bilat; no bruits. No thyromegaly or nodule noted. Cor: PMI nondisplaced. RRR, 2/6 TR murmur Lungs: CTAB, Diminished basilar sounds.  Abdomen: Soft, non-tender, non-distended, no HSM. No bruits or masses. +BS  Extremities: No cyanosis, clubbing, or rash. Trace to 1+ edema. Cool to the touch. Neuro: Alert & orientedx3, cranial nerves grossly intact. moves all 4 extremities w/o difficulty. Affect pleasant   ASSESSMENT & PLAN:  1. Chronic systolic HF: NICM, Echo 12/996 EF 20-25%, TEE in 05/2016 EF 15%. S/p Medtronic ICD - NYHA III-IIIb - Volume status elevated by Optiviol, but chronically so. He is at least mildly volume overloaded on exam, and weight up ~9 lbs from cath above.  - Take Bumex 2 mg BID x 2 days, then return to 1 mg BID.  - Recent RHC with normal output and low filling pressures - Spiro stopped in the past due to AKI.  - He should NOT be on Entresto once daily. He has not tolerated Entresto or Losartan in the past with AKI. Will stop Entresto. Of note, patients BP has been in 70-80s at  home.  - No beta blocker with suspected low output.     2. CKD III -IV - Baseline creatinine 2.2-2.6. - Concerned what this may be with recent blood pressures drops. BMET today.   3. NSVT and frequent  PVCs:  - Labs today.  - s/p ICD.   4. Carotid stenosis: - 53% LICA stenosis - Continue statin.  - Repeat carotid duplex in Dec. 2018.  - Can stop niacin. No change.   5. CAD:  Mild non obstructive on cath 11/2015 - Not on ASA with stable CAD and on Eliquis.  - Continue atorvastatin 20 mg daily.  No change.   6. PAF:  - s/p TEE/DCCV 02/25/16.  - No further afib since DCCV as of 04/20/16.  - No afib by device interrogation today.   - CHA2DS2-VASc Score 5.  - Eliquis has been on hold since discharge on 10/14/16.  - Continue amio 200 mg daily.   7. Heme positive stool, blood loss anemia - No bleeding. CBC today.   8. OSA - Not wearing his CPAP as often. - Encouraged nightly CPAP use.   Feeling OK overall. Soft pressures. Stop Entresto. Mildly overloaded so will take extra bumex for next 2 days.   Shirley Friar, PA-C  12/14/2016   Greater than 50% of the 25 minute visit was spent in counseling/coordination of care regarding disease state education, sliding scale diuretics, salt/fluid restriction, alarm symptoms, and hypotension education.

## 2016-12-14 NOTE — Patient Instructions (Signed)
Routine lab work today. Will notify you of abnormal results, otherwise no news is good news!  INCREASE Bumex to 2 mg (2 tabs) twice daily for two days, then reduce to 1 mg (1 tab) twice daily every day thereafter.  Follow up 4-6 weeks with Otilio Saber PA-C.  Take all medication as prescribed the day of your appointment. Bring all medications with you to your appointment.  Do the following things EVERYDAY: 1) Weigh yourself in the morning before breakfast. Write it down and keep it in a log. 2) Take your medicines as prescribed 3) Eat low salt foods-Limit salt (sodium) to 2000 mg per day.  4) Stay as active as you can everyday 5) Limit all fluids for the day to less than 2 liters

## 2016-12-14 NOTE — Progress Notes (Signed)
Advanced Heart Failure Medication Review by a Pharmacist  Does the patient  feel that his/her medications are working for him/her?  yes  Has the patient been experiencing any side effects to the medications prescribed?  no  Does the patient measure his/her own blood pressure or blood glucose at home?  yes   Does the patient have any problems obtaining medications due to transportation or finances?   no  Understanding of regimen: good Understanding of indications: good Potential of compliance: good Patient understands to avoid NSAIDs. Patient understands to avoid decongestants.  Issues to address at subsequent visits: None   Pharmacist comments: Mr. Scott Parsons is a pleasant 83 yo M presenting with his wife and his weekly pillpack from [facility]. He reports good compliance with his regimen but states that he has been requiring bumex 1 mg qam and 2 mg qpm instead of previously prescribed 1 mg BID since he thinks he has been holding on to more fluid recently. He also states that he has only been taking Entresto once daily likely 2/2 low BP. He did not have any other medication-related questions or concerns for me at this time.   [name] Bonnye Fava, PharmD, BCPS, CPP Clinical Pharmacist Pager: [phone] Phone: [phone] 12/14/2016 1:46 PM      Time with patient: 10 minutes Preparation and documentation time: 2 minutes Total time: 12 minutes

## 2016-12-15 ENCOUNTER — Telehealth (HOSPITAL_COMMUNITY): Payer: Self-pay | Admitting: Cardiology

## 2016-12-15 DIAGNOSIS — I509 Heart failure, unspecified: Secondary | ICD-10-CM

## 2016-12-15 MED ORDER — APIXABAN 2.5 MG PO TABS: 2.5000 mg | ORAL_TABLET | Freq: Two times a day (BID) | ORAL | 6 refills | Status: DC

## 2016-12-15 NOTE — Telephone Encounter (Signed)
Patient aware. Patient voiced understanding, repeat labs 8/16

## 2016-12-15 NOTE — Telephone Encounter (Signed)
-----   Message from Graciella Freer, PA-C sent at 12/14/2016  3:30 PM EDT -----  Creatinine elevated. Entresto stopped. Please recheck 7-10 days.   Per Dr. Gala Romney pt needs to restart Eliquis for anticoagulation with his Afib.  Please prescribe 2.5 mg BID. Pt needs to alert [place] right away with any recurrent GI bleeding.     Casimiro Needle [address] [place], PA-C 12/14/2016 3:30 PM

## 2016-12-16 ENCOUNTER — Other Ambulatory Visit (HOSPITAL_COMMUNITY): Payer: Self-pay | Admitting: Cardiology

## 2016-12-16 MED ORDER — POTASSIUM CHLORIDE CRYS ER 20 MEQ PO TBCR: 20.0000 meq | EXTENDED_RELEASE_TABLET | Freq: Every day | ORAL | 3 refills | Status: AC

## 2016-12-23 ENCOUNTER — Ambulatory Visit (HOSPITAL_COMMUNITY)
Admission: RE | Admit: 2016-12-23 | Discharge: 2016-12-23 | Disposition: A | Discharge status: Home / Self Care | Payer: Medicare Other | Source: Ambulatory Visit | Attending: Internal Medicine | Admitting: Internal Medicine

## 2016-12-23 DIAGNOSIS — I509 Heart failure, unspecified: Secondary | ICD-10-CM | POA: Diagnosis not present

## 2016-12-23 LAB — BASIC METABOLIC PANEL
ANION GAP: 8 (ref 5–15)
BUN: 35 mg/dL — ABNORMAL HIGH (ref 6–20)
CO2: 30 mmol/L (ref 22–32)
Calcium: 9 mg/dL (ref 8.9–10.3)
Chloride: 104 mmol/L (ref 101–111)
Creatinine, Ser: 2.97 mg/dL — ABNORMAL HIGH (ref 0.61–1.24)
GFR calc non Af Amer: 18 mL/min — ABNORMAL LOW (ref >60)
GFR, EST AFRICAN AMERICAN: 21 mL/min — AB (ref >60)
GLUCOSE: 109 mg/dL — AB (ref 65–99)
POTASSIUM: 4.1 mmol/L (ref 3.5–5.1)
Sodium: 142 mmol/L (ref 135–145)

## 2016-12-27 ENCOUNTER — Ambulatory Visit (INDEPENDENT_AMBULATORY_CARE_PROVIDER_SITE_OTHER): Payer: Self-pay

## 2016-12-27 DIAGNOSIS — Z95810 Presence of automatic (implantable) cardiac defibrillator: Secondary | ICD-10-CM

## 2016-12-27 DIAGNOSIS — I509 Heart failure, unspecified: Secondary | ICD-10-CM

## 2016-12-27 NOTE — Progress Notes (Signed)
  His Optivol is markedly inaccurate.  With Fluid Index maxed out RHC showed CVP of 3. Please do not change treatment based on optivol alone. OK to continue taking extra 1 mg Bumex as needed.     Casimiro Needle [address]" [place], PA-C 12/27/2016 3:35 PM

## 2016-12-27 NOTE — Progress Notes (Signed)
Call to patient and advised recommendation from Maxine Glenn, PA is to take extra 1 mg Bumex as needed for symptoms.  Explained the report may not be as accurate for him and will base any change in treatment based on symptoms.  Advised will change next transmission to 01/20/2017.

## 2016-12-27 NOTE — Progress Notes (Addendum)
EPIC Encounter for ICM Monitoring  Patient Name: Scott Parsons is a 83 y.o. male Date: 12/27/2016 Primary Care Physican: [name], MD Primary Cardiologist:Berry/Bensimhon Electrophysiologist: Allred Dry Weight:153lbs  Bi-V Pacing: 97.5%      Heart Failure questions reviewed, pt reported he still has a little swelling in feet.  Weight stable from 151 lbs to 153 lbs.   Patient reported taking taking 1 extra Bumetanide yesterday.    Thoracic impedance abnormal suggesting fluid accumulation since mid June.  Prescribed dosage: Bumetanide '1mg'$  take 1 tab twice a day.  Potassium 20 mEq 1tabletdaily.   Labs:  12/23/2016 Creatinine 2.97, BUN 35, Potassium 4.1, Sodium 142, EGFR 18-21  12/14/2016 Creatinine 2.74, BUN 37, Potassium 4.1, Sodium 140, EGFR 20-23 12/02/2016 Creatinine 2.40, BUN 44, Potassium 4.9, Sodium 139 Care Everywhere results 11/16/2016 Creatinine 2.23, BUN 35, Potassium 4.1, Sodium 136, Care Everywhere results 11/11/2016 Creatinine 2.29, BUN 38, Potassium 4.0, Sodium 136, EGFR 25-28 11/08/2016 Creatinine 2.23, BUN 37, Potassium 3.8, Sodium 136, EGFR 19-23 08/25/2016 Creatinine 1.90, BUN 44, Potassium 3.6, Sodium 135, EGFR 34-41 Care Everywhere 08/12/2016 Creatinine 2.78, BUN 37, Potassium 3.9. Sodium 136, EGFR 19-23 08/09/2016 Creatinine 2.62, BUN 37, Potassium 3.9, Sodium 138, EGFR 21-24 07/05/2016 Creatinine 2.30, BUN 29, Potassium 3.8, Sodium 139, EGFR 24-28 06/17/2016 Creatinine 2.21, BUN 32, Potassium 4.0, Sodium 137, EGFR 26-30 06/10/2016 Creatinine 2.37, BUN 35, Potassium 4.2, Sodium 135, EGFR 24-27 01/12/2018Creatinine 1.56, BUN 30, Potassium 3.6, Sodium 139, EGFR 39-45  01/11/2018Creatinine 1.51, BUN 32, Potassium 3.9, Sodium 140, EGFR 41-47  01/10/2018Creatinine 1.47, BUN 34, Potassium 3.6, Sodium 140, EGFR 42-49  05/18/2016 Creatinine 1.90, BUN 46, Potassium 3.4, Sodium 143, EGFR 31-36  01/08/2018Creatinine 2.49, BUN 55, Potassium 3.4, Sodium  141, EGFR 22-26   Recommendations:  Copy of ICM check sent to Barrington Ellison, HF PA, Dr [name] and Dr [name] for review and if any recommendations will call back.  Follow-up plan: ICM clinic phone appointment on 01/06/2017 to recheck fluid levels.  Office appointment scheduled 01/25/2017 with HF clinic.     3 month ICM trend: 12/27/2016   1 Year ICM trend:      Rosalene Billings, RN 12/27/2016 3:18 PM

## 2016-12-29 NOTE — Progress Notes (Signed)
Thanks! We will check on him.    We have had a LOT of trouble with his optivol.  If you see his optivol reading on July 5th, for example.  His fluid index was maxed out, and his thoracic impedence was depressed, but his CVP was 1 on a RHC. We've had similar problems with him in the past.    I'll send a triage not to have our nurses call and check on him.   Casimiro Needle [address]" [place], PA-C 12/29/2016 7:27 AM

## 2016-12-29 NOTE — Progress Notes (Signed)
His optivol is all over the place.   Could you please call and check on him?  He may benefit from an appointment next week if he is having trouble managing his fluid/symptoms.   Thanks!   Casimiro Needle [address]" [place], PA-C 12/29/2016 7:29 AM

## 2016-12-30 NOTE — Progress Notes (Signed)
Called and spoke with patient's wife and she reported patient is feeling fine, no shortness of breath.  Weights have been as follows:  8/19- 153.2 8/20-151.6 lb 8/21- 151.4 lb 8/22- 152.6 lbs 8/23- 152.6 lbs  Spoke with Maxine Glenn, PA and no changes for now.

## 2017-01-13 ENCOUNTER — Other Ambulatory Visit: Payer: Self-pay | Admitting: Cardiovascular Disease

## 2017-01-13 DIAGNOSIS — I10 Essential (primary) hypertension: Secondary | ICD-10-CM

## 2017-01-20 ENCOUNTER — Ambulatory Visit (INDEPENDENT_AMBULATORY_CARE_PROVIDER_SITE_OTHER): Payer: Medicare Other

## 2017-01-20 DIAGNOSIS — Z95810 Presence of automatic (implantable) cardiac defibrillator: Secondary | ICD-10-CM

## 2017-01-20 DIAGNOSIS — I5022 Chronic systolic (congestive) heart failure: Secondary | ICD-10-CM | POA: Diagnosis not present

## 2017-01-20 NOTE — Progress Notes (Signed)
EPIC Encounter for ICM Monitoring  Patient Name: Scott Parsons is a 83 y.o. male Date: 01/20/2017 Primary Care Physican: [name], MD Primary Cardiologist:Berry/Bensimhon Electrophysiologist: Allred Dry Weight:150.5 - 152lbs  Bi-V Pacing: 97.4%       Heart Failure questions reviewed, pt feeling extremely tired.  He stated breathing is fine and weight is stable.     Thoracic impedance abnormal suggesting fluid accumulation.  Per HF clinic, unsure if Optivol is accurate for patient.   Prescribed dosage: Bumetanide 87m take 1 tab twice a day.  Potassium 20 mEq 1tabletdaily.   Labs:  01/18/2017 Creatinine 2.66, BUN 44, Potassium 4.0, Sodium 143 Care Everywhere results 01/13/2017 Creatinine 2.81, BUN 47, Potassium 4.1, Sodium 141 Care Everywhere results 12/23/2016 Creatinine 2.97, BUN 35, Potassium 4.1, Sodium 142, EGFR 18-21  12/14/2016 Creatinine 2.74, BUN 37, Potassium 4.1, Sodium 140, EGFR 20-23 12/02/2016 Creatinine 2.40, BUN 44, Potassium 4.9, Sodium 139 Care Everywhere results 11/16/2016 Creatinine 2.23, BUN 35, Potassium 4.1, Sodium 136, Care Everywhere results 11/11/2016 Creatinine 2.29, BUN 38, Potassium 4.0, Sodium 136, EGFR 25-28 11/08/2016 Creatinine 2.23, BUN 37, Potassium 3.8, Sodium 136, EGFR 19-23 08/25/2016 Creatinine 1.90, BUN 44, Potassium 3.6, Sodium 135, EGFR 34-41 Care Everywhere 08/12/2016 Creatinine 2.78, BUN 37, Potassium 3.9. Sodium 136, EGFR 19-23 08/09/2016 Creatinine 2.62, BUN 37, Potassium 3.9, Sodium 138, EGFR 21-24 07/05/2016 Creatinine 2.30, BUN 29, Potassium 3.8, Sodium 139, EGFR 24-28 06/17/2016 Creatinine 2.21, BUN 32, Potassium 4.0, Sodium 137, EGFR 26-30 06/10/2016 Creatinine 2.37, BUN 35, Potassium 4.2, Sodium 135, EGFR 24-27 01/12/2018Creatinine 1.56, BUN 30, Potassium 3.6, Sodium 139, EGFR 39-45  01/11/2018Creatinine 1.51, BUN 32, Potassium 3.9, Sodium 140, EGFR 41-47  01/10/2018Creatinine 1.47, BUN 34, Potassium 3.6, Sodium  140, EGFR 42-49  05/18/2016 Creatinine 1.90, BUN 46, Potassium 3.4, Sodium 143, EGFR 31-36  01/08/2018Creatinine 2.49, BUN 55, Potassium 3.4, Sodium 141, EGFR 22-26   Recommendations: No changes.   Encouraged to call for fluid symptoms.  Follow-up plan: ICM clinic phone appointment on 02/10/2017 to recheck fluid levels.  Office appointment scheduled 01/25/2017 with HF clinic.  Copy of ICM check sent to Dr. ARayann Hemanand Dr. BHaroldine Laws   3 month ICM trend: 01/20/2017   1 Year ICM trend:      LRosalene Billings RN 01/20/2017 2:47 PM

## 2017-01-22 ENCOUNTER — Other Ambulatory Visit (HOSPITAL_COMMUNITY): Payer: Self-pay | Admitting: Internal Medicine

## 2017-01-25 ENCOUNTER — Encounter (HOSPITAL_COMMUNITY): Payer: Medicare Other

## 2017-01-31 ENCOUNTER — Ambulatory Visit (HOSPITAL_COMMUNITY)
Admission: RE | Admit: 2017-01-31 | Discharge: 2017-01-31 | Disposition: A | Discharge status: Home / Self Care | Payer: Medicare Other | Source: Ambulatory Visit | Attending: Internal Medicine | Admitting: Internal Medicine

## 2017-01-31 DIAGNOSIS — I10 Essential (primary) hypertension: Secondary | ICD-10-CM | POA: Diagnosis present

## 2017-02-03 ENCOUNTER — Other Ambulatory Visit: Payer: Self-pay

## 2017-02-03 DIAGNOSIS — I10 Essential (primary) hypertension: Secondary | ICD-10-CM

## 2017-02-03 DIAGNOSIS — I779 Disorder of arteries and arterioles, unspecified: Secondary | ICD-10-CM

## 2017-02-10 ENCOUNTER — Ambulatory Visit (INDEPENDENT_AMBULATORY_CARE_PROVIDER_SITE_OTHER): Payer: Medicare Other

## 2017-02-10 DIAGNOSIS — Z95810 Presence of automatic (implantable) cardiac defibrillator: Secondary | ICD-10-CM

## 2017-02-10 DIAGNOSIS — I5022 Chronic systolic (congestive) heart failure: Secondary | ICD-10-CM

## 2017-02-10 NOTE — Progress Notes (Signed)
EPIC Encounter for ICM Monitoring  Patient Name: Scott Parsons is a 83 y.o. male Date: 02/10/2017 Primary Care Physican: [name], MD Primary Cardiologist:Berry/Bensimhon Electrophysiologist: Allred Dry Weight:147lbs  Bi-V Pacing: 96.8%      Heart Failure questions reviewed, pt asymptomatic since being discharged from [facility] on 9/16.    Thoracic impedance returned to baseline normal since 02/01/2017 (after being seen in [facility] ER for CHF exacerbation. Received IV diuresis)  Prescribed dosage: Bumetanide 62m take 1 tab twice a day. Potassium 20 mEq 1tabletdaily.   Labs:  01/25/2017 Creatinine 2.45, BUN 46, Potassium 3.9, Sodium 140, EGFR 25-31 Care Everywhere results 01/23/2017 Creatinine 2.50, BUN 52, Potassium 3.9, Sodium 141, EGFR 25-30 Care Everywhere results 01/22/2017 Creatinine 2.90, BUN 53, Potassium 4.5, Sodium 140, EGFR 21-25 Care Everywhere results 01/18/2017 Creatinine 2.66, BUN 44, Potassium 4.0, Sodium 143 Care Everywhere results 01/13/2017 Creatinine 2.81, BUN 47, Potassium 4.1, Sodium 141 Care Everywhere results 12/23/2016 Creatinine 2.97, BUN 35, Potassium 4.1, Sodium 142, EGFR 18-21  12/14/2016 Creatinine 2.74, BUN 37, Potassium 4.1, Sodium 140, EGFR 20-23 12/02/2016 Creatinine 2.40, BUN 44, Potassium 4.9, Sodium 139 Care Everywhere results 11/16/2016 Creatinine 2.23, BUN 35, Potassium 4.1, Sodium 136, Care Everywhere results 11/11/2016 Creatinine 2.29, BUN 38, Potassium 4.0, Sodium 136, EGFR 25-28 11/08/2016 Creatinine 2.23, BUN 37, Potassium 3.8, Sodium 136, EGFR 19-23 08/25/2016 Creatinine 1.90, BUN 44, Potassium 3.6, Sodium 135, EGFR 34-41 Care Everywhere 08/12/2016 Creatinine 2.78, BUN 37, Potassium 3.9. Sodium 136, EGFR 19-23 08/09/2016 Creatinine 2.62, BUN 37, Potassium 3.9, Sodium 138, EGFR 21-24 07/05/2016 Creatinine 2.30, BUN 29, Potassium 3.8, Sodium 139, EGFR 24-28 06/17/2016 Creatinine 2.21, BUN 32, Potassium 4.0, Sodium 137, EGFR  26-30 06/10/2016 Creatinine 2.37, BUN 35, Potassium 4.2, Sodium 135, EGFR 24-27 01/12/2018Creatinine 1.56, BUN 30, Potassium 3.6, Sodium 139, EGFR 39-45  01/11/2018Creatinine 1.51, BUN 32, Potassium 3.9, Sodium 140, EGFR 41-47  01/10/2018Creatinine 1.47, BUN 34, Potassium 3.6, Sodium 140, EGFR 42-49  05/18/2016 Creatinine 1.90, BUN 46, Potassium 3.4, Sodium 143, EGFR 31-36  01/08/2018Creatinine 2.49, BUN 55, Potassium 3.4, Sodium 141, EGFR 22-26   Recommendations: No changes.   Encouraged to call for fluid symptoms.  Follow-up plan: ICM clinic phone appointment on 03/01/2017.  [facility]  Copy of ICM check sent to Dr. BHaroldine Lawsand Dr. ARayann Heman   3 month ICM trend: 02/10/2017   1 Year ICM trend:      LRosalene Billings RN 02/10/2017 10:02 AM

## 2017-02-17 ENCOUNTER — Telehealth: Payer: Self-pay

## 2017-02-17 NOTE — Telephone Encounter (Signed)
Returned patient call as requested by voice mail message. Patient wanted to know date of next ICM remote transmission and advised on 03/01/2017.  He stated he is feeling fine right now.

## 2017-03-01 ENCOUNTER — Telehealth: Payer: Self-pay

## 2017-03-01 ENCOUNTER — Ambulatory Visit (INDEPENDENT_AMBULATORY_CARE_PROVIDER_SITE_OTHER): Payer: Self-pay

## 2017-03-01 DIAGNOSIS — Z95810 Presence of automatic (implantable) cardiac defibrillator: Secondary | ICD-10-CM

## 2017-03-01 DIAGNOSIS — I5022 Chronic systolic (congestive) heart failure: Secondary | ICD-10-CM

## 2017-03-01 NOTE — Telephone Encounter (Signed)
Spoke with pt and reminded pt of remote transmission that is due today. Pt verbalized understanding.   

## 2017-03-01 NOTE — Progress Notes (Signed)
EPIC Encounter for ICM Monitoring  Patient Name: Scott Parsons is a 83 y.o. male Date: 03/01/2017 Primary Care Physican: [name], MD Primary Cardiologist:Berry/Bensimhon Electrophysiologist: Allred Dry Weight: 153.9 lbs  Bi-V Pacing:   99%       Heart Failure questions reviewed, pt symptomatic with swollen feet and ankles and weight gain of a few pounds.  He is aware he has some fluid accumulation and will follow up with [facility] cardiology on Thursday, 10/25.   Thoracic impedance abnormal suggesting fluid accumulation since 02/08/2017.  Prescribed dosage: Bumetanide '1mg'$  take 1 tab twice a day. Potassium 20 mEq 1tabletdaily. He has been taking an extra Bumex tablet for the past week and [facility] cardiology is aware.  Labs:  01/25/2017 Creatinine 2.45, BUN 46, Potassium 3.9, Sodium 140, EGFR 25-31 Care Everywhere results 01/23/2017 Creatinine 2.50, BUN 52, Potassium 3.9, Sodium 141, EGFR 25-30 Care Everywhere results 01/22/2017 Creatinine 2.90, BUN 53, Potassium 4.5, Sodium 140, EGFR 21-25 Care Everywhere results 01/18/2017 Creatinine 2.66, BUN 44, Potassium 4.0, Sodium 143 Care Everywhere results 01/13/2017 Creatinine 2.81, BUN 47, Potassium 4.1, Sodium 141 Care Everywhere results 12/23/2016 Creatinine 2.97, BUN 35, Potassium 4.1, Sodium 142, EGFR 18-21  12/14/2016 Creatinine 2.74, BUN 37, Potassium 4.1, Sodium 140, EGFR 20-23 12/02/2016 Creatinine 2.40, BUN 44, Potassium 4.9, Sodium 139 Care Everywhere results 11/16/2016 Creatinine 2.23, BUN 35, Potassium 4.1, Sodium 136, Care Everywhere results 11/11/2016 Creatinine 2.29, BUN 38, Potassium 4.0, Sodium 136, EGFR 25-28 11/08/2016 Creatinine 2.23, BUN 37, Potassium 3.8, Sodium 136, EGFR 19-23 08/25/2016 Creatinine 1.90, BUN 44, Potassium 3.6, Sodium 135, EGFR 34-41 Care Everywhere 08/12/2016 Creatinine 2.78, BUN 37, Potassium 3.9. Sodium 136, EGFR 19-23 08/09/2016 Creatinine 2.62, BUN 37, Potassium 3.9, Sodium 138, EGFR  21-24 07/05/2016 Creatinine 2.30, BUN 29, Potassium 3.8, Sodium 139, EGFR 24-28 06/17/2016 Creatinine 2.21, BUN 32, Potassium 4.0, Sodium 137, EGFR 26-30 06/10/2016 Creatinine 2.37, BUN 35, Potassium 4.2, Sodium 135, EGFR 24-27  Recommendations:  Patient will discuss fluid accumulation with [facility] cardiology on 10/25  Follow-up plan: ICM clinic phone appointment on 03/07/2017.    Copy of ICM check sent to Dr. [name] and Dr. [name].   3 month ICM trend: 03/01/2017   1 Year ICM trend:      Rosalene Billings, RN 03/01/2017 11:41 AM

## 2017-03-08 ENCOUNTER — Ambulatory Visit (INDEPENDENT_AMBULATORY_CARE_PROVIDER_SITE_OTHER): Payer: Medicare Other

## 2017-03-08 ENCOUNTER — Ambulatory Visit (INDEPENDENT_AMBULATORY_CARE_PROVIDER_SITE_OTHER): Payer: Medicare Other | Admitting: *Deleted

## 2017-03-08 DIAGNOSIS — I428 Other cardiomyopathies: Secondary | ICD-10-CM

## 2017-03-08 DIAGNOSIS — I5022 Chronic systolic (congestive) heart failure: Secondary | ICD-10-CM

## 2017-03-08 DIAGNOSIS — Z95810 Presence of automatic (implantable) cardiac defibrillator: Secondary | ICD-10-CM

## 2017-03-08 NOTE — Progress Notes (Signed)
EPIC Encounter for ICM Monitoring  Patient Name: Scott Parsons is a 83 y.o. male Date: 03/08/2017 Primary Care Physican: [name], MD Primary Cardiologist:Berry/Bensimhon and [facility] Cardiology Electrophysiologist: Allred Dry Weight: 152.8 lbs  Bi-V Pacing:   99%             Heart Failure questions reviewed, pt asymptomatic since taking Metolazone as directed by [facility] Cardiology 03/07/2017.  He lost 1.8 after taking Metolazone.    Thoracic impedance close to baseline.  Prescribed dosage: Bumetanide 83m take 1 tab twice a day. Potassium 20 mEq 1tabletdaily. He has been taking an extra Bumex tablet for the past week and [facility]  Labs:  03/03/2017 Creatinine 2.77, BUN 56, Potassium 4.6, Sodium 135 Care Everywhere results 01/25/2017 Creatinine 2.45, BUN 46, Potassium 3.9, Sodium 140, EGFR 25-31 Care Everywhere results 01/23/2017 Creatinine 2.50, BUN 52, Potassium 3.9, Sodium 141, EGFR 25-30Care Everywhere results 01/22/2017 Creatinine 2.90, BUN 53, Potassium 4.5, Sodium 140, EGFR 21-25Care Everywhere results 01/18/2017 Creatinine 2.66, BUN 44, Potassium 4.0, Sodium 143 Care Everywhere results 01/13/2017 Creatinine 2.81, BUN 47, Potassium 4.1, Sodium 141 Care Everywhere results 12/23/2016 Creatinine 2.97, BUN 35, Potassium 4.1, Sodium 142, EGFR 18-21  12/14/2016 Creatinine 2.74, BUN 37, Potassium 4.1, Sodium 140, EGFR 20-23 12/02/2016 Creatinine 2.40, BUN 44, Potassium 4.9, Sodium 139 Care Everywhere results 11/16/2016 Creatinine 2.23, BUN 35, Potassium 4.1, Sodium 136, Care Everywhere results 11/11/2016 Creatinine 2.29, BUN 38, Potassium 4.0, Sodium 136, EGFR 25-28 11/08/2016 Creatinine 2.23, BUN 37, Potassium 3.8, Sodium 136, EGFR 19-23 08/25/2016 Creatinine 1.90, BUN 44, Potassium 3.6, Sodium 135, EGFR 34-41 Care Everywhere 08/12/2016 Creatinine 2.78, BUN 37, Potassium 3.9. Sodium 136, EGFR 19-23 08/09/2016 Creatinine 2.62, BUN 37, Potassium 3.9, Sodium 138,  EGFR 21-24 07/05/2016 Creatinine 2.30, BUN 29, Potassium 3.8, Sodium 139, EGFR 24-28 06/17/2016 Creatinine 2.21, BUN 32, Potassium 4.0, Sodium 137, EGFR 26-30 06/10/2016 Creatinine 2.37, BUN 35, Potassium 4.2, Sodium 135, EGFR 24-27   Recommendations: No changes.  Encouraged to call for fluid symptoms.  Follow-up plan: ICM clinic phone appointment on 03/22/2017. (manual send).  [facility]  Copy of ICM check sent to Dr. ARayann Hemanand Dr. BHaroldine Laws   3 month ICM trend: 03/08/2017   1 Year ICM trend:      LRosalene Billings RN 03/08/2017 8:02 AM

## 2017-03-10 NOTE — Progress Notes (Signed)
Remote ICD transmission.   

## 2017-03-11 LAB — CUP PACEART REMOTE DEVICE CHECK
Battery Remaining Longevity: 34 mo
Brady Statistic AP VS Percent: 0.1 %
Brady Statistic RA Percent Paced: 92.61 %
Date Time Interrogation Session: 20181030082305
HIGH POWER IMPEDANCE MEASURED VALUE: 56 Ohm
Implantable Lead Implant Date: 20150507
Implantable Lead Location: 753858
Implantable Lead Location: 753859
Implantable Pulse Generator Implant Date: 20150507
Lead Channel Impedance Value: 304 Ohm
Lead Channel Impedance Value: 361 Ohm
Lead Channel Impedance Value: 437 Ohm
Lead Channel Impedance Value: 494 Ohm
Lead Channel Impedance Value: 513 Ohm
Lead Channel Impedance Value: 513 Ohm
Lead Channel Pacing Threshold Pulse Width: 0.4 ms
Lead Channel Pacing Threshold Pulse Width: 0.4 ms
Lead Channel Sensing Intrinsic Amplitude: 9.25 mV
Lead Channel Setting Pacing Amplitude: 2 V
Lead Channel Setting Pacing Amplitude: 2.5 V
Lead Channel Setting Pacing Pulse Width: 0.4 ms
Lead Channel Setting Pacing Pulse Width: 0.4 ms
MDC IDC LEAD IMPLANT DT: 20150507
MDC IDC LEAD IMPLANT DT: 20150507
MDC IDC LEAD LOCATION: 753860
MDC IDC MSMT BATTERY VOLTAGE: 2.94 V
MDC IDC MSMT LEADCHNL LV IMPEDANCE VALUE: 304 Ohm
MDC IDC MSMT LEADCHNL LV IMPEDANCE VALUE: 304 Ohm
MDC IDC MSMT LEADCHNL LV IMPEDANCE VALUE: 304 Ohm
MDC IDC MSMT LEADCHNL LV IMPEDANCE VALUE: 494 Ohm
MDC IDC MSMT LEADCHNL LV IMPEDANCE VALUE: 513 Ohm
MDC IDC MSMT LEADCHNL LV PACING THRESHOLD AMPLITUDE: 1.875 V
MDC IDC MSMT LEADCHNL RA IMPEDANCE VALUE: 399 Ohm
MDC IDC MSMT LEADCHNL RA PACING THRESHOLD AMPLITUDE: 0.5 V
MDC IDC MSMT LEADCHNL RA SENSING INTR AMPL: 2.125 mV
MDC IDC MSMT LEADCHNL RA SENSING INTR AMPL: 2.125 mV
MDC IDC MSMT LEADCHNL RV IMPEDANCE VALUE: 342 Ohm
MDC IDC MSMT LEADCHNL RV PACING THRESHOLD AMPLITUDE: 0.625 V
MDC IDC MSMT LEADCHNL RV PACING THRESHOLD PULSEWIDTH: 0.4 ms
MDC IDC MSMT LEADCHNL RV SENSING INTR AMPL: 9.25 mV
MDC IDC SET LEADCHNL LV PACING AMPLITUDE: 3 V
MDC IDC SET LEADCHNL RV SENSING SENSITIVITY: 0.3 mV
MDC IDC STAT BRADY AP VP PERCENT: 92.94 %
MDC IDC STAT BRADY AS VP PERCENT: 6.83 %
MDC IDC STAT BRADY AS VS PERCENT: 0.13 %
MDC IDC STAT BRADY RV PERCENT PACED: 99.04 %

## 2017-03-16 ENCOUNTER — Encounter: Payer: Self-pay | Admitting: Cardiology

## 2017-03-22 ENCOUNTER — Ambulatory Visit (INDEPENDENT_AMBULATORY_CARE_PROVIDER_SITE_OTHER): Payer: Self-pay

## 2017-03-22 DIAGNOSIS — Z95810 Presence of automatic (implantable) cardiac defibrillator: Secondary | ICD-10-CM

## 2017-03-22 DIAGNOSIS — I5022 Chronic systolic (congestive) heart failure: Secondary | ICD-10-CM

## 2017-03-22 NOTE — Progress Notes (Signed)
EPIC Encounter for ICM Monitoring  Patient Name: Scott Parsons is a 83 y.o. male Date: 03/22/2017 Primary Care Physican: [name], MD Primary Cardiologist:Berry/Bensimhon and [facility] Cardiology Electrophysiologist: Allred Dry Weight:155.4lbs  Bi-V Pacing: 99.1%      Heart Failure questions reviewed, pt reported weight fluctuation of 3 - 4 pounds and has a little swelling in his feet.   Optivol: Thoracic impedance abnormal suggesting fluid accumulation trending just below baseline.  Prescribed dosage: Bumetanide '1mg'$  take 1 tab twice a day. Potassium 20 mEq 1tabletdaily.   Labs:  03/10/2017 Creatinine 3.40, Bun 72,  Potassium 4.0, Sodium 134 Care Everywhere results 03/03/2017 Creatinine 2.77, BUN 56, Potassium 4.6, Sodium 135 Care Everywhere results 01/25/2017 Creatinine 2.45, BUN 46, Potassium 3.9, Sodium 140, EGFR 25-31 Care Everywhere results 01/23/2017 Creatinine 2.50, BUN 52, Potassium 3.9, Sodium 141, EGFR 25-30Care Everywhere results 01/22/2017 Creatinine 2.90, BUN 53, Potassium 4.5, Sodium 140, EGFR 21-25Care Everywhere results 01/18/2017 Creatinine 2.66, BUN 44, Potassium 4.0, Sodium 143 Care Everywhere results 01/13/2017 Creatinine 2.81, BUN 47, Potassium 4.1, Sodium 141 Care Everywhere results 12/23/2016 Creatinine 2.97, BUN 35, Potassium 4.1, Sodium 142, EGFR 18-21  12/14/2016 Creatinine 2.74, BUN 37, Potassium 4.1, Sodium 140, EGFR 20-23 12/02/2016 Creatinine 2.40, BUN 44, Potassium 4.9, Sodium 139 Care Everywhere results 11/16/2016 Creatinine 2.23, BUN 35, Potassium 4.1, Sodium 136, Care Everywhere results 11/11/2016 Creatinine 2.29, BUN 38, Potassium 4.0, Sodium 136, EGFR 25-28 11/08/2016 Creatinine 2.23, BUN 37, Potassium 3.8, Sodium 136, EGFR 19-23 08/25/2016 Creatinine 1.90, BUN 44, Potassium 3.6, Sodium 135, EGFR 34-41 Care Everywhere 08/12/2016 Creatinine 2.78, BUN 37, Potassium 3.9. Sodium 136, EGFR 19-23 08/09/2016 Creatinine 2.62, BUN 37,  Potassium 3.9, Sodium 138, EGFR 21-24       A complete set of results can be found in Results Review.  Recommendations: No changes.  Encouraged to call for fluid symptoms.  Follow-up plan: ICM clinic phone appointment on 04/22/2017.  Patient being monitored by [facility]iology and next appointment is 03/24/2017  Copy of ICM check sent to Dr. [name] and Dr. [name].   3 month ICM trend: 03/22/2017    1 Year ICM trend:       Rosalene Billings, RN 03/22/2017 11:09 AM

## 2017-04-22 ENCOUNTER — Ambulatory Visit (INDEPENDENT_AMBULATORY_CARE_PROVIDER_SITE_OTHER): Payer: Medicare Other

## 2017-04-22 DIAGNOSIS — I5022 Chronic systolic (congestive) heart failure: Secondary | ICD-10-CM | POA: Diagnosis not present

## 2017-04-22 DIAGNOSIS — Z95810 Presence of automatic (implantable) cardiac defibrillator: Secondary | ICD-10-CM

## 2017-04-22 NOTE — Progress Notes (Signed)
EPIC Encounter for ICM Monitoring  Patient Name: Scott Parsons is a 83 y.o. male Date: 04/22/2017 Primary Care Physican: [name], MD Primary Cardiologist:Berry/Bensimhonand [facility] Cardiology Electrophysiologist: Allred Dry Weight:161lbs  Bi-V Pacing: 99.1%      Heart Failure questions reviewed, pt has some chronic ankle swelling.  Weight has increased 5 lbs in last month.   [facility] Cardiology is aware of symptoms.    Thoracic impedance abnormal suggesting fluid accumulation and starting to trend toward baseline. He continues to go to follow up with [facility] cardiology.   Prescribed dosage: Bumetanide '1mg'$  take 1 tab twice a day and he is taking differently, he is taking Bumex 1 mg taking 1 tablet tid and [facility] Cardiology is aware.  Potassium 20 mEq 1tabletdaily.   Labs: 03/24/2017 Creatinine 3.22, BUN 56, Potassium 5.1, Sodium 141 Care Everywhere results 03/10/2017 Creatinine 3.40, Bun 72,  Potassium 4.0, Sodium 134 Care Everywhere results 03/03/2017 Creatinine 2.77, BUN 56, Potassium 4.6, Sodium 135 Care Everywhere results 01/25/2017 Creatinine 2.45, BUN 46, Potassium 3.9, Sodium 140, EGFR 25-31 Care Everywhere results 01/23/2017 Creatinine 2.50, BUN 52, Potassium 3.9, Sodium 141, EGFR 25-30Care Everywhere results 01/22/2017 Creatinine 2.90, BUN 53, Potassium 4.5, Sodium 140, EGFR 21-25Care Everywhere results 01/18/2017 Creatinine 2.66, BUN 44, Potassium 4.0, Sodium 143 Care Everywhere results 01/13/2017 Creatinine 2.81, BUN 47, Potassium 4.1, Sodium 141 Care Everywhere results 12/23/2016 Creatinine 2.97, BUN 35, Potassium 4.1, Sodium 142, EGFR 18-21  12/14/2016 Creatinine 2.74, BUN 37, Potassium 4.1, Sodium 140, EGFR 20-23 12/02/2016 Creatinine 2.40, BUN 44, Potassium 4.9, Sodium 139 Care Everywhere results 11/16/2016 Creatinine 2.23, BUN 35, Potassium 4.1, Sodium 136, Care Everywhere results 11/11/2016 Creatinine 2.29, BUN 38, Potassium 4.0, Sodium 136, EGFR  25-28 11/08/2016 Creatinine 2.23, BUN 37, Potassium 3.8, Sodium 136, EGFR 19-23 08/25/2016 Creatinine 1.90, BUN 44, Potassium 3.6, Sodium 135, EGFR 34-41 Care Everywhere 08/12/2016 Creatinine 2.78, BUN 37, Potassium 3.9. Sodium 136, EGFR 19-23 08/09/2016 Creatinine 2.62, BUN 37, Potassium 3.9, Sodium 138, EGFR 21-24       A complete set of results can be found in Results Review.   Recommendations:  No changes.  Advised him to follow the latest instructions given to him by [facility] cardiology.   Use ER for any urgent symptoms of needed.   Follow-up plan: ICM clinic phone appointment on 05/23/2017.    Copy of ICM check sent to Dr. [name] and Dr. [name].   3 month ICM trend: 04/22/2017    1 Year ICM trend:       Rosalene Billings, RN 04/22/2017 11:55 AM

## 2017-05-07 ENCOUNTER — Other Ambulatory Visit (HOSPITAL_COMMUNITY): Payer: Self-pay | Admitting: Internal Medicine

## 2017-05-09 ENCOUNTER — Ambulatory Visit (HOSPITAL_COMMUNITY)
Admission: RE | Admit: 2017-05-09 | Discharge: 2017-05-09 | Disposition: A | Discharge status: Home / Self Care | Payer: Medicare Other | Source: Ambulatory Visit | Attending: Internal Medicine | Admitting: Internal Medicine

## 2017-05-09 DIAGNOSIS — I6523 Occlusion and stenosis of bilateral carotid arteries: Secondary | ICD-10-CM | POA: Diagnosis not present

## 2017-05-09 DIAGNOSIS — I739 Peripheral vascular disease, unspecified: Secondary | ICD-10-CM

## 2017-05-09 DIAGNOSIS — I779 Disorder of arteries and arterioles, unspecified: Secondary | ICD-10-CM

## 2017-05-19 ENCOUNTER — Other Ambulatory Visit: Payer: Self-pay | Admitting: Cardiovascular Disease

## 2017-05-19 DIAGNOSIS — I6523 Occlusion and stenosis of bilateral carotid arteries: Secondary | ICD-10-CM

## 2017-05-23 ENCOUNTER — Ambulatory Visit (INDEPENDENT_AMBULATORY_CARE_PROVIDER_SITE_OTHER): Payer: Medicare Other

## 2017-05-23 DIAGNOSIS — Z95810 Presence of automatic (implantable) cardiac defibrillator: Secondary | ICD-10-CM | POA: Diagnosis not present

## 2017-05-23 DIAGNOSIS — I509 Heart failure, unspecified: Secondary | ICD-10-CM | POA: Diagnosis not present

## 2017-05-24 NOTE — Progress Notes (Signed)
EPIC Encounter for ICM Monitoring  Patient Name: Scott Parsons is a 84 y.o. male Date: 05/24/2017 Primary Care Physican: [name], MD Primary Cardiologist:Berry/Bensimhonand [facility] Cardiology Electrophysiologist: Allred Dry Weight:166.1lbs  Bi-V Pacing: 99.2%       Heart Failure questions reviewed, pt asymptomatic but weight fluctuates.   Thoracic impedance continues to be abnormal suggesting fluid accumulation since October 2018.  Prescribed dosage: Bumetanide '1mg'$  take 1 tab twice a day and he is taking differently, he is taking Bumex 1 mg taking 1 tablet tid and [facility] Cardiology is aware.  Potassium 20 mEq 1tabletdaily.   Labs: 03/24/2017 Creatinine 3.22, BUN 56, Potassium 5.1, Sodium 141 Care Everywhere results 03/10/2017 Creatinine 3.40, Bun 72, Potassium 4.0, Sodium 134 Care Everywhere results 03/03/2017 Creatinine 2.77, BUN 56, Potassium 4.6, Sodium 135 Care Everywhere results 01/25/2017 Creatinine 2.45, BUN 46, Potassium 3.9, Sodium 140, EGFR 25-31 Care Everywhere results 01/23/2017 Creatinine 2.50, BUN 52, Potassium 3.9, Sodium 141, EGFR 25-30Care Everywhere results 01/22/2017 Creatinine 2.90, BUN 53, Potassium 4.5, Sodium 140, EGFR 21-25Care Everywhere results 01/18/2017 Creatinine 2.66, BUN 44, Potassium 4.0, Sodium 143 Care Everywhere results 01/13/2017 Creatinine 2.81, BUN 47, Potassium 4.1, Sodium 141 Care Everywhere results 12/23/2016 Creatinine 2.97, BUN 35, Potassium 4.1, Sodium 142, EGFR 18-21  12/14/2016 Creatinine 2.74, BUN 37, Potassium 4.1, Sodium 140, EGFR 20-23 12/02/2016 Creatinine 2.40, BUN 44, Potassium 4.9, Sodium 139 Care Everywhere results 11/16/2016 Creatinine 2.23, BUN 35, Potassium 4.1, Sodium 136, Care Everywhere results 11/11/2016 Creatinine 2.29, BUN 38, Potassium 4.0, Sodium 136, EGFR 25-28 11/08/2016 Creatinine 2.23, BUN 37, Potassium 3.8, Sodium 136, EGFR 19-23 08/25/2016 Creatinine 1.90, BUN 44, Potassium 3.6, Sodium 135, EGFR  34-41 Care Everywhere 08/12/2016 Creatinine 2.78, BUN 37, Potassium 3.9. Sodium 136, EGFR 19-23 08/09/2016 Creatinine 2.62, BUN 37, Potassium 3.9, Sodium 138, EGFR 21-24 A complete set of results can be found in Results Review.  Recommendations:  Patient being managed by [facility] Cardiology.  Follow-up plan: ICM clinic phone appointment on 06/23/2017.  Next [facility] cardiology appointment 05/26/2017  Copy of ICM check sent to Dr. [name] and Dr. [name].   3 month ICM trend: 05/23/2017    1 Year ICM trend:       Rosalene Billings, RN 05/24/2017 11:39 AM

## 2017-05-29 ENCOUNTER — Other Ambulatory Visit (HOSPITAL_COMMUNITY): Payer: Self-pay | Admitting: Internal Medicine

## 2017-06-07 ENCOUNTER — Ambulatory Visit (INDEPENDENT_AMBULATORY_CARE_PROVIDER_SITE_OTHER): Payer: Medicare Other | Admitting: *Deleted

## 2017-06-07 DIAGNOSIS — I428 Other cardiomyopathies: Secondary | ICD-10-CM | POA: Diagnosis not present

## 2017-06-07 NOTE — Progress Notes (Signed)
Remote ICD transmission.   

## 2017-06-09 ENCOUNTER — Encounter: Payer: Self-pay | Admitting: Cardiology

## 2017-06-09 ENCOUNTER — Other Ambulatory Visit (HOSPITAL_COMMUNITY): Payer: Self-pay | Admitting: *Deleted

## 2017-06-09 MED ORDER — BUMETANIDE 1 MG PO TABS: 1.0000 mg | ORAL_TABLET | Freq: Two times a day (BID) | ORAL | 3 refills | Status: AC

## 2017-06-20 ENCOUNTER — Other Ambulatory Visit (HOSPITAL_COMMUNITY): Payer: Self-pay | Admitting: Internal Medicine

## 2017-06-22 LAB — CUP PACEART REMOTE DEVICE CHECK
Battery Voltage: 2.95 V
Brady Statistic AP VP Percent: 83.14 %
Brady Statistic AP VS Percent: 0.15 %
Brady Statistic AS VP Percent: 16.39 %
Brady Statistic AS VS Percent: 0.32 %
Brady Statistic RV Percent Paced: 98.17 %
HighPow Impedance: 52 Ohm
Implantable Lead Implant Date: 20150507
Implantable Lead Location: 753860
Implantable Lead Model: 4598
Implantable Lead Model: 5076
Implantable Lead Model: 6935
Lead Channel Impedance Value: 304 Ohm
Lead Channel Impedance Value: 304 Ohm
Lead Channel Impedance Value: 304 Ohm
Lead Channel Impedance Value: 361 Ohm
Lead Channel Impedance Value: 399 Ohm
Lead Channel Impedance Value: 494 Ohm
Lead Channel Impedance Value: 551 Ohm
Lead Channel Impedance Value: 551 Ohm
Lead Channel Pacing Threshold Amplitude: 0.5 V
Lead Channel Pacing Threshold Amplitude: 0.625 V
Lead Channel Pacing Threshold Amplitude: 1.625 V
Lead Channel Pacing Threshold Pulse Width: 0.4 ms
Lead Channel Sensing Intrinsic Amplitude: 1.125 mV
Lead Channel Sensing Intrinsic Amplitude: 1.125 mV
Lead Channel Sensing Intrinsic Amplitude: 7.625 mV
Lead Channel Setting Pacing Amplitude: 2.75 V
Lead Channel Setting Pacing Pulse Width: 0.4 ms
Lead Channel Setting Sensing Sensitivity: 0.3 mV
MDC IDC LEAD IMPLANT DT: 20150507
MDC IDC LEAD IMPLANT DT: 20150507
MDC IDC LEAD LOCATION: 753858
MDC IDC LEAD LOCATION: 753859
MDC IDC MSMT BATTERY REMAINING LONGEVITY: 30 mo
MDC IDC MSMT LEADCHNL LV IMPEDANCE VALUE: 342 Ohm
MDC IDC MSMT LEADCHNL LV IMPEDANCE VALUE: 494 Ohm
MDC IDC MSMT LEADCHNL LV IMPEDANCE VALUE: 551 Ohm
MDC IDC MSMT LEADCHNL LV PACING THRESHOLD PULSEWIDTH: 0.4 ms
MDC IDC MSMT LEADCHNL RA PACING THRESHOLD PULSEWIDTH: 0.4 ms
MDC IDC MSMT LEADCHNL RV IMPEDANCE VALUE: 342 Ohm
MDC IDC MSMT LEADCHNL RV IMPEDANCE VALUE: 418 Ohm
MDC IDC MSMT LEADCHNL RV SENSING INTR AMPL: 7.625 mV
MDC IDC PG IMPLANT DT: 20150507
MDC IDC SESS DTM: 20190129113322
MDC IDC SET LEADCHNL LV PACING PULSEWIDTH: 0.4 ms
MDC IDC SET LEADCHNL RA PACING AMPLITUDE: 2 V
MDC IDC SET LEADCHNL RV PACING AMPLITUDE: 2.5 V
MDC IDC STAT BRADY RA PERCENT PACED: 82.04 %

## 2017-06-23 ENCOUNTER — Ambulatory Visit (INDEPENDENT_AMBULATORY_CARE_PROVIDER_SITE_OTHER): Payer: Medicare Other

## 2017-06-23 DIAGNOSIS — I509 Heart failure, unspecified: Secondary | ICD-10-CM | POA: Diagnosis not present

## 2017-06-23 DIAGNOSIS — Z95810 Presence of automatic (implantable) cardiac defibrillator: Secondary | ICD-10-CM

## 2017-06-23 NOTE — Progress Notes (Signed)
EPIC Encounter for ICM Monitoring  Patient Name: Scott Parsons is a 84 y.o. male Date: 06/23/2017 Primary Care Physican: [name], MD Primary Cardiologist:Berry/Bensimhonand [facility] Cardiology Electrophysiologist: Allred Dry Weight:162lbs  Bi-V Pacing: 98.1%          Heart Failure questions reviewed, pt asymptomatic.  Patient said [facility] has prescribed Metolazone and he takes it about every a couple weeks.   Thoracic impedance normal.  Prescribed dosage: Bumetanide '1mg'$  take 1 tab twice a dayand he is taking differently, he is taking Bumex 1 mg taking 1 tablet tid and [facility] Cardiology is aware.Potassium 20 mEq 1tabletdaily.   Labs: 03/24/2017 Creatinine 3.22, BUN 56, Potassium 5.1, Sodium 141 Care Everywhere results 03/10/2017 Creatinine 3.40, Bun 72, Potassium 4.0, Sodium 134 Care Everywhere results 03/03/2017 Creatinine 2.77, BUN 56, Potassium 4.6, Sodium 135 Care Everywhere results 01/25/2017 Creatinine 2.45, BUN 46, Potassium 3.9, Sodium 140, EGFR 25-31 Care Everywhere results 01/23/2017 Creatinine 2.50, BUN 52, Potassium 3.9, Sodium 141, EGFR 25-30Care Everywhere results 01/22/2017 Creatinine 2.90, BUN 53, Potassium 4.5, Sodium 140, EGFR 21-25Care Everywhere results 01/18/2017 Creatinine 2.66, BUN 44, Potassium 4.0, Sodium 143 Care Everywhere results 01/13/2017 Creatinine 2.81, BUN 47, Potassium 4.1, Sodium 141 Care Everywhere results 12/23/2016 Creatinine 2.97, BUN 35, Potassium 4.1, Sodium 142, EGFR 18-21  12/14/2016 Creatinine 2.74, BUN 37, Potassium 4.1, Sodium 140, EGFR 20-23 12/02/2016 Creatinine 2.40, BUN 44, Potassium 4.9, Sodium 139 Care Everywhere results 11/16/2016 Creatinine 2.23, BUN 35, Potassium 4.1, Sodium 136, Care Everywhere results 11/11/2016 Creatinine 2.29, BUN 38, Potassium 4.0, Sodium 136, EGFR 25-28 11/08/2016 Creatinine 2.23, BUN 37, Potassium 3.8, Sodium 136, EGFR 19-23 08/25/2016 Creatinine 1.90, BUN 44, Potassium 3.6, Sodium 135, EGFR  34-41 Care Everywhere 08/12/2016 Creatinine 2.78, BUN 37, Potassium 3.9. Sodium 136, EGFR 19-23 08/09/2016 Creatinine 2.62, BUN 37, Potassium 3.9, Sodium 138, EGFR 21-24 A complete set of results can be found in Results Review.  Recommendations:  Patient being managed by [facility].  Follow-up plan: ICM clinic phone appointment on 07/25/2017.    Copy of ICM check sent to Dr. [name].   3 month ICM trend: 06/23/2017    1 Year ICM trend:       Rosalene Billings, RN 06/23/2017 11:45 AM

## 2017-07-25 ENCOUNTER — Ambulatory Visit (INDEPENDENT_AMBULATORY_CARE_PROVIDER_SITE_OTHER): Payer: Medicare Other

## 2017-07-25 DIAGNOSIS — I509 Heart failure, unspecified: Secondary | ICD-10-CM

## 2017-07-25 DIAGNOSIS — Z95810 Presence of automatic (implantable) cardiac defibrillator: Secondary | ICD-10-CM

## 2017-07-25 NOTE — Progress Notes (Signed)
EPIC Encounter for ICM Monitoring  Patient Name: Scott Parsons is a 84 y.o. male Date: 07/25/2017 Primary Care Physican: [name], MD Primary Cardiologist:Berry/Bensimhon Heart Failure: [facility] Cardiology Electrophysiologist: Allred Dry Weight: Previous weight 162lbs  Bi-V Pacing: 98.4%      Spoke with wife, patient was not home.  She reported patient has been having an increase in SOB for at least 2 weeks which correlates with decreased impedance.   Thoracic impedance abnormal suggesting fluid accumulation.  Prescribed dosage: Bumetanide '1mg'$  take 1 tab twice a dayand he is taking differently, he is taking Bumex 1 mg taking 1 tablet tid and [facility] Cardiology is aware.Potassium 20 mEq 1tabletdaily.   Labs: 05/26/2017 Creatinine 3.42, BUN 53, Potassium 4.9, Sodium 141, EGFR 17-21 Care Everywhere results 03/24/2017 Creatinine 3.22, BUN 56, Potassium 5.1, Sodium 141 Care Everywhere results 03/10/2017 Creatinine 3.40, Bun 72, Potassium 4.0, Sodium 134 Care Everywhere results 03/03/2017 Creatinine 2.77, BUN 56, Potassium 4.6, Sodium 135 Care Everywhere results 01/25/2017 Creatinine 2.45, BUN 46, Potassium 3.9, Sodium 140, EGFR 25-31 Care Everywhere results 01/23/2017 Creatinine 2.50, BUN 52, Potassium 3.9, Sodium 141, EGFR 25-30Care Everywhere results 01/22/2017 Creatinine 2.90, BUN 53, Potassium 4.5, Sodium 140, EGFR 21-25Care Everywhere results 01/18/2017 Creatinine 2.66, BUN 44, Potassium 4.0, Sodium 143 Care Everywhere results 01/13/2017 Creatinine 2.81, BUN 47, Potassium 4.1, Sodium 141 Care Everywhere results 12/23/2016 Creatinine 2.97, BUN 35, Potassium 4.1, Sodium 142, EGFR 18-21  12/14/2016 Creatinine 2.74, BUN 37, Potassium 4.1, Sodium 140, EGFR 20-23 12/02/2016 Creatinine 2.40, BUN 44, Potassium 4.9, Sodium 139 Care Everywhere results 11/16/2016 Creatinine 2.23, BUN 35, Potassium 4.1, Sodium 136, Care Everywhere results 11/11/2016 Creatinine 2.29, BUN 38,  Potassium 4.0, Sodium 136, EGFR 25-28 11/08/2016 Creatinine 2.23, BUN 37, Potassium 3.8, Sodium 136, EGFR 19-23 08/25/2016 Creatinine 1.90, BUN 44, Potassium 3.6, Sodium 135, EGFR 34-41 Care Everywhere 08/12/2016 Creatinine 2.78, BUN 37, Potassium 3.9. Sodium 136, EGFR 19-23 08/09/2016 Creatinine 2.62, BUN 37, Potassium 3.9, Sodium 138, EGFR 21-24 A complete set of results can be found in Results Review.  Recommendations: [facility] Cardiology is managing Heart Failure.  Advised wife to have patient contact [facility] Cardiology for recommendations.   Follow-up plan: ICM clinic phone appointment on 07/29/2017 to recheck fluid levels.  Office appointment scheduled 08/01/2017 with [facility] Cardiology.  Copy of ICM check sent to Dr. [name] and Dr [name].   3 month ICM trend: 07/25/2017    1 Year ICM trend:       Rosalene Billings, RN 07/25/2017 2:26 PM

## 2017-07-29 ENCOUNTER — Ambulatory Visit (INDEPENDENT_AMBULATORY_CARE_PROVIDER_SITE_OTHER): Payer: Self-pay

## 2017-07-29 ENCOUNTER — Telehealth: Payer: Self-pay

## 2017-07-29 DIAGNOSIS — I509 Heart failure, unspecified: Secondary | ICD-10-CM

## 2017-07-29 DIAGNOSIS — Z95810 Presence of automatic (implantable) cardiac defibrillator: Secondary | ICD-10-CM

## 2017-07-29 NOTE — Progress Notes (Signed)
EPIC Encounter for ICM Monitoring  Patient Name: Scott Parsons is a 84 y.o. male Date: 07/29/2017 Primary Care Physican: [name], MD Primary Cardiologist:Berry/Bensimhon Heart Failure: [facility] Cardiology Electrophysiologist: Allred Dry Weight: 170lbs on 3/20 per [facility] note Bi-V Pacing: 98.4%          Attempted call to patient and unable to reach.  Left detailed message regarding transmission.  Transmission reviewed.  Per [facility] note 07/27/17, patient was instructed to take Bumetanide 4 mg twice a day x 4 days in response to weight gain   Thoracic impedance almost at baseline normal with the increase in Bumetanide by [facility].  Prescribed dosage: Per [facility] notes patient should take Bumetanide 1 tablet (1 mg total) by mouth Two (2) times a day. And 1 mg prn weight gain and leg swellingPotassium 20 mEq 1tabletdaily.   Recommendations: Left voice mail with ICM number and encouraged to call if experiencing any fluid symptoms.  Follow-up plan: ICM clinic phone appointment on 08/25/2017.    Copy of ICM check sent to Dr. Johney Frame.   3 month ICM trend: 07/29/2017    1 Year ICM trend:       Karie Soda, RN 07/29/2017 9:51 AM

## 2017-07-29 NOTE — Telephone Encounter (Signed)
Remote ICM transmission received.  Attempted call to patient and left detailed message per DPR regarding transmission and next ICM scheduled for 08/25/2017.  Advised to return call for any fluid symptoms or questions.    

## 2017-08-25 ENCOUNTER — Telehealth: Payer: Self-pay | Admitting: Cardiology

## 2017-08-25 NOTE — Telephone Encounter (Signed)
LMOVM reminding pt to send remote transmission.   

## 2017-08-26 NOTE — Progress Notes (Signed)
No ICM remote transmission received for 08/25/2017 and next ICM transmission scheduled for 09/06/2017.

## 2017-09-06 ENCOUNTER — Telehealth: Payer: Self-pay | Admitting: Cardiology

## 2017-09-06 ENCOUNTER — Encounter: Payer: Medicare Other | Admitting: *Deleted

## 2017-09-06 NOTE — Telephone Encounter (Signed)
LMOVM reminding pt to send remote transmission.   

## 2017-09-07 ENCOUNTER — Telehealth: Payer: Self-pay | Admitting: Internal Medicine

## 2017-09-07 NOTE — Telephone Encounter (Signed)
New message:      Pt's wife states that pt has now been in the hosp for 3 wks and will be unable to send any remote checks until her returns home.

## 2017-09-07 NOTE — Telephone Encounter (Signed)
Remote rescheduled for 10/10/17, wife aware.  Instructed wife to call if patient's unable to send on 6/3. Wife verbalized understanding. Will also forward information to ICM clinic.

## 2017-09-08 ENCOUNTER — Encounter: Payer: Self-pay | Admitting: Cardiology

## 2017-10-08 DEATH — deceased

## 2018-02-07 IMAGING — CR DG CHEST 1V PORT
2 series · 2 of 2 positions shown · non-contrast
Comparison: Chest radiograph performed 11/11/2015

CLINICAL DATA: Follow-up pulmonary edema.  Subsequent encounter.

EXAM:
PORTABLE CHEST 1 VIEW

[AP (1 of 2)]
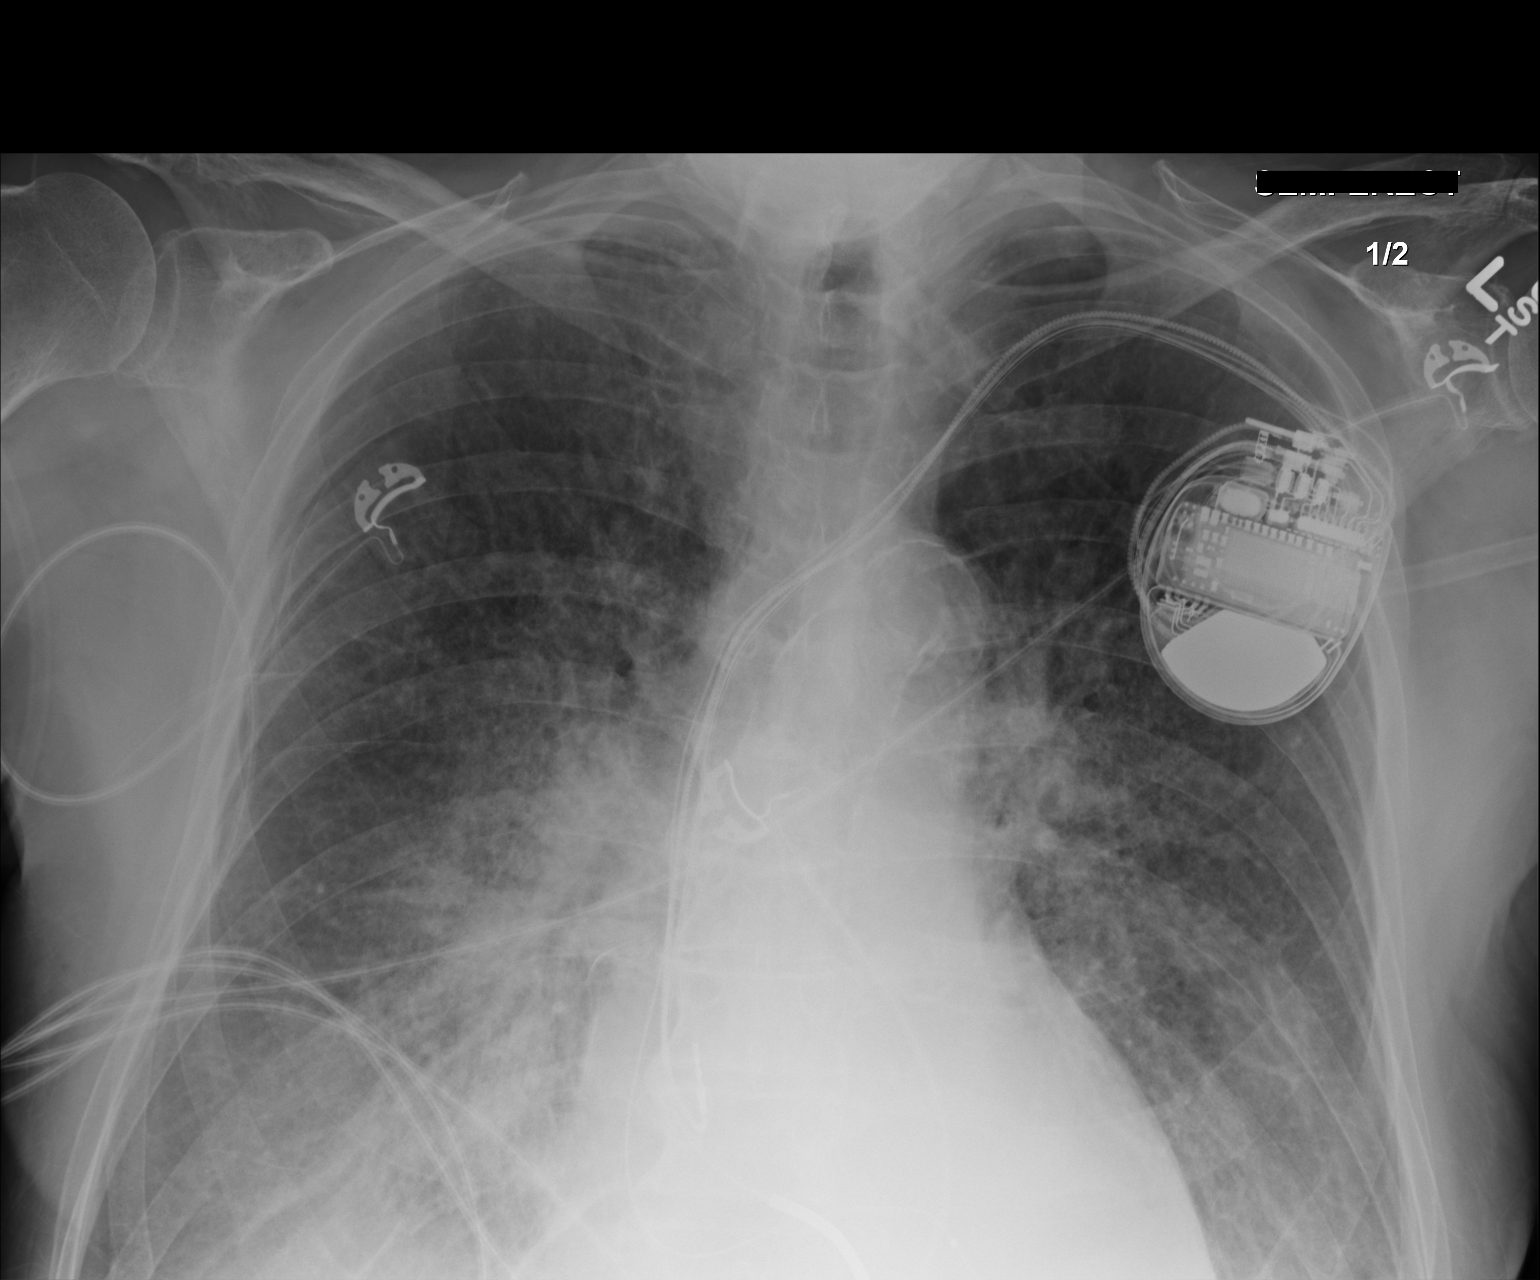

[AP (2 of 2)]
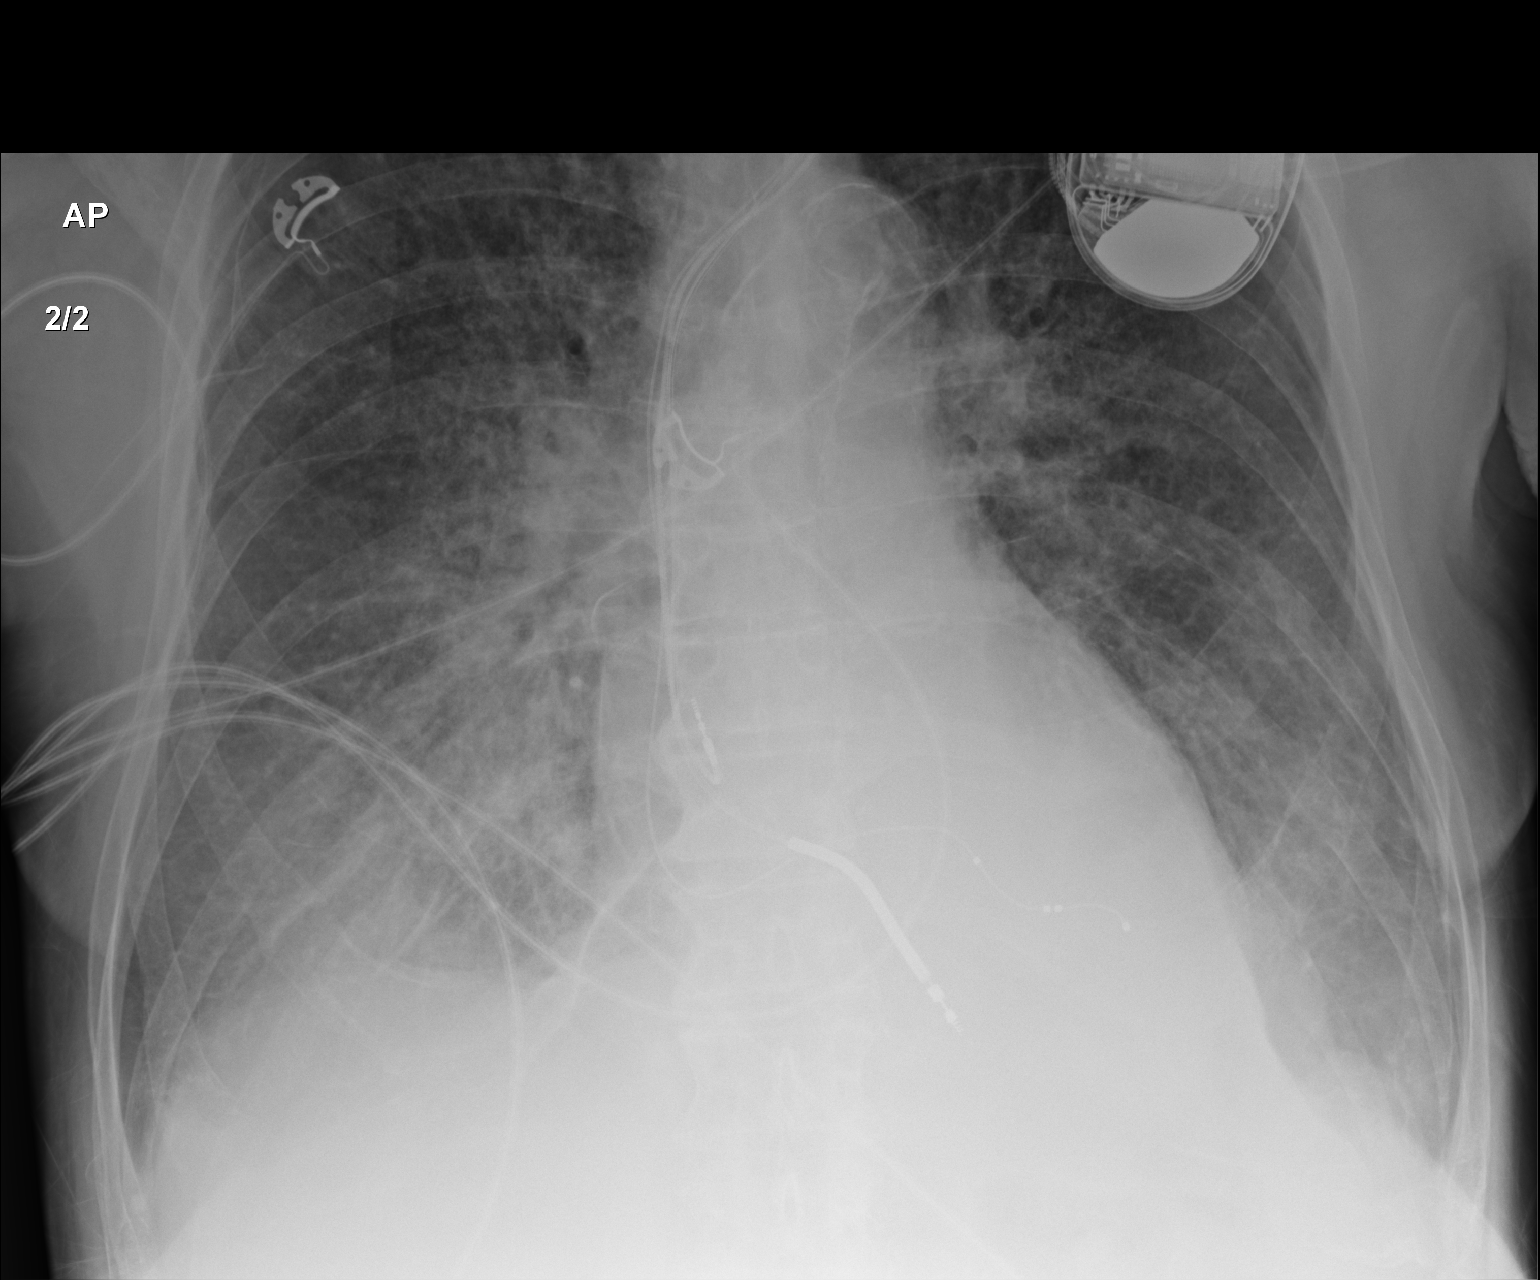

[2 of 2 positions shown; findings below may reference images not displayed]

FINDINGS: Bilateral central airspace opacification, right greater than left,
is improved from the prior study. This remains compatible with
pulmonary edema. Small bilateral pleural effusions are suspected. No
pneumothorax is seen.

The cardiomediastinal silhouette is normal in size. A pacemaker/AICD
is noted overlying the left chest wall, with leads ending overlying
the right atrium, right ventricle and coronary sinus. No acute
osseous abnormalities are seen.
IMPRESSION: Interval improvement in bilateral central airspace opacification,
right greater than left, reflecting improving pulmonary edema. Small
bilateral pleural effusions suspected.

## 2018-08-11 IMAGING — CR DG CHEST 1V PORT
2 series · 2 of 2 positions shown · non-contrast
Comparison: 02/27/2016

CLINICAL DATA: Acute respiratory failure, status post line
placement

EXAM:
PORTABLE CHEST 1 VIEW

[AP (1 of 2)]
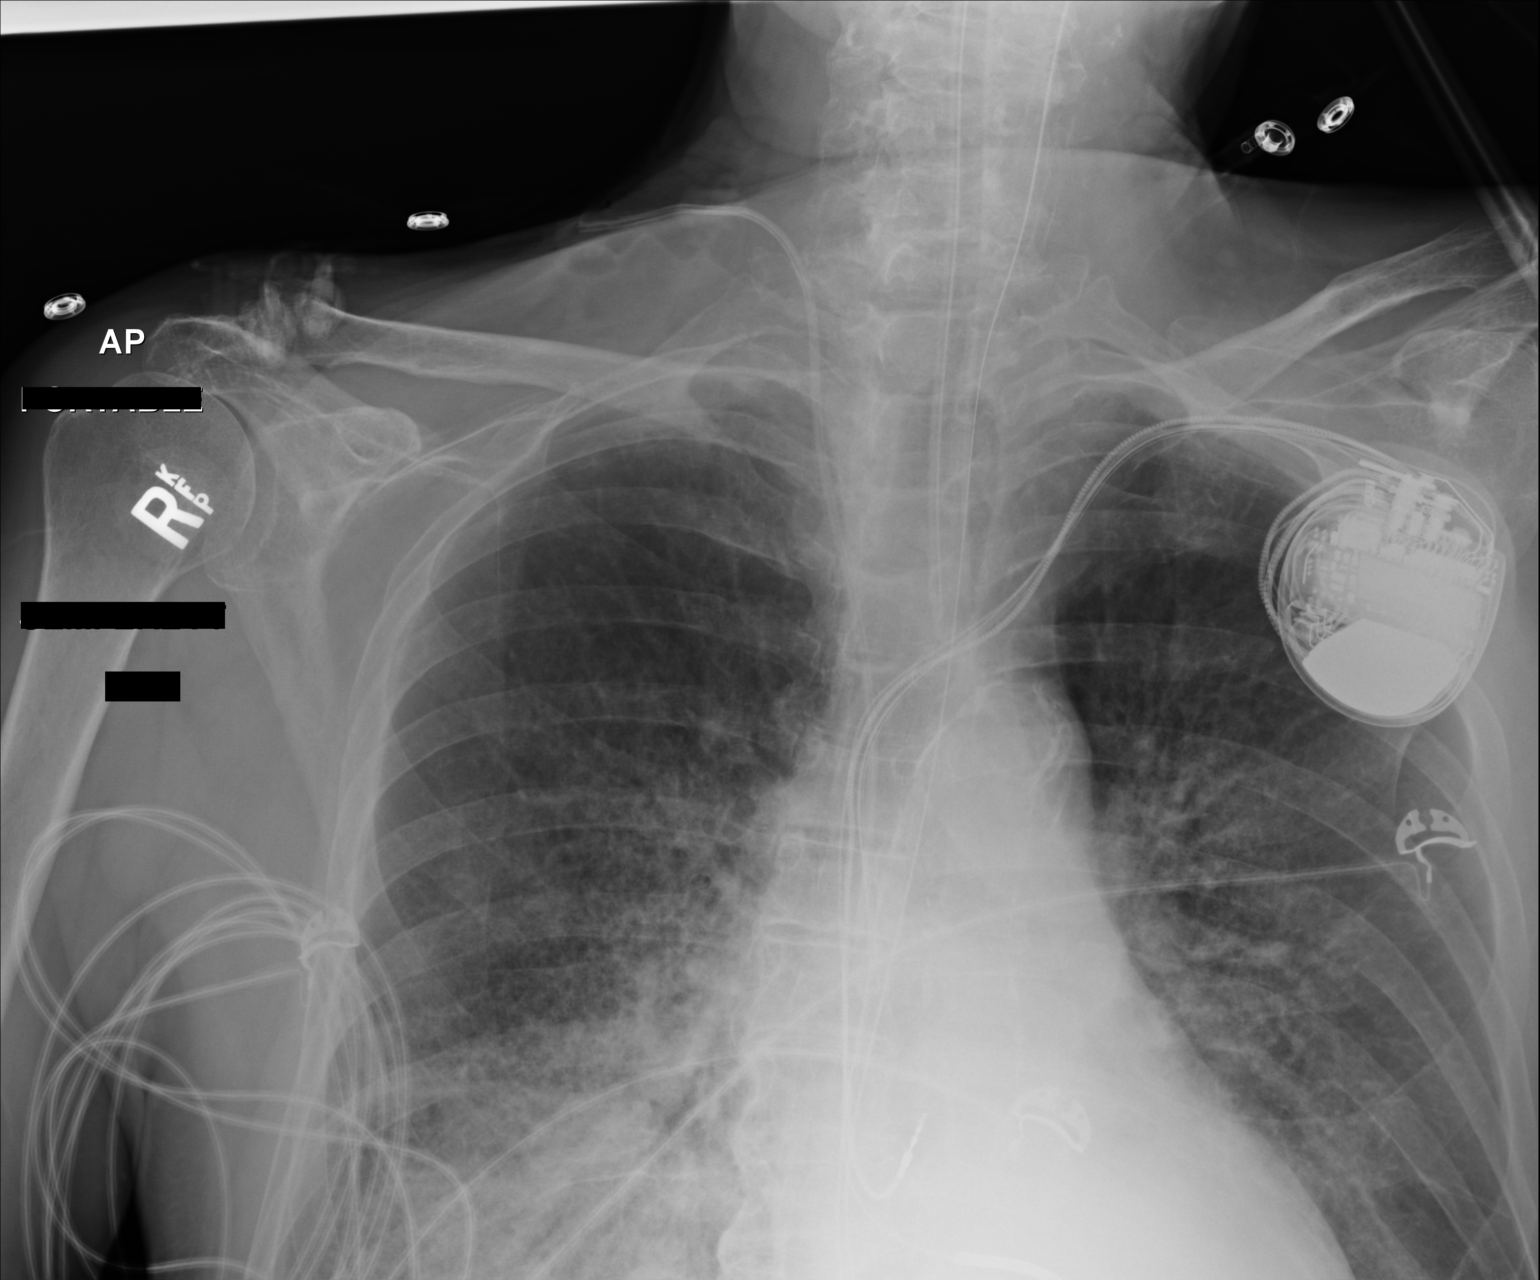

[AP (2 of 2)]
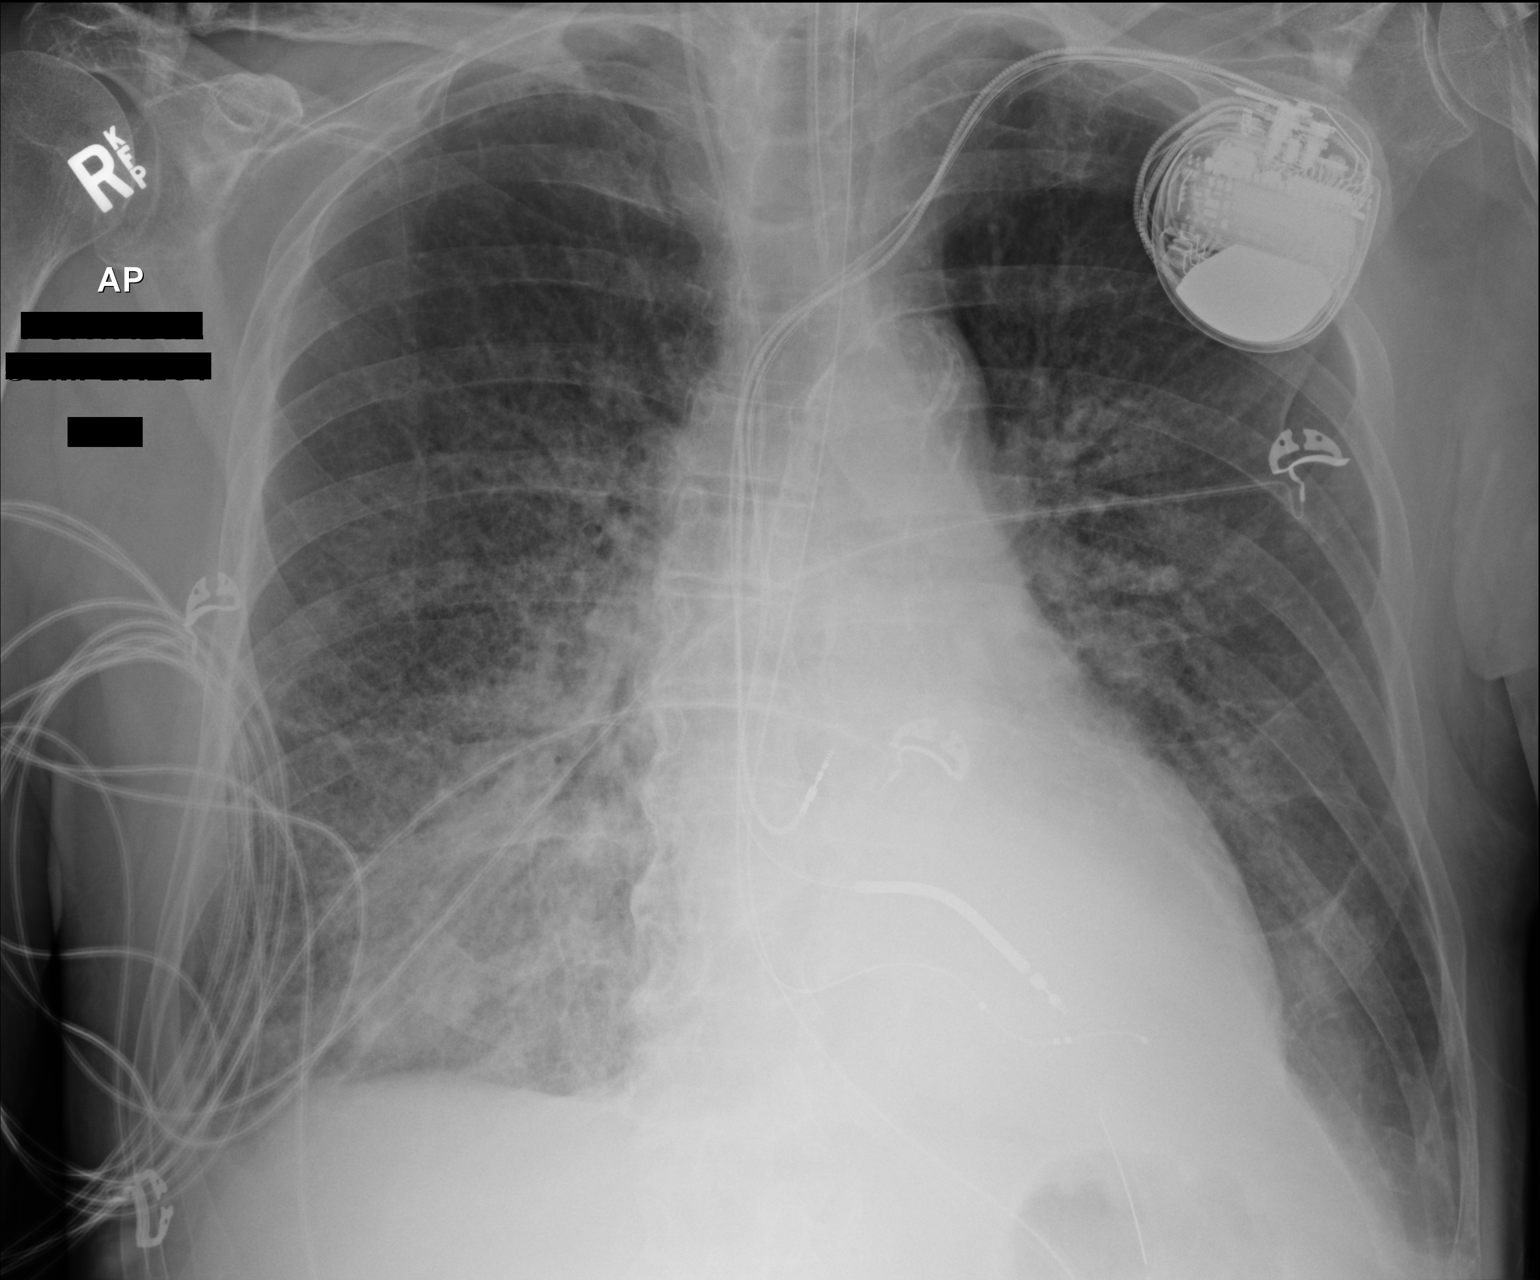

[2 of 2 positions shown; findings below may reference images not displayed]

FINDINGS: Pacing device is again seen and stable. Endotracheal tube,
nasogastric catheter and right jugular central line are noted in
satisfactory position. No pneumothorax is noted. The lungs are well
aerated bilaterally with left retrocardiac consolidation and
significant lower lobe infiltrate on the right. Increased perihilar
changes are noted likely related to vascular congestion and
pulmonary edema. Small pleural effusions are noted bilaterally.
IMPRESSION: Central vascular congestion consistent with CHF and pulmonary edema.
Bilateral lower lobe

Infiltrates are seen worse on the left than the right.

Tubes and lines as described above.  No pneumothorax is noted.

## 2018-08-13 IMAGING — CR DG CHEST 1V PORT
2 series · 2 of 2 positions shown · non-contrast
Comparison: 05/15/2016.

CLINICAL DATA: Pneumonia.

EXAM:
PORTABLE CHEST 1 VIEW

[AP (1 of 2)]
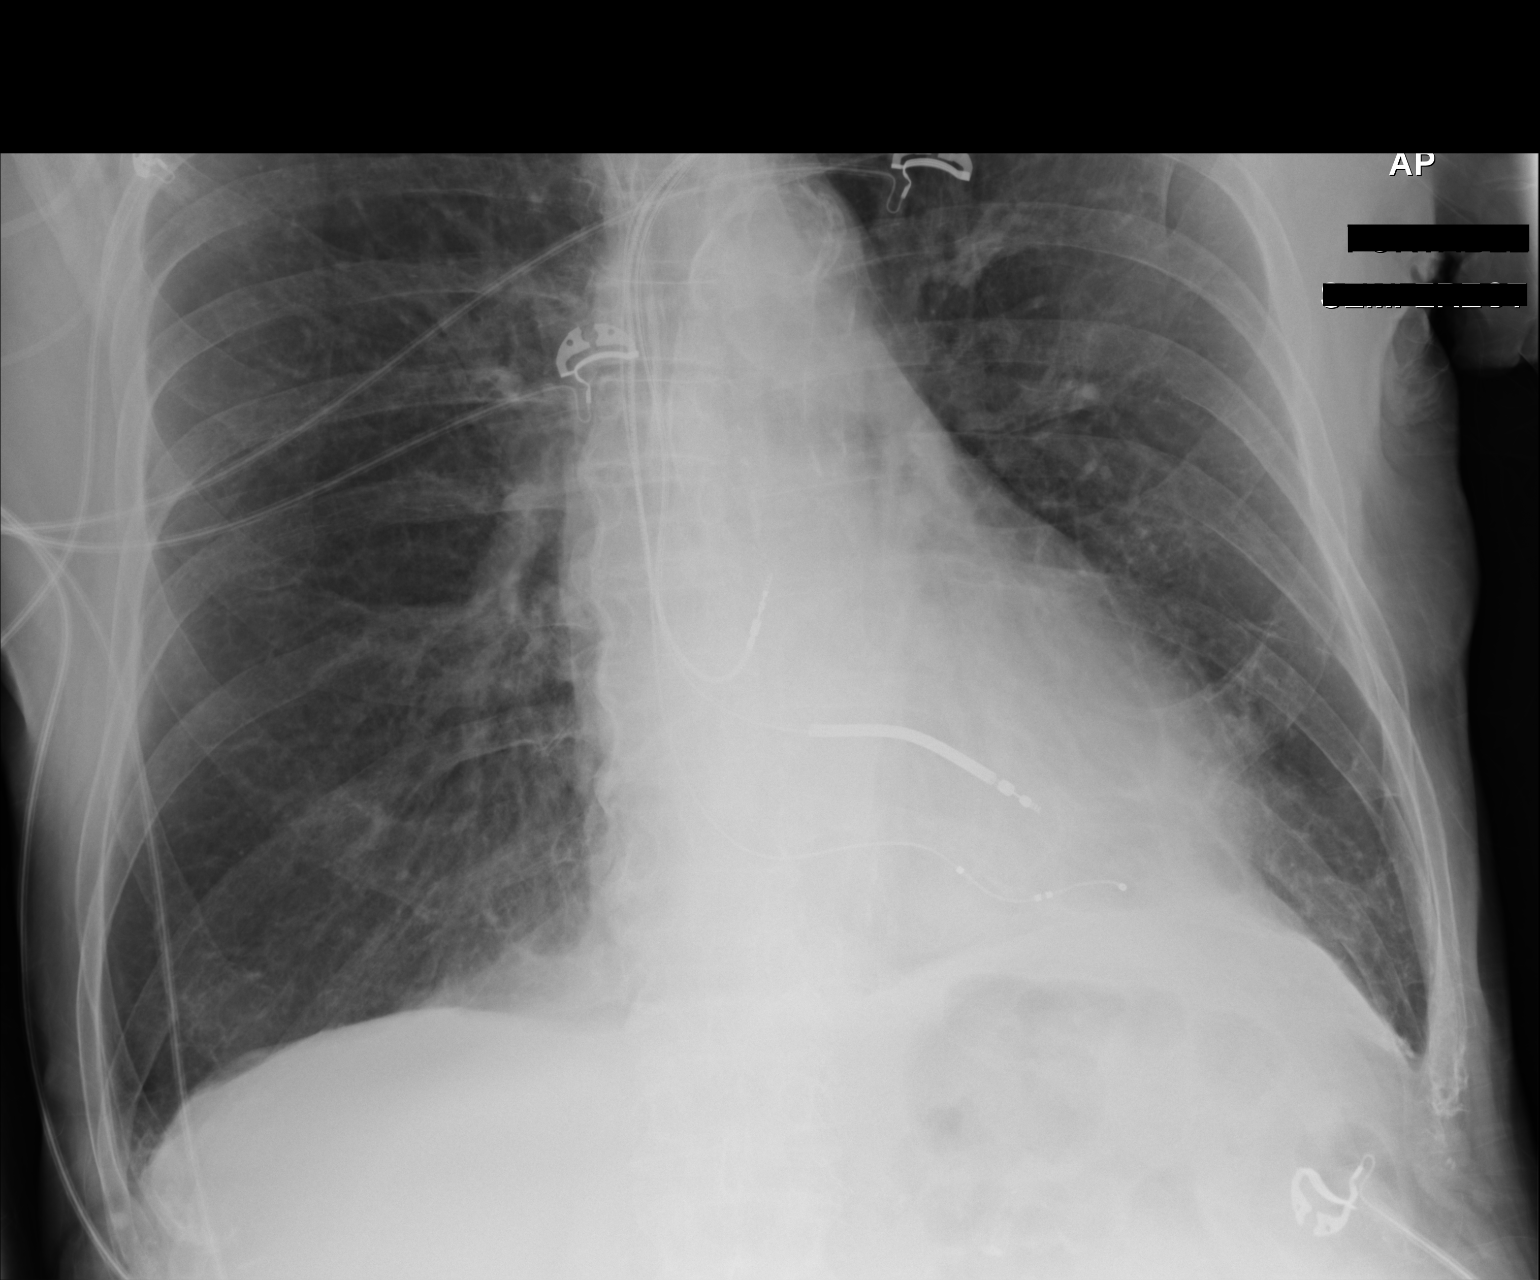

[AP (2 of 2)]
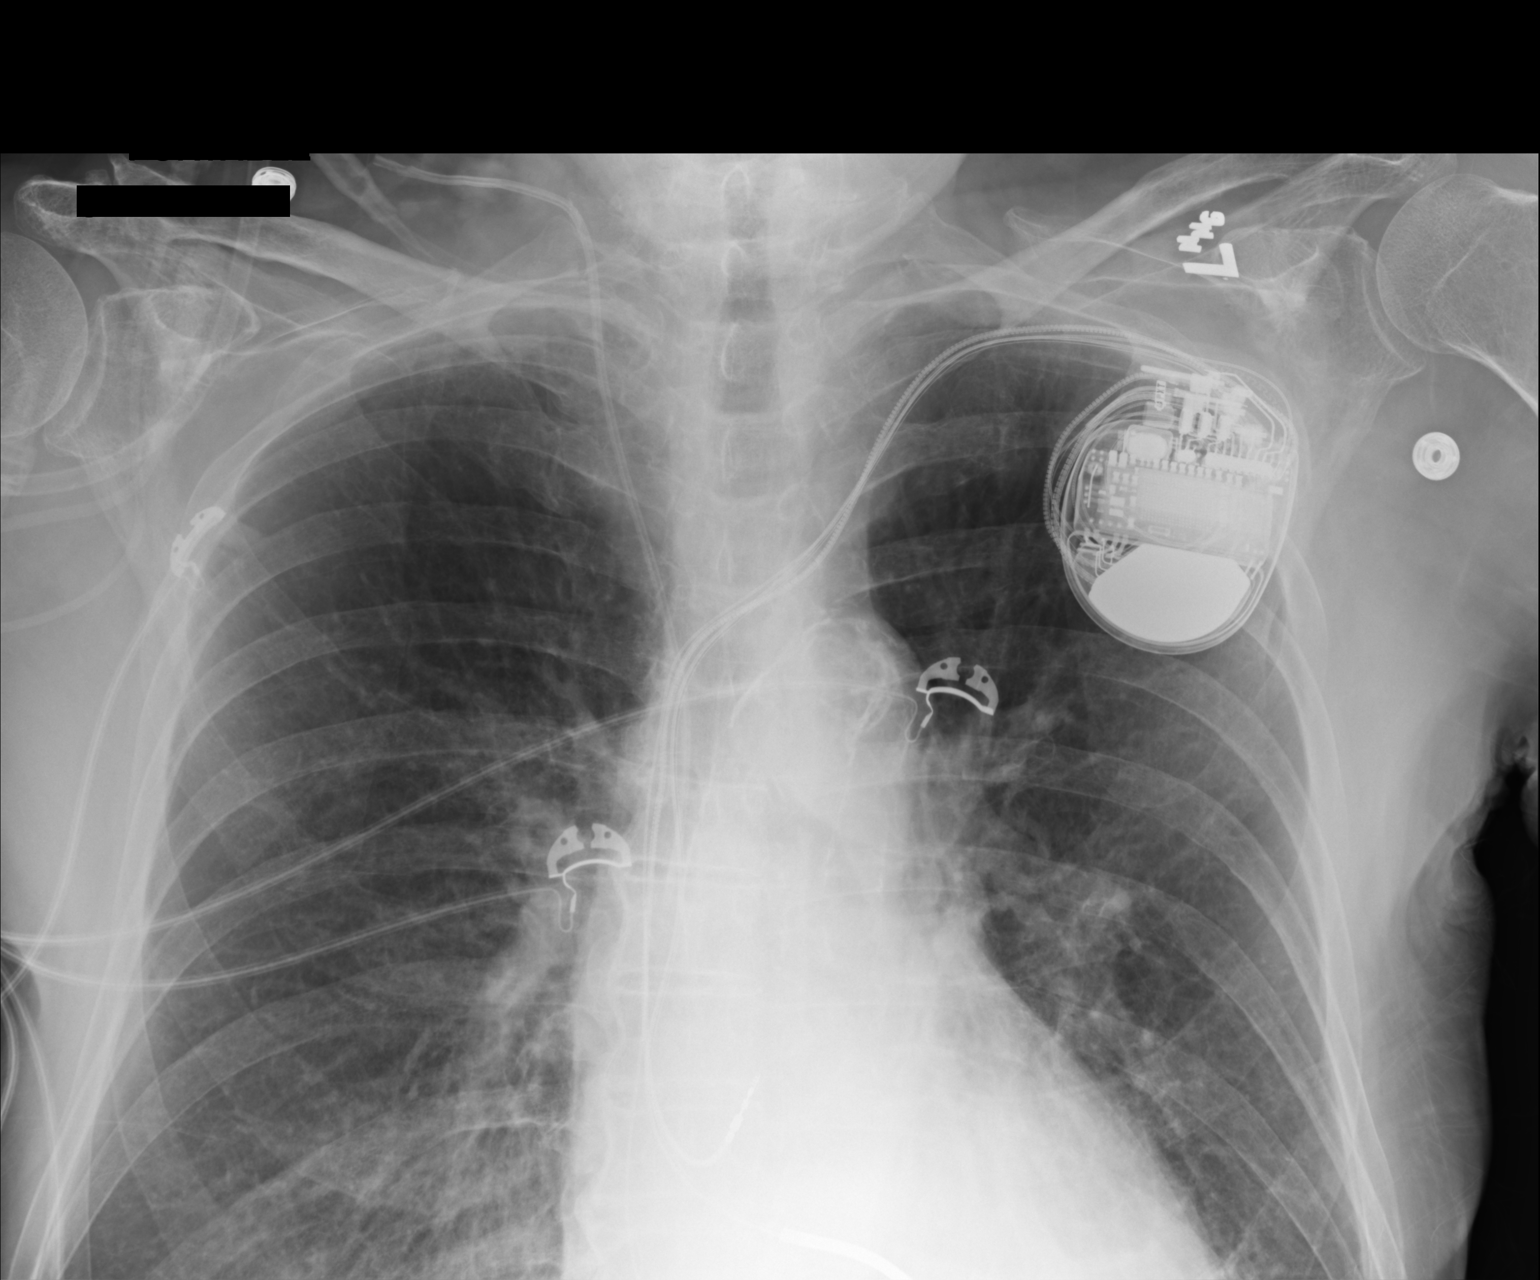

[2 of 2 positions shown; findings below may reference images not displayed]

FINDINGS: Interim extubation and removal of NG tube. Right IJ line stable
position. AICD in stable position. Stable cardiomegaly. Significant
clearing of bibasilar pulmonary infiltrates. COPD. Biapical pleural
thickening noted consistent with scarring. No prominent pleural
effusion. No pneumothorax.
IMPRESSION: 1. Interim extubation removal of NG tube.

2. Significant clearing of bibasilar infiltrates.

3. AICD in stable position.  Stable heart size.
